# Patient Record
Sex: Female | Born: 1955 | Race: White | Hispanic: No | Marital: Single | Smoking: Current every day smoker
Health system: Southern US, Community
[De-identification: ages and names within clinical notes are randomized; demographics above are authoritative.]

## PROBLEM LIST (undated history)

## (undated) DIAGNOSIS — H269 Unspecified cataract: Secondary | ICD-10-CM

## (undated) DIAGNOSIS — F32A Depression, unspecified: Secondary | ICD-10-CM

## (undated) DIAGNOSIS — R011 Cardiac murmur, unspecified: Secondary | ICD-10-CM

## (undated) DIAGNOSIS — T7840XA Allergy, unspecified, initial encounter: Secondary | ICD-10-CM

## (undated) DIAGNOSIS — M797 Fibromyalgia: Secondary | ICD-10-CM

## (undated) DIAGNOSIS — N6019 Diffuse cystic mastopathy of unspecified breast: Secondary | ICD-10-CM

## (undated) DIAGNOSIS — M1990 Unspecified osteoarthritis, unspecified site: Secondary | ICD-10-CM

## (undated) DIAGNOSIS — K219 Gastro-esophageal reflux disease without esophagitis: Secondary | ICD-10-CM

## (undated) DIAGNOSIS — Z87442 Personal history of urinary calculi: Secondary | ICD-10-CM

## (undated) DIAGNOSIS — I10 Essential (primary) hypertension: Secondary | ICD-10-CM

## (undated) DIAGNOSIS — F329 Major depressive disorder, single episode, unspecified: Secondary | ICD-10-CM

## (undated) DIAGNOSIS — F419 Anxiety disorder, unspecified: Secondary | ICD-10-CM

## (undated) DIAGNOSIS — J45909 Unspecified asthma, uncomplicated: Secondary | ICD-10-CM

## (undated) DIAGNOSIS — R5382 Chronic fatigue, unspecified: Secondary | ICD-10-CM

## (undated) DIAGNOSIS — Z1331 Encounter for screening for depression: Secondary | ICD-10-CM

## (undated) HISTORY — DX: Chronic fatigue, unspecified: R53.82

## (undated) HISTORY — DX: Depression, unspecified: F32.A

## (undated) HISTORY — PX: SPINE SURGERY: SHX786

## (undated) HISTORY — DX: Essential (primary) hypertension: I10

## (undated) HISTORY — DX: Unspecified asthma, uncomplicated: J45.909

## (undated) HISTORY — DX: Allergy, unspecified, initial encounter: T78.40XA

## (undated) HISTORY — DX: Fibromyalgia: M79.7

## (undated) HISTORY — DX: Unspecified osteoarthritis, unspecified site: M19.90

## (undated) HISTORY — DX: Diffuse cystic mastopathy of unspecified breast: N60.19

## (undated) HISTORY — DX: Encounter for screening for depression: Z13.31

## (undated) HISTORY — DX: Gastro-esophageal reflux disease without esophagitis: K21.9

## (undated) HISTORY — PX: TYMPANOSTOMY TUBE PLACEMENT: SHX32

## (undated) HISTORY — DX: Major depressive disorder, single episode, unspecified: F32.9

## (undated) HISTORY — DX: Unspecified cataract: H26.9

## (undated) HISTORY — PX: BACK SURGERY: SHX140

## (undated) HISTORY — PX: CHOLECYSTECTOMY: SHX55

## (undated) HISTORY — PX: BREAST BIOPSY: SHX20

## (undated) HISTORY — PX: NASAL SINUS SURGERY: SHX719

## (undated) HISTORY — PX: OTHER SURGICAL HISTORY: SHX169

## (undated) HISTORY — PX: ABDOMINAL HYSTERECTOMY: SHX81

---

## 2003-10-07 ENCOUNTER — Encounter
Admission: RE | Admit: 2003-10-07 | Discharge: 2004-01-05 | Payer: Self-pay | Admitting: Physical Medicine and Rehabilitation

## 2004-01-29 ENCOUNTER — Encounter
Admission: RE | Admit: 2004-01-29 | Discharge: 2004-04-19 | Payer: Self-pay | Admitting: Physical Medicine and Rehabilitation

## 2004-02-02 ENCOUNTER — Ambulatory Visit: Payer: Self-pay | Admitting: Anesthesiology

## 2004-02-16 ENCOUNTER — Ambulatory Visit: Payer: Self-pay | Admitting: Physical Medicine and Rehabilitation

## 2004-04-10 HISTORY — PX: COLONOSCOPY: SHX174

## 2004-04-19 ENCOUNTER — Encounter
Admission: RE | Admit: 2004-04-19 | Discharge: 2004-07-18 | Payer: Self-pay | Admitting: Physical Medicine and Rehabilitation

## 2004-05-06 ENCOUNTER — Ambulatory Visit: Payer: Self-pay | Admitting: Physical Medicine and Rehabilitation

## 2004-09-13 ENCOUNTER — Ambulatory Visit: Payer: Self-pay | Admitting: General Surgery

## 2005-09-28 ENCOUNTER — Ambulatory Visit: Payer: Self-pay | Admitting: General Surgery

## 2006-10-09 ENCOUNTER — Ambulatory Visit: Payer: Self-pay | Admitting: General Surgery

## 2007-02-15 ENCOUNTER — Emergency Department: Payer: Self-pay | Admitting: Emergency Medicine

## 2007-10-10 ENCOUNTER — Ambulatory Visit: Payer: Self-pay | Admitting: General Surgery

## 2008-10-13 ENCOUNTER — Ambulatory Visit: Payer: Self-pay | Admitting: General Surgery

## 2009-04-10 HISTORY — PX: EYE SURGERY: SHX253

## 2009-10-19 ENCOUNTER — Ambulatory Visit: Payer: Self-pay | Admitting: General Surgery

## 2009-10-25 ENCOUNTER — Ambulatory Visit: Payer: Self-pay | Admitting: Ophthalmology

## 2009-11-29 ENCOUNTER — Ambulatory Visit: Payer: Self-pay | Admitting: Ophthalmology

## 2010-08-05 ENCOUNTER — Ambulatory Visit: Payer: Self-pay | Admitting: Specialist

## 2010-08-15 ENCOUNTER — Other Ambulatory Visit (HOSPITAL_COMMUNITY): Payer: Self-pay

## 2010-08-23 ENCOUNTER — Inpatient Hospital Stay (HOSPITAL_COMMUNITY): Payer: Medicare Other

## 2010-08-23 ENCOUNTER — Inpatient Hospital Stay (HOSPITAL_COMMUNITY)
Admission: RE | Admit: 2010-08-23 | Discharge: 2010-08-25 | DRG: 460 | Disposition: A | Discharge status: Home / Self Care | Payer: Medicare Other | Source: Ambulatory Visit | Attending: Neurosurgery | Admitting: Neurosurgery

## 2010-08-23 DIAGNOSIS — M47817 Spondylosis without myelopathy or radiculopathy, lumbosacral region: Secondary | ICD-10-CM | POA: Diagnosis present

## 2010-08-23 DIAGNOSIS — F329 Major depressive disorder, single episode, unspecified: Secondary | ICD-10-CM | POA: Diagnosis present

## 2010-08-23 DIAGNOSIS — M5126 Other intervertebral disc displacement, lumbar region: Principal | ICD-10-CM | POA: Diagnosis present

## 2010-08-23 DIAGNOSIS — F3289 Other specified depressive episodes: Secondary | ICD-10-CM | POA: Diagnosis present

## 2010-08-23 DIAGNOSIS — F17200 Nicotine dependence, unspecified, uncomplicated: Secondary | ICD-10-CM | POA: Diagnosis present

## 2010-08-23 DIAGNOSIS — Z79899 Other long term (current) drug therapy: Secondary | ICD-10-CM

## 2010-08-23 DIAGNOSIS — IMO0001 Reserved for inherently not codable concepts without codable children: Secondary | ICD-10-CM | POA: Diagnosis present

## 2010-08-23 LAB — CBC
HCT: 53.4 % — ABNORMAL HIGH (ref 36.0–46.0)
Hemoglobin: 19.7 g/dL — ABNORMAL HIGH (ref 12.0–15.0)
MCH: 33.6 pg (ref 26.0–34.0)
MCHC: 36.9 g/dL — ABNORMAL HIGH (ref 30.0–36.0)
MCV: 91.1 fL (ref 78.0–100.0)
Platelets: 233 10*3/uL (ref 150–400)
RBC: 5.86 MIL/uL — ABNORMAL HIGH (ref 3.87–5.11)
RDW: 13 % (ref 11.5–15.5)
WBC: 9.9 10*3/uL (ref 4.0–10.5)

## 2010-08-23 LAB — SURGICAL PCR SCREEN
MRSA, PCR: NEGATIVE
Staphylococcus aureus: NEGATIVE

## 2010-08-23 LAB — TYPE AND SCREEN
ABO/RH(D): A POS
Antibody Screen: NEGATIVE

## 2010-08-23 LAB — ABO/RH
ABO/RH(D): A POS
Weak D: POSITIVE

## 2010-09-08 NOTE — Op Note (Signed)
NAME:  April Price, April Price            ACCOUNT NO.:  [id number]  MEDICAL RECORD NO.:  [id number]           PATIENT TYPE:  I  LOCATION:  3025                         FACILITY:  [facility]  PHYSICIAN:  Reinaldo Meeker, M.D. DATE OF BIRTH:  [birth year]  DATE OF PROCEDURE:  08/23/2010 DATE OF DISCHARGE:                              OPERATIVE REPORT   PREOPERATIVE DIAGNOSIS:  Herniated disk and spondylosis, L5-S1.  POSTOPERATIVE DIAGNOSIS:  Herniated disk and spondylosis, L5-S1.  PROCEDURE: 1. Left L5-S1 microdiskectomy followed by left L5-S1 transverse lumbar     interbody fusion followed by left pedicle screw instrumentation     followed by left L5-S1 posterolateral fusion followed by right L5-     S1 percutaneous pedicle screw fixation. 2. Decompression of L5 and S1 nerve roots more so than needed for     transverse lumbar interbody fusion.  SURGEON:  Reinaldo Meeker, MD  ASSISTANT:  Kathaleen Maser. Pool, MD.  PROCEDURE IN DETAIL:  After placing in the prone position, the patient's back was prepped and draped in the usual sterile fashion.  Localizing fluoroscopy was used prior to incision to identify the appropriate level.  Midline incision was made above the spinous processes of L5 and S1.  Using Bovie cutting current, the incision was carried down the spinous processes.  Subperiosteal dissection then carried out on the left-sided spinous processes and lamina, facet joints, far lateral region to identify the transverse process of L5 and the far lateral aspect of the sacrum.  X-rays showed approach to the appropriate level. A self-retaining retractor was placed for exposure.  On the patient's left side, laminotomy was performed by removing basically the entire inferior L5 lamina on that side which was free floating due to the spondylosis.  Generous medial facetectomy was then performed at the superior facet of S1 and then the superior third of the S1 segment were removed.  Ligamentum flavum  was removed in piecemeal fashion.  L5 and S1 nerve roots were both identified and followed out their foramen.  At this time, microdiskectomy was carried on this side.  A huge disk herniation was noted beneath the nerve root.  This was incised and thoroughly cleaned out with pituitary rongeurs and curettes until the disk space was clean.  Thorough decompression was carried out.  At the same time, good care was taken to avoid injury to the neural elements. This was successfully done.  At this time, open pedicle screw fixation was carried out on the patient's left side at L5-S1.  Drill hole entry points were placed followed by passing of a pedicle awl followed by tapping with the 5.5-mm tap and placing a 6.5- x 40-mm screw with L5 and 6.5- x 30-mm screw at S1.  These were followed in good position under AP and lateral fluoroscopy.  Prior to attaching the rod on this side, we did the interbody fusion.  We prepared the disk with a variety of instruments to decorticate.  Prior to placing the interbody spacer of a 10-mm size, impacted EquivaBone and autologous bone deep within the interspace for interbody fusion.  We then packed the cage without difficulty and fluoroscopy  showed it to be in good position.  We then decorticated the far lateral region on the left at L5-S1, placed a mixture of autologous bone and EquivaBone for posterolateral fusion.  We then attached the rod, secured it to the top of the screws with torque and counter-torque for the final tightening.  Fluoroscopy showed these screws to be in excellent position along with the interbody device.  We then irrigated the wound copiously and placed Gelfoam over the dura but did not close it thoroughly.  We then placed percutaneous pedicle screws on the patient's right side.  We used entry points just lateral to the pedicle at L5 and S1, passed Jamshidi needle through the mid pedicle from a lateral to medial direction.  We then placed a  guidewire to remove the Jamshidi needle.  The guidewires were placed.  We attached the two incisions, carried it down through the fascia.  Starting at S1, we did sequential dilation and then tapped over the guidewire using the 5.5-mm tap.  We then placed a 6.5- x 35-mm screw at the S1 segment on the right.  We then did similar procedure at L5.  At this level, we placed a 6.5- x 40-mm screw with the towers attached.  We then passed a 35-mm rod down through the towers, secured it to the top of the top- loading nuts.  Then, did torque and counter-torque to remove the towers. Final fluoroscopy showed excellent placing of the screws, rods, interbody device on AP and lateral fluoroscopy.  We irrigated the wound once more on the patient's left side and then closed all wounds with interrupted Vicryl on the muscle, fascia, subcutaneous, and subcuticular tissues and placed staples on the skin.  Sterile dressings were then applied.  The patient was extubated, taken to recovery room in stable condition.          ______________________________ Reinaldo Meeker, M.D.     ROK/MEDQ  D:  08/23/2010  T:  08/24/2010  Job:  [id number]  Electronically Signed by Aliene Beams M.D. on 09/08/2010 05:02:02 PM

## 2010-10-03 ENCOUNTER — Ambulatory Visit
Admission: RE | Admit: 2010-10-03 | Discharge: 2010-10-03 | Disposition: A | Discharge status: Home / Self Care | Payer: Medicare Other | Source: Ambulatory Visit | Attending: Neurosurgery | Admitting: Neurosurgery

## 2010-10-03 ENCOUNTER — Other Ambulatory Visit: Payer: Self-pay | Admitting: Neurosurgery

## 2010-10-03 DIAGNOSIS — M47817 Spondylosis without myelopathy or radiculopathy, lumbosacral region: Secondary | ICD-10-CM

## 2010-10-03 DIAGNOSIS — M5126 Other intervertebral disc displacement, lumbar region: Secondary | ICD-10-CM

## 2010-10-24 ENCOUNTER — Ambulatory Visit: Payer: Self-pay | Admitting: General Surgery

## 2010-11-21 ENCOUNTER — Other Ambulatory Visit: Payer: Self-pay | Admitting: Neurosurgery

## 2010-11-21 ENCOUNTER — Ambulatory Visit
Admission: RE | Admit: 2010-11-21 | Discharge: 2010-11-21 | Disposition: A | Discharge status: Home / Self Care | Payer: Medicare Other | Source: Ambulatory Visit | Attending: Neurosurgery | Admitting: Neurosurgery

## 2010-11-21 DIAGNOSIS — M5126 Other intervertebral disc displacement, lumbar region: Secondary | ICD-10-CM

## 2010-11-21 DIAGNOSIS — Q762 Congenital spondylolisthesis: Secondary | ICD-10-CM

## 2010-11-21 DIAGNOSIS — M533 Sacrococcygeal disorders, not elsewhere classified: Secondary | ICD-10-CM

## 2011-01-04 ENCOUNTER — Emergency Department: Payer: Self-pay | Admitting: *Deleted

## 2011-01-12 ENCOUNTER — Ambulatory Visit (INDEPENDENT_AMBULATORY_CARE_PROVIDER_SITE_OTHER): Payer: Medicare Other | Admitting: Family Medicine

## 2011-01-12 ENCOUNTER — Encounter: Payer: Self-pay | Admitting: Family Medicine

## 2011-01-12 VITALS — BP 167/93 | HR 117 | Temp 98.2°F | Ht 62.0 in | Wt 167.0 lb

## 2011-01-12 DIAGNOSIS — Z23 Encounter for immunization: Secondary | ICD-10-CM

## 2011-01-12 DIAGNOSIS — R030 Elevated blood-pressure reading, without diagnosis of hypertension: Secondary | ICD-10-CM

## 2011-01-12 DIAGNOSIS — M797 Fibromyalgia: Secondary | ICD-10-CM | POA: Insufficient documentation

## 2011-01-12 DIAGNOSIS — J309 Allergic rhinitis, unspecified: Secondary | ICD-10-CM

## 2011-01-12 DIAGNOSIS — G8929 Other chronic pain: Secondary | ICD-10-CM

## 2011-01-12 DIAGNOSIS — F329 Major depressive disorder, single episode, unspecified: Secondary | ICD-10-CM

## 2011-01-12 DIAGNOSIS — F4320 Adjustment disorder, unspecified: Secondary | ICD-10-CM

## 2011-01-12 DIAGNOSIS — J302 Other seasonal allergic rhinitis: Secondary | ICD-10-CM

## 2011-01-12 DIAGNOSIS — F17200 Nicotine dependence, unspecified, uncomplicated: Secondary | ICD-10-CM

## 2011-01-12 DIAGNOSIS — F32A Depression, unspecified: Secondary | ICD-10-CM

## 2011-01-12 DIAGNOSIS — IMO0001 Reserved for inherently not codable concepts without codable children: Secondary | ICD-10-CM

## 2011-01-12 DIAGNOSIS — Z9109 Other allergy status, other than to drugs and biological substances: Secondary | ICD-10-CM

## 2011-01-12 DIAGNOSIS — M549 Dorsalgia, unspecified: Secondary | ICD-10-CM

## 2011-01-12 DIAGNOSIS — Z889 Allergy status to unspecified drugs, medicaments and biological substances status: Secondary | ICD-10-CM

## 2011-01-12 DIAGNOSIS — R5382 Chronic fatigue, unspecified: Secondary | ICD-10-CM

## 2011-01-12 MED ORDER — AMBULATORY NON FORMULARY MEDICATION: 1.0000 | Freq: Once | Status: DC

## 2011-01-12 MED ORDER — FENTANYL 25 MCG/HR TD PT72: 1.0000 | MEDICATED_PATCH | TRANSDERMAL | Status: DC

## 2011-01-12 MED ORDER — TRAZODONE HCL 300 MG PO TABS: 300.0000 mg | ORAL_TABLET | Freq: Every day | ORAL | Status: DC

## 2011-01-12 MED ORDER — TRAMADOL HCL 50 MG PO TABS: ORAL_TABLET | ORAL | Status: DC

## 2011-01-12 MED ORDER — BUDESONIDE 32 MCG/ACT NA SUSP: 1.0000 | Freq: Every day | NASAL | Status: DC

## 2011-01-12 MED ORDER — CARISOPRODOL 350 MG PO TABS: 350.0000 mg | ORAL_TABLET | Freq: Four times a day (QID) | ORAL | Status: DC | PRN

## 2011-01-12 MED ORDER — EPINEPHRINE 0.15 MG/0.3ML IJ DEVI: 0.1500 mg | INTRAMUSCULAR | Status: DC | PRN

## 2011-01-12 NOTE — Patient Instructions (Signed)
Patient will be started on trazodone and titrated  Continue w/psychiatric conciling Return in 2-4 for follow up  Return next month for triggerpoint injections Stop smoking  Make sure flu shot is given at usual place and zooster vaccination given Renewal of tramadol ,soma and duragesic

## 2011-01-13 ENCOUNTER — Telehealth: Payer: Self-pay | Admitting: Family Medicine

## 2011-01-13 DIAGNOSIS — Z889 Allergy status to unspecified drugs, medicaments and biological substances status: Secondary | ICD-10-CM | POA: Insufficient documentation

## 2011-01-13 DIAGNOSIS — F329 Major depressive disorder, single episode, unspecified: Secondary | ICD-10-CM | POA: Insufficient documentation

## 2011-01-13 DIAGNOSIS — F419 Anxiety disorder, unspecified: Secondary | ICD-10-CM | POA: Insufficient documentation

## 2011-01-13 DIAGNOSIS — F4320 Adjustment disorder, unspecified: Secondary | ICD-10-CM | POA: Insufficient documentation

## 2011-01-13 DIAGNOSIS — F32A Depression, unspecified: Secondary | ICD-10-CM | POA: Insufficient documentation

## 2011-01-13 NOTE — Telephone Encounter (Signed)
I am not sure what is the concern since she had been on vicoprophen for a number of years but when she had back surgery this spring all antiinflammatories were stopped by her neurosurgeon,. She was on dilaudid I believe filled by me and the neurosurgeon. Check w/MS Marlyne Beards please but I have no problem w/ the duragesic.

## 2011-01-13 NOTE — Telephone Encounter (Signed)
Cordelia Pen called from [employer] and said pt insurance is going to require a prior auth on pt rhinocort Aqua medication.  They need our fax number to send prior auth form. Plan:  Notified Cordelia Pen and fax number given and will fax form so we can work on prior auth for the pt. Jarvis Newcomer, LPN [facility]

## 2011-01-13 NOTE — Telephone Encounter (Signed)
[facility] called stating they have not filled any pain meds for Pt since May and would not fill duragesic patches bc she is no longer opiate dependant. I called and verified w/ Pt and she stated that she had not been on these meds in months bc she had back surgery in June. Please advise.

## 2011-01-13 NOTE — Progress Notes (Signed)
Subjective:    Patient ID: April Price, female    DOB: 05/05/1955, 55 y.o.   MRN: [id number]  HPI #1 Fibromyalgia/and chronic fatigue  Over the years we have followed her for this illness She is on tramadol Soma,and fentanyl patch. Due to her recent back surgery her mobic has been stopped. #2 depression #3 situation disturbances Her life has been under a lot of stress due to her adopted daughter mental illness and recent recurrent hospitalizations. She has stated that she was going to be released as guardian by the court but has not done it yet. She has had symptoms of depression and difficulty sleeping and reports only a 3 hrs-4 hrs sleep time. #4 Recent back surgery #5 Recent severe reaction to penicillin requiring strarting prednisone and zantac. Her epipen was renewed then. #5 Immunization needs. She gets her flu vaccine at her drug store but wants the zooster vaccine as well. Review of Systems  Respiratory: Negative for chest tightness and shortness of breath.   Cardiovascular: Negative for chest pain.  Musculoskeletal: Positive for back pain, joint swelling and gait problem.  Psychiatric/Behavioral: Positive for sleep disturbance, dysphoric mood and decreased concentration. Negative for self-injury. The patient is nervous/anxious.        BP 167/93  Pulse 117  Temp(Src) 98.2 F (36.8 C) (Oral)  Ht 5\' 2"  (1.575 m)  Wt 167 lb (75.751 kg)  BMI 30.54 kg/m2  SpO2 96% Allergies  Allergen Reactions  . Penicillins   . Tetanus Toxoids   . Tylenol (Acetaminophen)    History   Social History  . Marital Status: Single    Spouse Name: N/A    Number of Children: N/A  . Years of Education: N/A   Occupational History  . Not on file.   Social History Main Topics  . Smoking status: Current Everyday Smoker -- 30 years    Types: Cigarettes  . Smokeless tobacco: Not on file  . Alcohol Use: No  . Drug Use: No  . Sexually Active: Not on file   Other Topics Concern  . Not on  file   Social History Narrative  . No narrative on file     Objective:   Physical Exam  Constitutional: She is oriented to person, place, and time. She appears well-developed and well-nourished.  HENT:  Head: Normocephalic.  Neck: Neck supple.  Cardiovascular: Normal rate, regular rhythm and normal heart sounds.   Pulmonary/Chest: Effort normal. No respiratory distress. She has wheezes.  Musculoskeletal: Normal range of motion.       Healing surgical scar presen over lower back  Neurological: She is alert and oriented to person, place, and time. She has normal reflexes.  Skin: Skin is warm and dry. No erythema.  Psychiatric:       Tearful at times          Assessment & Plan:  Patient will be started on trazodone and titrated  Continue w/psychiatric conciling Return in 2-4 for follow up  Return next month for triggerpoint injections Stop smoking  Make sure flu shot is given at usual place and zooster vaccination given Renewal of tramadol ,soma and duragesic patch Will need to recheck BP next visit Scrip for Varicella given  Outpatient Encounter Prescriptions as of 01/12/2011  Medication Sig Dispense Refill  . budesonide (RHINOCORT AQUA) 32 MCG/ACT nasal spray Place 1 spray into the nose daily.  1 Bottle  6  . carisoprodol (SOMA) 350 MG tablet Take 1 tablet (350 mg total) by mouth  4 (four) times daily as needed.  120 tablet  5  . clindamycin (CLEOCIN) 300 MG capsule Take 300 mg by mouth every 8 (eight) hours.        Marland Kitchen EPINEPHrine (EPIPEN JR) 0.15 MG/0.3ML injection Inject 0.3 mLs (0.15 mg total) into the muscle as needed.  1 each  6  . fentaNYL (DURAGESIC - DOSED MCG/HR) 25 MCG/HR Place 1 patch (25 mcg total) onto the skin every 3 (three) days.  10 patch  0  . predniSONE (DELTASONE) 10 MG tablet Take 10 mg by mouth as directed.        . ranitidine (ZANTAC) 150 MG tablet Take 150 mg by mouth 2 (two) times daily.        . traMADol (ULTRAM) 50 MG tablet 1-2 tablets po q 8hrs  prn  120 tablet  6  . DISCONTD: budesonide (RHINOCORT AQUA) 32 MCG/ACT nasal spray Place 1 spray into the nose daily.        Marland Kitchen DISCONTD: carisoprodol (SOMA) 350 MG tablet Take 350 mg by mouth 4 (four) times daily as needed.        Marland Kitchen DISCONTD: EPINEPHrine (EPIPEN JR) 0.15 MG/0.3ML injection Inject 0.15 mg into the muscle as needed.        Marland Kitchen DISCONTD: fentaNYL (DURAGESIC - DOSED MCG/HR) 25 MCG/HR Place 1 patch onto the skin every 3 (three) days.        Marland Kitchen DISCONTD: traMADol (ULTRAM) 50 MG tablet Take 50 mg by mouth every 8 (eight) hours as needed.        . AMBULATORY NON FORMULARY MEDICATION Inject 1 vial as directed once. Medication Name: zooster   1 vial  0  . trazodone (DESYREL) 300 MG tablet Take 1 tablet (300 mg total) by mouth at bedtime. Start off w/ a third of a tablet and increase by 1/3 every 2 weeks as needed  30 tablet  6

## 2011-01-26 NOTE — Telephone Encounter (Signed)
Looks like this message regarding pain med was sent to Payton Spark, CMA. Jarvis Newcomer, LPN [facility]

## 2011-01-31 ENCOUNTER — Encounter: Payer: Self-pay | Admitting: Family Medicine

## 2011-01-31 ENCOUNTER — Ambulatory Visit (INDEPENDENT_AMBULATORY_CARE_PROVIDER_SITE_OTHER): Payer: Medicare Other | Admitting: Family Medicine

## 2011-01-31 VITALS — BP 153/91 | HR 98 | Ht 62.5 in | Wt 169.0 lb

## 2011-01-31 DIAGNOSIS — M549 Dorsalgia, unspecified: Secondary | ICD-10-CM

## 2011-01-31 DIAGNOSIS — M542 Cervicalgia: Secondary | ICD-10-CM

## 2011-01-31 DIAGNOSIS — G8929 Other chronic pain: Secondary | ICD-10-CM

## 2011-01-31 DIAGNOSIS — M797 Fibromyalgia: Secondary | ICD-10-CM

## 2011-01-31 DIAGNOSIS — M62830 Muscle spasm of back: Secondary | ICD-10-CM | POA: Insufficient documentation

## 2011-01-31 DIAGNOSIS — R030 Elevated blood-pressure reading, without diagnosis of hypertension: Secondary | ICD-10-CM

## 2011-01-31 DIAGNOSIS — M5380 Other specified dorsopathies, site unspecified: Secondary | ICD-10-CM

## 2011-01-31 DIAGNOSIS — IMO0001 Reserved for inherently not codable concepts without codable children: Secondary | ICD-10-CM

## 2011-01-31 MED ORDER — FENTANYL 25 MCG/HR TD PT72: 1.0000 | MEDICATED_PATCH | TRANSDERMAL | Status: DC

## 2011-01-31 NOTE — Progress Notes (Signed)
  Subjective:    Patient ID: April Price, female    DOB: December 01, 1955, 55 y.o.   MRN: [id number]  Back Pain This is a chronic problem. The current episode started more than 1 year ago. The problem occurs constantly. The problem has been gradually worsening (despiyte surgery but no trigger point injections) since onset. The pain is present in the lumbar spine and sacro-iliac. The quality of the pain is described as stabbing. The pain does not radiate. The pain is at a severity of 7/10. The pain is moderate. The pain is the same all the time. The symptoms are aggravated by bending, coughing, position, standing, twisting and stress. Stiffness is present all day. Associated symptoms include headaches, pelvic pain, tingling and weakness. Pertinent negatives include no chest pain. Risk factors include sedentary lifestyle, menopause, obesity, history of steroid use and lack of exercise. She has tried chiropractic manipulation, muscle relaxant, ice, heat, analgesics and bed rest (unable t use NSAIDS  1 yea after surgery due to grafts) for the symptoms. The treatment provided moderate relief.  Trazadone 300 not effective w/sleep yet but she is only on 1/2  Patient has had chronic back pain she has used all of her fentynel patches and any extra dilaudid pills the last 2 weeks. She could not get the patch filled because of concern w/opite naivety. She has assured me that she has ben using what pain medication she has at home. But she uses something daily.  Now since she is able to drive post surgery she finds that the dilaudid makes her sleepy while the fentynel does not. Review of Systems  Cardiovascular: Negative for chest pain.  Genitourinary: Positive for pelvic pain.  Musculoskeletal: Positive for back pain and arthralgias.  Neurological: Positive for tingling, weakness and headaches.       Tingling feet and nubness has continued post back surgery  All other systems reviewed and are negative.   BP  153/91 p98 HT 5 2.5 W169   Objective:   Physical Exam  Constitutional: She is oriented to person, place, and time. She appears well-developed and well-nourished.  Cardiovascular: Normal rate and regular rhythm.   Musculoskeletal: She exhibits tenderness (tenderness over C spine and upper back muscles as well as lower back).  Neurological: She is alert and oriented to person, place, and time.  Skin: Skin is warm and dry.          Assessment & Plan:  She feels elevated BP is due to stress Will have her monitor and track her BP Will renew fentynel patch 25 mcg q 3  day

## 2011-01-31 NOTE — Patient Instructions (Signed)
Back Pain, Adult Low back pain is very common. About 1 in 5 people have back pain.The cause of low back pain is rarely dangerous. The pain often gets better over time.About half of people with a sudden onset of back pain feel better in just 2 weeks. About 8 in 10 people feel better by 6 weeks.  CAUSES Some common causes of back pain include:  Strain of the muscles or ligaments supporting the spine.   Wear and tear (degeneration) of the spinal discs.   Arthritis.   Direct injury to the back.  DIAGNOSIS Most of the time, the direct cause of low back pain is not known.However, back pain can be treated effectively even when the exact cause of the pain is unknown.Answering your caregiver's questions about your overall health and symptoms is one of the most accurate ways to make sure the cause of your pain is not dangerous. If your caregiver needs more information, he or she may order lab work or imaging tests (X-rays or MRIs).However, even if imaging tests show changes in your back, this usually does not require surgery. HOME CARE INSTRUCTIONS For many people, back pain returns.Since low back pain is rarely dangerous, it is often a condition that people can learn to manageon their own.   Remain active. It is stressful on the back to sit or stand in one place. Do not sit, drive, or stand in one place for more than 30 minutes at a time. Take short walks on level surfaces as soon as pain allows.Try to increase the length of time you walk each day.   Do not stay in bed.Resting more than 1 or 2 days can delay your recovery.   Do not avoid exercise or work.Your body is made to move.It is not dangerous to be active, even though your back may hurt.Your back will likely heal faster if you return to being active before your pain is gone.   Pay attention to your body when you bend and lift. Many people have less discomfortwhen lifting if they bend their knees, keep the load close to their  bodies,and avoid twisting. Often, the most comfortable positions are those that put less stress on your recovering back.   Find a comfortable position to sleep. Use a firm mattress and lie on your side with your knees slightly bent. If you lie on your back, put a pillow under your knees.   Only take over-the-counter or prescription medicines as directed by your caregiver. Over-the-counter medicines to reduce pain and inflammation are often the most helpful.Your caregiver may prescribe muscle relaxant drugs.These medicines help dull your pain so you can more quickly return to your normal activities and healthy exercise.   Put ice on the injured area.   Put ice in a plastic bag.   Place a towel between your skin and the bag.   Leave the ice on for 15 to 20 minutes, 3 to 4 times a day for the first 2 to 3 days. After that, ice and heat may be alternated to reduce pain and spasms.   Ask your caregiver about trying back exercises and gentle massage. This may be of some benefit.   Avoid feeling anxious or stressed.Stress increases muscle tension and can worsen back pain.It is important to recognize when you are anxious or stressed and learn ways to manage it.Exercise is a great option.  SEEK MEDICAL CARE IF:  You have pain that is not relieved with rest or medicine.   You have   pain that does not improve in 1 week.   You have new symptoms.   You are generally not feeling well.  SEEK IMMEDIATE MEDICAL CARE IF:   You have pain that radiates from your back into your legs.   You develop new bowel or bladder control problems.   You have unusual weakness or numbness in your arms or legs.   You develop nausea or vomiting.   You develop abdominal pain.   You feel faint.  Document Released: 03/27/2005 Document Revised: 12/07/2010 Document Reviewed: 08/15/2010 [facility] Patient Information 2012 [place], [place]. Fibromyalgia Fibromyalgia is a disorder that is often misunderstood. It is  associated with muscular pains and tenderness that comes and goes. It is often associated with fatigue and sleep disturbances. Though it tends to be long-lasting, fibromyalgia is not life-threatening. CAUSES  The exact cause of fibromyalgia is unknown. People with certain gene types are predisposed to developing fibromyalgia and other conditions. Certain factors can play a role as triggers, such as: Spine disorders.  Arthritis.  Severe injury (trauma) and other physical stressors.  Emotional stressors.  SYMPTOMS  The main symptom is pain and stiffness in the muscles and joints, which can vary over time.  Sleep and fatigue problems.  Other related symptoms may include: Bowel and bladder problems.  Headaches.  Visual problems.  Problems with odors and noises.  Depression or mood changes.  Painful periods (dysmenorrhea).  Dryness of the skin or eyes.  DIAGNOSIS  There are no specific tests for diagnosing fibromyalgia. Patients can be diagnosed accurately from the specific symptoms they have. The diagnosis is made by determining that nothing else is causing the problems. TREATMENT  There is no cure. Management includes medicines and an active, healthy lifestyle. The goal is to enhance physical fitness, decrease pain, and improve sleep. HOME CARE INSTRUCTIONS  Only take over-the-counter or prescription medicines as directed by your caregiver. Sleeping pills, tranquilizers, and pain medicines may make your problems worse.  Low-impact aerobic exercise is very important and advised for treatment. At first, it may seem to make pain worse. Gradually increasing your tolerance will overcome this feeling.  Learning relaxation techniques and how to control stress will help you. Biofeedback, visual imagery, hypnosis, muscle relaxation, yoga, and meditation are all options.  Anti-inflammatory medicines and physical therapy may provide short-term help.  Acupuncture or massage treatments may help.  Take  muscle relaxant medicines as suggested by your caregiver.  Avoid stressful situations.  Plan a healthy lifestyle. This includes your diet, sleep, rest, exercise, and friends.  Find and practice a hobby you enjoy.  Join a fibromyalgia support group for interaction, ideas, and sharing advice. This may be helpful.  SEEK MEDICAL CARE IF:  You are not having good results or improvement from your treatment. FOR MORE INFORMATION  National Fibromyalgia Association: www.fmaware.org Arthritis Foundation: www.arthritis.org Document Released: 03/27/2005 Document Revised: 12/07/2010 Document Reviewed: 07/07/2009 [facility] Patient Information 2012 [place], [place].   Smoking Cessation This document explains the best ways for you to quit smoking and new treatments to help. It lists new medicines that can double or triple your chances of quitting and quitting for good. It also considers ways to avoid relapses and concerns you may have about quitting, including weight gain. NICOTINE: A POWERFUL ADDICTION If you have tried to quit smoking, you know how hard it can be. It is hard because nicotine is a very addictive drug. For some people, it can be as addictive as heroin or cocaine. Usually, people make 2 or 3  tries, or more, before finally being able to quit. Each time you try to quit, you can learn about what helps and what hurts. Quitting takes hard work and a lot of effort, but you can quit smoking. QUITTING SMOKING IS ONE OF THE MOST IMPORTANT THINGS YOU WILL EVER DO.  You will live longer, feel better, and live better.   The impact on your body of quitting smoking is felt almost immediately:   Within 20 minutes, blood pressure decreases. Pulse returns to its normal level.   After 8 hours, carbon monoxide levels in the blood return to normal. Oxygen level increases.   After 24 hours, chance of heart attack starts to decrease. Breath, hair, and body stop smelling like smoke.   After 48 hours, damaged  nerve endings begin to recover. Sense of taste and smell improve.   After 72 hours, the body is virtually free of nicotine. Bronchial tubes relax and breathing becomes easier.   After 2 to 12 weeks, lungs can hold more air. Exercise becomes easier and circulation improves.   Quitting will reduce your risk of having a heart attack, stroke, cancer, or lung disease:   After 1 year, the risk of coronary heart disease is cut in half.   After 5 years, the risk of stroke falls to the same as a nonsmoker.   After 10 years, the risk of lung cancer is cut in half and the risk of other cancers decreases significantly.   After 15 years, the risk of coronary heart disease drops, usually to the level of a nonsmoker.   If you are pregnant, quitting smoking will improve your chances of having a healthy baby.   The people you live with, especially your children, will be healthier.   You will have extra money to spend on things other than cigarettes.  FIVE KEYS TO QUITTING Studies have shown that these 5 steps will help you quit smoking and quit for good. You have the best chances of quitting if you use them together: 1. Get ready.  2. Get support and encouragement.  3. Learn new skills and behaviors.  4. Get medicine to reduce your nicotine addiction and use it correctly.  5. Be prepared for relapse or difficult situations. Be determined to continue trying to quit, even if you do not succeed at first.  1. GET READY  Set a quit date.   Change your environment.   Get rid of ALL cigarettes, ashtrays, matches, and lighters in your home, car, and place of work.   Do not let people smoke in your home.   Review your past attempts to quit. Think about what worked and what did not.   Once you quit, do not smoke. NOT EVEN A PUFF!  2. GET SUPPORT AND ENCOURAGEMENT Studies have shown that you have a better chance of being successful if you have help. You can get support in many ways.  Tell your  family, friends, and coworkers that you are going to quit and need their support. Ask them not to smoke around you.   Talk to your caregivers (doctor, dentist, nurse, pharmacist, psychologist, and/or smoking counselor).   Get individual, group, or telephone counseling and support. The more counseling you have, the better your chances are of quitting. Programs are available at [employer] and health centers. Call your local health department for information about programs in your area.   Spiritual beliefs and practices may help some smokers quit.   Quit meters are small computer  programs online or downloadable that keep track of quit statistics, such as amount of "quit-time," cigarettes not smoked, and money saved.   Many smokers find one or more of the many self-help books available useful in helping them quit and stay off tobacco.  3. LEARN NEW SKILLS AND BEHAVIORS  Try to distract yourself from urges to smoke. Talk to someone, go for a walk, or occupy your time with a task.   When you first try to quit, change your routine. Take a different route to work. Drink tea instead of coffee. Eat breakfast in a different place.   Do something to reduce your stress. Take a hot bath, exercise, or read a book.   Plan something enjoyable to do every day. Reward yourself for not smoking.   Explore interactive web-based programs that specialize in helping you quit.  4. GET MEDICINE AND USE IT CORRECTLY Medicines can help you stop smoking and decrease the urge to smoke. Combining medicine with the above behavioral methods and support can quadruple your chances of successfully quitting smoking. The U.[place]. Food and Drug Administration (FDA) has approved 7 medicines to help you quit smoking. These medicines fall into 3 categories.  Nicotine replacement therapy (delivers nicotine to your body without the negative effects and risks of smoking):   Nicotine gum: Available over-the-counter.   Nicotine  lozenges: Available over-the-counter.   Nicotine inhaler: Available by prescription.   Nicotine nasal spray: Available by prescription.   Nicotine skin patches (transdermal): Available by prescription and over-the-counter.   Antidepressant medicine (helps people abstain from smoking, but how this works is unknown):   Bupropion sustained-release (SR) tablets: Available by prescription.   Nicotinic receptor partial agonist (simulates the effect of nicotine in your brain):   Varenicline tartrate tablets: Available by prescription.   Ask your caregiver for advice about which medicines to use and how to use them. Carefully read the information on the package.   Everyone who is trying to quit may benefit from using a medicine. If you are pregnant or trying to become pregnant, nursing an infant, you are under age 34, or you smoke fewer than 10 cigarettes per day, talk to your caregiver before taking any nicotine replacement medicines.   You should stop using a nicotine replacement product and call your caregiver if you experience nausea, dizziness, weakness, vomiting, fast or irregular heartbeat, mouth problems with the lozenge or gum, or redness or swelling of the skin around the patch that does not go away.   Do not use any other product containing nicotine while using a nicotine replacement product.   Talk to your caregiver before using these products if you have diabetes, heart disease, asthma, stomach ulcers, you had a recent heart attack, you have high blood pressure that is not controlled with medicine, a history of irregular heartbeat, or you have been prescribed medicine to help you quit smoking.  5. BE PREPARED FOR RELAPSE OR DIFFICULT SITUATIONS  Most relapses occur within the first 3 months after quitting. Do not be discouraged if you start smoking again. Remember, most people try several times before they finally quit.   You may have symptoms of withdrawal because your body is used  to nicotine. You may crave cigarettes, be irritable, feel very hungry, cough often, get headaches, or have difficulty concentrating.   The withdrawal symptoms are only temporary. They are strongest when you first quit, but they will go away within 10 to 14 days.  Here are some difficult situations to watch  for:  Alcohol. Avoid drinking alcohol. Drinking lowers your chances of successfully quitting.   Caffeine. Try to reduce the amount of caffeine you consume. It also lowers your chances of successfully quitting.   Other smokers. Being around smoking can make you want to smoke. Avoid smokers.   Weight gain. Many smokers will gain weight when they quit, usually less than 10 pounds. Eat a healthy diet and stay active. Do not let weight gain distract you from your main goal, quitting smoking. Some medicines that help you quit smoking may also help delay weight gain. You can always lose the weight gained after you quit.   Bad mood or depression. There are a lot of ways to improve your mood other than smoking.  If you are having problems with any of these situations, talk to your caregiver. SPECIAL SITUATIONS AND CONDITIONS Studies suggest that everyone can quit smoking. Your situation or condition can give you a special reason to quit.  Pregnant women/new mothers: By quitting, you protect your baby's health and your own.   Hospitalized patients: By quitting, you reduce health problems and help healing.   Heart attack patients: By quitting, you reduce your risk of a second heart attack.   Lung, head, and neck cancer patients: By quitting, you reduce your chance of a second cancer.   Parents of children and adolescents: By quitting, you protect your children from illnesses caused by secondhand smoke.  QUESTIONS TO THINK ABOUT Think about the following questions before you try to stop smoking. You may want to talk about your answers with your caregiver.  Why do you want to quit?   If you  tried to quit in the past, what helped and what did not?   What will be the most difficult situations for you after you quit? How will you plan to handle them?   Who can help you through the tough times? Your family? Friends? Caregiver?   What pleasures do you get from smoking? What ways can you still get pleasure if you quit?  Here are some questions to ask your caregiver:  How can you help me to be successful at quitting?   What medicine do you think would be best for me and how should I take it?   What should I do if I need more help?   What is smoking withdrawal like? How can I get information on withdrawal?  Quitting takes hard work and a lot of effort, but you can quit smoking. FOR MORE INFORMATION  Smokefree.gov (http://www.davis-sullivan.com/) provides free, accurate, evidence-based information and professional assistance to help support the immediate and long-term needs of people trying to quit smoking. Document Released: 03/21/2001 Document Revised: 12/07/2010 Document Reviewed: 01/11/2009 [facility] Patient Information 2012 [place], [place].  Chronic Fatigue Syndrome Chronic Fatigue Syndrome is characterized by extreme fatigue that does not improve with rest. The cause of this condition is unknown.  SYMPTOMS  An unexplained dramatic loss of energy.   Muscle or joint soreness.   Severe weakness.   Frequent headaches.   Fever, sore throat, and swollen lymph glands.   Sleep problems.   Inability to concentrate.  Symptoms must usually be present for over 6 months before the diagnosis of chronic fatigue can be made. There is no diagnostic test for this disease. Many other diseases can cause similar symptoms. A complete medical evaluation is needed to be sure you do not have other medical problems causing your symptoms. There is no specific treatment for Chronic Fatigue Syndrome. Cognitive  behavioral therapy (similar to counseling) and/or a simple exercise regimen may be  beneficial. Get plenty of rest and avoid alcohol and other depressant drugs. Call your caregiver for follow up care as recommended. Document Released: 05/04/2004 Document Revised: 12/07/2010 Document Reviewed: 06/26/2008 [facility] Patient Information 2012 [place], [place].

## 2011-01-31 NOTE — Assessment & Plan Note (Signed)
Patient has had chronic back pain. She has been using what few fentyl p

## 2011-02-16 ENCOUNTER — Ambulatory Visit (INDEPENDENT_AMBULATORY_CARE_PROVIDER_SITE_OTHER): Payer: Medicare Other | Admitting: Family Medicine

## 2011-02-16 ENCOUNTER — Encounter: Payer: Self-pay | Admitting: Family Medicine

## 2011-02-16 VITALS — BP 133/85 | HR 92 | Ht 62.5 in | Wt 172.0 lb

## 2011-02-16 DIAGNOSIS — M797 Fibromyalgia: Secondary | ICD-10-CM

## 2011-02-16 DIAGNOSIS — Z23 Encounter for immunization: Secondary | ICD-10-CM

## 2011-02-16 DIAGNOSIS — IMO0001 Reserved for inherently not codable concepts without codable children: Secondary | ICD-10-CM

## 2011-02-16 NOTE — Patient Instructions (Signed)
Patient will return in 2 months for repeat injections

## 2011-02-16 NOTE — Progress Notes (Signed)
  Subjective:    Patient ID: April Price, female    DOB: 06-Nov-1955, 55 y.o.   MRN: [id number]  HPI Patient's here for trigger point injections she does have a history of fibromyalgia. #2 need for flu vaccination  Review of Systems    patient has unusual persistent and recurrent joint pain muscle aches and fatigue from her chronic fatigue. Objective:   Physical Exam  Constitutional: She is oriented to person, place, and time. She appears well-developed and well-nourished.  HENT:  Head: Normocephalic.  Neck:       Multiple triggerpoints on both sides and back and a thoracic and cervical spine.  Musculoskeletal: Normal range of motion.       Multiple trigger points over her back and neck  Neurological: She is alert and oriented to person, place, and time.  Skin: Skin is warm and dry.  Psychiatric: Her mood appears anxious. She exhibits a depressed mood.   BP 133/85  Pulse 92  Ht 5' 2.5" (1.588 m)  Wt 172 lb (78.019 kg)  BMI 30.96 kg/m2  SpO2 96%    Procedure note over several groups of muscles more than 5. Multiple injections were made using 2% Xylocaine with epinephrine and a Marcaine like agent and Toradol. Total of 30 mg of Toradol was added to 15 mL's of the 2% lidocaine and 15 ml of the Marcaine agent. Between 45 ML's and 60 mls were injected into the triggerpoints over her back marked improvement. Area was cleaned with Betadine and injection done under sterile technique patient procedure quite well with good results.    Assessment & Plan:  Return in 2 months for followup and possible repeat injection. #2 flu vaccine was also given today.

## 2011-04-18 DIAGNOSIS — F331 Major depressive disorder, recurrent, moderate: Secondary | ICD-10-CM | POA: Diagnosis not present

## 2011-04-20 ENCOUNTER — Telehealth: Payer: Self-pay | Admitting: Family Medicine

## 2011-04-20 ENCOUNTER — Ambulatory Visit (INDEPENDENT_AMBULATORY_CARE_PROVIDER_SITE_OTHER): Payer: Medicare Other | Admitting: Family Medicine

## 2011-04-20 ENCOUNTER — Encounter: Payer: Self-pay | Admitting: Family Medicine

## 2011-04-20 DIAGNOSIS — F329 Major depressive disorder, single episode, unspecified: Secondary | ICD-10-CM

## 2011-04-20 DIAGNOSIS — R5382 Chronic fatigue, unspecified: Secondary | ICD-10-CM

## 2011-04-20 DIAGNOSIS — F938 Other childhood emotional disorders: Secondary | ICD-10-CM

## 2011-04-20 DIAGNOSIS — F32A Depression, unspecified: Secondary | ICD-10-CM

## 2011-04-20 DIAGNOSIS — M62830 Muscle spasm of back: Secondary | ICD-10-CM

## 2011-04-20 DIAGNOSIS — G8929 Other chronic pain: Secondary | ICD-10-CM

## 2011-04-20 DIAGNOSIS — M5380 Other specified dorsopathies, site unspecified: Secondary | ICD-10-CM

## 2011-04-20 DIAGNOSIS — M797 Fibromyalgia: Secondary | ICD-10-CM

## 2011-04-20 DIAGNOSIS — M549 Dorsalgia, unspecified: Secondary | ICD-10-CM

## 2011-04-20 DIAGNOSIS — F4320 Adjustment disorder, unspecified: Secondary | ICD-10-CM

## 2011-04-20 MED ORDER — TRAZODONE HCL 150 MG PO TABS: 300.0000 mg | ORAL_TABLET | Freq: Every day | ORAL | Status: DC

## 2011-04-20 MED ORDER — FENTANYL 25 MCG/HR TD PT72: 1.0000 | MEDICATED_PATCH | TRANSDERMAL | Status: DC

## 2011-04-20 MED ORDER — BUSPIRONE HCL 15 MG PO TABS: 15.0000 mg | ORAL_TABLET | Freq: Two times a day (BID) | ORAL | Status: DC | PRN

## 2011-04-20 MED ORDER — TIZANIDINE HCL 4 MG PO TABS: 4.0000 mg | ORAL_TABLET | Freq: Three times a day (TID) | ORAL | Status: DC

## 2011-04-20 NOTE — Patient Instructions (Signed)

## 2011-04-23 NOTE — Assessment & Plan Note (Signed)
Patient had her Duragesic patches renewed with a second prescription for March as well.  Because of her insurance company rules and formulary changes she was placed on a new muscle relaxer zenaflex and soma was stopped.

## 2011-04-23 NOTE — Assessment & Plan Note (Signed)
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

## 2011-04-23 NOTE — Progress Notes (Signed)
Subjective:     Patient ID: April Price, female   DOB: 09/11/1955, 56 y.o.   MRN: [id number]  HPI Fibromyalgia follow up L thigh bursitis Chronic back pain requiring trigger point injetions.  situalional disturbeb=nces Review of Systems See above BP 139/75  Pulse 95  Temp(Src) 98.2 F (36.8 C) (Oral)  Ht 5\' 4"  (1.626 m)  Wt 175 lb (79.379 kg)  BMI 30.04 kg/m2  SpO2 94%    Objective:   Physical Exam Tender bursa in upper L thigh Multiple trigger points on both sides of the back and neck

## 2011-04-23 NOTE — Assessment & Plan Note (Addendum)
The depression and insomnia which was being treated w/300 mg of trazadone also had to be changed due to insurance formulary to 150 at night.

## 2011-05-02 DIAGNOSIS — F331 Major depressive disorder, recurrent, moderate: Secondary | ICD-10-CM | POA: Diagnosis not present

## 2011-05-02 NOTE — Telephone Encounter (Signed)
No phone call or message noted.

## 2011-05-23 DIAGNOSIS — F331 Major depressive disorder, recurrent, moderate: Secondary | ICD-10-CM | POA: Diagnosis not present

## 2011-06-13 DIAGNOSIS — F331 Major depressive disorder, recurrent, moderate: Secondary | ICD-10-CM | POA: Diagnosis not present

## 2011-06-20 ENCOUNTER — Ambulatory Visit (INDEPENDENT_AMBULATORY_CARE_PROVIDER_SITE_OTHER): Payer: Medicare Other | Admitting: Family Medicine

## 2011-06-20 ENCOUNTER — Encounter: Payer: Self-pay | Admitting: Family Medicine

## 2011-06-20 VITALS — BP 153/89 | HR 99 | Ht 64.0 in | Wt 172.0 lb

## 2011-06-20 DIAGNOSIS — M62830 Muscle spasm of back: Secondary | ICD-10-CM

## 2011-06-20 DIAGNOSIS — J3089 Other allergic rhinitis: Secondary | ICD-10-CM

## 2011-06-20 DIAGNOSIS — M549 Dorsalgia, unspecified: Secondary | ICD-10-CM

## 2011-06-20 DIAGNOSIS — J302 Other seasonal allergic rhinitis: Secondary | ICD-10-CM

## 2011-06-20 DIAGNOSIS — IMO0001 Reserved for inherently not codable concepts without codable children: Secondary | ICD-10-CM

## 2011-06-20 DIAGNOSIS — M797 Fibromyalgia: Secondary | ICD-10-CM

## 2011-06-20 DIAGNOSIS — F17200 Nicotine dependence, unspecified, uncomplicated: Secondary | ICD-10-CM

## 2011-06-20 DIAGNOSIS — G8929 Other chronic pain: Secondary | ICD-10-CM

## 2011-06-20 DIAGNOSIS — Z720 Tobacco use: Secondary | ICD-10-CM

## 2011-06-20 DIAGNOSIS — J309 Allergic rhinitis, unspecified: Secondary | ICD-10-CM

## 2011-06-20 MED ORDER — FENTANYL 25 MCG/HR TD PT72: 1.0000 | MEDICATED_PATCH | TRANSDERMAL | Status: DC

## 2011-06-20 MED ORDER — NICOTINE 10 MG IN INHA: 1.0000 | RESPIRATORY_TRACT | Status: DC | PRN

## 2011-06-20 MED ORDER — VARENICLINE TARTRATE 0.5 MG PO TABS: 0.5000 mg | ORAL_TABLET | Freq: Two times a day (BID) | ORAL | Status: DC

## 2011-06-20 MED ORDER — MONTELUKAST SODIUM 10 MG PO TABS: 10.0000 mg | ORAL_TABLET | Freq: Every day | ORAL | Status: DC

## 2011-06-20 MED ORDER — VARENICLINE TARTRATE 1 MG PO TABS: 1.0000 mg | ORAL_TABLET | Freq: Two times a day (BID) | ORAL | Status: DC

## 2011-06-20 NOTE — Patient Instructions (Signed)
Smoking Cessation, Tips for Success     YOU CAN QUIT SMOKING   If you are ready to quit smoking, congratulations! You have chosen to help yourself be healthier. Cigarettes bring nicotine, tar, carbon monoxide, and other irritants into your body. Your lungs, heart, and blood vessels will be able to work better without these poisons. There are many different ways to quit smoking. Nicotine gum, nicotine patches, a nicotine inhaler, or nicotine nasal spray can help with physical craving. Hypnosis, support groups, and medicines help break the habit of smoking. Here are some tips to help you quit for good.     . Throw away all cigarettes.   . Clean and remove all ashtrays from your home, work, and car.   . On a card, write down your reasons for quitting. Carry the card with you and read it when you get the urge to smoke.   . Cleanse your body of nicotine. Drink enough water and fluids to keep your urine clear or pale yellow. Do this after quitting to flush the nicotine from your body.   . Learn to predict your moods. Do not let a bad situation be your excuse to have a cigarette. Some situations in your life might tempt you into wanting a cigarette.   . Never have "just one" cigarette. It leads to wanting another and another. Remind yourself of your decision to quit.   . Change habits associated with smoking. If you smoked while driving or when feeling stressed, try other activities to replace smoking. Stand up when drinking your coffee. Brush your teeth after eating. Sit in a different chair when you read the paper. Avoid alcohol while trying to quit, and try to drink fewer caffeinated beverages. Alcohol and caffeine may urge you to smoke.   . Avoid foods and drinks that can trigger a desire to smoke, such as sugary or spicy foods and alcohol.   . Ask people who smoke not to smoke around you.   . Have something planned to do right after eating or having a cup of coffee. Take a walk or exercise to perk you up. This will  help to keep you from overeating.   . Try a relaxation exercise to calm you down and decrease your stress. Remember, you may be tense and nervous for the first 2 weeks after you quit, but this will pass.   . Find new activities to keep your hands busy. Play with a pen, coin, or rubber band. Doodle or draw things on paper.   . Brush your teeth right after eating. This will help cut down on the craving for the taste of tobacco after meals. You can try mouthwash, too.   . Use oral substitutes, such as lemon drops, carrots, a cinnamon stick, or chewing gum, in place of cigarettes. Keep them handy so they are available when you have the urge to smoke.   . When you have the urge to smoke, try deep breathing.   . Designate your home as a nonsmoking area.   . If you are a heavy smoker, ask your caregiver about a prescription for nicotine chewing gum. It can ease your withdrawal from nicotine.   . Reward yourself. Set aside the cigarette money you save and buy yourself something nice.   . Look for support from others. Join a support group or smoking cessation program. Ask someone at home or at work to help you with your plan to quit smoking.   . Always ask   yourself, "Do I need this cigarette or is this just a reflex?" Tell yourself, "Today, I choose not to smoke," or "I do not want to smoke." You are reminding yourself of your decision to quit, even if you do smoke a cigarette.    HOW WILL I FEEL WHEN I QUIT SMOKING?     . The benefits of not smoking start within days of quitting.   . You may have symptoms of withdrawal because your body is used to nicotine (the addictive substance in cigarettes). You may crave cigarettes, be irritable, feel very hungry, cough often, get headaches, or have difficulty concentrating.   . The withdrawal symptoms are only temporary. They are strongest when you first quit but will go away within 10 to 14 days.   . When withdrawal symptoms occur, stay in control. Think about your reasons for  quitting. Remind yourself that these are signs that your body is healing and getting used to being without cigarettes.   . Remember that withdrawal symptoms are easier to treat than the major diseases that smoking can cause.   . Even after the withdrawal is over, expect periodic urges to smoke. However, these cravings are generally short-lived and will go away whether you smoke or not. Do not smoke!   . If you relapse and smoke again, do not lose hope. Most smokers quit 3 times before they are successful.   . If you relapse, do not give up! Plan ahead and think about what you will do the next time you get the urge to smoke.    LIFE AS A NONSMOKER: MAKE IT FOR A MONTH, MAKE IT FOR LIFE     Day 1: Hang this page where you will see it every day.   Day 2: Get rid of all ashtrays, matches, and lighters.   Day 3: Drink water. Breathe deeply between sips.   Day 4: Avoid places with smoke-filled air, such as bars, clubs, or the smoking section of restaurants.   Day 5: Keep track of how much money you save by not smoking.   Day 6: Avoid boredom. Keep a good book with you or go to the movies.   Day 7: Reward yourself! One week without smoking!   Day 8: Make a dental appointment to get your teeth cleaned.   Day 9: Decide how you will turn down a cigarette before it is offered to you.   Day 10: Review your reasons for quitting.   Day 11: Distract yourself. Stay active to keep your mind off smoking and to relieve tension. Take a walk, exercise, read a book, do a crossword puzzle, or try a new hobby.   Day 12: Exercise. Get off the bus before your stop or use stairs instead of escalators.   Day 13: Call on friends for support and encouragement.   Day 14: Reward yourself! Two weeks without smoking!   Day 15: Practice deep breathing exercises.   Day 16: Bet a friend that you can stay a nonsmoker.   Day 17: Ask to sit in nonsmoking sections of restaurants.   Day 18: Hang up "No Smoking" signs.   Day 19: Think of yourself as a  nonsmoker.   Day 20: Each morning, tell yourself you will not smoke.   Day 21: Reward yourself! Three weeks without smoking!   Day 22: Think of smoking in negative ways. Remember how it stains your teeth, gives you bad breath, and leaves you short of breath.   Day   23: Eat a nutritious breakfast.   Day 24:Do not relive your days as a smoker.   Day 25: Hold a pencil in your hand when talking on the telephone.   Day 26: Tell all your friends you do not smoke.   Day 27: Think about how much better food tastes.   Day 28: Remember, one cigarette is one too many.   Day 29: Take up a hobby that will keep your hands busy.   Day 30: Congratulations! One month without smoking! Give yourself a big reward.     Your caregiver can direct you to community resources or hospitals for support, which may include:   . Group support.   . Education.   . Hypnosis.   . Subliminal therapy.      Document Released: 12/24/2003 Document Revised: 03/16/2011 Document Reviewed: 01/11/2009   [employer] Patient Information 2012 [employer].

## 2011-06-20 NOTE — Assessment & Plan Note (Signed)
Trigger Point Injection   Pre-operative diagnosis: fibromyalgia  Post-operative diagnosis: fibromyalgia  After risks and benefits were explained including bleeding, infection, worsening of the pain, damage to the area being injected, weakness, allergic reaction to medications, vascular injection, and nerve damage, signed consent was obtained.  All questions were answered.    The area of the trigger point was identified and the skin prepped three times with betadine and the betadine allowed to dry.  Next, a 25 gauge 1 inch needle was placed in the area of the trigger point.  Once reproduction of the pain was elicited and negative aspiration confirmed, the trigger point was injected and the needle removed.  Over 25 injections were given overer more than 5 muscle groups in the neck ,upper back and L shoulder.  The patient did tolerate the procedure well and there were not complications.    Medication used: total of 25ml of Marcaine,  25 ml of 2% lidocaine w/epinepherine,  and 2 ml of Toradol were mixed and injected. Trigger points injected: greater than 20    Trigger point(s) location(s):  bilateral upper back and neck and R shoulder.

## 2011-06-20 NOTE — Progress Notes (Signed)
  Subjective:    Patient ID: April Price, female    DOB: [birth year], 56 y.o.   MRN: [id number]  Back Pain   Allergies. Patient is taking an over-the-counter allergy medication that is not helping much. She has taken Singulair in the past and now since it is generic she may be able to get coverage for it. #2 fibromyalgia/chronic fatigue. She is here for trigger point injection and she also needs refill of her Duragesic patch. #3 tobacco cessation. She is willing to try Chantix to try to stop smoking. She was backup with Nicotrol inhaler to use as backup. #4 stress disturbance. She has finally been successful in court disassociating from guardianship of her adopted daughter. She's had a great deal of relief by doing that. Review of Systems  Musculoskeletal: Positive for back pain.       She reports the hip that we injected/trigger point has improved great improving her gait and ambulation.  All other systems reviewed and are negative.      BP 153/89  Pulse 99  Ht 5\' 4"  (1.626 m)  Wt 172 lb (78.019 kg)  BMI 29.52 kg/m2  SpO2 94% Objective:   Physical Exam  Constitutional: She is oriented to person, place, and time. She appears well-developed and well-nourished.  HENT:  Head: Normocephalic.  Neurological: She is alert and oriented to person, place, and time.  Skin: Skin is warm and dry.  Psychiatric: She has a normal mood and affect. Her behavior is normal.      Assessment & Plan:  #1 allergies. Singulair 10 mg one by mouth daily. #2 two  prescriptions of Duragesic was written one for April and one from May. Followup in 2 months repeat injections. #3 tobacco counseling. Using Chantix may be a good idea she is ready on an antidepressant Wellbutrin. Discussed side effects and prescriptions were written. As well as a prescription for Nicotrol inhaler. #4 stress disturbance improved.

## 2011-06-27 DIAGNOSIS — F331 Major depressive disorder, recurrent, moderate: Secondary | ICD-10-CM | POA: Diagnosis not present

## 2011-07-11 DIAGNOSIS — F331 Major depressive disorder, recurrent, moderate: Secondary | ICD-10-CM | POA: Diagnosis not present

## 2011-08-09 ENCOUNTER — Other Ambulatory Visit: Payer: Self-pay | Admitting: *Deleted

## 2011-08-09 MED ORDER — TIZANIDINE HCL 4 MG PO TABS: 4.0000 mg | ORAL_TABLET | Freq: Three times a day (TID) | ORAL | Status: DC

## 2011-08-22 DIAGNOSIS — F331 Major depressive disorder, recurrent, moderate: Secondary | ICD-10-CM | POA: Diagnosis not present

## 2011-08-29 ENCOUNTER — Ambulatory Visit (INDEPENDENT_AMBULATORY_CARE_PROVIDER_SITE_OTHER): Payer: Medicare Other | Admitting: Family Medicine

## 2011-08-29 ENCOUNTER — Encounter: Payer: Self-pay | Admitting: Family Medicine

## 2011-08-29 VITALS — BP 156/93 | HR 104 | Ht 64.0 in | Wt 179.0 lb

## 2011-08-29 DIAGNOSIS — Z7189 Other specified counseling: Secondary | ICD-10-CM

## 2011-08-29 DIAGNOSIS — G8929 Other chronic pain: Secondary | ICD-10-CM

## 2011-08-29 DIAGNOSIS — M62830 Muscle spasm of back: Secondary | ICD-10-CM

## 2011-08-29 DIAGNOSIS — R5382 Chronic fatigue, unspecified: Secondary | ICD-10-CM

## 2011-08-29 DIAGNOSIS — M797 Fibromyalgia: Secondary | ICD-10-CM

## 2011-08-29 DIAGNOSIS — F4320 Adjustment disorder, unspecified: Secondary | ICD-10-CM

## 2011-08-29 DIAGNOSIS — M549 Dorsalgia, unspecified: Secondary | ICD-10-CM

## 2011-08-29 DIAGNOSIS — I10 Essential (primary) hypertension: Secondary | ICD-10-CM

## 2011-08-29 DIAGNOSIS — Z716 Tobacco abuse counseling: Secondary | ICD-10-CM

## 2011-08-29 DIAGNOSIS — M539 Dorsopathy, unspecified: Secondary | ICD-10-CM

## 2011-08-29 MED ORDER — FENTANYL 25 MCG/HR TD PT72: 1.0000 | MEDICATED_PATCH | TRANSDERMAL | Status: DC

## 2011-08-29 MED ORDER — TIZANIDINE HCL 4 MG PO TABS: 4.0000 mg | ORAL_TABLET | Freq: Three times a day (TID) | ORAL | Status: DC

## 2011-08-29 MED ORDER — TRAMADOL HCL 50 MG PO TABS: ORAL_TABLET | ORAL | Status: DC

## 2011-08-29 MED ORDER — HYDROCHLOROTHIAZIDE 25 MG PO TABS: 25.0000 mg | ORAL_TABLET | Freq: Every day | ORAL | Status: DC

## 2011-08-29 NOTE — Progress Notes (Signed)
Subjective:    Patient ID: April Price, female    DOB: 1956/01/14, 56 y.o.   MRN: [id number]  HPI  #1Depresion / situation disturbance. the patient is coping separation of her and her former adoptive daughter. She is having less contact with her and she is being shuffled between group home and central psychiatric [facility]. She is still seeing her psychologist. #2 fibromyalgia.joint pain has been a challenge. She has also having some pain in her left thigh as well as the pain in the neck and shoulder with C-spine and lumbar spine.  #3 chronic fatigue no new changes.  #4  sleep disturbance. She reports the tramadol did not seem to help much for the sleep disturbance such as a longer taking the Desyrel at this time. She reports unsuccessful improvement with Remeron Lunesta and Ambien in the past.   #5 tobacco abuse. She is using the inhaler to keep from smoking but the Chantix had too much side effects for her to use. #6 elevated blood pressure has remained elevated. We need to consider diagnosis of hypertension now.  #7 health maintenance. Reviewed immunization status and health maintenance issues with patient.   Review of Systems  Constitutional: Positive for activity change, appetite change and fatigue.  HENT: Positive for neck pain and neck stiffness.   Cardiovascular: Negative for chest pain.  Musculoskeletal: Positive for myalgias, back pain, joint swelling, arthralgias and gait problem.  Neurological: Positive for weakness.  Psychiatric/Behavioral: Positive for sleep disturbance.      BP 156/93  Pulse 104  Ht 5\' 4"  (1.626 m)  Wt 179 lb (81.194 kg)  BMI 30.73 kg/m2  SpO2 95% Objective:   Physical Exam  Constitutional: She is oriented to person, place, and time. She appears well-developed and well-nourished.  HENT:  Head: Normocephalic.  Neck: Normal range of motion.  Cardiovascular: Normal rate and regular rhythm.  Exam reveals gallop. Exam reveals no friction  rub.   No murmur heard. Pulmonary/Chest: Effort normal and breath sounds normal.  Musculoskeletal: She exhibits tenderness.       Multiple tenderness over the neck and shoulder and both the lumbar and cervical spine.  Neurological: She is alert and oriented to person, place, and time.  Skin: Skin is warm and dry.  Psychiatric: She has a normal mood and affect. Her behavior is normal.      Assessment & Plan:  #1smoking cessation.continue with the Nicotrol inhaler at this time.  #2 fibromyalgia.injections were performed over the neck shoulder lumbar spine both trapezius muscles and one site on the left side patient tolerated procedure well. Renewal of her fentanyl patch for May and June were given as well  #3 chronic fatigue no new changes  #4 depression/situation disturbance we'll continue to follow and try to support  #5 elevated blood pressure /hypertension. Change diagnosis to hypertension and per discussion with patient will try mono therapy. She's going to try to reduce about salt in diet and will try her on HCTZ 25 mg one tablet a day.  #6 sleep disturbance. At this point time no new agents or medications to try. #7 health maintenance  Her last colonoscopy was done the fall of 2006 or 2007. 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  As far as tetanus she is allergic to tetanus so we postponed that indefinitely Pap smear patient has had a hysterectomy no further Pap smears needed Mammogram as been done yearly ordered by her general surgeon she should have another  one sometime this summer we'll bring her back in the fall for a general exam at that time.

## 2011-08-29 NOTE — Assessment & Plan Note (Signed)
Trigger Point Injection   Pre-operative diagnosis: fibromyalgia  Post-operative diagnosis: myofascial pain and fibromyalgia  After risks and benefits were explained including bleeding, infection, worsening of the pain, damage to the area being injected, weakness, allergic reaction to medications, vascular injection, and nerve damage, signed consent was obtained.  All questions were answered.    The area of the trigger point was identified and the skin prepped three times with betadine and the betadine allowed to dry.  Next, a 25 gauge 0.5 inch needle was placed in the area of the trigger point.  Once reproduction of the pain was elicited and negative aspiration confirmed, the trigger point was injected and the needle removed.    The patient did tolerate the procedure well and there were not complications.    Medication used: 60 mg Toradol, 30 mL 1% Mepivicaine and 30 ml of 2% xylocaine w/epinephrine was usedin various combinations  Trigger points injected: 0ver 25    Trigger point(s) location(s):  cervical and lumbar spine, both trapezius muscle, both shoulders and L thigh

## 2011-08-29 NOTE — Patient Instructions (Signed)

## 2011-10-03 DIAGNOSIS — F331 Major depressive disorder, recurrent, moderate: Secondary | ICD-10-CM | POA: Diagnosis not present

## 2011-10-24 DIAGNOSIS — F331 Major depressive disorder, recurrent, moderate: Secondary | ICD-10-CM | POA: Diagnosis not present

## 2011-10-25 ENCOUNTER — Ambulatory Visit: Payer: Self-pay | Admitting: General Surgery

## 2011-10-25 DIAGNOSIS — Z1231 Encounter for screening mammogram for malignant neoplasm of breast: Secondary | ICD-10-CM | POA: Diagnosis not present

## 2011-10-31 ENCOUNTER — Encounter: Payer: Self-pay | Admitting: Family Medicine

## 2011-10-31 ENCOUNTER — Ambulatory Visit (INDEPENDENT_AMBULATORY_CARE_PROVIDER_SITE_OTHER): Payer: Medicare Other | Admitting: Family Medicine

## 2011-10-31 VITALS — BP 161/96 | HR 90 | Ht 64.0 in | Wt 180.0 lb

## 2011-10-31 DIAGNOSIS — R030 Elevated blood-pressure reading, without diagnosis of hypertension: Secondary | ICD-10-CM

## 2011-10-31 DIAGNOSIS — IMO0001 Reserved for inherently not codable concepts without codable children: Secondary | ICD-10-CM

## 2011-10-31 DIAGNOSIS — M25569 Pain in unspecified knee: Secondary | ICD-10-CM

## 2011-10-31 DIAGNOSIS — M797 Fibromyalgia: Secondary | ICD-10-CM

## 2011-10-31 DIAGNOSIS — M25562 Pain in left knee: Secondary | ICD-10-CM

## 2011-10-31 DIAGNOSIS — G8929 Other chronic pain: Secondary | ICD-10-CM

## 2011-10-31 MED ORDER — FENTANYL 25 MCG/HR TD PT72: 1.0000 | MEDICATED_PATCH | TRANSDERMAL | Status: DC

## 2011-10-31 NOTE — Patient Instructions (Signed)

## 2011-11-01 NOTE — Progress Notes (Addendum)
  Subjective:    Patient ID: April Price, female    DOB: April 22, 1955, 56 y.o.   MRN: [id number]  HPI #1Patient is here for follow up of back pain. She is here for back trigger point injections and #2 renewal of her pain medication.  #3 she also reports having L hip pain. She has found that when her L knee hurts her hip and then her back starts hurting. If she pops the knee and if feels like it goes back in she no longer has the hip/thigh pain.  #4 elevated BP Review of Systems  Psychiatric/Behavioral: Negative for behavioral problems and dysphoric mood.  All other systems reviewed and are negative.      BP 161/96  Pulse 90  Ht 5\' 4"  (1.626 m)  Wt 180 lb (81.647 kg)  BMI 30.90 kg/m2  SpO2 94% Objective:   Physical Exam  Constitutional: She is oriented to person, place, and time. She appears well-nourished. No distress.  HENT:  Head: Normocephalic.  Musculoskeletal: She exhibits no edema and no tenderness.       No abnormality of the L knee noted othe r than some mild arthritis  Neurological: She is alert and oriented to person, place, and time. No cranial nerve deficit.  Skin: Skin is warm.      Assessment & Plan:  #1Back was injected see progress/procedure note. #2 Renewal of pain medications done. Will continue on the fentanyl patch and 2 scrips given. #3 recommend a cloth tight knee sleeve for support #4 health maintenance has gotten her mammogram at [facility] and will be seen in October for wellness visit. #5 BP elevated will monitoir  Trigger Point Injection   Pre-operative diagnosis: fibromyalgia  Post-operative diagnosis: fibromyalgia  After risks and benefits were explained including bleeding, infection, worsening of the pain, damage to the area being injected, weakness, allergic reaction to medications, vascular injection, and nerve damage, signed consent was obtained.  All questions were answered.    The area of the trigger point was identified and the skin  prepped three times with alcohol and the alcohol allowed to dry.  Next, a 25 gauge 1 inch needle was placed in the area of the trigger point.  Once reproduction of the pain was elicited and negative aspiration confirmed, the trigger point was injected and the needle removed.    The patient did tolerate the procedure well and there were not complications.    Medication used: 60 mg Toradol, 15 mL 2% lidocaine w/epinephrine, and 15 ml of marcaine was mixed and injected in over 20 sites. A second 30 ml w/o Toradol was redrawn and injected in over another 20 sites  Trigger points injected: 2 different solutions in at least 40 sites  Trigger point(s) location(s):  bilateral along the neck lumbar spine , left scapula and both shoulders Patient had marked improvement

## 2011-11-06 DIAGNOSIS — N6019 Diffuse cystic mastopathy of unspecified breast: Secondary | ICD-10-CM | POA: Diagnosis not present

## 2011-11-14 DIAGNOSIS — F331 Major depressive disorder, recurrent, moderate: Secondary | ICD-10-CM | POA: Diagnosis not present

## 2011-12-05 DIAGNOSIS — F331 Major depressive disorder, recurrent, moderate: Secondary | ICD-10-CM | POA: Diagnosis not present

## 2011-12-26 DIAGNOSIS — F331 Major depressive disorder, recurrent, moderate: Secondary | ICD-10-CM | POA: Diagnosis not present

## 2012-01-02 ENCOUNTER — Ambulatory Visit (INDEPENDENT_AMBULATORY_CARE_PROVIDER_SITE_OTHER): Payer: Medicare Other | Admitting: Family Medicine

## 2012-01-02 ENCOUNTER — Encounter: Payer: Self-pay | Admitting: Family Medicine

## 2012-01-02 VITALS — BP 137/84 | HR 105 | Ht 64.0 in | Wt 179.0 lb

## 2012-01-02 DIAGNOSIS — M549 Dorsalgia, unspecified: Secondary | ICD-10-CM

## 2012-01-02 DIAGNOSIS — M542 Cervicalgia: Secondary | ICD-10-CM

## 2012-01-02 DIAGNOSIS — R5382 Chronic fatigue, unspecified: Secondary | ICD-10-CM

## 2012-01-02 DIAGNOSIS — M62830 Muscle spasm of back: Secondary | ICD-10-CM

## 2012-01-02 DIAGNOSIS — G8929 Other chronic pain: Secondary | ICD-10-CM

## 2012-01-02 DIAGNOSIS — M797 Fibromyalgia: Secondary | ICD-10-CM

## 2012-01-02 MED ORDER — TRAMADOL HCL 50 MG PO TABS: ORAL_TABLET | ORAL | Status: DC

## 2012-01-02 MED ORDER — FENTANYL 25 MCG/HR TD PT72: 1.0000 | MEDICATED_PATCH | TRANSDERMAL | Status: DC

## 2012-01-02 NOTE — Assessment & Plan Note (Signed)
Trigger Point Injection   Pre-operative diagnosis: fibromyalgia  Post-operative diagnosis: fibromyalgia  After risks and benefits were explained including bleeding, infection, worsening of the pain, damage to the area being injected, weakness, allergic reaction to medications, vascular injection, and nerve damage, signed consent was obtained.  All questions were answered.    The area of the trigger point was identified and the skin prepped three times with alcohol and the alcohol allowed to dry.  Next, a 22 gauge 1 inch needle was placed in the area of the trigger point.  Once reproduction of the pain was elicited and negative aspiration confirmed, the trigger point was injected and the needle removed.    The patient did tolerate the procedure well and there were not complications.    Medication used: 60 mg Toradol; 17 mL 1% Mepivicaine, 17  Ml of % lidocaine with epinephrine.  Trigger points injected: Greater than 20    Trigger point(s) location(s):  bilateral

## 2012-01-02 NOTE — Progress Notes (Signed)
  Subjective:    Patient ID: April Price, female    DOB: [birth year], 56 y.o.   MRN: [id number]  HPI #1 fibromyalgia/multiple triggerpoints/muscle spasm of the neck and shoulder. #2 immunization deficiency #3 chronic pain hip back/fibromyalgia   Review of Systems  Psychiatric/Behavioral: Positive for disturbed wake/sleep cycle and dysphoric mood. The patient is nervous/anxious.        Her dog over 10 years recently died.   All other systems reviewed and are negative.     BP 137/84  Pulse 105  Ht 5\' 4"  (1.626 m)  Wt 179 lb (81.194 kg)  BMI 30.73 kg/m2  SpO2 95% Objective:   Physical Exam  Constitutional: She is oriented to person, place, and time. She appears well-developed and well-nourished.  Musculoskeletal: Normal range of motion.  Neurological: She is alert and oriented to person, place, and time.  Skin: Skin is warm and dry.  Psychiatric: Her mood appears anxious. She exhibits a depressed mood.      Assessment & Plan:  #1 multiple trigger points were injected please see procedure note. Greater than 5 muscles were injected with greater than 15 sites injected #2 off for flu vaccination, and recommend returns for her yearly examination. Was have her flu shot during a yearly examination. #3 history of back pain hip pain and joint pain. She wants renewal of her Duragesic patch which I gave her September and October She reports that her Zanaflex patches do not work as well as a soma. Renewed her Ultram as well.

## 2012-01-02 NOTE — Patient Instructions (Signed)

## 2012-01-16 DIAGNOSIS — F331 Major depressive disorder, recurrent, moderate: Secondary | ICD-10-CM | POA: Diagnosis not present

## 2012-02-01 ENCOUNTER — Encounter: Payer: Medicare Other | Admitting: Family Medicine

## 2012-02-06 ENCOUNTER — Ambulatory Visit (INDEPENDENT_AMBULATORY_CARE_PROVIDER_SITE_OTHER): Payer: Medicare Other | Admitting: Family Medicine

## 2012-02-06 ENCOUNTER — Encounter: Payer: Self-pay | Admitting: Family Medicine

## 2012-02-06 VITALS — BP 131/78 | HR 80 | Ht 64.0 in | Wt 183.0 lb

## 2012-02-06 DIAGNOSIS — E162 Hypoglycemia, unspecified: Secondary | ICD-10-CM | POA: Insufficient documentation

## 2012-02-06 DIAGNOSIS — R5382 Chronic fatigue, unspecified: Secondary | ICD-10-CM | POA: Insufficient documentation

## 2012-02-06 DIAGNOSIS — I10 Essential (primary) hypertension: Secondary | ICD-10-CM | POA: Diagnosis not present

## 2012-02-06 DIAGNOSIS — M810 Age-related osteoporosis without current pathological fracture: Secondary | ICD-10-CM | POA: Insufficient documentation

## 2012-02-06 DIAGNOSIS — M797 Fibromyalgia: Secondary | ICD-10-CM

## 2012-02-06 DIAGNOSIS — H6592 Unspecified nonsuppurative otitis media, left ear: Secondary | ICD-10-CM

## 2012-02-06 DIAGNOSIS — M8580 Other specified disorders of bone density and structure, unspecified site: Secondary | ICD-10-CM

## 2012-02-06 DIAGNOSIS — Z23 Encounter for immunization: Secondary | ICD-10-CM

## 2012-02-06 DIAGNOSIS — H04123 Dry eye syndrome of bilateral lacrimal glands: Secondary | ICD-10-CM | POA: Insufficient documentation

## 2012-02-06 DIAGNOSIS — Z0000 Encounter for general adult medical examination without abnormal findings: Secondary | ICD-10-CM | POA: Diagnosis not present

## 2012-02-06 DIAGNOSIS — N952 Postmenopausal atrophic vaginitis: Secondary | ICD-10-CM | POA: Insufficient documentation

## 2012-02-06 DIAGNOSIS — R319 Hematuria, unspecified: Secondary | ICD-10-CM

## 2012-02-06 DIAGNOSIS — Z716 Tobacco abuse counseling: Secondary | ICD-10-CM

## 2012-02-06 LAB — POCT URINALYSIS DIPSTICK
Bilirubin, UA: NEGATIVE
Glucose, UA: NEGATIVE
Ketones, UA: NEGATIVE
Nitrite, UA: NEGATIVE
Protein, UA: NEGATIVE
Spec Grav, UA: <=1.005
Urobilinogen, UA: 0.2
pH, UA: 6

## 2012-02-06 MED ORDER — CARBOXYMETHYLCELLUL-GLYCERIN 0.5-0.9 % OP SOLN: 1.0000 [drp] | Freq: Two times a day (BID) | OPHTHALMIC | Status: DC | PRN

## 2012-02-06 MED ORDER — ESTROGENS, CONJUGATED 0.625 MG/GM VA CREA: TOPICAL_CREAM | Freq: Every day | VAGINAL | Status: DC

## 2012-02-06 MED ORDER — CEFUROXIME AXETIL 500 MG PO TABS: 500.0000 mg | ORAL_TABLET | Freq: Two times a day (BID) | ORAL | Status: DC

## 2012-02-06 NOTE — Addendum Note (Signed)
Addended by: Hassan Rowan on: 02/06/2012 03:09 PM   Modules accepted: Kipp Brood

## 2012-02-06 NOTE — Progress Notes (Addendum)
Subjective:    Patient ID: April Price, female    DOB: [birth year], 56 y.o.   MRN: [id number]  HPI  Patient's here for yearly examination.  Review of Systems  Constitutional: Positive for activity change and fatigue. Negative for fever.  HENT:       She reports having left ear and  Eyes: Positive for redness.       She reports dryness and has used either case before in the past which is helped.  Genitourinary: Positive for flank pain and vaginal pain.       She think she is passing a kidney stone. She feels that it is in her bladder she had a number of these and declined at this time a CT scan.  She also reports having vaginal dryness and has had vaginal cream used before that made a world of difference.  Musculoskeletal: Positive for myalgias and joint swelling.       Reports using the left knee brace helps with her back and pain in the knee.  All other systems reviewed and are negative.      BP 131/78  Pulse 80  Ht 5\' 4"  (1.626 m)  Wt 183 lb (83.008 kg)  BMI 31.41 kg/m2  SpO2 93% Objective:   Physical Exam  Vitals reviewed. Constitutional: She is oriented to person, place, and time. She appears well-developed and well-nourished. No distress.  HENT:  Head: Normocephalic and atraumatic.  Right Ear: External ear normal.  Left Ear: Tympanic membrane is erythematous. A middle ear effusion is present.  Eyes: Pupils are equal, round, and reactive to light. Right eye exhibits no exudate. No foreign body present in the right eye. Left eye exhibits no hordeolum.    Neck: Normal range of motion. Neck supple. No JVD present. No tracheal deviation present. No thyromegaly present.  Cardiovascular: Normal rate, regular rhythm and normal heart sounds.   Pulmonary/Chest: Effort normal and breath sounds normal. No respiratory distress. She has no wheezes.  Abdominal: Soft. Bowel sounds are normal. She exhibits no distension. There is no tenderness. There is no rebound.    Genitourinary:       Patient declines vaginal or rectal exam at this time  Musculoskeletal: Normal range of motion.  Lymphadenopathy:    She has no cervical adenopathy.  Neurological: She is alert and oriented to person, place, and time. No cranial nerve deficit.  Skin: Skin is warm and dry. No rash noted. No erythema.  Psychiatric: She has a normal mood and affect. Her behavior is normal.    Results for orders placed in visit on 02/06/12  POCT URINALYSIS DIPSTICK      Component Value Range   Color, UA yellow     Clarity, UA clear     Glucose, UA neg     Bilirubin, UA neg     Ketones, UA neg     Spec Grav, UA <=1.005     Blood, UA mod     pH, UA 6.0     Protein, UA neg     Urobilinogen, UA 0.2     Nitrite, UA neg     Leukocytes, UA Trace         EKG shows sinus rhythm Assessment & Plan:  #1 health maintenance. Routine lab work.   #2 immunization status. She needs a flu shot . #3 vaginal dryness. Question vaginal cream ordered  #4 otitis media as and Ceftin 500 mg one tablet twice a day  #5 immunization update flu shot  given  #6 history hypoglycemia A1c will be ordered  #7 fibromyalgia/chronic fatigue recheck EBV  #8 hypertension. Check lipid and CMP  #9 tobacco abuse//counseling. Stressed importance of not smoking  #10 osteopenia. Schedule for bone density.  Return in about a month for trigger point injection. Patient informed that because there is blood in her urine repeat urine analysis needs to be done when she returns in a month.     Subjective:     April Price is a 56 y.o. female and is here for a comprehensive physical exam. The patient reports problems - Ear pain, and bladder and pelvic pain from presumed kidney stone.  History   Social History  . Marital Status: Single    Spouse Name: N/A    Number of Children: N/A  . Years of Education: N/A   Occupational History  . Not on file.   Social History Main Topics  . Smoking status:  Current Some Day Smoker -- 30 years    Types: Cigarettes  . Smokeless tobacco: Never Used  . Alcohol Use: No  . Drug Use: No  . Sexually Active: Not Currently   Other Topics Concern  . Not on file   Social History Narrative  . No narrative on file   Health Maintenance  Topic Date Due  . Influenza Vaccine  12/10/2011  . Mammogram  09/08/2013  . Colonoscopy  11/29/2015  . Tetanus/tdap  01/22/1975  . Pap Smear  09/09/2007    The following portions of the patient's history were reviewed and updated as appropriate:  She  has a past medical history of Chronic fatigue; Depression; GERD (gastroesophageal reflux disease); Asthma; Allergy; Neuromuscular disorder; and Fibromyalgia muscle pain. She has Fibromyalgia; Chronic back pain; and Spasm of back muscles on her pertinent problem list. She  has past surgical history that includes Back surgery; Breast surgery; Abdominal hysterectomy; Cholecystectomy; Nasal sinus surgery; and cataract. Her family history includes Heart disease in her brother, father, and mother and Vascular Disease in her sister. She  reports that she has been smoking Cigarettes.  She has smoked for the past 30 years. She has never used smokeless tobacco. She reports that she does not drink alcohol or use illicit drugs. She has a current medication list which includes the following prescription(s): buspirone, epinephrine, fentanyl, hydrochlorothiazide, montelukast, prednisolone acetate, ranitidine, tizanidine, tramadol, carboxymethylcellul-glycerin, cefuroxime, conjugated estrogens, nicotine, and trazodone. Current Outpatient Prescriptions on File Prior to Visit  Medication Sig Dispense Refill  . busPIRone (BUSPAR) 15 MG tablet Take 1 tablet (15 mg total) by mouth 2 (two) times daily as needed.  60 tablet  4  . EPINEPHrine (EPIPEN JR) 0.15 MG/0.3ML injection Inject 0.3 mLs (0.15 mg total) into the muscle as needed.  1 each  6  . fentaNYL (DURAGESIC) 25 MCG/HR Place 1 patch (25 mcg  total) onto the skin every 3 (three) days.  10 patch  0  . hydrochlorothiazide (HYDRODIURIL) 25 MG tablet Take 1 tablet (25 mg total) by mouth daily.  30 tablet  11  . montelukast (SINGULAIR) 10 MG tablet Take 1 tablet (10 mg total) by mouth at bedtime.  30 tablet  11  . prednisoLONE acetate (PRED FORTE) 1 % ophthalmic suspension 1 drop 4 (four) times daily.        . ranitidine (ZANTAC) 150 MG tablet Take 150 mg by mouth 2 (two) times daily.        Marland Kitchen tiZANidine (ZANAFLEX) 4 MG tablet Take 1 tablet (4 mg total) by mouth  3 (three) times daily.  90 tablet  3  . traMADol (ULTRAM) 50 MG tablet 1-2 tablets po q 8hrs prn  120 tablet  3  . Carboxymethylcellul-Glycerin (EQ LUBRICATING EYE DROPS) 0.5-0.9 % SOLN Apply 1 drop to eye 2 (two) times daily as needed.  15 mL  11  . nicotine (NICOTROL) 10 MG inhaler Inhale 1 puff into the lungs as needed for smoking cessation.  42 each  11  . trazodone (DESYREL) 150 MG tablet Take 2 tablets (300 mg total) by mouth at bedtime. Start off w/ a third of a tablet and increase by 1/3 every 2 weeks as needed  60 tablet  6   She is allergic to penicillins; tetanus toxoids; and tylenol..  Review of Systems Pertinent items are noted in HPI.   Objective:    General appearance: alert, cooperative and appears older than stated age    Assessment:    Healthy female declined by patient as far as rectal and vaginal examination.      Plan:     See After Visit Summary for Counseling Recommendations  Depression screen positive for feeling down in the last 2 weeks losing interest in daily life, feeling hopeless times, and crying over several problems  Activities daily living the problem and going from bed to chair climbing a flight of stairs milligrams will place a place in the past year she has even fallen or had a near fall.  She does feel safe at home walking his current exercise and dietary concerns.,

## 2012-02-06 NOTE — Patient Instructions (Signed)
Atrophic Vaginitis Atrophic vaginitis is a problem of low levels of estrogen in women. This problem can happen at any age. It is most common in women who have gone through menopause ("the change").  HOW WILL I KNOW IF I HAVE THIS PROBLEM? You may have:  Trouble with peeing (urinating), such as:  Going to the bathroom often.  A hard time holding your pee until you reach a bathroom.  Leaking pee.  Having pain when you pee.  Itching or a burning feeling.  Vaginal bleeding and spotting.  Pain during sex.  Dryness of the vagina.  A yellow, bad-smelling fluid (discharge) coming from the vagina. HOW WILL MY DOCTOR CHECK FOR THIS PROBLEM?  During your exam, your doctor will likely find the problem.  If there is a vaginal fluid, it may be checked for infection. HOW WILL THIS PROBLEM BE TREATED? Keep the vulvar skin as clean as possible. Moisturizers and lubricants can help with some of the symptoms. Estrogen replacement can help. There are 2 ways to take estrogen:  Systemic estrogen gets estrogen to your whole body. It takes many weeks or months before the symptoms get better.  You take an estrogen pill.  You use a skin patch. This is a patch that you put on your skin.  If you still have your uterus, your doctor may ask you to take a hormone. Talk to your doctor about the right medicine for you.  Estrogen cream.  This puts estrogen only at the part of your body where you apply it. The cream is put into the vagina or put on the vulvar skin. For some women, estrogen cream works faster than pills or the patch. CAN ALL WOMEN WITH THIS PROBLEM USE ESTROGEN? No. Women with certain types of cancer, liver problems, or problems with blood clots should not take estrogen. Your doctor can help you decide the best treatment for your symptoms. Document Released: 09/13/2007 Document Revised: 06/19/2011 Document Reviewed: 09/13/2007 [employer] Patient Information 2013 [employer].  

## 2012-02-07 ENCOUNTER — Encounter: Payer: Self-pay | Admitting: *Deleted

## 2012-02-20 DIAGNOSIS — F331 Major depressive disorder, recurrent, moderate: Secondary | ICD-10-CM | POA: Diagnosis not present

## 2012-03-05 ENCOUNTER — Other Ambulatory Visit (INDEPENDENT_AMBULATORY_CARE_PROVIDER_SITE_OTHER): Payer: Medicare Other

## 2012-03-05 ENCOUNTER — Ambulatory Visit: Payer: Medicare Other | Admitting: Family Medicine

## 2012-03-05 DIAGNOSIS — M899 Disorder of bone, unspecified: Secondary | ICD-10-CM

## 2012-03-05 DIAGNOSIS — M949 Disorder of cartilage, unspecified: Secondary | ICD-10-CM

## 2012-03-12 ENCOUNTER — Encounter: Payer: Self-pay | Admitting: Family Medicine

## 2012-03-12 ENCOUNTER — Ambulatory Visit (INDEPENDENT_AMBULATORY_CARE_PROVIDER_SITE_OTHER): Payer: Medicare Other

## 2012-03-12 ENCOUNTER — Ambulatory Visit (INDEPENDENT_AMBULATORY_CARE_PROVIDER_SITE_OTHER): Payer: Medicare Other | Admitting: Family Medicine

## 2012-03-12 VITALS — BP 130/70 | HR 90 | Ht 64.0 in | Wt 180.0 lb

## 2012-03-12 DIAGNOSIS — G4700 Insomnia, unspecified: Secondary | ICD-10-CM | POA: Diagnosis not present

## 2012-03-12 DIAGNOSIS — G8929 Other chronic pain: Secondary | ICD-10-CM

## 2012-03-12 DIAGNOSIS — M5380 Other specified dorsopathies, site unspecified: Secondary | ICD-10-CM

## 2012-03-12 DIAGNOSIS — M62830 Muscle spasm of back: Secondary | ICD-10-CM

## 2012-03-12 DIAGNOSIS — M549 Dorsalgia, unspecified: Secondary | ICD-10-CM | POA: Diagnosis not present

## 2012-03-12 DIAGNOSIS — F17200 Nicotine dependence, unspecified, uncomplicated: Secondary | ICD-10-CM

## 2012-03-12 DIAGNOSIS — IMO0001 Reserved for inherently not codable concepts without codable children: Secondary | ICD-10-CM | POA: Diagnosis not present

## 2012-03-12 DIAGNOSIS — M899 Disorder of bone, unspecified: Secondary | ICD-10-CM

## 2012-03-12 DIAGNOSIS — Z720 Tobacco use: Secondary | ICD-10-CM

## 2012-03-12 DIAGNOSIS — Z780 Asymptomatic menopausal state: Secondary | ICD-10-CM | POA: Diagnosis not present

## 2012-03-12 DIAGNOSIS — F331 Major depressive disorder, recurrent, moderate: Secondary | ICD-10-CM | POA: Diagnosis not present

## 2012-03-12 DIAGNOSIS — M949 Disorder of cartilage, unspecified: Secondary | ICD-10-CM | POA: Diagnosis not present

## 2012-03-12 DIAGNOSIS — K219 Gastro-esophageal reflux disease without esophagitis: Secondary | ICD-10-CM

## 2012-03-12 DIAGNOSIS — M797 Fibromyalgia: Secondary | ICD-10-CM

## 2012-03-12 MED ORDER — ESOMEPRAZOLE MAGNESIUM 40 MG PO CPDR: 40.0000 mg | DELAYED_RELEASE_CAPSULE | Freq: Every day | ORAL | Status: DC

## 2012-03-12 MED ORDER — RALOXIFENE HCL 60 MG PO TABS: 60.0000 mg | ORAL_TABLET | Freq: Every day | ORAL | Status: AC

## 2012-03-12 MED ORDER — FENTANYL 25 MCG/HR TD PT72: 1.0000 | MEDICATED_PATCH | TRANSDERMAL | Status: DC

## 2012-03-12 MED ORDER — TRAMADOL HCL 50 MG PO TABS: 50.0000 mg | ORAL_TABLET | Freq: Four times a day (QID) | ORAL | Status: DC | PRN

## 2012-03-12 MED ORDER — TIZANIDINE HCL 4 MG PO TABS: 4.0000 mg | ORAL_TABLET | Freq: Four times a day (QID) | ORAL | Status: DC | PRN

## 2012-03-12 MED ORDER — IBUPROFEN 800 MG PO TABS: 800.0000 mg | ORAL_TABLET | Freq: Three times a day (TID) | ORAL | Status: DC | PRN

## 2012-03-12 MED ORDER — TRAZODONE HCL 150 MG PO TABS: 300.0000 mg | ORAL_TABLET | Freq: Every day | ORAL | Status: DC

## 2012-03-12 NOTE — Patient Instructions (Signed)
Smoking Cessation Quitting smoking is important to your health and has many advantages. However, it is not always easy to quit since nicotine is a very addictive drug. Often times, people try 3 times or more before being able to quit. This document explains the best ways for you to prepare to quit smoking. Quitting takes hard work and a lot of effort, but you can do it. ADVANTAGES OF QUITTING SMOKING  You will live longer, feel better, and live better.  Your body will feel the impact of quitting smoking almost immediately.  Within 20 minutes, blood pressure decreases. Your pulse returns to its normal level.  After 8 hours, carbon monoxide levels in the blood return to normal. Your oxygen level increases.  After 24 hours, the chance of having a heart attack starts to decrease. Your breath, hair, and body stop smelling like smoke.  After 48 hours, damaged nerve endings begin to recover. Your sense of taste and smell improve.  After 72 hours, the body is virtually free of nicotine. Your bronchial tubes relax and breathing becomes easier.  After 2 to 12 weeks, lungs can hold more air. Exercise becomes easier and circulation improves.  The risk of having a heart attack, stroke, cancer, or lung disease is greatly reduced.  After 1 year, the risk of coronary heart disease is cut in half.  After 5 years, the risk of stroke falls to the same as a nonsmoker.  After 10 years, the risk of lung cancer is cut in half and the risk of other cancers decreases significantly.  After 15 years, the risk of coronary heart disease drops, usually to the level of a nonsmoker.  If you are pregnant, quitting smoking will improve your chances of having a healthy baby.  The people you live with, especially any children, will be healthier.  You will have extra money to spend on things other than cigarettes. QUESTIONS TO THINK ABOUT BEFORE ATTEMPTING TO QUIT You may want to talk about your answers with your  caregiver.  Why do you want to quit?  If you tried to quit in the past, what helped and what did not?  What will be the most difficult situations for you after you quit? How will you plan to handle them?  Who can help you through the tough times? Your family? Friends? A caregiver?  What pleasures do you get from smoking? What ways can you still get pleasure if you quit? Here are some questions to ask your caregiver:  How can you help me to be successful at quitting?  What medicine do you think would be best for me and how should I take it?  What should I do if I need more help?  What is smoking withdrawal like? How can I get information on withdrawal? GET READY  Set a quit date.  Change your environment by getting rid of all cigarettes, ashtrays, matches, and lighters in your home, car, or work. Do not let people smoke in your home.  Review your past attempts to quit. Think about what worked and what did not. GET SUPPORT AND ENCOURAGEMENT You have a better chance of being successful if you have help. You can get support in many ways.  Tell your family, friends, and co-workers that you are going to quit and need their support. Ask them not to smoke around you.  Get individual, group, or telephone counseling and support. Programs are available at local hospitals and health centers. Call your local health department for   information about programs in your area.  Spiritual beliefs and practices may help some smokers quit.  Download a "quit meter" on your computer to keep track of quit statistics, such as how long you have gone without smoking, cigarettes not smoked, and money saved.  Get a self-help book about quitting smoking and staying off of tobacco. LEARN NEW SKILLS AND BEHAVIORS  Distract yourself from urges to smoke. Talk to someone, go for a walk, or occupy your time with a task.  Change your normal routine. Take a different route to work. Drink tea instead of coffee.  Eat breakfast in a different place.  Reduce your stress. Take a hot bath, exercise, or read a book.  Plan something enjoyable to do every day. Reward yourself for not smoking.  Explore interactive web-based programs that specialize in helping you quit. GET MEDICINE AND USE IT CORRECTLY Medicines can help you stop smoking and decrease the urge to smoke. Combining medicine with the above behavioral methods and support can greatly increase your chances of successfully quitting smoking.  Nicotine replacement therapy helps deliver nicotine to your body without the negative effects and risks of smoking. Nicotine replacement therapy includes nicotine gum, lozenges, inhalers, nasal sprays, and skin patches. Some may be available over-the-counter and others require a prescription.  Antidepressant medicine helps people abstain from smoking, but how this works is unknown. This medicine is available by prescription.  Nicotinic receptor partial agonist medicine simulates the effect of nicotine in your brain. This medicine is available by prescription. Ask your caregiver for advice about which medicines to use and how to use them based on your health history. Your caregiver will tell you what side effects to look out for if you choose to be on a medicine or therapy. Carefully read the information on the package. Do not use any other product containing nicotine while using a nicotine replacement product.  RELAPSE OR DIFFICULT SITUATIONS Most relapses occur within the first 3 months after quitting. Do not be discouraged if you start smoking again. Remember, most people try several times before finally quitting. You may have symptoms of withdrawal because your body is used to nicotine. You may crave cigarettes, be irritable, feel very hungry, cough often, get headaches, or have difficulty concentrating. The withdrawal symptoms are only temporary. They are strongest when you first quit, but they will go away within  10 14 days. To reduce the chances of relapse, try to:  Avoid drinking alcohol. Drinking lowers your chances of successfully quitting.  Reduce the amount of caffeine you consume. Once you quit smoking, the amount of caffeine in your body increases and can give you symptoms, such as a rapid heartbeat, sweating, and anxiety.  Avoid smokers because they can make you want to smoke.  Do not let weight gain distract you. Many smokers will gain weight when they quit, usually less than 10 pounds. Eat a healthy diet and stay active. You can always lose the weight gained after you quit.  Find ways to improve your mood other than smoking. FOR MORE INFORMATION  www.smokefree.gov  Document Released: 03/21/2001 Document Revised: 09/26/2011 Document Reviewed: 07/06/2011 [facility] Patient Information 2013 [place], [place]. Smoking and Your Digestive System Cigarette smoking causes many life-threatening diseases. These include lung cancer, other cancers, emphysema, and heart disease. About 430,000 deaths each year are directly caused by cigarette smoking. Smoking results in disease-causing changes in all parts of the body. This includes the digestive system. This can cause serious effects, since the digestive system converts  foods into nutrients the body needs to live. Smoking has been shown to have harmful effects on all parts of the digestive system. It adds to common disorders, such as heartburn and peptic ulcers. It also increases the risk of Crohn's disease, and possibly gallstones. Smoking seems to affect the liver by changing the way it handles drugs and alcohol and removes them. In fact, there seems to be enough evidence to stop smoking based solely on digestive distress. Some of the harmful effects of smoking are:  Heartburn (acid reflux).  Heartburn happens when acidic juices from the stomach splash into the esophagus, which has a more sensitive and less acid-resistant lining than the stomach. Normally, a  muscular valve at the lower end of the esophagus keeps out the acid solution in the stomach. Smoking decreases the strength of the esophageal valve and its ability to keep out acidic stomach contents. This allows stomach acid reflux, or flow backward into the esophagus.  Smoking also seems to promote the movement of bile salts from the intestine to the stomach. This makes stomach acids more harmful.  A peptic ulcer is an open sore in the lining of the stomach or duodenum (first part of the small intestine). The exact cause of ulcers is not known. A link between smoking cigarettes and ulcers, especially duodenal ulcers, does exist. Ulcers are more likely to occur, less likely to heal, and more likely to cause death in smokers than in nonsmokers.  Some research suggests that smoking might increase a person's risk of infection with the bacterium Helicobacter pylori (H. pylori). Most peptic ulcers are caused by this bacterium.  Stomach acid is also important in causing ulcers. Normally, most of this acid is buffered (neutralized) by the food we eat. Most of the unbuffered acid that enters the duodenum is quickly neutralized by sodium bicarbonate. This is a naturally occurring alkali, produced by the pancreas. Some studies show that smoking reduces the bicarbonate produced by the pancreas. This interferes with the neutralization of acid in the duodenum. Other studies suggest that chronic cigarette smoking may increase the amount of acid produced by the stomach.  Whatever causes the link between smoking and ulcers, two points have been repeatedly shown. People who smoke are more likely to develop an ulcer, especially a duodenal ulcer. Ulcers in smokers are less likely to heal quickly in response to otherwise effective treatment. This research strongly suggests that a person with an ulcer should stop smoking.  The liver is an important organ with many tasks. One task of the liver is to prepare drugs, alcohol,  and other toxins for elimination (removal) from the body. There is evidence that smoking alters the ability of the liver to effectively handle such substances. In some cases, this may influence the dose of medicine needed to treat an illness. Some research suggests that smoking can aggravate and speed up the course of liver disease caused by excessive alcohol intake.  Studies have shown that smokers have weaker or less frequent stomach contractions while smoking, which can cause less efficient digestion.  Crohn's disease causes inflammation deep in the lining of the intestine. The disease causes pain and diarrhea. It usually affects the small intestine, but it can occur anywhere in the digestive tract. Research shows that current and former smokers have a higher risk of developing Crohn's disease than nonsmokers. Among people with the disease, smoking is linked with a higher rate of relapse, repeat surgery, and immunosuppressive treatment. In all areas, the risk for women who  are current or former smokers is slightly higher than for men. Why smoking increases the risk of Crohn's disease is unknown.  Several studies suggest that smoking may increase the risk of developing gallstones. The risk may be higher for women. Research results on this topic are not consistent. More studies are needed.  Oral (lip and mouth) cancer and cancer of the pharynx (throat) and the esophagus are caused by smoking. Smoking may be associated with pancreatic cancer.  Some of the effects of smoking on the digestive system seem to be short-lived. For example, the effect of smoking on bicarbonate production by the pancreas does not appear to last. Half an hour after smoking, the production of bicarbonate returns to normal. The effects of smoking on how the liver handles drugs also disappear when a person stops smoking. However, people who no longer smoke still remain at risk for Crohn's disease. Document Released: 03/09/2004  Document Revised: 06/19/2011 Document Reviewed: 01/11/2009 [facility] Patient Information 2013 [place], [place].

## 2012-03-12 NOTE — Patient Instructions (Signed)
Patient will need to take Evista for osteopenia

## 2012-03-12 NOTE — Assessment & Plan Note (Signed)
Trigger Point Injection   Pre-operative diagnosis: fibromyalgia  Post-operative diagnosis: fibromyalgia  After risks and benefits were explained including bleeding, infection, worsening of the pain, damage to the area being injected, weakness, allergic reaction to medications, vascular injection, and nerve damage, signed consent was obtained.  All questions were answered.    The area of the trigger point was identified and the skin prepped three times with alcohol and the alcohol allowed to dry.  Next, a 25 gauge 0.5 inch needle was placed in the area of the trigger point.  Once reproduction of the pain was elicited and negative aspiration confirmed, the trigger point was injected and the needle removed.    The patient did tolerate the procedure well and there were not complications.    Medication used:  60 mg Toradol; 40 ml 1% Mepivicaine, 45 ML's of 1% lidocaine with epinephrine  Trigger points injected: Greater than 20 over more than 5 muscle groups.    Trigger point(s) location(s):  bilateral

## 2012-03-12 NOTE — Progress Notes (Signed)
  Subjective:    Patient ID: April Price, female    DOB: 27-Feb-1956, 56 y.o.   MRN: [id number]  HPI #1 patient's here for trigger point injections. See note. #2 fibromyalgia. Patient insurance limits her options for muscle relaxers. She states that the Zanaflex 3 times a day is not controlling her muscle spasms. She also requests refills on her Duragesic  patches. She states that she has had to stretch the patches before and wondered if she can have a few extra. She needs refill of her tramadol and wants prescription for Motrin as well. She also requested Motrin 4 times a day 800 mg. #3reflux. The Zantac alone is not helping enough with her indigestion and she wants a new  prescription for PPI that her insurance will pay for.  #4 refill trazodone is requested.  #5 patient continues to smoke.  Review of Systems    BP 130/70  Pulse 90  Ht 5\' 4"  (1.626 m)  Wt 180 lb (81.647 kg)  BMI 30.90 kg/m2 Objective:   Physical Exam  Vitals reviewed. Constitutional: She is oriented to person, place, and time. She appears well-developed and well-nourished.  Neurological: She is alert and oriented to person, place, and time.  Skin: Skin is warm.  Psychiatric: She has a normal mood and affect. Her behavior is normal.      Assessment & Plan:  #1 trigger point injections done. Followup here in 3 months for repeat injection.  #2 fibromyalgia. Increase Zanaflex to 4 times a day. Of the 3 scripts for Duragesic 1 will be for 15 patches and the other 2 for 10. Motrin will be kept at 3 times a day #4. Will refill her tramadol.  #3 place her on Nexium 40 mg by mouth for indigestion.  #4 requests refill for his sleep medicine trazodone. She continues to take 2 tablets at night.  #5 information given about tobacco cessation again.   Patient to return in 3 months for reinjection of trigger points in to establish herself or with the other providers here. Patient is indicated she wants to continue  seeing me since I would not be associated with this practice after December.

## 2012-03-14 ENCOUNTER — Telehealth: Payer: Self-pay

## 2012-03-14 NOTE — Telephone Encounter (Signed)
April Price is returning a call from yesterday. She does not know who may have called her and I cannot find anything in her chart. I am routing message to Marcelino Duster just in case she knows.

## 2012-03-14 NOTE — Telephone Encounter (Signed)
Informed Pt again of bone density results.

## 2012-05-14 DIAGNOSIS — F331 Major depressive disorder, recurrent, moderate: Secondary | ICD-10-CM | POA: Diagnosis not present

## 2012-06-20 ENCOUNTER — Ambulatory Visit: Payer: Self-pay

## 2012-06-20 DIAGNOSIS — Z79899 Other long term (current) drug therapy: Secondary | ICD-10-CM | POA: Diagnosis not present

## 2012-06-20 DIAGNOSIS — IMO0001 Reserved for inherently not codable concepts without codable children: Secondary | ICD-10-CM | POA: Diagnosis not present

## 2012-07-17 DIAGNOSIS — F331 Major depressive disorder, recurrent, moderate: Secondary | ICD-10-CM | POA: Diagnosis not present

## 2012-08-13 DIAGNOSIS — F331 Major depressive disorder, recurrent, moderate: Secondary | ICD-10-CM | POA: Diagnosis not present

## 2012-10-15 ENCOUNTER — Ambulatory Visit: Payer: Self-pay

## 2012-10-15 DIAGNOSIS — M545 Low back pain, unspecified: Secondary | ICD-10-CM | POA: Diagnosis not present

## 2012-10-15 DIAGNOSIS — F329 Major depressive disorder, single episode, unspecified: Secondary | ICD-10-CM | POA: Diagnosis not present

## 2012-10-15 DIAGNOSIS — Z90710 Acquired absence of both cervix and uterus: Secondary | ICD-10-CM | POA: Diagnosis not present

## 2012-10-15 DIAGNOSIS — I10 Essential (primary) hypertension: Secondary | ICD-10-CM | POA: Diagnosis not present

## 2012-10-15 DIAGNOSIS — Z79899 Other long term (current) drug therapy: Secondary | ICD-10-CM | POA: Diagnosis not present

## 2012-10-15 DIAGNOSIS — IMO0001 Reserved for inherently not codable concepts without codable children: Secondary | ICD-10-CM | POA: Diagnosis not present

## 2012-10-15 DIAGNOSIS — R5382 Chronic fatigue, unspecified: Secondary | ICD-10-CM | POA: Diagnosis not present

## 2012-10-22 DIAGNOSIS — F331 Major depressive disorder, recurrent, moderate: Secondary | ICD-10-CM | POA: Diagnosis not present

## 2012-11-06 DIAGNOSIS — M549 Dorsalgia, unspecified: Secondary | ICD-10-CM | POA: Diagnosis not present

## 2012-11-07 ENCOUNTER — Ambulatory Visit: Payer: Self-pay | Admitting: General Surgery

## 2012-11-13 DIAGNOSIS — M549 Dorsalgia, unspecified: Secondary | ICD-10-CM | POA: Diagnosis not present

## 2012-11-13 DIAGNOSIS — Z006 Encounter for examination for normal comparison and control in clinical research program: Secondary | ICD-10-CM | POA: Diagnosis not present

## 2012-11-14 DIAGNOSIS — F331 Major depressive disorder, recurrent, moderate: Secondary | ICD-10-CM | POA: Diagnosis not present

## 2012-11-20 DIAGNOSIS — M549 Dorsalgia, unspecified: Secondary | ICD-10-CM | POA: Diagnosis not present

## 2012-12-18 ENCOUNTER — Other Ambulatory Visit: Payer: Self-pay | Admitting: Neurosurgery

## 2012-12-18 DIAGNOSIS — M549 Dorsalgia, unspecified: Secondary | ICD-10-CM | POA: Diagnosis not present

## 2012-12-31 ENCOUNTER — Ambulatory Visit: Payer: Self-pay | Admitting: General Surgery

## 2012-12-31 DIAGNOSIS — Z1231 Encounter for screening mammogram for malignant neoplasm of breast: Secondary | ICD-10-CM | POA: Diagnosis not present

## 2013-01-01 ENCOUNTER — Encounter: Payer: Self-pay | Admitting: General Surgery

## 2013-01-07 ENCOUNTER — Ambulatory Visit: Payer: Self-pay | Admitting: Neurosurgery

## 2013-01-07 DIAGNOSIS — Z981 Arthrodesis status: Secondary | ICD-10-CM | POA: Diagnosis not present

## 2013-01-07 DIAGNOSIS — M47817 Spondylosis without myelopathy or radiculopathy, lumbosacral region: Secondary | ICD-10-CM | POA: Diagnosis not present

## 2013-01-08 ENCOUNTER — Ambulatory Visit (INDEPENDENT_AMBULATORY_CARE_PROVIDER_SITE_OTHER): Payer: Medicare Other | Admitting: General Surgery

## 2013-01-08 ENCOUNTER — Encounter: Payer: Self-pay | Admitting: General Surgery

## 2013-01-08 VITALS — BP 142/82 | HR 86 | Resp 16 | Ht 64.0 in | Wt 180.0 lb

## 2013-01-08 DIAGNOSIS — M549 Dorsalgia, unspecified: Secondary | ICD-10-CM | POA: Diagnosis not present

## 2013-01-08 DIAGNOSIS — Z1239 Encounter for other screening for malignant neoplasm of breast: Secondary | ICD-10-CM

## 2013-01-08 DIAGNOSIS — Z87898 Personal history of other specified conditions: Secondary | ICD-10-CM | POA: Insufficient documentation

## 2013-01-08 NOTE — Patient Instructions (Addendum)
Patient to return in one year screening mammogram. Patient to continued to do monthly breast checks.

## 2013-01-08 NOTE — Progress Notes (Signed)
Patient ID: April Price, female   DOB: 01-03-1956, 57 y.o.   MRN: [id number]  Chief Complaint  Patient presents with  . Follow-up    mammogram    HPI April Price is a 57 y.o. female who presents for a breast evaluation. The most recent mammogram was done on  01/01/13. Patient does perform regular self breast checks and gets regular mammograms done.    HPI  Past Medical History  Diagnosis Date  . Chronic fatigue   . Depression   . GERD (gastroesophageal reflux disease)   . Asthma   . Allergy   . Neuromuscular disorder   . Fibromyalgia muscle pain   . Depression screen   . Fibromyalgia   . Hypertension   . Diffuse cystic mastopathy   . Arthritis     Past Surgical History  Procedure Laterality Date  . Back surgery    . Abdominal hysterectomy    . Cholecystectomy    . Nasal sinus surgery    . Cataract    . Breast biopsy Right   . Spine surgery  2003, 2012  . Tympanostomy tube placement    . Nasal sinus surgery    . Eye surgery Bilateral 2011    cataract  . Colonoscopy  2006    Family History  Problem Relation Age of Onset  . Heart disease Mother   . Heart disease Father   . Vascular Disease Sister   . Heart disease Brother     Social History History  Substance Use Topics  . Smoking status: Former Smoker -- 0.25 packs/day for 30 years    Types: Cigarettes  . Smokeless tobacco: Never Used  . Alcohol Use: No    Allergies  Allergen Reactions  . Morphine And Related   . Penicillins   . Shrimp [Shellfish Allergy] Hives  . Tape     irritation  . Tetanus Toxoids   . Tylenol [Acetaminophen]     Current Outpatient Prescriptions  Medication Sig Dispense Refill  . Carboxymethylcellul-Glycerin (EQ LUBRICATING EYE DROPS) 0.5-0.9 % SOLN Apply 1 drop to eye 2 (two) times daily as needed.  15 mL  11  . cefUROXime (CEFTIN) 500 MG tablet Take 1 tablet (500 mg total) by mouth 2 (two) times daily.  20 tablet  0  . conjugated estrogens (PREMARIN)  vaginal cream Place vaginally daily.  42.5 g  12  . EPINEPHrine (EPIPEN JR) 0.15 MG/0.3ML injection Inject 0.3 mLs (0.15 mg total) into the muscle as needed.  1 each  6  . esomeprazole (NEXIUM) 40 MG capsule Take 1 capsule (40 mg total) by mouth daily.  30 capsule  6  . fentaNYL (DURAGESIC) 25 MCG/HR Place 1 patch (25 mcg total) onto the skin every 3 (three) days. Patient given extra 5 per 3 months for events  10 patch  0  . ibuprofen (ADVIL,MOTRIN) 800 MG tablet Take 1 tablet (800 mg total) by mouth every 8 (eight) hours as needed for pain.  90 tablet  6  . prednisoLONE acetate (PRED FORTE) 1 % ophthalmic suspension 1 drop 4 (four) times daily.        . raloxifene (EVISTA) 60 MG tablet Take 1 tablet (60 mg total) by mouth daily.  30 tablet  11  . ranitidine (ZANTAC) 150 MG tablet Take 150 mg by mouth 2 (two) times daily.        Marland Kitchen tiZANidine (ZANAFLEX) 4 MG tablet Take 1 tablet (4 mg total) by mouth 4 (four) times daily  as needed.  120 tablet  3  . traMADol (ULTRAM) 50 MG tablet 1-2 tablets po q 8hrs prn  120 tablet  3  . traMADol (ULTRAM) 50 MG tablet Take 1 tablet (50 mg total) by mouth every 6 (six) hours as needed for pain.  120 tablet  3  . busPIRone (BUSPAR) 15 MG tablet Take 1 tablet (15 mg total) by mouth 2 (two) times daily as needed.  60 tablet  4  . hydrochlorothiazide (HYDRODIURIL) 25 MG tablet Take 1 tablet (25 mg total) by mouth daily.  30 tablet  11  . nicotine (NICOTROL) 10 MG inhaler Inhale 1 puff into the lungs as needed for smoking cessation.  42 each  11  . traZODone (DESYREL) 150 MG tablet Take 2 tablets (300 mg total) by mouth at bedtime. Start off w/ a third of a tablet and increase by 1/3 every 2 weeks as needed  60 tablet  6   No current facility-administered medications for this visit.    Review of Systems Review of Systems  Constitutional: Negative.   Respiratory: Negative.   Cardiovascular: Negative.     Blood pressure 142/82, pulse 86, resp. rate 16, height 5'  4" (1.626 m), weight 180 lb (81.647 kg).  Physical Exam Physical Exam  Constitutional: She is oriented to person, place, and time. She appears well-developed and well-nourished.  Eyes: Conjunctivae are normal. No scleral icterus.  Neck: Neck supple. No mass and no thyromegaly present.  Cardiovascular: Normal rate, regular rhythm and normal heart sounds.   Pulmonary/Chest: Breath sounds normal. Right breast exhibits no inverted nipple, no mass, no nipple discharge, no skin change and no tenderness. Left breast exhibits no inverted nipple, no mass, no nipple discharge, no skin change and no tenderness.  Abdominal: Bowel sounds are normal.  Lymphadenopathy:    She has no cervical adenopathy.    She has no axillary adenopathy.  Neurological: She is alert and oriented to person, place, and time.  Skin: Skin is warm and dry.    Data Reviewed Mammogram reviewd  Assessment    Stable exam     Plan    Patient to return in one year screening mammogram.        SANKAR,SEEPLAPUTHUR G 01/10/2013, 8:53 PM

## 2013-01-09 ENCOUNTER — Ambulatory Visit: Payer: Self-pay

## 2013-01-09 DIAGNOSIS — G8929 Other chronic pain: Secondary | ICD-10-CM | POA: Diagnosis not present

## 2013-01-09 DIAGNOSIS — R5382 Chronic fatigue, unspecified: Secondary | ICD-10-CM | POA: Diagnosis not present

## 2013-01-09 DIAGNOSIS — Z90710 Acquired absence of both cervix and uterus: Secondary | ICD-10-CM | POA: Diagnosis not present

## 2013-01-09 DIAGNOSIS — IMO0001 Reserved for inherently not codable concepts without codable children: Secondary | ICD-10-CM | POA: Diagnosis not present

## 2013-01-09 DIAGNOSIS — F329 Major depressive disorder, single episode, unspecified: Secondary | ICD-10-CM | POA: Diagnosis not present

## 2013-01-09 DIAGNOSIS — K219 Gastro-esophageal reflux disease without esophagitis: Secondary | ICD-10-CM | POA: Diagnosis not present

## 2013-01-09 DIAGNOSIS — M549 Dorsalgia, unspecified: Secondary | ICD-10-CM | POA: Diagnosis not present

## 2013-01-09 DIAGNOSIS — Z79899 Other long term (current) drug therapy: Secondary | ICD-10-CM | POA: Diagnosis not present

## 2013-01-10 ENCOUNTER — Encounter: Payer: Self-pay | Admitting: General Surgery

## 2013-01-21 DIAGNOSIS — F331 Major depressive disorder, recurrent, moderate: Secondary | ICD-10-CM | POA: Diagnosis not present

## 2013-01-21 DIAGNOSIS — M5126 Other intervertebral disc displacement, lumbar region: Secondary | ICD-10-CM | POA: Diagnosis not present

## 2013-01-21 DIAGNOSIS — IMO0002 Reserved for concepts with insufficient information to code with codable children: Secondary | ICD-10-CM | POA: Diagnosis not present

## 2013-01-21 DIAGNOSIS — M5137 Other intervertebral disc degeneration, lumbosacral region: Secondary | ICD-10-CM | POA: Diagnosis not present

## 2013-02-11 DIAGNOSIS — F331 Major depressive disorder, recurrent, moderate: Secondary | ICD-10-CM | POA: Diagnosis not present

## 2013-02-13 ENCOUNTER — Ambulatory Visit: Payer: Self-pay

## 2013-02-13 DIAGNOSIS — Z9089 Acquired absence of other organs: Secondary | ICD-10-CM | POA: Diagnosis not present

## 2013-02-13 DIAGNOSIS — R5382 Chronic fatigue, unspecified: Secondary | ICD-10-CM | POA: Diagnosis not present

## 2013-02-13 DIAGNOSIS — F17200 Nicotine dependence, unspecified, uncomplicated: Secondary | ICD-10-CM | POA: Diagnosis not present

## 2013-02-13 DIAGNOSIS — IMO0001 Reserved for inherently not codable concepts without codable children: Secondary | ICD-10-CM | POA: Diagnosis not present

## 2013-02-13 DIAGNOSIS — G8929 Other chronic pain: Secondary | ICD-10-CM | POA: Diagnosis not present

## 2013-02-13 DIAGNOSIS — Z79899 Other long term (current) drug therapy: Secondary | ICD-10-CM | POA: Diagnosis not present

## 2013-02-13 DIAGNOSIS — F329 Major depressive disorder, single episode, unspecified: Secondary | ICD-10-CM | POA: Diagnosis not present

## 2013-02-13 DIAGNOSIS — Z90710 Acquired absence of both cervix and uterus: Secondary | ICD-10-CM | POA: Diagnosis not present

## 2013-02-14 DIAGNOSIS — Z23 Encounter for immunization: Secondary | ICD-10-CM | POA: Diagnosis not present

## 2013-02-18 DIAGNOSIS — M5137 Other intervertebral disc degeneration, lumbosacral region: Secondary | ICD-10-CM | POA: Diagnosis not present

## 2013-02-18 DIAGNOSIS — IMO0002 Reserved for concepts with insufficient information to code with codable children: Secondary | ICD-10-CM | POA: Diagnosis not present

## 2013-03-03 DIAGNOSIS — F331 Major depressive disorder, recurrent, moderate: Secondary | ICD-10-CM | POA: Diagnosis not present

## 2013-03-24 DIAGNOSIS — M549 Dorsalgia, unspecified: Secondary | ICD-10-CM | POA: Diagnosis not present

## 2013-03-25 ENCOUNTER — Ambulatory Visit: Payer: Self-pay

## 2013-03-25 DIAGNOSIS — G8929 Other chronic pain: Secondary | ICD-10-CM | POA: Diagnosis not present

## 2013-03-25 DIAGNOSIS — R5382 Chronic fatigue, unspecified: Secondary | ICD-10-CM | POA: Diagnosis not present

## 2013-03-25 DIAGNOSIS — F329 Major depressive disorder, single episode, unspecified: Secondary | ICD-10-CM | POA: Diagnosis not present

## 2013-03-25 DIAGNOSIS — Z79899 Other long term (current) drug therapy: Secondary | ICD-10-CM | POA: Diagnosis not present

## 2013-03-25 DIAGNOSIS — F17200 Nicotine dependence, unspecified, uncomplicated: Secondary | ICD-10-CM | POA: Diagnosis not present

## 2013-03-25 DIAGNOSIS — F331 Major depressive disorder, recurrent, moderate: Secondary | ICD-10-CM | POA: Diagnosis not present

## 2013-03-25 DIAGNOSIS — M545 Low back pain, unspecified: Secondary | ICD-10-CM | POA: Diagnosis not present

## 2013-03-25 DIAGNOSIS — IMO0001 Reserved for inherently not codable concepts without codable children: Secondary | ICD-10-CM | POA: Diagnosis not present

## 2013-03-25 DIAGNOSIS — M5137 Other intervertebral disc degeneration, lumbosacral region: Secondary | ICD-10-CM | POA: Diagnosis not present

## 2013-04-15 DIAGNOSIS — F331 Major depressive disorder, recurrent, moderate: Secondary | ICD-10-CM | POA: Diagnosis not present

## 2013-05-19 DIAGNOSIS — F331 Major depressive disorder, recurrent, moderate: Secondary | ICD-10-CM | POA: Diagnosis not present

## 2013-06-26 ENCOUNTER — Telehealth: Payer: Self-pay | Admitting: Family Medicine

## 2013-06-26 ENCOUNTER — Ambulatory Visit: Payer: Self-pay

## 2013-06-26 DIAGNOSIS — R5382 Chronic fatigue, unspecified: Secondary | ICD-10-CM | POA: Diagnosis not present

## 2013-06-26 DIAGNOSIS — F3289 Other specified depressive episodes: Secondary | ICD-10-CM | POA: Diagnosis not present

## 2013-06-26 DIAGNOSIS — F17200 Nicotine dependence, unspecified, uncomplicated: Secondary | ICD-10-CM | POA: Diagnosis not present

## 2013-06-26 DIAGNOSIS — IMO0001 Reserved for inherently not codable concepts without codable children: Secondary | ICD-10-CM | POA: Diagnosis not present

## 2013-06-26 DIAGNOSIS — N952 Postmenopausal atrophic vaginitis: Secondary | ICD-10-CM

## 2013-06-26 DIAGNOSIS — Z79899 Other long term (current) drug therapy: Secondary | ICD-10-CM | POA: Diagnosis not present

## 2013-06-26 DIAGNOSIS — M545 Low back pain, unspecified: Secondary | ICD-10-CM | POA: Diagnosis not present

## 2013-06-26 DIAGNOSIS — M5137 Other intervertebral disc degeneration, lumbosacral region: Secondary | ICD-10-CM | POA: Diagnosis not present

## 2013-06-26 DIAGNOSIS — G9332 Myalgic encephalomyelitis/chronic fatigue syndrome: Secondary | ICD-10-CM | POA: Diagnosis not present

## 2013-06-26 DIAGNOSIS — Z90710 Acquired absence of both cervix and uterus: Secondary | ICD-10-CM | POA: Diagnosis not present

## 2013-06-26 DIAGNOSIS — F329 Major depressive disorder, single episode, unspecified: Secondary | ICD-10-CM | POA: Diagnosis not present

## 2013-07-01 DIAGNOSIS — F331 Major depressive disorder, recurrent, moderate: Secondary | ICD-10-CM | POA: Diagnosis not present

## 2013-07-22 DIAGNOSIS — F331 Major depressive disorder, recurrent, moderate: Secondary | ICD-10-CM | POA: Diagnosis not present

## 2013-08-13 DIAGNOSIS — F331 Major depressive disorder, recurrent, moderate: Secondary | ICD-10-CM | POA: Diagnosis not present

## 2013-09-02 DIAGNOSIS — F331 Major depressive disorder, recurrent, moderate: Secondary | ICD-10-CM | POA: Diagnosis not present

## 2013-10-07 DIAGNOSIS — F331 Major depressive disorder, recurrent, moderate: Secondary | ICD-10-CM | POA: Diagnosis not present

## 2013-10-23 ENCOUNTER — Ambulatory Visit: Payer: Self-pay

## 2013-10-23 DIAGNOSIS — G8929 Other chronic pain: Secondary | ICD-10-CM | POA: Diagnosis not present

## 2013-10-23 DIAGNOSIS — Z79899 Other long term (current) drug therapy: Secondary | ICD-10-CM | POA: Diagnosis not present

## 2013-10-23 DIAGNOSIS — M549 Dorsalgia, unspecified: Secondary | ICD-10-CM | POA: Diagnosis not present

## 2013-10-23 DIAGNOSIS — R5383 Other fatigue: Secondary | ICD-10-CM | POA: Diagnosis not present

## 2013-10-23 DIAGNOSIS — F329 Major depressive disorder, single episode, unspecified: Secondary | ICD-10-CM | POA: Diagnosis not present

## 2013-10-23 DIAGNOSIS — Z90710 Acquired absence of both cervix and uterus: Secondary | ICD-10-CM | POA: Diagnosis not present

## 2013-10-23 DIAGNOSIS — Z9089 Acquired absence of other organs: Secondary | ICD-10-CM | POA: Diagnosis not present

## 2013-10-23 DIAGNOSIS — F17200 Nicotine dependence, unspecified, uncomplicated: Secondary | ICD-10-CM | POA: Diagnosis not present

## 2013-10-23 DIAGNOSIS — R5381 Other malaise: Secondary | ICD-10-CM | POA: Diagnosis not present

## 2013-10-23 DIAGNOSIS — F3289 Other specified depressive episodes: Secondary | ICD-10-CM | POA: Diagnosis not present

## 2013-11-04 DIAGNOSIS — F331 Major depressive disorder, recurrent, moderate: Secondary | ICD-10-CM | POA: Diagnosis not present

## 2013-11-25 DIAGNOSIS — F331 Major depressive disorder, recurrent, moderate: Secondary | ICD-10-CM | POA: Diagnosis not present

## 2014-01-08 ENCOUNTER — Ambulatory Visit: Payer: Self-pay | Admitting: General Surgery

## 2014-01-08 ENCOUNTER — Encounter: Payer: Self-pay | Admitting: General Surgery

## 2014-01-08 DIAGNOSIS — Z1231 Encounter for screening mammogram for malignant neoplasm of breast: Secondary | ICD-10-CM | POA: Diagnosis not present

## 2014-01-19 ENCOUNTER — Ambulatory Visit: Payer: Medicare Other | Admitting: General Surgery

## 2014-01-27 DIAGNOSIS — F331 Major depressive disorder, recurrent, moderate: Secondary | ICD-10-CM | POA: Diagnosis not present

## 2014-02-05 ENCOUNTER — Ambulatory Visit: Payer: Self-pay

## 2014-02-05 DIAGNOSIS — M797 Fibromyalgia: Secondary | ICD-10-CM | POA: Diagnosis not present

## 2014-02-05 DIAGNOSIS — M545 Low back pain: Secondary | ICD-10-CM | POA: Diagnosis not present

## 2014-02-05 DIAGNOSIS — R5382 Chronic fatigue, unspecified: Secondary | ICD-10-CM | POA: Diagnosis not present

## 2014-02-06 DIAGNOSIS — Z23 Encounter for immunization: Secondary | ICD-10-CM | POA: Diagnosis not present

## 2014-02-09 ENCOUNTER — Encounter: Payer: Self-pay | Admitting: General Surgery

## 2014-02-24 DIAGNOSIS — F331 Major depressive disorder, recurrent, moderate: Secondary | ICD-10-CM | POA: Diagnosis not present

## 2014-02-25 ENCOUNTER — Encounter: Payer: Self-pay | Admitting: *Deleted

## 2014-03-31 DIAGNOSIS — F331 Major depressive disorder, recurrent, moderate: Secondary | ICD-10-CM | POA: Diagnosis not present

## 2014-05-19 DIAGNOSIS — F331 Major depressive disorder, recurrent, moderate: Secondary | ICD-10-CM | POA: Diagnosis not present

## 2014-06-23 DIAGNOSIS — F331 Major depressive disorder, recurrent, moderate: Secondary | ICD-10-CM | POA: Diagnosis not present

## 2014-07-21 DIAGNOSIS — F331 Major depressive disorder, recurrent, moderate: Secondary | ICD-10-CM | POA: Diagnosis not present

## 2014-08-04 ENCOUNTER — Ambulatory Visit
Admit: 2014-08-04 | Disposition: A | Discharge status: Home / Self Care | Payer: Self-pay | Attending: Family Medicine | Admitting: Family Medicine

## 2014-08-04 DIAGNOSIS — F331 Major depressive disorder, recurrent, moderate: Secondary | ICD-10-CM | POA: Diagnosis not present

## 2014-08-04 DIAGNOSIS — M545 Low back pain: Secondary | ICD-10-CM | POA: Diagnosis not present

## 2014-08-04 DIAGNOSIS — Z79899 Other long term (current) drug therapy: Secondary | ICD-10-CM | POA: Diagnosis not present

## 2014-08-04 DIAGNOSIS — G8929 Other chronic pain: Secondary | ICD-10-CM | POA: Diagnosis not present

## 2014-08-04 DIAGNOSIS — M797 Fibromyalgia: Secondary | ICD-10-CM | POA: Diagnosis not present

## 2014-09-01 DIAGNOSIS — F331 Major depressive disorder, recurrent, moderate: Secondary | ICD-10-CM | POA: Diagnosis not present

## 2014-09-15 DIAGNOSIS — F331 Major depressive disorder, recurrent, moderate: Secondary | ICD-10-CM | POA: Diagnosis not present

## 2014-11-27 NOTE — ED Notes (Signed)
No new note  To provide open for years. Action to close note.   Frederich Cha, MD 11/27/14 [phone]

## 2014-12-02 DIAGNOSIS — F331 Major depressive disorder, recurrent, moderate: Secondary | ICD-10-CM | POA: Diagnosis not present

## 2014-12-10 ENCOUNTER — Ambulatory Visit
Admission: EM | Admit: 2014-12-10 | Discharge: 2014-12-10 | Disposition: A | Discharge status: Home / Self Care | Payer: Medicare Other | Attending: Family Medicine | Admitting: Family Medicine

## 2014-12-10 DIAGNOSIS — F329 Major depressive disorder, single episode, unspecified: Secondary | ICD-10-CM

## 2014-12-10 DIAGNOSIS — F32A Depression, unspecified: Secondary | ICD-10-CM

## 2014-12-10 DIAGNOSIS — M549 Dorsalgia, unspecified: Secondary | ICD-10-CM | POA: Diagnosis not present

## 2014-12-10 DIAGNOSIS — G8929 Other chronic pain: Secondary | ICD-10-CM

## 2014-12-10 DIAGNOSIS — M797 Fibromyalgia: Secondary | ICD-10-CM | POA: Diagnosis not present

## 2014-12-10 MED ORDER — TRAMADOL HCL 50 MG PO TABS: 50.0000 mg | ORAL_TABLET | Freq: Four times a day (QID) | ORAL | Status: DC | PRN

## 2014-12-10 MED ORDER — FENTANYL 25 MCG/HR TD PT72: 25.0000 ug | MEDICATED_PATCH | TRANSDERMAL | Status: DC

## 2014-12-10 MED ORDER — IBUPROFEN 800 MG PO TABS: 800.0000 mg | ORAL_TABLET | Freq: Three times a day (TID) | ORAL | Status: DC

## 2014-12-10 MED ORDER — TIZANIDINE HCL 4 MG PO TABS: 8.0000 mg | ORAL_TABLET | Freq: Three times a day (TID) | ORAL | Status: DC | PRN

## 2014-12-10 MED ORDER — ESOMEPRAZOLE MAGNESIUM 40 MG PO CPDR: 40.0000 mg | DELAYED_RELEASE_CAPSULE | Freq: Every day | ORAL | Status: DC

## 2014-12-10 MED ORDER — RANITIDINE HCL 150 MG PO TABS: 150.0000 mg | ORAL_TABLET | Freq: Two times a day (BID) | ORAL | Status: DC

## 2014-12-10 NOTE — ED Notes (Signed)
Pt states "I am here to see Dr. Alveta Heimlich for fibromyalgia medication refills."

## 2014-12-10 NOTE — Discharge Instructions (Signed)
Back Pain, Adult Back pain is very common. The pain often gets better over time. The cause of back pain is usually not dangerous. Most people can learn to manage their back pain on their own.  HOME CARE   Stay active. Start with short walks on flat ground if you can. Try to walk farther each day.  Do not sit, drive, or stand in one place for more than 30 minutes. Do not stay in bed.  Do not avoid exercise or work. Activity can help your back heal faster.  Be careful when you bend or lift an object. Bend at your knees, keep the object close to you, and do not twist.  Sleep on a firm mattress. Lie on your side, and bend your knees. If you lie on your back, put a pillow under your knees.  Only take medicines as told by your doctor.  Put ice on the injured area.  Put ice in a plastic bag.  Place a towel between your skin and the bag.  Leave the ice on for 15-20 minutes, 03-04 times a day for the first 2 to 3 days. After that, you can switch between ice and heat packs.  Ask your doctor about back exercises or massage.  Avoid feeling anxious or stressed. Find good ways to deal with stress, such as exercise. GET HELP RIGHT AWAY IF:   Your pain does not go away with rest or medicine.  Your pain does not go away in 1 week.  You have new problems.  You do not feel well.  The pain spreads into your legs.  You cannot control when you poop (bowel movement) or pee (urinate).  Your arms or legs feel weak or lose feeling (numbness).  You feel sick to your stomach (nauseous) or throw up (vomit).  You have belly (abdominal) pain.  You feel like you may pass out (faint). MAKE SURE YOU:   Understand these instructions.  Will watch your condition.  Will get help right away if you are not doing well or get worse. Document Released: 09/13/2007 Document Revised: 06/19/2011 Document Reviewed: 07/29/2013 [facility] Patient Information 2015 [place], [place]. This information is not intended  to replace advice given to you by your health care provider. Make sure you discuss any questions you have with your health care provider.  Chronic Back Pain  When back pain lasts longer than 3 months, it is called chronic back pain.People with chronic back pain often go through certain periods that are more intense (flare-ups).  CAUSES Chronic back pain can be caused by wear and tear (degeneration) on different structures in your back. These structures include:  The bones of your spine (vertebrae) and the joints surrounding your spinal cord and nerve roots (facets).  The strong, fibrous tissues that connect your vertebrae (ligaments). Degeneration of these structures may result in pressure on your nerves. This can lead to constant pain. HOME CARE INSTRUCTIONS  Avoid bending, heavy lifting, prolonged sitting, and activities which make the problem worse.  Take brief periods of rest throughout the day to reduce your pain. Lying down or standing usually is better than sitting while you are resting.  Take over-the-counter or prescription medicines only as directed by your caregiver. SEEK IMMEDIATE MEDICAL CARE IF:   You have weakness or numbness in one of your legs or feet.  You have trouble controlling your bladder or bowels.  You have nausea, vomiting, abdominal pain, shortness of breath, or fainting. Document Released: 05/04/2004 Document Revised: 06/19/2011 Document  Reviewed: 03/11/2011 [employer] Patient Information 2015 [place], [place]. This information is not intended to replace advice given to you by your health care provider. Make sure you discuss any questions you have with your health care provider.  Fibromyalgia Fibromyalgia is a disorder that is often misunderstood. It is associated with muscular pains and tenderness that comes and goes. It is often associated with fatigue and sleep disturbances. Though it tends to be long-lasting, fibromyalgia is not life-threatening. CAUSES    The exact cause of fibromyalgia is unknown. People with certain gene types are predisposed to developing fibromyalgia and other conditions. Certain factors can play a role as triggers, such as:  Spine disorders.  Arthritis.  Severe injury (trauma) and other physical stressors.  Emotional stressors. SYMPTOMS   The main symptom is pain and stiffness in the muscles and joints, which can vary over time.  Sleep and fatigue problems. Other related symptoms may include:  Bowel and bladder problems.  Headaches.  Visual problems.  Problems with odors and noises.  Depression or mood changes.  Painful periods (dysmenorrhea).  Dryness of the skin or eyes. DIAGNOSIS  There are no specific tests for diagnosing fibromyalgia. Patients can be diagnosed accurately from the specific symptoms they have. The diagnosis is made by determining that nothing else is causing the problems. TREATMENT  There is no cure. Management includes medicines and an active, healthy lifestyle. The goal is to enhance physical fitness, decrease pain, and improve sleep. HOME CARE INSTRUCTIONS   Only take over-the-counter or prescription medicines as directed by your caregiver. Sleeping pills, tranquilizers, and pain medicines may make your problems worse.  Low-impact aerobic exercise is very important and advised for treatment. At first, it may seem to make pain worse. Gradually increasing your tolerance will overcome this feeling.  Learning relaxation techniques and how to control stress will help you. Biofeedback, visual imagery, hypnosis, muscle relaxation, yoga, and meditation are all options.  Anti-inflammatory medicines and physical therapy may provide short-term help.  Acupuncture or massage treatments may help.  Take muscle relaxant medicines as suggested by your caregiver.  Avoid stressful situations.  Plan a healthy lifestyle. This includes your diet, sleep, rest, exercise, and friends.  Find and  practice a hobby you enjoy.  Join a fibromyalgia support group for interaction, ideas, and sharing advice. This may be helpful. SEEK MEDICAL CARE IF:  You are not having good results or improvement from your treatment. FOR MORE INFORMATION  National Fibromyalgia Association: www.fmaware.[place]: www.arthritis.org Document Released: 03/27/2005 Document Revised: 06/19/2011 Document Reviewed: 07/07/2009 [facility] Patient Information 2015 [employer], [place]. This information is not intended to replace advice given to you by your health care provider. Make sure you discuss any questions you have with your health care provider.

## 2014-12-10 NOTE — ED Provider Notes (Addendum)
CSN: [id number]     Arrival date & time 12/10/14  0820 History   First MD Initiated Contact with Patient 12/10/14 [phone]     Chief Complaint  Patient presents with  . Fibromyalgia   patient's here for follow-up of fibromyalgia. She reports with the change in weather that eminent she's having some increased pain and discomfort. She is on her routine medications for pain management tramadol ibuprofen and fentanyl patch. She is also on muscle relaxer tizanidine as well Those seem to be keeping her pain under some control. Unfortunately she's not exercising. Second problem depression she still sees Dr. Norwood Levo.  She states that this under control she declines medication for that problem at this time.   (Consider location/radiation/quality/duration/timing/severity/associated sxs/prior Treatment) Patient is a 59 y.o. female presenting with musculoskeletal pain. The history is provided by the patient. No language interpreter was used.  Muscle Pain This is a chronic problem. The current episode started more than 1 week ago. The problem occurs constantly. Pertinent negatives include no chest pain, no abdominal pain and no shortness of breath. The symptoms are aggravated by exertion. The symptoms are relieved by NSAIDs and narcotics. She has tried acetaminophen for the symptoms. The treatment provided mild relief.   Should be noted the patient does still smoke and I did talk to her about smoking and the need to stop.  Also talked to patient about obtaining primary care services outside of the urgent care but she still insists this was come here. Discussed her that she should have a colonoscopy. She sees Dr. Jamal Collin  who arranges for her yearly mammograms and I suggested to her that she talks to him about him doing her colonoscopy since last one was done by me and that was over 10 years ago. Consider obtaining lab work CBC CMP and A1c but because of her Medicare status she  has regular Medicare. I cannot guarantee  that Medicare would pay for health status lab when this is not true health maintenance exam. Explained to her that there are companies that have refine the very sparse Medicare and his payment and can provide payment for routine physicals and wellness exams. Suggest that she contact one companies to talk to them but she doesn't seem to be interested in that at this time.   Past Medical History  Diagnosis Date  . Chronic fatigue   . Depression   . GERD (gastroesophageal reflux disease)   . Asthma   . Allergy   . Neuromuscular disorder   . Fibromyalgia muscle pain   . Depression screen   . Fibromyalgia   . Hypertension   . Diffuse cystic mastopathy   . Arthritis    Past Surgical History  Procedure Laterality Date  . Back surgery    . Abdominal hysterectomy    . Cholecystectomy    . Nasal sinus surgery    . Cataract    . Breast biopsy Right   . Spine surgery  2003, 2012  . Tympanostomy tube placement    . Nasal sinus surgery    . Eye surgery Bilateral 2011    cataract  . Colonoscopy  2006   Family History  Problem Relation Age of Onset  . Heart disease Mother   . Heart disease Father   . Vascular Disease Sister   . Heart disease Brother    Social History  Substance Use Topics  . Smoking status: Former Smoker -- 0.25 packs/day for 30 years    Types: Cigarettes  .  Smokeless tobacco: Never Used  . Alcohol Use: No   OB History    Gravida Para Term Preterm AB TAB SAB Ectopic Multiple Living   0 0 0 0 0 0 0 0 0 0       Obstetric Comments   Menstrual age: 75  Age 1st Pregnancy:     Review of Systems  Constitutional: Positive for fatigue.  Respiratory: Negative for shortness of breath.   Cardiovascular: Negative for chest pain.  Gastrointestinal: Negative for abdominal pain.  Musculoskeletal: Positive for myalgias and back pain. Negative for gait problem.  All other systems reviewed and are negative.   Allergies  Morphine and related; Penicillins; Shrimp; Tape;  Tetanus toxoids; and Tylenol  Home Medications   Prior to Admission medications   Medication Sig Start Date End Date Taking? Authorizing Provider  esomeprazole (NEXIUM) 40 MG capsule Take 1 capsule (40 mg total) by mouth daily. 03/12/12 12/10/14 Yes Frederich Cha, MD  ibuprofen (ADVIL,MOTRIN) 800 MG tablet Take 1 tablet (800 mg total) by mouth every 8 (eight) hours as needed for pain. 03/12/12  Yes Frederich Cha, MD  ranitidine (ZANTAC) 150 MG tablet Take 150 mg by mouth 2 (two) times daily.     Yes Historical Provider, MD  traMADol (ULTRAM) 50 MG tablet 1-2 tablets po q 8hrs prn 01/02/12  Yes Frederich Cha, MD  busPIRone (BUSPAR) 15 MG tablet Take 1 tablet (15 mg total) by mouth 2 (two) times daily as needed. 04/20/11 04/19/12  Frederich Cha, MD  Carboxymethylcellul-Glycerin (EQ LUBRICATING EYE DROPS) 0.5-0.9 % SOLN Apply 1 drop to eye 2 (two) times daily as needed. 02/06/12 02/05/14  Frederich Cha, MD  cefUROXime (CEFTIN) 500 MG tablet Take 1 tablet (500 mg total) by mouth 2 (two) times daily. 02/06/12   Frederich Cha, MD  conjugated estrogens (PREMARIN) vaginal cream Place vaginally daily. 02/06/12   Frederich Cha, MD  EPINEPHrine (EPIPEN JR) 0.15 MG/0.3ML injection Inject 0.3 mLs (0.15 mg total) into the muscle as needed. 01/12/11   Frederich Cha, MD  esomeprazole (NEXIUM) 40 MG capsule Take 1 capsule (40 mg total) by mouth daily. 12/10/14   Frederich Cha, MD  fentaNYL (DURAGESIC) 25 MCG/HR patch Place 1 patch (25 mcg total) onto the skin every 3 (three) days. 12/10/14   Frederich Cha, MD  fentaNYL (DURAGESIC) 25 MCG/HR patch Place 1 patch (25 mcg total) onto the skin every 3 (three) days. 01/09/15   Frederich Cha, MD  fentaNYL (DURAGESIC) 25 MCG/HR patch Place 1 patch (25 mcg total) onto the skin every 3 (three) days. 02/09/15   Frederich Cha, MD  fentaNYL (DURAGESIC) 25 MCG/HR patch Place 1 patch (25 mcg total) onto the skin every 3 (three) days. 03/10/16   Frederich Cha, MD  fentaNYL (DURAGESIC) 25 MCG/HR patch Place 1 patch (25  mcg total) onto the skin every 3 (three) days. 04/11/15   Frederich Cha, MD  fentaNYL (DURAGESIC) 25 MCG/HR patch Place 1 patch (25 mcg total) onto the skin every 3 (three) days. 05/12/15   Frederich Cha, MD  fentaNYL (DURAGESIC) 25 MCG/HR Place 1 patch (25 mcg total) onto the skin every 3 (three) days. Patient given extra 5 per 3 months for events 05/13/12 12/10/14  Frederich Cha, MD  hydrochlorothiazide (HYDRODIURIL) 25 MG tablet Take 1 tablet (25 mg total) by mouth daily. 08/29/11 08/28/12  Frederich Cha, MD  ibuprofen (ADVIL,MOTRIN) 800 MG tablet Take 1 tablet (800 mg total) by mouth 3 (three) times daily. 12/10/14   Frederich Cha, MD  nicotine (NICOTROL) 10  MG inhaler Inhale 1 puff into the lungs as needed for smoking cessation. 06/20/11 07/20/11  Frederich Cha, MD  prednisoLONE acetate (PRED FORTE) 1 % ophthalmic suspension 1 drop 4 (four) times daily.      Historical Provider, MD  ranitidine (ZANTAC) 150 MG tablet Take 1 tablet (150 mg total) by mouth 2 (two) times daily. 12/10/14   Frederich Cha, MD  tiZANidine (ZANAFLEX) 4 MG tablet Take 1 tablet (4 mg total) by mouth 4 (four) times daily as needed. 03/12/12 03/12/13  Frederich Cha, MD  tiZANidine (ZANAFLEX) 4 MG tablet Take 2 tablets (8 mg total) by mouth every 8 (eight) hours as needed for muscle spasms. 12/10/14   Frederich Cha, MD  traMADol (ULTRAM) 50 MG tablet Take 1 tablet (50 mg total) by mouth every 6 (six) hours as needed for pain. 03/12/12 03/12/13  Frederich Cha, MD  traMADol (ULTRAM) 50 MG tablet Take 1 tablet (50 mg total) by mouth every 6 (six) hours as needed for moderate pain. 12/10/14   Frederich Cha, MD  traMADol (ULTRAM) 50 MG tablet Take 1 tablet (50 mg total) by mouth every 6 (six) hours as needed. 01/09/15   Frederich Cha, MD  traMADol (ULTRAM) 50 MG tablet Take 1 tablet (50 mg total) by mouth every 6 (six) hours as needed for moderate pain. 02/09/15   Frederich Cha, MD  traMADol (ULTRAM) 50 MG tablet Take 1 tablet (50 mg total) by mouth every 6 (six) hours as needed.  03/11/15   Frederich Cha, MD  traMADol (ULTRAM) 50 MG tablet Take 1 tablet (50 mg total) by mouth every 6 (six) hours as needed. 04/11/15   Frederich Cha, MD  traMADol (ULTRAM) 50 MG tablet Take 1 tablet (50 mg total) by mouth every 6 (six) hours as needed. 05/12/15   Frederich Cha, MD  traZODone (DESYREL) 150 MG tablet Take 2 tablets (300 mg total) by mouth at bedtime. Start off w/ a third of a tablet and increase by 1/3 every 2 weeks as needed 03/12/12 05/11/12  Frederich Cha, MD   Meds Ordered and Administered this Visit  Medications - No data to display  Pulse 74  Temp(Src) 98.6 F (37 C) (Tympanic)  Resp 16  Ht 5\' 4"  (1.626 m)  Wt 165 lb (74.844 kg)  BMI 28.31 kg/m2  SpO2 98% No data found.   Physical Exam  Constitutional: She is oriented to person, place, and time. She appears well-developed and well-nourished.  HENT:  Head: Normocephalic and atraumatic.  Neck: Neck supple.  Cardiovascular: Normal rate and regular rhythm.  Exam reveals no friction rub.   No murmur heard. Pulmonary/Chest: Effort normal and breath sounds normal. No respiratory distress. She has no wheezes.  Abdominal: Soft.  Musculoskeletal: Normal range of motion. She exhibits no edema.  Neurological: She is alert and oriented to person, place, and time. She has normal reflexes.  Skin: Skin is dry.  Psychiatric: She has a normal mood and affect.  Vitals reviewed.   ED Course  Procedures (including critical care time)  Labs Review Labs Reviewed - No data to display  Imaging Review No results found.   Visual Acuity Review  Right Eye Distance:   Left Eye Distance:   Bilateral Distance:    Right Eye Near:   Left Eye Near:    Bilateral Near:         MDM   1. Fibromyalgia   2. Chronic back pain   3. Depression      We will renew  her medications which will include her foot no patch 25 g for 6 months tramadol 6 months 50 mg every 6 hours. Those prescriptions will have to be pronounced separately and  then we will renew her Motrin Nexium Zantac and Zanaflex for a year.  Once again suggest she consider [place] Medicare plans for more conclusive and will pay for health maintenance , yearly physicals and establishing with a regular PCP.  Discussed with Dr. Jamal Collin  Visit about doing a colonoscopy on her  Follow-up in 6 months.                 Frederich Cha, MD 12/10/14 1021  Frederich Cha, MD 12/10/14 1025

## 2014-12-29 DIAGNOSIS — F331 Major depressive disorder, recurrent, moderate: Secondary | ICD-10-CM | POA: Diagnosis not present

## 2015-01-14 DIAGNOSIS — F331 Major depressive disorder, recurrent, moderate: Secondary | ICD-10-CM | POA: Diagnosis not present

## 2015-01-26 DIAGNOSIS — F331 Major depressive disorder, recurrent, moderate: Secondary | ICD-10-CM | POA: Diagnosis not present

## 2015-03-08 DIAGNOSIS — Z23 Encounter for immunization: Secondary | ICD-10-CM | POA: Diagnosis not present

## 2015-03-09 DIAGNOSIS — F331 Major depressive disorder, recurrent, moderate: Secondary | ICD-10-CM | POA: Diagnosis not present

## 2015-03-23 DIAGNOSIS — F331 Major depressive disorder, recurrent, moderate: Secondary | ICD-10-CM | POA: Diagnosis not present

## 2015-04-13 DIAGNOSIS — F331 Major depressive disorder, recurrent, moderate: Secondary | ICD-10-CM | POA: Diagnosis not present

## 2015-05-11 DIAGNOSIS — F331 Major depressive disorder, recurrent, moderate: Secondary | ICD-10-CM | POA: Diagnosis not present

## 2015-06-01 DIAGNOSIS — F331 Major depressive disorder, recurrent, moderate: Secondary | ICD-10-CM | POA: Diagnosis not present

## 2015-06-10 ENCOUNTER — Ambulatory Visit
Admission: EM | Admit: 2015-06-10 | Discharge: 2015-06-10 | Disposition: A | Discharge status: Home / Self Care | Payer: Medicare Other | Attending: Family Medicine | Admitting: Family Medicine

## 2015-06-10 ENCOUNTER — Encounter: Payer: Self-pay | Admitting: Emergency Medicine

## 2015-06-10 DIAGNOSIS — M797 Fibromyalgia: Secondary | ICD-10-CM | POA: Diagnosis not present

## 2015-06-10 DIAGNOSIS — K0510 Chronic gingivitis, plaque induced: Secondary | ICD-10-CM | POA: Diagnosis not present

## 2015-06-10 DIAGNOSIS — R5382 Chronic fatigue, unspecified: Secondary | ICD-10-CM

## 2015-06-10 DIAGNOSIS — M62830 Muscle spasm of back: Secondary | ICD-10-CM | POA: Diagnosis not present

## 2015-06-10 DIAGNOSIS — M549 Dorsalgia, unspecified: Secondary | ICD-10-CM

## 2015-06-10 DIAGNOSIS — K029 Dental caries, unspecified: Secondary | ICD-10-CM

## 2015-06-10 DIAGNOSIS — G8929 Other chronic pain: Secondary | ICD-10-CM

## 2015-06-10 MED ORDER — TRAMADOL HCL 50 MG PO TABS: 50.0000 mg | ORAL_TABLET | Freq: Four times a day (QID) | ORAL | Status: DC | PRN

## 2015-06-10 MED ORDER — EPINEPHRINE 0.3 MG/0.3ML IJ SOAJ: 0.3000 mg | Freq: Once | INTRAMUSCULAR | Status: DC

## 2015-06-10 MED ORDER — FENTANYL 25 MCG/HR TD PT72: 25.0000 ug | MEDICATED_PATCH | TRANSDERMAL | Status: DC

## 2015-06-10 NOTE — ED Notes (Signed)
Pt reports lower back pain and left sciatic nerve pain described as chronic reports needs refill on pain medications including Tramadol and Fentanyl patches.

## 2015-06-10 NOTE — ED Provider Notes (Addendum)
CSN: [id number]     Arrival date & time 06/10/15  0907 History   First MD Initiated Contact with Patient 06/10/15 [phone]    Nurses notes were reviewed. Chief Complaint  Patient presents with  . Medication Refill   Ms. Lingen is here for follow-up. She has chronic back pain fibromyalgia hypertension and depression. She's been getting taking fentanyl and tramadol for pain. She's out this is a 6 months follow-up and evaluation for pain management. She states she still having some mild sciatic pain and stiffness and need refills on her pain medication. She does not see the neurosurgeon anytime soon but she has noticed some increased weakness of the left leg and aggravation of her sciatic nerve on the left side as well.  Patient also has some other issues and concerns She still seeing Dr. 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 She has horrible gingivitis horrible dental caries he still carries gotten worse over the years and because of her persistent desire to smoke does not help anything. She's had trouble affording dental care will see if I can get one of the indigent dental care list for her but also suggested that she pursue name down on the [facility] dental program she is informing the takes urine have to be seen there for extractions was explained to her this don't problem going on for 3 years but this time she could've been into the system and having some work done if she per name down. Last but not least of her problems health maintenance Dr. Stephens Shire car normally does her breast exam and schedule for mammogram but she states that his office has not called ask her to please call his office see whether they're going to schedule for mammogram not the next visit may need to do a breast exam on her. And then scheduled for  mammogram. She has had a hysterectomy    (Consider location/radiation/quality/duration/timing/severity/associated sxs/prior Treatment) Patient is a 60 y.o. female presenting with back pain. The history is provided by the patient. No language interpreter was used.  Back Pain Location:  Lumbar spine Quality:  Aching and stabbing Radiates to:  L posterior upper leg Pain severity:  Moderate Pain is:  Same all the time Onset quality:  Unable to specify Timing:  Constant Progression:  Waxing and waning Chronicity:  Chronic Relieved by:  NSAIDs and narcotics Worsened by:  Movement Ineffective treatments:  Muscle relaxants Risk factors: lack of exercise     Past Medical History  Diagnosis Date  . Chronic fatigue   . Depression   . GERD (gastroesophageal reflux disease)   . Asthma   . Allergy   . Neuromuscular disorder ([place])   . Fibromyalgia muscle pain   . Depression screen   . Fibromyalgia   . Hypertension   . Diffuse cystic mastopathy   . Arthritis    Past Surgical History  Procedure Laterality Date  . Back surgery    . Abdominal hysterectomy    . Cholecystectomy    . Nasal sinus surgery    . Cataract    . Breast biopsy Right   . Spine surgery  2003, 2012  . Tympanostomy tube placement    . Nasal sinus surgery    . Eye surgery Bilateral 2011    cataract  .  Colonoscopy  2006   Family History  Problem Relation Age of Onset  . Heart disease Mother   . Heart disease Father   . Vascular Disease Sister   . Heart disease Brother    Social History  Substance Use Topics  . Smoking status: Current Every Day Smoker -- 0.50 packs/day for 30 years    Types: Cigarettes  . Smokeless tobacco: Never Used  . Alcohol Use: No   OB History    Gravida Para Term Preterm AB TAB SAB Ectopic Multiple Living   0 0 0 0 0 0 0 0 0 0       Obstetric Comments   Menstrual age: 64  Age 1st Pregnancy:     Review of Systems  Musculoskeletal: Positive for myalgias and back pain.   All other systems reviewed and are negative.   Allergies  Morphine and related; Penicillins; Shrimp; Tape; Tetanus toxoids; and Tylenol  Home Medications   Prior to Admission medications   Medication Sig Start Date End Date Taking? Authorizing Provider  busPIRone (BUSPAR) 15 MG tablet Take 1 tablet (15 mg total) by mouth 2 (two) times daily as needed. 04/20/11 04/19/12  Frederich Cha, MD  Carboxymethylcellul-Glycerin (EQ LUBRICATING EYE DROPS) 0.5-0.9 % SOLN Apply 1 drop to eye 2 (two) times daily as needed. 02/06/12 02/05/14  Frederich Cha, MD  cefUROXime (CEFTIN) 500 MG tablet Take 1 tablet (500 mg total) by mouth 2 (two) times daily. 02/06/12   Frederich Cha, MD  conjugated estrogens (PREMARIN) vaginal cream Place vaginally daily. 02/06/12   Frederich Cha, MD  EPINEPHrine (EPIPEN 2-PAK) 0.3 mg/0.3 mL IJ SOAJ injection Inject 0.3 mLs (0.3 mg total) into the muscle once. 06/10/15   Frederich Cha, MD  EPINEPHrine (EPIPEN JR) 0.15 MG/0.3ML injection Inject 0.3 mLs (0.15 mg total) into the muscle as needed. 01/12/11   Frederich Cha, MD  esomeprazole (NEXIUM) 40 MG capsule Take 1 capsule (40 mg total) by mouth daily. 03/12/12 12/10/14  Frederich Cha, MD  esomeprazole (NEXIUM) 40 MG capsule Take 1 capsule (40 mg total) by mouth daily. 12/10/14   Frederich Cha, MD  fentaNYL (DURAGESIC) 25 MCG/HR patch Place 1 patch (25 mcg total) onto the skin every 3 (three) days. Prescription should be filled between March 2 and 07/11/2015 06/10/15 07/11/15  Frederich Cha, MD  fentaNYL (DURAGESIC) 25 MCG/HR patch Place 1 patch (25 mcg total) onto the skin every 3 (three) days. To be filled between April 2 and 08/10/2015 07/11/15 08/10/15  Frederich Cha, MD  fentaNYL (DURAGESIC) 25 MCG/HR patch Place 1 patch (25 mcg total) onto the skin every 3 (three) days. To be filled between May 2 and 09/10/2015 08/10/15 09/10/15  Frederich Cha, MD  fentaNYL (DURAGESIC) 25 MCG/HR patch Place 1 patch (25 mcg total) onto the skin every 3 (three) days. To be filled between   09/10/2015 and 10/10/2015 09/10/15 10/10/15  Frederich Cha, MD  fentaNYL (DURAGESIC) 25 MCG/HR patch Place 1 patch (25 mcg total) onto the skin every 3 (three) days. To be filled between July 2 and 11/10/2015 10/10/15 11/10/15  Frederich Cha, MD  fentaNYL (DURAGESIC) 25 MCG/HR patch Place 1 patch (25 mcg total) onto the skin every 3 (three) days. This should be filled between 11/10/2015 and 12/11/2015 11/10/15 12/11/15  Frederich Cha, MD  hydrochlorothiazide (HYDRODIURIL) 25 MG tablet Take 1 tablet (25 mg total) by mouth daily. 08/29/11 08/28/12  Frederich Cha, MD  ibuprofen (ADVIL,MOTRIN) 800 MG tablet Take 1 tablet (800 mg total) by mouth every 8 (eight) hours  as needed for pain. 03/12/12   Frederich Cha, MD  ibuprofen (ADVIL,MOTRIN) 800 MG tablet Take 1 tablet (800 mg total) by mouth 3 (three) times daily. 12/10/14   Frederich Cha, MD  nicotine (NICOTROL) 10 MG inhaler Inhale 1 puff into the lungs as needed for smoking cessation. 06/20/11 07/20/11  Frederich Cha, MD  prednisoLONE acetate (PRED FORTE) 1 % ophthalmic suspension 1 drop 4 (four) times daily.      Historical Provider, MD  ranitidine (ZANTAC) 150 MG tablet Take 150 mg by mouth 2 (two) times daily.      Historical Provider, MD  ranitidine (ZANTAC) 150 MG tablet Take 1 tablet (150 mg total) by mouth 2 (two) times daily. 12/10/14   Frederich Cha, MD  tiZANidine (ZANAFLEX) 4 MG tablet Take 1 tablet (4 mg total) by mouth 4 (four) times daily as needed. 03/12/12 03/12/13  Frederich Cha, MD  tiZANidine (ZANAFLEX) 4 MG tablet Take 2 tablets (8 mg total) by mouth every 8 (eight) hours as needed for muscle spasms. 12/10/14   Frederich Cha, MD  traMADol Veatrice Bourbon) 50 MG tablet Take 1 tablet (50 mg total) by mouth every 6 (six) hours as needed (This prescription to be filled between March 2 and 07/11/2015). 06/10/15 07/11/15  Frederich Cha, MD  traMADol (ULTRAM) 50 MG tablet Take 1 tablet (50 mg total) by mouth every 6 (six) hours as needed (This prescription be filled between April 2 and May 2).  07/11/15 08/10/15  Frederich Cha, MD  traMADol Veatrice Bourbon) 50 MG tablet Take 1 tablet (50 mg total) by mouth every 6 (six) hours as needed for moderate pain or severe pain (This prescription should be filled between May 2 and June 2). 08/10/15 09/10/15  Frederich Cha, MD  traMADol (ULTRAM) 50 MG tablet Take 1 tablet (50 mg total) by mouth every 6 (six) hours as needed for moderate pain or severe pain (Bill this prescription should be filled between June 2 and July 2). 09/10/15 10/10/15  Frederich Cha, MD  traMADol Veatrice Bourbon) 50 MG tablet Take 1 tablet (50 mg total) by mouth every 6 (six) hours as needed for moderate pain or severe pain (This prescription should be filled between July 2 and August 2). 10/10/15 11/10/15  Frederich Cha, MD  traMADol (ULTRAM) 50 MG tablet Take 1 tablet (50 mg total) by mouth every 6 (six) hours as needed for moderate pain or severe pain (This prescription should be filled between August 2 and September 2). 06/10/15   Frederich Cha, MD  traZODone (DESYREL) 150 MG tablet Take 2 tablets (300 mg total) by mouth at bedtime. Start off w/ a third of a tablet and increase by 1/3 every 2 weeks as needed 03/12/12 05/11/12  Frederich Cha, MD   Meds Ordered and Administered this Visit  Medications - No data to display  BP 144/83 mmHg  Pulse 82  Temp(Src) 97 F (36.1 C) (Tympanic)  Resp 18  SpO2 98% No data found.   Physical Exam  Constitutional: She is oriented to person, place, and time. She appears well-developed and well-nourished.  HENT:  Head: Normocephalic and atraumatic.  Mouth/Throat: Oral lesions present. Abnormal dentition. Dental caries present.  Eyes: Conjunctivae are normal. Pupils are equal, round, and reactive to light.  Neck: Normal range of motion. Neck supple.  Cardiovascular: Normal rate.   Pulmonary/Chest: Effort normal and breath sounds normal.  Musculoskeletal: Normal range of motion.  Neurological: She is alert and oriented to person, place, and time.  Skin: Skin is warm and dry.  No  abrasion and no rash noted.  Psychiatric: She exhibits a depressed mood.    ED Course  Procedures (including critical care time)  Labs Review Labs Reviewed - No data to display  Imaging Review No results found.   Visual Acuity Review  Right Eye Distance:   Left Eye Distance:   Bilateral Distance:    Right Eye Near:   Left Eye Near:    Bilateral Near:         MDM   1. Fibromyalgia   2. Spasm of back muscles   3. Chronic back pain   4. Gingivitis, chronic   5. Dental caries   6. Chronic fatigue fibromyalgia syndrome   7. Chronic back pain greater than 3 months duration    The patient return in 6 months for follow-up as mentioned above she may need have breast exam if Car does not wear her mammogram is enormous doing for the last few years. Chronic pain refill of the Duragesic in the fat no patches were given and tramadol. Strongly encourage her to go to [place] [facility] for dental work will give instructions or information as far as of low cost dental plans here or providers to do care for low cost.       Frederich Cha, MD 06/10/15 1215  Frederich Cha, MD 06/10/15 1258

## 2015-06-10 NOTE — Discharge Instructions (Signed)
Back Pain, Adult Back pain is very common. The pain often gets better over time. The cause of back pain is usually not dangerous. Most people can learn to manage their back pain on their own.  HOME CARE  Watch your back pain for any changes. The following actions may help to lessen any pain you are feeling:  Stay active. Start with short walks on flat ground if you can. Try to walk farther each day.  Exercise regularly as told by your doctor. Exercise helps your back heal faster. It also helps avoid future injury by keeping your muscles strong and flexible.  Do not sit, drive, or stand in one place for more than 30 minutes.  Do not stay in bed. Resting more than 1-2 days can slow down your recovery.  Be careful when you bend or lift an object. Use good form when lifting:  Bend at your knees.  Keep the object close to your body.  Do not twist.  Sleep on a firm mattress. Lie on your side, and bend your knees. If you lie on your back, put a pillow under your knees.  Take medicines only as told by your doctor.  Put ice on the injured area.  Put ice in a plastic bag.  Place a towel between your skin and the bag.  Leave the ice on for 20 minutes, 2-3 times a day for the first 2-3 days. After that, you can switch between ice and heat packs.  Avoid feeling anxious or stressed. Find good ways to deal with stress, such as exercise.  Maintain a healthy weight. Extra weight puts stress on your back. GET HELP IF:   You have pain that does not go away with rest or medicine.  You have worsening pain that goes down into your legs or buttocks.  You have pain that does not get better in one week.  You have pain at night.  You lose weight.  You have a fever or chills. GET HELP RIGHT AWAY IF:   You cannot control when you poop (bowel movement) or pee (urinate).  Your arms or legs feel weak.  Your arms or legs lose feeling (numbness).  You feel sick to your stomach (nauseous) or  throw up (vomit).  You have belly (abdominal) pain.  You feel like you may pass out (faint).   This information is not intended to replace advice given to you by your health care provider. Make sure you discuss any questions you have with your health care provider.   Document Released: 09/13/2007 Document Revised: 04/17/2014 Document Reviewed: 07/29/2013 Elsevier Interactive Patient Education 2016 [place] [facility]  [place] Department of Health and [place] [email or web address] [phone])  [facility] [phone])  [place] [phone])  [place] [phone])  [facility] [phone]) This clinic caters to the indigent population and is on a lottery system. Location: [employer] [facility], [place], [place], [place] Clinic Hours: Wednesdays from 6pm - 9pm, patients seen by a lottery system. For dates, call or go to [email or web address] Services: Cleanings, fillings and simple extractions. Payment Options: DENTAL WORK IS FREE OF CHARGE. Bring proof of income or support. Best way to get seen: Arrive at 5:15 pm - this is a lottery, NOT first come/first serve, so arriving earlier will not increase your chances of being seen.  [facility] [phone] Select option 1 for emergencies   Location: [facility], [place], [address], [place] Clinic Hours: No walk-ins accepted - call the day before to schedule an appointment. Check in times are 9:30 am and 1:30 pm. Services: Simple extractions, temporary fillings, pulpectomy/pulp debridement, uncomplicated abscess drainage. Payment Options: PAYMENT IS DUE AT THE TIME OF SERVICE.  Fee is usually $100-200, additional  surgical procedures (e.g. abscess drainage) may be extra. Cash, checks, Visa/MasterCard accepted.  Can file Medicaid if patient is covered for dental - patient should call case worker to check. No discount for [facility] patients. Best way to get seen: MUST call the day before and get onto the schedule. Can usually be seen the next 1-2 days. No walk-ins accepted.     [place] [phone]   Location: [place], [place] Clinic Hours: M, W, Th, F 8am or 1:30pm, Tues 9a or 1:30 - first come/first served. Services: Simple extractions, temporary fillings, uncomplicated abscess drainage.  You do not need to be an [facility] resident. Payment Options: PAYMENT IS DUE AT THE TIME OF SERVICE. Dental insurance, otherwise sliding scale - bring proof of income or support. Depending on income and treatment needed, cost is usually $50-200. Best way to get seen: Arrive early as it is first come/first served.     [facility] [phone]   Location: [place] [facility] Hours: Mon-Thu 8a-5p Services: Most basic dental services including extractions and fillings. Payment Options: PAYMENT IS DUE AT THE TIME OF SERVICE. Sliding scale, up to 50% off - bring proof if income or support. Medicaid with dental option accepted. Best way to get seen: Call to schedule an appointment, can usually be seen within 2 weeks OR they will try to see walk-ins - show up at [place] or 2p (you may have to wait).     [facility] RESIDENTS ONLY   Location: [facility] [place] [address] [place], [place] [address] Clinic Hours: By appointment only. Monday - Thursday 8am-5pm, Friday 8am-12pm Services: Cleanings, fillings, extractions. Payment Options: PAYMENT IS DUE AT THE TIME OF SERVICE. Cash, Visa or MasterCard. Sliding scale - $30 minimum per  service. Best way to get seen: Come in to office, complete packet and make an appointment - need proof of income or support monies for each household member and proof of [facility] residence. Usually takes about a month to get in.     [facility] [phone]   Location: [address], [place] [facility] Hours: Walk-in Urgent Care Dental Services are offered Monday-Friday mornings only. The numbers of emergencies accepted daily is limited to the number of providers available. Maximum 15 - Mondays, Wednesdays & Thursdays Maximum 10 - Tuesdays & Fridays Services: You do not need to be a [facility] resident to be seen for a dental emergency. Emergencies are defined as pain, swelling, abnormal bleeding, or dental trauma. Walkins will receive x-rays if needed. NOTE: Dental cleaning is not an emergency. Payment Options: PAYMENT IS DUE AT THE TIME OF SERVICE. Minimum co-pay is $40.00 for uninsured patients. Minimum co-pay is $3.00 for Medicaid with dental coverage. Dental Insurance is accepted and must be presented at time of visit. Medicare does not cover dental. Forms of payment: Cash, credit card, checks. Best way to get seen: If not previously registered with the clinic, walk-in dental registration  begins at 7:15 am and is on a first come/first serve basis. If previously registered with the clinic, call to make an appointment.     [facility] [place] ONLY   Location: [address] [place], [place] Clinic Hours: Mon-Thu 10a-2p Services: Extractions only! Payment Options: FREE (donations accepted) - bring proof of income or support Best way to get seen: Call and schedule an appointment OR come at 8am on the 1st Monday of every month (except for holidays) when it is first come/first served.     [facility] [phone]   Location: [place], [place] Clinic Hours: Friday  mornings Services, Payment Options, Best way to get seen: Call for info

## 2015-07-13 DIAGNOSIS — F331 Major depressive disorder, recurrent, moderate: Secondary | ICD-10-CM | POA: Diagnosis not present

## 2015-08-03 DIAGNOSIS — F331 Major depressive disorder, recurrent, moderate: Secondary | ICD-10-CM | POA: Diagnosis not present

## 2015-08-24 DIAGNOSIS — F331 Major depressive disorder, recurrent, moderate: Secondary | ICD-10-CM | POA: Diagnosis not present

## 2015-09-28 DIAGNOSIS — F331 Major depressive disorder, recurrent, moderate: Secondary | ICD-10-CM | POA: Diagnosis not present

## 2015-10-26 DIAGNOSIS — F331 Major depressive disorder, recurrent, moderate: Secondary | ICD-10-CM | POA: Diagnosis not present

## 2015-11-16 DIAGNOSIS — F331 Major depressive disorder, recurrent, moderate: Secondary | ICD-10-CM | POA: Diagnosis not present

## 2015-11-25 ENCOUNTER — Ambulatory Visit
Admission: EM | Admit: 2015-11-25 | Discharge: 2015-11-25 | Disposition: A | Discharge status: Home / Self Care | Payer: Medicare Other | Attending: Family Medicine | Admitting: Family Medicine

## 2015-11-25 DIAGNOSIS — M797 Fibromyalgia: Secondary | ICD-10-CM

## 2015-11-25 DIAGNOSIS — Z760 Encounter for issue of repeat prescription: Secondary | ICD-10-CM

## 2015-11-25 DIAGNOSIS — Z79899 Other long term (current) drug therapy: Secondary | ICD-10-CM | POA: Diagnosis not present

## 2015-11-25 DIAGNOSIS — G8929 Other chronic pain: Secondary | ICD-10-CM

## 2015-11-25 DIAGNOSIS — F329 Major depressive disorder, single episode, unspecified: Secondary | ICD-10-CM

## 2015-11-25 DIAGNOSIS — F32A Depression, unspecified: Secondary | ICD-10-CM

## 2015-11-25 MED ORDER — TRAMADOL HCL 50 MG PO TABS: ORAL_TABLET | ORAL | 0 refills | Status: DC

## 2015-11-25 MED ORDER — RANITIDINE HCL 150 MG PO TABS: 150.0000 mg | ORAL_TABLET | Freq: Two times a day (BID) | ORAL | 12 refills | Status: DC

## 2015-11-25 MED ORDER — FENTANYL 25 MCG/HR TD PT72: MEDICATED_PATCH | TRANSDERMAL | 0 refills | Status: DC

## 2015-11-25 MED ORDER — ESOMEPRAZOLE MAGNESIUM 40 MG PO CPDR: 40.0000 mg | DELAYED_RELEASE_CAPSULE | Freq: Every day | ORAL | 12 refills | Status: DC

## 2015-11-25 MED ORDER — THEOPHYLLINE ER 300 MG PO CP24: 300.0000 mg | ORAL_CAPSULE | Freq: Every day | ORAL | 12 refills | Status: DC

## 2015-11-25 MED ORDER — IBUPROFEN 800 MG PO TABS: 800.0000 mg | ORAL_TABLET | Freq: Three times a day (TID) | ORAL | 12 refills | Status: DC

## 2015-11-25 MED ORDER — TIZANIDINE HCL 4 MG PO TABS: 8.0000 mg | ORAL_TABLET | Freq: Three times a day (TID) | ORAL | 12 refills | Status: DC | PRN

## 2015-11-25 MED ORDER — ALBUTEROL SULFATE HFA 108 (90 BASE) MCG/ACT IN AERS: 2.0000 | INHALATION_SPRAY | RESPIRATORY_TRACT | 12 refills | Status: DC | PRN

## 2015-11-25 NOTE — ED Triage Notes (Signed)
Patient states that she is here for her chronic fatigue and fibromyalgia. Patient states that she needs refills of her medication.

## 2015-11-25 NOTE — ED Provider Notes (Signed)
[facility]    CSN: [id number] Arrival date & time: 11/25/15  0904  First Provider Contact:  None       History   Chief Complaint Chief Complaint  Patient presents with  . Medication Refill    HPI April Price is a 60 y.o. female.   Patient is here for refill of her medication. She is on chronic pain medication that she takes a regular basis to treat chronic fatigue fibromyalgia muscle pain fibromyalgia and also for her history of chronic back pain. She's not seen the neurosurgeon in a while but he is very warn her that she has another dysphagia may have to fix if he gets worse COME back to see him. She's currently on Zanaflex 4 mg 2 tablets every 8 hours ranitidine 150 mg 1 tablet twice a day tramadol 50 mg every 6 hours ibuprofen 803 times a day for no apparent 25 g every 72 hours and Nexium 40 mg. She's found these medications for a long time and appears be a under control.   She's had history of depression and she reports some stress as one of her main caregivers her psychologist Dr. Eugenia Mcalpine plan to retire next June. She still seeing Dr. Jamal Collin  for her mammograms for breast exams. I did explain to her that with the new loss coming up in January limiting urgent care physicians on the prescriptions we need to really start thinking about pain clinic and she is going to talk to Dr. Norwood Levo about suggestions.   She still smokes and she did have an episode of bronchitis with exacerbation due to ingestion of some water while she was driving. She states that she didn't take any antibiotics finally the bronchitis did clear but she wants to have a prescription of Hubbard Robinson or Theo-24 in case she has another exacerbation.   The history is provided by the patient. No language interpreter was used.  Medication Refill  Reason for request:  Medications ran out Medications taken before: yes - see home medications   Patient has complete original prescription information:  yes     Past Medical History:  Diagnosis Date  . Allergy   . Arthritis   . Asthma   . Chronic fatigue   . Depression   . Depression screen   . Diffuse cystic mastopathy   . Fibromyalgia   . Fibromyalgia muscle pain   . GERD (gastroesophageal reflux disease)   . Hypertension   . Neuromuscular disorder [facility])     Patient Active Problem List   Diagnosis Date Noted  . Spasm of back muscles 01/31/2011    Priority: High  . Fibromyalgia 01/12/2011    Priority: High  . Chronic back pain 01/12/2011    Priority: High  . History of fibrocystic disease of breast 01/08/2013  . Dry eyes, bilateral 02/06/2012  . Hypoglycemia 02/06/2012  . Hypertension 02/06/2012  . Chronic fatigue syndrome 02/06/2012  . Osteopenia 02/06/2012  . Vaginal atrophy 02/06/2012  . Fibromyalgia syndrome 01/31/2011  . Chronic neck pain 01/31/2011  . Depression 01/13/2011  . History of seasonal allergies 01/13/2011  . Situational disturbance 01/13/2011  . Elevated blood pressure 01/13/2011    Past Surgical History:  Procedure Laterality Date  . ABDOMINAL HYSTERECTOMY    . BACK SURGERY    . BREAST BIOPSY Right   . cataract    . CHOLECYSTECTOMY    . COLONOSCOPY  2006  . EYE SURGERY Bilateral 2011   cataract  . NASAL SINUS SURGERY    .  NASAL SINUS SURGERY    . [place]  2003, 2012  . TYMPANOSTOMY TUBE PLACEMENT      OB History    Gravida Para Term Preterm AB Living   0 0 0 0 0 0   SAB TAB Ectopic Multiple Live Births   0 0 0 0        Obstetric Comments   Menstrual age: 89  Age 1st Pregnancy:       Home Medications    Prior to Admission medications   Medication Sig Start Date End Date Taking? Authorizing Provider  busPIRone (BUSPAR) 15 MG tablet Take 1 tablet (15 mg total) by mouth 2 (two) times daily as needed. 04/20/11 11/25/15 Yes Frederich Cha, MD  Carboxymethylcellul-Glycerin (EQ LUBRICATING EYE DROPS) 0.5-0.9 % SOLN Apply 1 drop to eye 2 (two) times daily as needed. 02/06/12  11/25/15 Yes Frederich Cha, MD  EPINEPHrine (EPIPEN 2-PAK) 0.3 mg/0.3 mL IJ SOAJ injection Inject 0.3 mLs (0.3 mg total) into the muscle once. 06/10/15  Yes Frederich Cha, MD  fentaNYL (DURAGESIC) 25 MCG/HR patch Place 1 patch (25 mcg total) onto the skin every 3 (three) days. This should be filled between 11/10/2015 and 12/11/2015 11/10/15 12/11/15 Yes Frederich Cha, MD  ibuprofen (ADVIL,MOTRIN) 800 MG tablet Take 1 tablet (800 mg total) by mouth every 8 (eight) hours as needed for pain. 03/12/12  Yes Frederich Cha, MD  ranitidine (ZANTAC) 150 MG tablet Take 150 mg by mouth 2 (two) times daily.     Yes Historical Provider, MD  traMADol (ULTRAM) 50 MG tablet Take 1 tablet (50 mg total) by mouth every 6 (six) hours as needed for moderate pain or severe pain (This prescription should be filled between August 2 and September 2). 06/10/15  Yes Frederich Cha, MD  albuterol (PROVENTIL HFA;VENTOLIN HFA) 108 (90 Base) MCG/ACT inhaler Inhale 2 puffs into the lungs every 4 (four) hours as needed for wheezing or shortness of breath. 11/25/15   Frederich Cha, MD  cefUROXime (CEFTIN) 500 MG tablet Take 1 tablet (500 mg total) by mouth 2 (two) times daily. 02/06/12   Frederich Cha, MD  conjugated estrogens (PREMARIN) vaginal cream Place vaginally daily. 02/06/12   Frederich Cha, MD  EPINEPHrine (EPIPEN JR) 0.15 MG/0.3ML injection Inject 0.3 mLs (0.15 mg total) into the muscle as needed. 01/12/11   Frederich Cha, MD  esomeprazole (NEXIUM) 40 MG capsule Take 1 capsule (40 mg total) by mouth daily. 03/12/12 12/10/14  Frederich Cha, MD  esomeprazole (NEXIUM) 40 MG capsule Take 1 capsule (40 mg total) by mouth daily. 11/25/15   Frederich Cha, MD  fentaNYL ([place]) 25 MCG/HR patch To be filled between 09/02 and 01/10/2016 11/25/15   Frederich Cha, MD  fentaNYL ([place]) 25 MCG/HR patch To be filled between 01/10/2016 and 02/10/2016 11/25/15   Frederich Cha, MD  fentaNYL ([place]) 25 MCG/HR patch To be filled between 02/10/2016 and 03/11/2016 11/25/15   Frederich Cha, MD  fentaNYL ([place]) 25 MCG/HR patch To be filled between 03/11/2016 and 04/11/2016 11/25/15   Frederich Cha, MD  fentaNYL ([place]) 25 MCG/HR patch Prescription should be filled between to be filled between 04/11/2016 and 05/12/2016 11/25/15   Frederich Cha, MD  fentaNYL ([place]) 25 MCG/HR patch To be filled between 05/12/2006 seen in 06/09/2016 11/25/15   Frederich Cha, MD  hydrochlorothiazide (HYDRODIURIL) 25 MG tablet Take 1 tablet (25 mg total) by mouth daily. 08/29/11 08/28/12  Frederich Cha, MD  ibuprofen (ADVIL,MOTRIN) 800 MG tablet Take 1 tablet (800 mg total) by mouth  3 (three) times daily. 11/25/15   Frederich Cha, MD  nicotine (NICOTROL) 10 MG inhaler Inhale 1 puff into the lungs as needed for smoking cessation. 06/20/11 07/20/11  Frederich Cha, MD  prednisoLONE acetate (PRED FORTE) 1 % ophthalmic suspension 1 drop 4 (four) times daily.      Historical Provider, MD  ranitidine (ZANTAC) 150 MG tablet Take 1 tablet (150 mg total) by mouth 2 (two) times daily. 11/25/15   Frederich Cha, MD  theophylline (THEO-24) 300 MG 24 hr capsule Take 1 capsule (300 mg total) by mouth daily. 11/25/15   Frederich Cha, MD  tiZANidine (ZANAFLEX) 4 MG tablet Take 1 tablet (4 mg total) by mouth 4 (four) times daily as needed. 03/12/12 03/12/13  Frederich Cha, MD  tiZANidine (ZANAFLEX) 4 MG tablet Take 2 tablets (8 mg total) by mouth every 8 (eight) hours as needed for muscle spasms. 11/25/15   Frederich Cha, MD  traMADol Veatrice [place]) 50 MG tablet To be filled between 12/11/2015 and 01/10/2016 11/25/15   Frederich Cha, MD  traMADol Veatrice [place]) 50 MG tablet This medication may be be filled between 01/10/2016 and 02/10/2016 11/25/15   Frederich Cha, MD  traMADol Veatrice [place]) 50 MG tablet To be filled between 02/10/2016 and 03/11/2016 11/25/15   Frederich Cha, MD  traMADol Veatrice [place]) 50 MG tablet To be filled between 03/11/2016 and 04/11/2016 11/25/15   Frederich Cha, MD  traMADol Veatrice [place]) 50 MG tablet To be filled between 04/11/2016 and 05/12/2016 11/25/15    Frederich Cha, MD  traMADol (ULTRAM) 50 MG tablet Tablet by mouth 4 times a day when necessary orally. To be filled between 05/12/2016 and 06/09/2016 11/25/15   Frederich Cha, MD  traZODone (DESYREL) 150 MG tablet Take 2 tablets (300 mg total) by mouth at bedtime. Start off w/ a third of a tablet and increase by 1/3 every 2 weeks as needed 03/12/12 05/11/12  Frederich Cha, MD    Family History Family History  Problem Relation Age of Onset  . Heart disease Mother   . Heart disease Father   . Vascular Disease Sister   . Heart disease Brother     Social History Social History  Substance Use Topics  . Smoking status: Current Every Day Smoker    Packs/day: 0.50    Years: 30.00    Types: Cigarettes  . Smokeless tobacco: Never Used  . Alcohol use No     Allergies   Morphine and related; Penicillins; Shrimp [shellfish allergy]; Tape; Tetanus toxoids; and Tylenol [acetaminophen]   Review of Systems Review of Systems  Respiratory: Positive for cough, chest tightness, shortness of breath and wheezing.   Musculoskeletal: Positive for arthralgias and myalgias.  All other systems reviewed and are negative.    Physical Exam Triage Vital Signs ED Triage Vitals  Enc Vitals Group     BP 11/25/15 0918 (!) 147/80     Pulse Rate 11/25/15 0918 75     Resp 11/25/15 0918 16     Temp 11/25/15 0918 97.4 F (36.3 C)     Temp Source 11/25/15 0918 Tympanic     SpO2 11/25/15 0918 97 %     Weight 11/25/15 0917 170 lb (77.1 kg)     Height 11/25/15 0917 5\' 4"  (1.626 m)     Head Circumference --      Peak Flow --      Pain Score 11/25/15 0923 4     Pain Loc --      Pain Edu? --  Excl. in GC? --    No data found.   Updated Vital Signs BP (!) 147/80 (BP Location: Left Arm)   Pulse 75   Temp 97.4 F (36.3 C) (Tympanic)   Resp 16   Ht 5\' 4"  (1.626 m)   Wt 170 lb (77.1 kg)   SpO2 97%   BMI 29.18 kg/m   Visual Acuity Right Eye Distance:   Left Eye Distance:   Bilateral Distance:     Right Eye Near:   Left Eye Near:    Bilateral Near:     Physical Exam  Constitutional: She is oriented to person, place, and time. She appears well-developed and well-nourished.  HENT:  Head: Normocephalic and atraumatic.  Eyes: Pupils are equal, round, and reactive to light.  Neck: Normal range of motion.  Pulmonary/Chest: Effort normal and breath sounds normal. No respiratory distress.  Musculoskeletal: Normal range of motion.  Neurological: She is alert and oriented to person, place, and time.  Skin: Skin is warm.  Psychiatric: She has a normal mood and affect.     UC Treatments / Results  Labs (all labs ordered are listed, but only abnormal results are displayed) Labs Reviewed - No data to display  EKG  EKG Interpretation None       Radiology No results found.  Procedures Procedures (including critical care time)  Medications Ordered in UC Medications - No data to display   Initial Impression / Assessment and Plan / UC Course  I have reviewed the triage vital signs and the nursing notes.  Pertinent labs & imaging results that were available during my care of the patient were reviewed by me and considered in my medical decision making (see chart for details).  Clinical Course    We'll give her new prescriptions for her 6 medications and refills or monthly prescriptions for the next 6 prescription. Follow-up in 6 months with needed and to notify me if she is having trouble finding a pain clinic doctor. Final Clinical Impressions(s) / UC Diagnoses   Final diagnoses:  Medication refill  Depression  Chronic pain  Medication management  Fibromyalgia   I beat all and theophylline were 2 new prescriptions but renewed her Zanaflex 2 tablets 3 times a day, tramadol 1 tablet 4 times a day, fentanyl  25 g patch apply every 3 days renew ibuprofen 800 mg 3 times a day and Zantac 150 twice a day and Nexium 40 mg 1 capsule daily.    New Prescriptions Discharge  Medication List as of 11/25/2015 10:45 AM    START taking these medications   Details  albuterol (PROVENTIL HFA;VENTOLIN HFA) 108 (90 Base) MCG/ACT inhaler Inhale 2 puffs into the lungs every 4 (four) hours as needed for wheezing or shortness of breath., Starting Thu 11/25/2015, Print    theophylline (THEO-24) 300 MG 24 hr capsule Take 1 capsule (300 mg total) by mouth daily., Starting Thu 11/25/2015, Print         Frederich Cha, MD 11/25/15 [phone]

## 2015-12-07 DIAGNOSIS — F331 Major depressive disorder, recurrent, moderate: Secondary | ICD-10-CM | POA: Diagnosis not present

## 2015-12-28 DIAGNOSIS — F331 Major depressive disorder, recurrent, moderate: Secondary | ICD-10-CM | POA: Diagnosis not present

## 2016-02-10 ENCOUNTER — Encounter: Payer: Self-pay | Admitting: Pain Medicine

## 2016-02-10 ENCOUNTER — Ambulatory Visit: Payer: Medicare Other | Attending: Pain Medicine | Admitting: Pain Medicine

## 2016-02-10 VITALS — BP 167/75 | HR 80 | Temp 98.2°F | Resp 18 | Ht 64.0 in | Wt 170.0 lb

## 2016-02-10 DIAGNOSIS — M542 Cervicalgia: Secondary | ICD-10-CM | POA: Diagnosis not present

## 2016-02-10 DIAGNOSIS — M797 Fibromyalgia: Secondary | ICD-10-CM | POA: Diagnosis not present

## 2016-02-10 DIAGNOSIS — M25561 Pain in right knee: Secondary | ICD-10-CM

## 2016-02-10 DIAGNOSIS — M961 Postlaminectomy syndrome, not elsewhere classified: Secondary | ICD-10-CM

## 2016-02-10 DIAGNOSIS — M545 Low back pain, unspecified: Secondary | ICD-10-CM | POA: Insufficient documentation

## 2016-02-10 DIAGNOSIS — M79605 Pain in left leg: Secondary | ICD-10-CM | POA: Diagnosis not present

## 2016-02-10 DIAGNOSIS — M25559 Pain in unspecified hip: Secondary | ICD-10-CM | POA: Insufficient documentation

## 2016-02-10 DIAGNOSIS — F1190 Opioid use, unspecified, uncomplicated: Secondary | ICD-10-CM | POA: Insufficient documentation

## 2016-02-10 DIAGNOSIS — Z981 Arthrodesis status: Secondary | ICD-10-CM | POA: Diagnosis not present

## 2016-02-10 DIAGNOSIS — G8929 Other chronic pain: Secondary | ICD-10-CM | POA: Diagnosis not present

## 2016-02-10 DIAGNOSIS — M792 Neuralgia and neuritis, unspecified: Secondary | ICD-10-CM | POA: Diagnosis not present

## 2016-02-10 DIAGNOSIS — M791 Myalgia: Secondary | ICD-10-CM | POA: Insufficient documentation

## 2016-02-10 DIAGNOSIS — M7918 Myalgia, other site: Secondary | ICD-10-CM

## 2016-02-10 DIAGNOSIS — M25529 Pain in unspecified elbow: Secondary | ICD-10-CM | POA: Diagnosis not present

## 2016-02-10 DIAGNOSIS — R5382 Chronic fatigue, unspecified: Secondary | ICD-10-CM | POA: Diagnosis not present

## 2016-02-10 DIAGNOSIS — Z79891 Long term (current) use of opiate analgesic: Secondary | ICD-10-CM | POA: Diagnosis not present

## 2016-02-10 DIAGNOSIS — M79604 Pain in right leg: Secondary | ICD-10-CM | POA: Diagnosis not present

## 2016-02-10 DIAGNOSIS — M25512 Pain in left shoulder: Secondary | ICD-10-CM | POA: Insufficient documentation

## 2016-02-10 DIAGNOSIS — M533 Sacrococcygeal disorders, not elsewhere classified: Secondary | ICD-10-CM | POA: Insufficient documentation

## 2016-02-10 DIAGNOSIS — G894 Chronic pain syndrome: Secondary | ICD-10-CM | POA: Diagnosis not present

## 2016-02-10 DIAGNOSIS — M79643 Pain in unspecified hand: Secondary | ICD-10-CM

## 2016-02-10 DIAGNOSIS — M25549 Pain in joints of unspecified hand: Secondary | ICD-10-CM | POA: Diagnosis not present

## 2016-02-10 DIAGNOSIS — M25562 Pain in left knee: Secondary | ICD-10-CM

## 2016-02-10 DIAGNOSIS — G9332 Myalgic encephalomyelitis/chronic fatigue syndrome: Secondary | ICD-10-CM

## 2016-02-10 DIAGNOSIS — M25511 Pain in right shoulder: Secondary | ICD-10-CM | POA: Insufficient documentation

## 2016-02-10 NOTE — Progress Notes (Signed)
Patient's Name: April Price  MRN: [id number]  Referring Provider: Frederich Cha, MD  DOB: [birth year]  PCP: Jefferey Pica, MD  DOS: 02/10/2016  Note by: Kathlen Brunswick. Dossie Arbour, MD  Service setting: Ambulatory outpatient  Specialty: Interventional Pain Management  Location: [facility] (AMB) Pain Management Facility    Patient type: New Patient   Primary Reason(s) for Visit: Initial Patient Evaluation CC: Pain ("all over due to fibromyalgia") and Back Pain (lower)  HPI  April Price is a 60 y.o. year old, female patient, who comes today for an initial evaluation. She has Fibromyalgia; Depression; History of seasonal allergies; Situational disturbance; Elevated blood pressure; Spasm of back muscles; Chronic neck pain; Dry eyes, bilateral; Hypoglycemia; Hypertension; Chronic fatigue syndrome; Osteopenia; Vaginal atrophy; History of fibrocystic disease of breast; Chronic pain syndrome; Long term current use of opiate analgesic; Long term prescription opiate use; Opiate use (140 MME/Day); Chronic shoulder pain (Location of Primary Source of Pain) (Bilateral) (L>R); Chronic hip pain (Location of Secondary source of pain) (Bilateral) (L>R); Chronic sacroiliac joint pain (Location of Tertiary source of pain) (Bilateral) (L>R); Chronic low back pain (Bilateral) (L>R); Failed back surgical syndrome; History of lumbar fusion; Chronic lower extremity pain (Bilateral) (L>R); Chronic knee pain (Bilateral) (L>R); Chronic elbow pain (Bilateral) (L>R); Chronic hand pain (Bilateral) (L>R); Musculoskeletal pain; and Neurogenic pain on her problem list.. Her primarily concern today is the Pain ("all over due to fibromyalgia") and Back Pain (lower)  Pain Assessment: Self-Reported Pain Score: 3 /10             Reported level is compatible with observation.       Pain Type: Chronic pain Pain Location:  ("all over") Pain Descriptors / Indicators: Aching, Dull Pain Frequency: Constant  Onset and Duration: Gradual and Date  of onset: More than 13 years ago Cause of pain: Fibromyalgia and a ruptured disc Severity: No change since onset, NAS-11 at its worse: 8/10, NAS-11 at its best: 2/10, NAS-11 now: 3/10 and NAS-11 on the average: 4/10 Timing: Afternoon, Night, After activity or exercise and After a period of immobility Aggravating Factors: Bending, Climbing, Kneeling, Lifiting, Prolonged sitting, Prolonged standing, Squatting, Stooping  and Walking Alleviating Factors: Stretching, Hot packs, Lying down, Medications, Resting, Sitting, Standing, TENS, Relaxation therapy, Warm showers or baths and Walking Associated Problems: Night-time cramps, Depression, Fatigue, Inability to concentrate, Numbness, Spasms, Temperature changes, Tingling, Pain that wakes patient up and Pain that does not allow patient to sleep Quality of Pain: Annoying, Exhausting, Tender, Tingling, Tiring and Toothache-like Previous Examinations or Tests: MRI scan, Nerve block, Neurosurgical evaluation, Orthoperdic evaluation and Psychiatric evaluation Previous Treatments: Biofeedback, Epidural steroid injections, Facet blocks, Narcotic medications, Physical Therapy, Pool exercises, Radiofrequency, Relaxation therapy, Stretching exercises, TENS and Trigger point injections  The patient comes into the clinics today for the first time for a chronic pain management evaluation. The patient indicates the primary pain to be that of the shoulders with the left side being worst on the right. She denies any surgeries or nerve blocks in the area of the shoulders. The second worst pain is out of the hips with the left being worst on the right. Again she denies any surgeries or nerve blocks in the hip area. The next area of pain is that of the sacroiliac joint with the left side being worst on the right. She indicates having had bilateral sacroiliac joint surgeries approximately 15-20 years ago to remove some localized lipomas, bilaterally. She denies any nerve blocks in  the area of the sacroiliac  joint. In addition, the patient indicates that her fourth worst area pain is that of the lower back with the left being worst on the right. She indicates having had a prior back surgery done by Dr. Hal Neer approximately 3-4 years ago. Prior to the surgery her primary pain was in the lower back. She apparently had a fusion in the pain completely went away until approximately 1-1/2 years ago when he returned. At the time she was also having some left lower extremity pain which also didn't go away and did not come back until 1.5-2 years ago. The patient's next day her pain is that of the lower extremity which is also bilateral with the left side being worst on the right.  Today I took the time to provide the patient with information regarding my pain practice. The patient was informed that my practice is divided into two sections: an interventional pain management section, as well as a completely separate and distinct medication management section. The interventional portion of my practice takes place on Tuesdays and Thursdays, while the medication management is conducted on Mondays and Wednesdays. Because of the amount of documentation required on both them, they are kept separated. This means that there is the possibility that the patient may be scheduled for a procedure on Tuesday, while also having a medication management appointment on Wednesday. I have also informed the patient that because of current staffing and facility limitations, I no longer take patients for medication management only. To illustrate the reasons for this, I gave the patient the example of a surgeon and how inappropriate it would be to refer a patient to his/her practice so that they write for the post-procedure antibiotics on a surgery done by someone else.   The patient was informed that joining my practice means that they are open to any and all interventional therapies. I clarified for the patient that this  does not mean that they will be forced to have any procedures done. What it means is that patients looking for a practitioner to simply write for their pain medications and not take advantage of other interventional techniques will be better served by a different practitioner, other than myself. I made it clear that I prefer to spend my time providing those services that I specialize in.  The patient was also made aware of my Comprehensive Pain Management Safety Guidelines where by joining my practice, they limit all of their nerve blocks and joint injections to those done by our practice, for as long as we are retained to manage their care.   Historic Controlled Substance Pharmacotherapy Review  PMP and historical list of controlled substances: Tramadol 50 mg by mouth 4 times a day + Duragesic patch 25 g per hour every 72 hours Highest analgesic regimen found: Hydromorphone 2 mg, 2 tablets every 6 hours Most recent analgesic: Tramadol 50 mg by mouth 4 times a day + Duragesic patch 25 g per hour every 72 hours Highest recorded MME/day: 86.67 mg/day MME/day: 80 mg/day Medications: The patient did not bring the medication(s) to the appointment, as requested in our "New Patient Package" Pharmacodynamics: Desired effects: Analgesia: The patient reports >50% benefit. Reported improvement in function: The patient reports medication allows her to accomplish basic ADLs. Clinically meaningful improvement in function (CMIF): Sustained CMIF goals met Perceived effectiveness: Described as relatively effective, allowing for increase in activities of daily living (ADL) Undesirable effects: Side-effects or Adverse reactions: None reported Historical Monitoring: The patient  reports that she does not use  drugs.. No results found for: MDMA, COCAINSCRNUR, PCPSCRNUR, THCU, ETH Historical Background Evaluation: [place] PDMP: Six (6) year initial data search conducted.             [place] Department of public safety,  offender search: Editor, commissioning Information) Non-contributory Risk Assessment Profile: Aberrant behavior: None observed or detected today Risk factors for fatal opioid overdose: None identified today Fatal overdose hazard ratio (HR): Calculation deferred Non-fatal overdose hazard ratio (HR): Calculation deferred Risk of opioid abuse or dependence: 0.7-3.0% with doses ? 36 MME/day and 6.1-26% with doses ? 120 MME/day. Substance use disorder (SUD) risk level: Pending results of Medical Psychology Evaluation for SUD Opioid risk tool (ORT) (Total Score): 1  ORT Scoring interpretation table:  Score <3 = Low Risk for SUD  Score between 4-7 = Moderate Risk for SUD  Score >8 = High Risk for Opioid Abuse   PHQ-2 Depression Scale:  Total score: 0  PHQ-2 Scoring interpretation table: (Score and probability of major depressive disorder)  Score 0 = No depression  Score 1 = 15.4% Probability  Score 2 = 21.1% Probability  Score 3 = 38.4% Probability  Score 4 = 45.5% Probability  Score 5 = 56.4% Probability  Score 6 = 78.6% Probability   PHQ-9 Depression Scale:  Total score: 0  PHQ-9 Scoring interpretation table:  Score 0-4 = No depression  Score 5-9 = Mild depression  Score 10-14 = Moderate depression  Score 15-19 = Moderately severe depression  Score 20-27 = Severe depression (2.4 times higher risk of SUD and 2.89 times higher risk of overuse)   Pharmacologic Plan: Pending ordered tests and/or consults  Meds  The patient has a current medication list which includes the following prescription(s): albuterol, epinephrine, esomeprazole, fentanyl, fentanyl, fentanyl, fentanyl, fentanyl, fentanyl, ibuprofen, ranitidine, theophylline, tizanidine, tramadol, tramadol, tramadol, tramadol, tramadol, tramadol, tramadol, and trazodone.  Current Outpatient Prescriptions on File Prior to Visit  Medication Sig  . albuterol (PROVENTIL HFA;VENTOLIN HFA) 108 (90 Base) MCG/ACT inhaler Inhale 2 puffs into the lungs  every 4 (four) hours as needed for wheezing or shortness of breath.  . EPINEPHrine (EPIPEN 2-PAK) 0.3 mg/0.3 mL IJ SOAJ injection Inject 0.3 mLs (0.3 mg total) into the muscle once.  Marland Kitchen esomeprazole (NEXIUM) 40 MG capsule Take 1 capsule (40 mg total) by mouth daily.  . fentaNYL (DURAGESIC) 25 MCG/HR patch To be filled between 09/02 and 01/10/2016  . fentaNYL (DURAGESIC) 25 MCG/HR patch To be filled between 01/10/2016 and 02/10/2016  . fentaNYL (DURAGESIC) 25 MCG/HR patch To be filled between 02/10/2016 and 03/11/2016  . fentaNYL (DURAGESIC) 25 MCG/HR patch To be filled between 03/11/2016 and 04/11/2016  . fentaNYL (DURAGESIC) 25 MCG/HR patch Prescription should be filled between to be filled between 04/11/2016 and 05/12/2016  . fentaNYL (DURAGESIC) 25 MCG/HR patch To be filled between 05/12/2006 seen in 06/09/2016  . ibuprofen (ADVIL,MOTRIN) 800 MG tablet Take 1 tablet (800 mg total) by mouth every 8 (eight) hours as needed for pain.  . ranitidine (ZANTAC) 150 MG tablet Take 1 tablet (150 mg total) by mouth 2 (two) times daily.  . theophylline (THEO-24) 300 MG 24 hr capsule Take 1 capsule (300 mg total) by mouth daily.  Marland Kitchen tiZANidine (ZANAFLEX) 4 MG tablet Take 2 tablets (8 mg total) by mouth every 8 (eight) hours as needed for muscle spasms.  . traMADol (ULTRAM) 50 MG tablet Take 1 tablet (50 mg total) by mouth every 6 (six) hours as needed for moderate pain or severe pain (This prescription should be  filled between August 2 and September 2).  . traMADol (ULTRAM) 50 MG tablet To be filled between 12/11/2015 and 01/10/2016  . traMADol (ULTRAM) 50 MG tablet This medication may be be filled between 01/10/2016 and 02/10/2016  . traMADol (ULTRAM) 50 MG tablet To be filled between 02/10/2016 and 03/11/2016  . traMADol (ULTRAM) 50 MG tablet To be filled between 03/11/2016 and 04/11/2016  . traMADol (ULTRAM) 50 MG tablet To be filled between 04/11/2016 and 05/12/2016  . traMADol (ULTRAM) 50 MG tablet  Tablet by mouth 4 times a day when necessary orally. To be filled between 05/12/2016 and 06/09/2016  . traZODone (DESYREL) 150 MG tablet Take 2 tablets (300 mg total) by mouth at bedtime. Start off w/ a third of a tablet and increase by 1/3 every 2 weeks as needed   No current facility-administered medications on file prior to visit.    Imaging Review  Lumbosacral Imaging: Lumbar MR wo contrast:  Results for orders placed in visit on 08/05/10  MR L Spine Ltd W/O Cm   Narrative * PRIOR REPORT IMPORTED FROM AN EXTERNAL SYSTEM *   PRIOR REPORT IMPORTED FROM THE [facility] WORKFLOW SYSTEM   REASON FOR EXAM:    pain lt side  COMMENTS:   PROCEDURE:     MR  - MR LUMBAR SPINE WO CONTRAST  - Aug 05 2010  9:46AM   RESULT:   HISTORY:   Pain.   FINDINGS:  Multiplanar, multisequence imaging of the lumbar spine is  obtained. Lumbar vertebrae are numbered with the lowest segmental full  size  lumbar vertebrae as L5. There is a large left paracentral L5-S1 disc  protrusion. There may be a large sequestered fragment. There is flattening  of the thecal sac and displacement of the thecal sac to the right. Thecal  sac measures 5 mm in transverse diameter x 9 mm in AP diameter. Left  neural  foramen at this level is narrowed. No other disc protrusions are noted.  Multilevel disc degeneration and annular bulge is present. Lumbar cord is  normal. No paraspinal abnormality is identified. No acute bony abnormality  is identified.   IMPRESSION:   L5-S1 large left paracentral disc protrusion with probable sequestered  fragment. There is narrowing of the left neural foramen and prominent  spinal  stenosis at this level. Left lateral recess is severely compressed at this  level.   Thank you for the opportunity to contribute to the care of your patient.       Lumbar MR w/wo contrast:  Results for orders placed in visit on 01/07/13  MR Lumbar Spine W Wo Contrast   Narrative * PRIOR REPORT IMPORTED  FROM AN EXTERNAL SYSTEM *   PRIOR REPORT IMPORTED FROM THE [facility] WORKFLOW SYSTEM   REASON FOR EXAM:    back pain   with radiation  COMMENTS:   PROCEDURE:     MR  - MR LUMBAR SPINE WO/W  - Jan 07 2013 10:09AM   RESULT:     MRI LUMBAR SPINE WITHOUT AND WITH CONTRAST   HISTORY: Back pain   COMPARISON: 08/05/2010   TECHNIQUE: Standard MRI sequences of the lumbar spine were obtained both  pre- and post-administration of 16 ml of intravenous Magnevist.   FINDINGS:   The vertebral bodies of the lumbar spine are normal in size and alignment.  There is normal bone marrow signal demonstrated throughout the vertebra.   There is posterior spinal fusion at L5-S1 with bilateral pedicle screws  present at each  level. There is an intervertebral spacer device at L5-S1  in  satisfactory position. There are no areas of abnormal enhancement.   The spinal cord is of normal volume and contour. The cord terminates  normally at T12-L1 . The nerve roots of the cauda equina and the filum  terminale have the usual appearance.   The visualized portions of the SI joints are unremarkable.   The imaged intra-abdominal contents are unremarkable.   T12-L1: Minimal broad-based disc bulge. No evidence of neural foraminal or  central stenosis.   L1-L2: Minimal broad-based disc bulge. No evidence of neural foraminal or  central stenosis.   L2-L3: Mild broad-based disc bulge with a right paracentral annular tear.  Mild right foraminal stenosis. No left foraminal stenosis. No central  canal  stenosis.   L3-L4: Mild broad-based disc bulge. Moderate bilateral facet arthropathy.  No  significant foraminal stenosis. Mild central canal stenosis.   L4-L5: Mild broad-based disc bulge eccentric towards the left with a  foraminal component. There is moderate left foraminal stenosis. There is  no  right foraminal stenosis.   L5-S1: No significant disc bulge. No abnormal enhancement. The left neural  foramen  is suboptimally evaluated secondary to susceptibility artifact  resulting from the orthopedic hardware. Is evidence of prior L5  laminectomy.   IMPRESSION:   1. Posterior spinal fusion at L5-S1 without failure or complication.   2. Lumbar spine spondylosis as described above.   Dictation Site: 1       Lumbar DG 2-3 views:  Results for orders placed during the hospital encounter of 11/21/10  DG Lumbar Spine 2-3 Views   Narrative *RADIOLOGY REPORT*  Clinical Data: History of lumbar spine surgery, follow-up  LUMBAR SPINE - 2-3 VIEW  Comparison: Lumbar spine films of 10/03/2010  Findings: Hardware for posterior fusion at L5-S1 appears stable. The interbody fusion plug is unchanged in position and in height. There is still some lucency around the inferior aspect of the interbody fusion plug.  The remainder of disc spaces are stable with degenerative disc disease particularly at L2-3.  IMPRESSION: Stable posterior fusion at L5-S1.  No change in alignment.  Original Report Authenticated By: Joretta Bachelor, M.D.   Note: Imaging results reviewed.  ROS  Cardiovascular History: Heart murmur Pulmonary or Respiratory History: Smoker Neurological History: Peripheral Neuropathy Review of Past Neurological Studies: No results found for this or any previous visit. Psychological-Psychiatric History: Anxiety, Depression and Panic Attacks Gastrointestinal History: Reflux or heatburn Genitourinary History: Nephrolithiasis and Hematuria Hematological History: Negative for anticoagulant therapy, anemia, bruising or bleeding easily, hemophilia, sickle cell disease or trait, thrombocytopenia or coagulupathies Endocrine History: Negative for diabetes or thyroid disease Rheumatologic History: Osteoarthritis, Fibromyalgia and Chronic Fatigue Syndrome Musculoskeletal History: Negative for myasthenia gravis, muscular dystrophy, multiple sclerosis or malignant hyperthermia Work History:  Disabled  Allergies  April Price is allergic to morphine and related; penicillins; shrimp [shellfish allergy]; tape; tetanus toxoids; and tylenol [acetaminophen].  Laboratory Chemistry  Inflammation Markers Lab Results  Component Value Date   ESRSEDRATE 2 02/11/2016   CRP <0.8 02/11/2016   Renal Function Lab Results  Component Value Date   BUN 19 02/11/2016   CREATININE 1.05 (H) 02/11/2016   GFRAA >60 02/11/2016   GFRNONAA 57 (L) 02/11/2016   Hepatic Function Lab Results  Component Value Date   AST 16 02/11/2016   ALT 11 (L) 02/11/2016   ALBUMIN 4.0 02/11/2016   Electrolytes Lab Results  Component Value Date   NA 137 02/11/2016   K  4.2 02/11/2016   CL 108 02/11/2016   CALCIUM 9.7 02/11/2016   MG 2.0 02/11/2016   Pain Modulating Vitamins Lab Results  Component Value Date   25OHVITD1 18 (L) 02/11/2016   25OHVITD2 <1.0 02/11/2016   25OHVITD3 18 02/11/2016   VITAMINB12 227 02/11/2016   Coagulation Parameters Lab Results  Component Value Date   PLT 233 08/23/2010   Cardiovascular Lab Results  Component Value Date   HGB 19.7 (H) 08/23/2010   HCT 53.4 (H) 08/23/2010   Note: Lab results reviewed.  [place]  Drug: April Price  reports that she does not use drugs. Alcohol:  reports that she does not drink alcohol. Tobacco:  reports that she has been smoking Cigarettes.  She has a 15.00 pack-year smoking history. She has never used smokeless tobacco. Medical:  has a past medical history of Allergy; Arthritis; Asthma; Chronic fatigue; Depression; Depression screen; Diffuse cystic mastopathy; Fibromyalgia; Fibromyalgia muscle pain; GERD (gastroesophageal reflux disease); Hypertension; and Neuromuscular disorder ([place]). Family: family history includes Heart disease in her brother, father, and mother; Vascular Disease in her sister.  Past Surgical History:  Procedure Laterality Date  . ABDOMINAL HYSTERECTOMY    . BACK SURGERY    . BREAST BIOPSY Right   . cataract     . CHOLECYSTECTOMY    . COLONOSCOPY  2006  . EYE SURGERY Bilateral 2011   cataract  . NASAL SINUS SURGERY    . NASAL SINUS SURGERY    . [place]  2003, 2012  . TYMPANOSTOMY TUBE PLACEMENT     Active Ambulatory Problems    Diagnosis Date Noted  . Fibromyalgia 01/12/2011  . Depression 01/13/2011  . History of seasonal allergies 01/13/2011  . Situational disturbance 01/13/2011  . Elevated blood pressure 01/13/2011  . Spasm of back muscles 01/31/2011  . Chronic neck pain 01/31/2011  . Dry eyes, bilateral 02/06/2012  . Hypoglycemia 02/06/2012  . Hypertension 02/06/2012  . Chronic fatigue syndrome 02/06/2012  . Osteopenia 02/06/2012  . Vaginal atrophy 02/06/2012  . History of fibrocystic disease of breast 01/08/2013  . Chronic pain syndrome 02/10/2016  . Long term current use of opiate analgesic 02/10/2016  . Long term prescription opiate use 02/10/2016  . Opiate use (140 MME/Day) 02/10/2016  . Chronic shoulder pain (Location of Primary Source of Pain) (Bilateral) (L>R) 02/10/2016  . Chronic hip pain (Location of Secondary source of pain) (Bilateral) (L>R) 02/10/2016  . Chronic sacroiliac joint pain (Location of Tertiary source of pain) (Bilateral) (L>R) 02/10/2016  . Chronic low back pain (Bilateral) (L>R) 02/10/2016  . 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 History of lumbar fusion 02/10/2016  . Chronic lower extremity pain (Bilateral) (L>R) 02/10/2016  . Chronic knee pain (Bilateral) (L>R) 02/10/2016  . Chronic elbow pain (Bilateral) (L>R) 02/10/2016  . Chronic hand pain (Bilateral) (L>R) 02/10/2016  . Musculoskeletal pain 02/11/2016  . Neurogenic pain 02/11/2016   Resolved Ambulatory Problems    Diagnosis Date Noted  . Breast screening 01/08/2013   Past Medical History:  Diagnosis Date  . Allergy   . Arthritis   . Asthma   . Chronic fatigue   . Depression   . Depression screen   . Diffuse cystic mastopathy   . Fibromyalgia   . Fibromyalgia muscle pain    . GERD (gastroesophageal reflux disease)   . Hypertension   . Neuromuscular disorder ([place])    Constitutional Exam  General appearance: Well nourished, well developed, and well hydrated. In no apparent acute distress Vitals:   02/10/16  1311  BP: (!) 167/75  Pulse: 80  Resp: 18  Temp: 98.2 F (36.8 C)  TempSrc: Oral  SpO2: 95%  Weight: 170 lb (77.1 kg)  Height: _0  (1.626 m)   BMI Assessment: Estimated body mass index is 29.18 kg/m as calculated from the following:   Height as of this encounter: _1  (1.626 m).   Weight as of this encounter: 170 lb (77.1 kg).  BMI interpretation table: BMI level Category Range association with higher incidence of chronic pain  <18 kg/m2 Underweight   18.5-24.9 kg/m2 Ideal body weight   25-29.9 kg/m2 Overweight Increased incidence by 20%  30-34.9 kg/m2 Obese (Class I) Increased incidence by 68%  35-39.9 kg/m2 Severe obesity (Class II) Increased incidence by 136%  >40 kg/m2 Extreme obesity (Class III) Increased incidence by 254%   BMI Readings from Last 4 Encounters:  02/10/16 29.18 kg/m  11/25/15 29.18 kg/m  12/10/14 28.32 kg/m  01/08/13 30.90 kg/m   Wt Readings from Last 4 Encounters:  02/10/16 170 lb (77.1 kg)  11/25/15 170 lb (77.1 kg)  12/10/14 165 lb (74.8 kg)  01/08/13 180 lb (81.6 kg)  Psych/Mental status: Alert, oriented x 3 (person, place, & time) Eyes: PERLA Respiratory: No evidence of acute respiratory distress  Cervical Spine Exam  Inspection: No masses, redness, or swelling Alignment: Symmetrical Functional ROM: Unrestricted ROM Stability: No instability detected Muscle strength & Tone: Functionally intact Sensory: Unimpaired Palpation: Non-contributory  Upper Extremity (UE) Exam    Side: Right upper extremity  Side: Left upper extremity  Inspection: No masses, redness, swelling, or asymmetry  Inspection: No masses, redness, swelling, or asymmetry  Functional ROM: Unrestricted ROM         Functional ROM:  Unrestricted ROM          Muscle strength & Tone: Functionally intact  Muscle strength & Tone: Functionally intact  Sensory: Unimpaired  Sensory: Unimpaired  Palpation: Non-contributory  Palpation: Non-contributory   Thoracic Spine Exam  Inspection: No masses, redness, or swelling Alignment: Symmetrical Functional ROM: Unrestricted ROM Stability: No instability detected Sensory: Unimpaired Muscle strength & Tone: Functionally intact Palpation: Non-contributory  Lumbar Spine Exam  Inspection: No masses, redness, or swelling Alignment: Symmetrical Functional ROM: Unrestricted ROM Stability: No instability detected Muscle strength & Tone: Functionally intact Sensory: Unimpaired Palpation: Non-contributory Provocative Tests: Lumbar Hyperextension and rotation test: evaluation deferred today       Patrick's Maneuver: evaluation deferred today              Gait & Posture Assessment  Ambulation: Unassisted Gait: Relatively normal for age and body habitus Posture: WNL   Lower Extremity Exam    Side: Right lower extremity  Side: Left lower extremity  Inspection: No masses, redness, swelling, or asymmetry  Inspection: No masses, redness, swelling, or asymmetry  Functional ROM: Unrestricted ROM          Functional ROM: Unrestricted ROM          Muscle strength & Tone: Functionally intact  Muscle strength & Tone: Functionally intact  Sensory: Unimpaired  Sensory: Unimpaired  Palpation: Non-contributory  Palpation: Non-contributory   Assessment  Primary Diagnosis & Pertinent Problem List: The primary encounter diagnosis was Chronic pain syndrome. Diagnoses of Long term current use of opiate analgesic, Long term prescription opiate use, Opiate use, Chronic pain of both shoulders, Hip pain, chronic, unspecified laterality, Chronic sacroiliac joint pain, Chronic bilateral low back pain without sciatica, Failed back surgical syndrome, History of lumbar fusion, Chronic pain of lower extremity,  bilateral, Chronic pain of both knees, Chronic elbow pain, unspecified laterality, Chronic hand pain, unspecified laterality, Chronic neck pain, Chronic fatigue syndrome, Fibromyalgia, Musculoskeletal pain, and Neurogenic pain were also pertinent to this visit.  Visit Diagnosis: 1. Chronic pain syndrome   2. Long term current use of opiate analgesic   3. Long term prescription opiate use   4. Opiate use   5. Chronic pain of both shoulders   6. Hip pain, chronic, unspecified laterality   7. Chronic sacroiliac joint pain   8. Chronic bilateral low back pain without sciatica   9. Failed back surgical syndrome   10. History of lumbar fusion   11. Chronic pain of lower extremity, bilateral   12. Chronic pain of both knees   13. Chronic elbow pain, unspecified laterality   14. Chronic hand pain, unspecified laterality   15. Chronic neck pain   16. Chronic fatigue syndrome   17. Fibromyalgia   18. Musculoskeletal pain   19. Neurogenic pain    Plan of Care  Initial Treatment Plan:  Please be advised that as per protocol, today's visit has been an evaluation only. We have not taken over the patient's controlled substance management.  Problem-Specific Plan: No problem-specific Assessment & Plan notes found for this encounter.  Ordered Lab-work, Procedure(s), Referral(s), & Consult(s): Orders Placed This Encounter  Procedures  . DG Cervical Spine Complete  . DG Lumbar Spine Complete W/Bend  . DG Si Joints  . DG Shoulder Left  . DG Shoulder Right  . DG Knee 1-2 Views Left  . DG Knee 1-2 Views Right  . DG HIP UNILAT W OR W/O PELVIS 2-3 VIEWS RIGHT  . DG HIP UNILAT W OR W/O PELVIS 2-3 VIEWS LEFT  . Compliance Drug Analysis, Ur  . Comprehensive metabolic panel  . C-reactive protein  . Magnesium  . Sedimentation rate  . Vitamin B12  . 25-Hydroxyvitamin D Lcms D2+D3  . Ambulatory referral to Psychology   Pharmacotherapy: Medications ordered:  No orders of the defined types were  placed in this encounter.  Medications administered during this visit: April Price had no medications administered during this visit.   Pharmacotherapy under consideration:  Opioid Analgesics: The patient was informed that there is no guarantee that she would be a candidate for opioid analgesics. The decision will be made following CDC guidelines. This decision will be based on the results of diagnostic studies, as well as April Price risk profile.    Interventional therapies: April Price was informed that there is no guarantee that she would be a candidate for interventional therapies. The decision will be based on the results of diagnostic studies, as well as April Price risk profile.  Procedures under consideration include: Diagnostic intra-articular shoulder joint injection  Diagnostic suprascapular nerve block  Possible bilateral suprascapular nerve radiofrequency ablation  Diagnostic bilateral intra-articular hip joint injection  Diagnostic bilateral femoral nerve and obturator nerve articular branch block  Possible bilateral femoral nerve and obturator nerve articular branch radiofrequency ablation for the hip joint pain  Diagnostic intra-articular SI joint block  Possible bilateral sacroiliac joint radiofrequency ablation  Diagnostic caudal epidural steroid injection + epidurogram  Possible Racz procedure  Diagnostic bilateral intra-articular knee joint injection  Diagnostic bilateral genicular nerve blocks  Possible bilateral genicular radiofrequency ablation    Requested PM Follow-up: Return for 2nd Visit Eval, After [facility] Eval.  No future appointments.  Primary Care Physician: Jefferey Pica, MD Location: [facility] Note by: Kathlen Brunswick Dossie Arbour, M.D,  DABA, DABAPM, DABPM, DABIPP, FIPP  Pain Score Disclaimer: We use the NRS-11 scale. This is a self-reported, subjective measurement of pain severity with only modest accuracy. It is  used primarily to identify changes within a particular patient. It must be understood that outpatient pain scales are significantly less accurate that those used for research, where they can be applied under ideal controlled circumstances with minimal exposure to variables. In reality, the score is likely to be a combination of pain intensity and pain affect, where pain affect describes the degree of emotional arousal or changes in action readiness caused by the sensory experience of pain. Factors such as social and work situation, setting, emotional state, anxiety levels, expectation, and prior pain experience may influence pain perception and show large inter-individual differences that may also be affected by time variables.  Patient instructions provided during this appointment: Patient Instructions  Pain Management Discharge Instructions  General Discharge Instructions :  If you need to reach your doctor call: Monday-Friday 8:00 am - 4:00 pm at [phone] or toll free [phone].  After clinic hours [phone] to have operator reach doctor.  Bring all of your medication bottles to all your appointments in the pain clinic.  To cancel or reschedule your appointment with Pain Management please remember to call 24 hours in advance to avoid a fee.  Refer to the educational materials which you have been given on: General Risks, I had my Procedure. Discharge Instructions, Post Sedation.  Post Procedure Instructions:  The drugs you were given will stay in your system until tomorrow, so for the next 24 hours you should not drive, make any legal decisions or drink any alcoholic beverages.  You may eat anything you prefer, but it is better to start with liquids then soups and crackers, and gradually work up to solid foods.  Please notify your doctor immediately if you have any unusual bleeding, trouble breathing or pain that is not related to your normal pain.  Depending on the type of  procedure that was done, some parts of your body may feel week and/or numb.  This usually clears up by tonight or the next day.  Walk with the use of an assistive device or accompanied by an adult for the 24 hours.  You may use ice on the affected area for the first 24 hours.  Put ice in a Ziploc bag and cover with a towel and place against area 15 minutes on 15 minutes off.  You may switch to heat after 24 hours.

## 2016-02-10 NOTE — Progress Notes (Signed)
Safety precautions to be maintained throughout the outpatient stay will include: orient to surroundings, keep bed in low position, maintain call bell within reach at all times, provide assistance with transfer out of bed and ambulation.  

## 2016-02-10 NOTE — Patient Instructions (Signed)

## 2016-02-11 ENCOUNTER — Ambulatory Visit
Admission: RE | Admit: 2016-02-11 | Discharge: 2016-02-11 | Disposition: A | Discharge status: Home / Self Care | Payer: Medicare Other | Source: Ambulatory Visit | Attending: Pain Medicine | Admitting: Pain Medicine

## 2016-02-11 ENCOUNTER — Other Ambulatory Visit
Admission: RE | Admit: 2016-02-11 | Discharge: 2016-02-11 | Disposition: A | Discharge status: Home / Self Care | Payer: Medicare Other | Source: Ambulatory Visit | Attending: Pain Medicine | Admitting: Pain Medicine

## 2016-02-11 DIAGNOSIS — M25551 Pain in right hip: Secondary | ICD-10-CM | POA: Diagnosis not present

## 2016-02-11 DIAGNOSIS — M79604 Pain in right leg: Secondary | ICD-10-CM

## 2016-02-11 DIAGNOSIS — M25529 Pain in unspecified elbow: Secondary | ICD-10-CM

## 2016-02-11 DIAGNOSIS — M4186 Other forms of scoliosis, lumbar region: Secondary | ICD-10-CM | POA: Insufficient documentation

## 2016-02-11 DIAGNOSIS — I700 Atherosclerosis of aorta: Secondary | ICD-10-CM | POA: Diagnosis not present

## 2016-02-11 DIAGNOSIS — M25559 Pain in unspecified hip: Principal | ICD-10-CM

## 2016-02-11 DIAGNOSIS — M25562 Pain in left knee: Secondary | ICD-10-CM | POA: Diagnosis not present

## 2016-02-11 DIAGNOSIS — M79643 Pain in unspecified hand: Secondary | ICD-10-CM

## 2016-02-11 DIAGNOSIS — M542 Cervicalgia: Secondary | ICD-10-CM

## 2016-02-11 DIAGNOSIS — M545 Low back pain, unspecified: Secondary | ICD-10-CM

## 2016-02-11 DIAGNOSIS — M25561 Pain in right knee: Secondary | ICD-10-CM

## 2016-02-11 DIAGNOSIS — Z981 Arthrodesis status: Secondary | ICD-10-CM

## 2016-02-11 DIAGNOSIS — G894 Chronic pain syndrome: Secondary | ICD-10-CM | POA: Insufficient documentation

## 2016-02-11 DIAGNOSIS — Z9889 Other specified postprocedural states: Secondary | ICD-10-CM | POA: Diagnosis not present

## 2016-02-11 DIAGNOSIS — M47812 Spondylosis without myelopathy or radiculopathy, cervical region: Secondary | ICD-10-CM | POA: Diagnosis not present

## 2016-02-11 DIAGNOSIS — M25511 Pain in right shoulder: Principal | ICD-10-CM

## 2016-02-11 DIAGNOSIS — M19011 Primary osteoarthritis, right shoulder: Secondary | ICD-10-CM | POA: Diagnosis not present

## 2016-02-11 DIAGNOSIS — M533 Sacrococcygeal disorders, not elsewhere classified: Secondary | ICD-10-CM | POA: Diagnosis not present

## 2016-02-11 DIAGNOSIS — G8929 Other chronic pain: Secondary | ICD-10-CM

## 2016-02-11 DIAGNOSIS — M792 Neuralgia and neuritis, unspecified: Secondary | ICD-10-CM | POA: Insufficient documentation

## 2016-02-11 DIAGNOSIS — M19012 Primary osteoarthritis, left shoulder: Secondary | ICD-10-CM | POA: Diagnosis not present

## 2016-02-11 DIAGNOSIS — M25552 Pain in left hip: Secondary | ICD-10-CM | POA: Diagnosis not present

## 2016-02-11 DIAGNOSIS — M25512 Pain in left shoulder: Principal | ICD-10-CM

## 2016-02-11 DIAGNOSIS — M79605 Pain in left leg: Secondary | ICD-10-CM

## 2016-02-11 DIAGNOSIS — I6521 Occlusion and stenosis of right carotid artery: Secondary | ICD-10-CM | POA: Insufficient documentation

## 2016-02-11 DIAGNOSIS — M961 Postlaminectomy syndrome, not elsewhere classified: Secondary | ICD-10-CM

## 2016-02-11 DIAGNOSIS — M7918 Myalgia, other site: Secondary | ICD-10-CM | POA: Insufficient documentation

## 2016-02-11 LAB — VITAMIN B12: Vitamin B-12: 227 pg/mL (ref 180–914)

## 2016-02-11 LAB — COMPREHENSIVE METABOLIC PANEL
ALT: 11 U/L — ABNORMAL LOW (ref 14–54)
AST: 16 U/L (ref 15–41)
Albumin: 4 g/dL (ref 3.5–5.0)
Alkaline Phosphatase: 80 U/L (ref 38–126)
Anion gap: 9 (ref 5–15)
BUN: 19 mg/dL (ref 6–20)
CO2: 20 mmol/L — ABNORMAL LOW (ref 22–32)
Calcium: 9.7 mg/dL (ref 8.9–10.3)
Chloride: 108 mmol/L (ref 101–111)
Creatinine, Ser: 1.05 mg/dL — ABNORMAL HIGH (ref 0.44–1.00)
GFR calc Af Amer: >60 mL/min (ref >60)
GFR calc non Af Amer: 57 mL/min — ABNORMAL LOW (ref >60)
Glucose, Bld: 102 mg/dL — ABNORMAL HIGH (ref 65–99)
Potassium: 4.2 mmol/L (ref 3.5–5.1)
Sodium: 137 mmol/L (ref 135–145)
Total Bilirubin: 0.5 mg/dL (ref 0.3–1.2)
Total Protein: 7.8 g/dL (ref 6.5–8.1)

## 2016-02-11 LAB — MAGNESIUM: Magnesium: 2 mg/dL (ref 1.7–2.4)

## 2016-02-11 LAB — SEDIMENTATION RATE: Sed Rate: 2 mm/hr (ref 0–30)

## 2016-02-11 LAB — C-REACTIVE PROTEIN: CRP: <0.8 mg/dL (ref <1.0)

## 2016-02-14 LAB — 25-HYDROXY VITAMIN D LCMS D2+D3
25-Hydroxy, Vitamin D-2: <1 ng/mL
25-Hydroxy, Vitamin D-3: 18 ng/mL
25-Hydroxy, Vitamin D: 18 ng/mL — ABNORMAL LOW

## 2016-02-15 DIAGNOSIS — F331 Major depressive disorder, recurrent, moderate: Secondary | ICD-10-CM | POA: Diagnosis not present

## 2016-02-19 LAB — COMPLIANCE DRUG ANALYSIS, UR

## 2016-02-28 DIAGNOSIS — F331 Major depressive disorder, recurrent, moderate: Secondary | ICD-10-CM | POA: Diagnosis not present

## 2016-03-05 NOTE — Progress Notes (Signed)
Results were reviewed and found to be: abnormal  Surgical consultation may be of benefit  Review would suggest interventional pain management techniques may be of benefit

## 2016-03-07 ENCOUNTER — Encounter: Payer: Self-pay | Admitting: Pain Medicine

## 2016-03-07 ENCOUNTER — Other Ambulatory Visit: Payer: Self-pay | Admitting: Pain Medicine

## 2016-03-07 DIAGNOSIS — E559 Vitamin D deficiency, unspecified: Secondary | ICD-10-CM | POA: Insufficient documentation

## 2016-03-07 MED ORDER — VITAMIN D (ERGOCALCIFEROL) 1.25 MG (50000 UNIT) PO CAPS: ORAL_CAPSULE | ORAL | 0 refills | Status: DC

## 2016-03-07 MED ORDER — VITAMIN D3 50 MCG (2000 UT) PO CAPS: ORAL_CAPSULE | ORAL | 99 refills | Status: DC

## 2016-03-07 NOTE — Progress Notes (Signed)
Results were reviewed and found to be: mildly abnormal  No acute injury or pathology identified  Review would suggest interventional pain management techniques may be of benefit 

## 2016-03-07 NOTE — Progress Notes (Signed)

## 2016-03-07 NOTE — Progress Notes (Signed)
-  Most of the CO2 in the body is in the form of bicarbonate (HCO3-). Therefore, the CO2 blood test is really a measure of bicarbonate levels. kidneys help maintain the normal bicarbonate levels. The normal range for our lab is between 22 and 28 mEq/L. Low levels may suggest kidney disease and/or metabolic acidosis. - Normal fasting (NPO x 8 hours) glucose levels are between 65-99 mg/dl, with 2 hour fasting, levels are usually less than 140 mg/dl. Any random blood glucose level greater than 200 mg/dl is considered to be Diabetes. - Normal Creatinine levels are between 0.5 and 0.9 mg/dl for our lab. Any condition that impairs the function of the kidneys is likely to raise the creatinine level in the blood. The most common causes of longstanding (chronic) kidney disease in adults are high blood pressure and diabetes. Other causes of elevated blood creatinine levels include drugs, ingestion of a large amount of dietary meat, kidney infections, rhabdomyolysis (abnormal muscle breakdown), and urinary tract obstruction. - BUN-to-creatinine ratio >20:1 (BUN dispropertionally higher than the creatinine levels) suggests prerenal azotemia (dehydration or renal hypoperfusion), while <10:1 levels suggest renal damage.   - While most low ALT level results indicate a normal healthy liver, that may not always be the case. A low-functioning or non-functioning liver, lacking normal levels of ALT activity to begin with, would not release a lot of ALT into the blood when damaged. People infected with the hepatitis C virus initially show high ALT levels in their blood, but these levels fall over time. Because the ALT test measures ALT levels at only one point in time, people with chronic hepatitis C infection may already have experienced the ALT peak well before blood was drawn for the ALT test. Urinary tract infections or malnutrition may also cause low blood ALT levels. eGFR (Estimated Glomerular Filtration Rate) results are  reported as milliliters/minute/1.49m (mL/min/1.71m. Because some laboratories do not collect information on a patient's race when the sample is collected for testing, they may report calculated results for both African Americans and non-African Americans.  The [facility] Systems[facility]s only reporting actual results once values are < 60 mL/min. 1. Normal values: 90-120 mL/min 2. Below 60 mL/min suggests that some kidney damage has occurred. 3. Between 5926nd 30 indicate (Moderate) Stage 3 kidney disease. 4. Between 29 and 15 represent (Severe) Stage 4 kidney disease. 5. Less than 15 is considered (Kidney Failure) Stage 5.

## 2016-03-28 DIAGNOSIS — F331 Major depressive disorder, recurrent, moderate: Secondary | ICD-10-CM | POA: Diagnosis not present

## 2016-04-10 HISTORY — PX: FRACTURE SURGERY: SHX138

## 2016-04-18 ENCOUNTER — Ambulatory Visit: Payer: Medicare Other | Attending: Pain Medicine | Admitting: Pain Medicine

## 2016-04-18 ENCOUNTER — Encounter: Payer: Self-pay | Admitting: Pain Medicine

## 2016-04-18 VITALS — BP 164/68 | HR 94 | Temp 98.5°F | Resp 16 | Ht 64.0 in | Wt 170.0 lb

## 2016-04-18 DIAGNOSIS — H04123 Dry eye syndrome of bilateral lacrimal glands: Secondary | ICD-10-CM | POA: Insufficient documentation

## 2016-04-18 DIAGNOSIS — M25522 Pain in left elbow: Secondary | ICD-10-CM | POA: Insufficient documentation

## 2016-04-18 DIAGNOSIS — F1190 Opioid use, unspecified, uncomplicated: Secondary | ICD-10-CM

## 2016-04-18 DIAGNOSIS — M25562 Pain in left knee: Secondary | ICD-10-CM | POA: Insufficient documentation

## 2016-04-18 DIAGNOSIS — Z79891 Long term (current) use of opiate analgesic: Secondary | ICD-10-CM | POA: Diagnosis not present

## 2016-04-18 DIAGNOSIS — M5416 Radiculopathy, lumbar region: Secondary | ICD-10-CM

## 2016-04-18 DIAGNOSIS — M25511 Pain in right shoulder: Secondary | ICD-10-CM | POA: Insufficient documentation

## 2016-04-18 DIAGNOSIS — M791 Myalgia: Secondary | ICD-10-CM | POA: Insufficient documentation

## 2016-04-18 DIAGNOSIS — E559 Vitamin D deficiency, unspecified: Secondary | ICD-10-CM | POA: Insufficient documentation

## 2016-04-18 DIAGNOSIS — M25521 Pain in right elbow: Secondary | ICD-10-CM | POA: Diagnosis not present

## 2016-04-18 DIAGNOSIS — F329 Major depressive disorder, single episode, unspecified: Secondary | ICD-10-CM | POA: Diagnosis not present

## 2016-04-18 DIAGNOSIS — M25561 Pain in right knee: Secondary | ICD-10-CM | POA: Insufficient documentation

## 2016-04-18 DIAGNOSIS — I10 Essential (primary) hypertension: Secondary | ICD-10-CM | POA: Diagnosis not present

## 2016-04-18 DIAGNOSIS — M79641 Pain in right hand: Secondary | ICD-10-CM | POA: Insufficient documentation

## 2016-04-18 DIAGNOSIS — M25551 Pain in right hip: Secondary | ICD-10-CM | POA: Insufficient documentation

## 2016-04-18 DIAGNOSIS — M25552 Pain in left hip: Secondary | ICD-10-CM | POA: Diagnosis not present

## 2016-04-18 DIAGNOSIS — M79642 Pain in left hand: Secondary | ICD-10-CM | POA: Diagnosis not present

## 2016-04-18 DIAGNOSIS — M25512 Pain in left shoulder: Secondary | ICD-10-CM | POA: Insufficient documentation

## 2016-04-18 DIAGNOSIS — G894 Chronic pain syndrome: Secondary | ICD-10-CM | POA: Insufficient documentation

## 2016-04-18 DIAGNOSIS — Z981 Arthrodesis status: Secondary | ICD-10-CM | POA: Insufficient documentation

## 2016-04-18 DIAGNOSIS — M533 Sacrococcygeal disorders, not elsewhere classified: Secondary | ICD-10-CM | POA: Diagnosis not present

## 2016-04-18 DIAGNOSIS — G8929 Other chronic pain: Secondary | ICD-10-CM

## 2016-04-18 DIAGNOSIS — M25559 Pain in unspecified hip: Secondary | ICD-10-CM

## 2016-04-18 DIAGNOSIS — M8580 Other specified disorders of bone density and structure, unspecified site: Secondary | ICD-10-CM | POA: Insufficient documentation

## 2016-04-18 DIAGNOSIS — M797 Fibromyalgia: Secondary | ICD-10-CM | POA: Diagnosis not present

## 2016-04-18 DIAGNOSIS — M961 Postlaminectomy syndrome, not elsewhere classified: Secondary | ICD-10-CM

## 2016-04-18 MED ORDER — TRAMADOL HCL 50 MG PO TABS: 50.0000 mg | ORAL_TABLET | Freq: Four times a day (QID) | ORAL | 0 refills | Status: DC

## 2016-04-18 MED ORDER — TIZANIDINE HCL 4 MG PO TABS: 8.0000 mg | ORAL_TABLET | Freq: Three times a day (TID) | ORAL | 0 refills | Status: DC | PRN

## 2016-04-18 MED ORDER — DIPHENHYDRAMINE HCL 50 MG PO CAPS: ORAL_CAPSULE | ORAL | 0 refills | Status: DC

## 2016-04-18 MED ORDER — PREDNISONE 50 MG PO TABS: ORAL_TABLET | ORAL | 0 refills | Status: DC

## 2016-04-18 MED ORDER — FENTANYL 25 MCG/HR TD PT72: 25.0000 ug | MEDICATED_PATCH | TRANSDERMAL | 0 refills | Status: DC

## 2016-04-18 MED ORDER — NALOXONE HCL 2 MG/2ML IJ SOSY: PREFILLED_SYRINGE | INTRAMUSCULAR | 1 refills | Status: DC

## 2016-04-18 NOTE — Progress Notes (Signed)
Patient's Name: April Price  MRN: [id number]  Referring Provider: Frederich Cha, MD  DOB: 1955-08-30  PCP: Frederich Cha, MD  DOS: 04/18/2016  Note by: Kathlen Brunswick. Dossie Arbour, MD  Service setting: Ambulatory outpatient  Specialty: Interventional Pain Management  Location: [facility] (AMB) Pain Management Facility    Patient type: Established   Primary Reason(s) for Visit: Encounter for evaluation before starting new chronic pain management plan of care (Level of risk: moderate) CC: Back Pain (lower, left)  HPI  April Price is a 61 y.o. year old, female patient, who comes today for a follow-up evaluation to review the test results and decide on a treatment plan. She has Fibromyalgia; Depression; History of seasonal allergies; Situational disturbance; Elevated blood pressure; Spasm of back muscles; Chronic neck pain; Dry eyes, bilateral; Hypoglycemia; Hypertension; Chronic fatigue syndrome; Osteopenia; Vaginal atrophy; History of fibrocystic disease of breast; Chronic pain syndrome; Long term current use of opiate analgesic; Long term prescription opiate use; Opiate use (80-140 MME/Day); Chronic shoulder pain (Location of Primary Source of Pain) (Bilateral) (L>R); Chronic hip pain (Location of Secondary source of pain) (Bilateral) (L>R); Chronic sacroiliac joint pain (Location of Tertiary source of pain) (Bilateral) (L>R); Chronic low back pain (Bilateral) (L>R); Failed back surgical syndrome; History of lumbar fusion; Chronic lower extremity pain (Bilateral) (L>R); Chronic knee pain (Bilateral) (L>R); Chronic elbow pain (Bilateral) (L>R); Chronic hand pain (Bilateral) (L>R); Musculoskeletal pain; Neurogenic pain; Vitamin D deficiency; and Chronic lumbar radicular pain (Left) on her problem list. Her primarily concern today is the Back Pain (lower, left)  Pain Assessment: Self-Reported Pain Score: 5 /10             Reported level is compatible with observation.       Pain Type: Chronic pain Pain  Location: Back Pain Orientation: Lower Pain Descriptors / Indicators: Dull, Aching, Sharp Pain Frequency: Intermittent  April Price comes in today for a follow-up visit after her initial evaluation on 02/10/2016. Today we went over the results of her tests. These were explained in "Layman's terms". During today's appointment we went over my diagnostic impression, as well as the proposed treatment plan.  In considering the treatment plan options, April Price was reminded that I no longer take patients for medication management only. I asked her to let me know if she had no intention of taking advantage of the interventional therapies, so that we could make arrangements to provide this space to someone interested. I also made it clear that undergoing interventional therapies for the purpose of getting pain medications is very inappropriate on the part of a patient, and it will not be tolerated in this practice. This type of behavior would suggest true addiction and therefore it requires referral to an addiction specialist.   Further details on both, my assessment(s), as well as the proposed treatment plan, please see below. Controlled Substance Pharmacotherapy Assessment REMS (Risk Evaluation and Mitigation Strategy)  Analgesic: Tramadol 50 mg by mouth 4 times a day + Duragesic patch 25 g per hour every 72 hours MME/day: 80 mg/day Pill Count: None expected due to no prior prescriptions written by our practice. Pharmacokinetics: Liberation and absorption (onset of action): WNL Distribution (time to peak effect): WNL Metabolism and excretion (duration of action): WNL         Pharmacodynamics: Desired effects: Analgesia: April Price reports >50% benefit. Functional ability: Patient reports that medication allows her to accomplish basic ADLs Clinically meaningful improvement in function (CMIF): Sustained CMIF goals met Perceived effectiveness: Described as relatively effective,  allowing for  increase in activities of daily living (ADL) Undesirable effects: Side-effects or Adverse reactions: None reported Monitoring: [place] PMP: Online review of the past 10-monthperiod previously conducted. Not applicable at this point since we have not taken over the patient's medication management yet. List of all UDS test(s) done:  Lab Results  Component Value Date   SUMMARY FINAL 02/10/2016   Last UDS on record: Summary  Date Value Ref Range Status  02/10/2016 FINAL  Final    Comment:    ==================================================================== [facility]RE COMP DRUG ANALYSIS,UR ==================================================================== Test                             Result       Flag       Units Drug Present and Declared for Prescription Verification   Fentanyl                       9            EXPECTED   ng/mg creat   Norfentanyl                    95           EXPECTED   ng/mg creat    Source of fentanyl is a scheduled prescription medication,    including IV, patch, and transmucosal formulations. Norfentanyl    is an expected metabolite of fentanyl.   Tramadol                       PRESENT      EXPECTED   O-Desmethyltramadol            PRESENT      EXPECTED   N-Desmethyltramadol            PRESENT      EXPECTED    Source of tramadol is a prescription medication.    O-desmethyltramadol and N-desmethyltramadol are expected    metabolites of tramadol.   Tizanidine                     PRESENT      EXPECTED   Ibuprofen                      PRESENT      EXPECTED   Theophylline                   PRESENT      EXPECTED Drug Absent but Declared for Prescription Verification   Trazodone                      Not Detected UNEXPECTED ==================================================================== Test                      Result    Flag   Units      Ref Range   Creatinine              56               mg/dL       >=20 ==================================================================== Declared Medications:  The flagging and interpretation on this report are based on the  following declared medications.  Unexpected results may arise from  inaccuracies in the declared medications.  **Note: The testing scope of this panel includes these medications:  Fentanyl (Duragesic)  Theophylline (Theo-24)  Tramadol (Ultram)  Trazodone (Desyrel)  **Note: The testing scope of this panel does not include small to  moderate amounts of these reported medications:  Ibuprofen (Advil)  Tizanidine (Zanaflex)  **Note: The testing scope of this panel does not include following  reported medications:  Albuterol (Proventil)  Buspirone (BuSpar)  Epinephrine (EpiPen)  Eye Drops  Glycerin  Hydrochlorothiazide (Hydrodiuril)  Nicotine (Nicotrol)  Omeprazole (Nexium)  Ranitidine (Zantac) ==================================================================== For clinical consultation, please call [phone]. ====================================================================    UDS interpretation: Unexpected findings not considered significantly abnormal          Medication Assessment Form: Patient introduced to form today Treatment compliance: Treatment may start today if patient agrees with proposed plan. Evaluation of compliance is not applicable at this point Risk Assessment Profile: Aberrant behavior: See initial evaluations. None observed or detected today Comorbid factors increasing risk of overdose: See initial evaluation. No additional risks detected today Risk Mitigation Strategies:  Patient opioid safety counseling: Completed today. Counseling provided to patient as per "Patient Counseling Document". Document signed by patient, attesting to counseling and understanding Patient-Prescriber Agreement (PPA): Obtained today  Controlled substance notification to other providers: Written and sent  today  Pharmacologic Plan: Today we may be taking over the patient's pharmacological regimen. See below  Laboratory Chemistry  Inflammation Markers Lab Results  Component Value Date   ESRSEDRATE 2 02/11/2016   CRP <0.8 02/11/2016   Renal Function Lab Results  Component Value Date   BUN 19 02/11/2016   CREATININE 1.05 (H) 02/11/2016   GFRAA >60 02/11/2016   GFRNONAA 57 (L) 02/11/2016   Hepatic Function Lab Results  Component Value Date   AST 16 02/11/2016   ALT 11 (L) 02/11/2016   ALBUMIN 4.0 02/11/2016   Electrolytes Lab Results  Component Value Date   NA 137 02/11/2016   K 4.2 02/11/2016   CL 108 02/11/2016   CALCIUM 9.7 02/11/2016   MG 2.0 02/11/2016   Pain Modulating Vitamins Lab Results  Component Value Date   25OHVITD1 18 (L) 02/11/2016   25OHVITD2 <1.0 02/11/2016   25OHVITD3 18 02/11/2016   VITAMINB12 227 02/11/2016   Coagulation Parameters Lab Results  Component Value Date   PLT 233 08/23/2010   Cardiovascular Lab Results  Component Value Date   HGB 19.7 (H) 08/23/2010   HCT 53.4 (H) 08/23/2010   Note: Lab results reviewed.  Recent Diagnostic Imaging Review  Dg Cervical Spine Complete Result Date: 02/11/2016 CLINICAL DATA:  History of fibromyalgia and arthritis.  Pain. EXAM: CERVICAL SPINE - COMPLETE 4+ VIEW COMPARISON:  None. FINDINGS: The pre odontoid space is normal. The prevertebral soft tissues are unremarkable. There is reversal of normal lordosis centered at C4. Anterior listhesis of C3 versus C4 measures 2.5 mm. This may be degenerative in nature given the lack of a trauma history. No other malalignment. Multilevel degenerative changes most marked at C4-5 and C5-6 and C6-7. Probable narrowing of the left C3-4 neural foramen. Uncovertebral degenerative changes are noted. The lateral masses of C1 align with C2. The lung apices are normal. Calcification in the right neck may be associated with the carotid artery. IMPRESSION: 1. Anterolisthesis of  C3 versus C4 is likely degenerative given the lack of a trauma history. Multilevel degenerative changes are seen as noted above. Probable narrowing of the left C3-4 neural foramen based on limited oblique imaging. Electronically Signed   By: Dorise Bullion III M.D   On: 02/11/2016 09:10   Dg Lumbar Spine Complete W/bend Result Date:  02/11/2016 CLINICAL DATA:  Chronic pain EXAM: LUMBAR SPINE - COMPLETE WITH BENDING VIEWS COMPARISON:  November 21, 2010 FINDINGS: Frontal, standing neutral lateral, standing flexion lateral, standing extension lateral, and bilateral oblique views were obtained. . There are 5 non-rib-bearing lumbar type vertebral bodies. There is lumbar levoscoliosis with a rotatory component. Patient has had pedicle screws placed at L5 and S1 with a disc spacer at L5-S1. Support hardware appears intact. There is no fracture. There is 4 mm of anterolisthesis of L5 on S1 with neutral positioning. There is no appreciable change in lateral alignment with flexion-extension. There is no other spondylolisthesis. There is disc space narrowing at all levels. There has been progression of disc space narrowing at L1-2 since prior study. There is facet osteoarthritic change at all levels bilaterally. There is atherosclerotic calcification in the aorta and iliac arteries. IMPRESSION: 4 mm of anterolisthesis of L5 on S1 without appreciable change between flexion extension. No other spondylolisthesis. No fracture. Multilevel arthropathy, slightly progressed from 2012 study. Scoliosis is present. Postoperative change at L5 and S1. Electronically Signed   By: Lowella Grip III M.D.   On: 02/11/2016 09:12   Dg Si Joints Result Date: 02/11/2016 CLINICAL DATA:  Pain.  History of fibromyalgia. EXAM: BILATERAL SACROILIAC JOINTS - 3+ VIEW COMPARISON:  None. FINDINGS: Surgical hardware seen at L5 and S1, in good position on provided views. No evidence of hardware failure. The SI joints are unremarkable with no erosions  or significant sclerosis. Phleboliths seen in the pelvis. No fractures identified. IMPRESSION: No significant abnormalities. Electronically Signed   By: Dorise Bullion III M.D   On: 02/11/2016 09:14   Dg Shoulder Right Result Date: 02/11/2016 CLINICAL DATA:  Chronic pain EXAM: RIGHT SHOULDER - 2+ VIEW COMPARISON:  None. FINDINGS: Frontal, Y scapular, and axillary images were obtained. There is no fracture or dislocation. There is slight narrowing of the acromioclavicular joint. The glenohumeral joint appears unremarkable. No erosive change. Visualized right lung is clear. IMPRESSION: Slight osteoarthritic change in the acromioclavicular joint. Glenohumeral joint appears unremarkable. No erosive change. No fracture or dislocation. Electronically Signed   By: Lowella Grip III M.D.   On: 02/11/2016 09:14   Dg Knee 1-2 Views Left Result Date: 02/11/2016 CLINICAL DATA:  Pain.  No known injury. EXAM: LEFT KNEE - 1-2 VIEW COMPARISON:  None. FINDINGS: No acute soft tissue or bony abnormality identified. No focal bony abnormality identified . IMPRESSION: No acute or focal abnormality. Electronically Signed   By: Marcello Moores  Register   On: 02/11/2016 09:16   Dg Knee 1-2 Views Right Result Date: 02/11/2016 CLINICAL DATA:  Pain. EXAM: RIGHT KNEE - 1-2 VIEW COMPARISON:  No recent prior. FINDINGS: No acute or focal soft tissue bony abnormality identified. No evidence of fracture or dislocation. IMPRESSION: No acute or focal abnormality. Electronically Signed   By: Marcello Moores  Register   On: 02/11/2016 09:16   Dg Shoulder Left Result Date: 02/11/2016 CLINICAL DATA:  Chronic pain EXAM: LEFT SHOULDER - 2+ VIEW COMPARISON:  None. FINDINGS: Frontal, Y scapular, and axillary images were obtained. There is no fracture or dislocation. There is mild generalized osteoarthritic change. No erosive change. Visualized left lung is clear. There is aortic atherosclerosis. IMPRESSION: Mild generalized osteoarthritic change. No fracture  or dislocation. Aortic atherosclerosis. Electronically Signed   By: Lowella Grip III M.D.   On: 02/11/2016 09:13   Dg Hip Unilat W Or W/o Pelvis 2-3 Views Left Result Date: 02/11/2016 CLINICAL DATA:  Chronic pain EXAM: DG HIP (WITH OR  WITHOUT PELVIS) 2-3V LEFT COMPARISON:  None. FINDINGS: Frontal pelvis as well as frontal and lateral left hip images were obtained. There is no fracture or dislocation. The joint spaces appear normal. No erosive change. There is postoperative change at L5 and S1 in the lumbar spine. IMPRESSION: No fracture or dislocation.  No evident arthropathy. Electronically Signed   By: Lowella Grip III M.D.   On: 02/11/2016 09:03   Dg Hip Unilat W Or W/o Pelvis 2-3 Views Right Result Date: 02/11/2016 CLINICAL DATA:  Pain. EXAM: DG HIP (WITH OR WITHOUT PELVIS) 2-3V RIGHT COMPARISON:  No recent prior. FINDINGS: Lumbosacral spine fusion. Hardware intact. Degenerative changes lumbar spine and both hips. No acute bony or joint abnormality identified. No evidence of fracture or dislocation. Pelvic calcifications consistent with phleboliths. IMPRESSION: 1.  Lumbar sacral spine fusion.  Hardware intact. 2. No acute bony abnormality identified. Electronically Signed   By: Marcello Moores  Register   On: 02/11/2016 09:15   Cervical Imaging: Cervical DG complete:  Results for orders placed during the hospital encounter of 02/11/16  DG Cervical Spine Complete   Narrative CLINICAL DATA:  History of fibromyalgia and arthritis.  Pain.  EXAM: CERVICAL SPINE - COMPLETE 4+ VIEW  COMPARISON:  None.  FINDINGS: The pre odontoid space is normal. The prevertebral soft tissues are unremarkable. There is reversal of normal lordosis centered at C4. Anterior listhesis of C3 versus C4 measures 2.5 mm. This may be degenerative in nature given the lack of a trauma history. No other malalignment. Multilevel degenerative changes most marked at C4-5 and C5-6 and C6-7. Probable narrowing of the left C3-4  neural foramen. Uncovertebral degenerative changes are noted. The lateral masses of C1 align with C2. The lung apices are normal. Calcification in the right neck may be associated with the carotid artery.  IMPRESSION: 1. Anterolisthesis of C3 versus C4 is likely degenerative given the lack of a trauma history. Multilevel degenerative changes are seen as noted above. Probable narrowing of the left C3-4 neural foramen based on limited oblique imaging.   Electronically Signed   By: Dorise Bullion III M.D   On: 02/11/2016 09:10    Shoulder Imaging: Gaston Islam DG:  Results for orders placed during the hospital encounter of 02/11/16  DG Shoulder Right   Narrative CLINICAL DATA:  Chronic pain  EXAM: RIGHT SHOULDER - 2+ VIEW  COMPARISON:  None.  FINDINGS: Frontal, Y scapular, and axillary images were obtained. There is no fracture or dislocation. There is slight narrowing of the acromioclavicular joint. The glenohumeral joint appears unremarkable. No erosive change. Visualized right lung is clear.  IMPRESSION: Slight osteoarthritic change in the acromioclavicular joint. Glenohumeral joint appears unremarkable. No erosive change. No fracture or dislocation.   Electronically Signed   By: Lowella Grip III M.D.   On: 02/11/2016 09:14    Shoulder-L DG:  Results for orders placed during the hospital encounter of 02/11/16  DG Shoulder Left   Narrative CLINICAL DATA:  Chronic pain  EXAM: LEFT SHOULDER - 2+ VIEW  COMPARISON:  None.  FINDINGS: Frontal, Y scapular, and axillary images were obtained. There is no fracture or dislocation. There is mild generalized osteoarthritic change. No erosive change. Visualized left lung is clear. There is aortic atherosclerosis.  IMPRESSION: Mild generalized osteoarthritic change. No fracture or dislocation. Aortic atherosclerosis.   Electronically Signed   By: Lowella Grip III M.D.   On: 02/11/2016 09:13     Lumbosacral Imaging: Lumbar MR wo contrast:  Results for orders placed in visit  on 08/05/10  MR L Spine Ltd W/O Cm   Narrative * PRIOR REPORT IMPORTED FROM AN EXTERNAL SYSTEM *   PRIOR REPORT IMPORTED FROM THE [facility] WORKFLOW SYSTEM   REASON FOR EXAM:    pain lt side  COMMENTS:   PROCEDURE:     MR  - MR LUMBAR SPINE WO CONTRAST  - Aug 05 2010  9:46AM   RESULT:   HISTORY:   Pain.   FINDINGS:  Multiplanar, multisequence imaging of the lumbar spine is  obtained. Lumbar vertebrae are numbered with the lowest segmental full  size  lumbar vertebrae as L5. There is a large left paracentral L5-S1 disc  protrusion. There may be a large sequestered fragment. There is flattening  of the thecal sac and displacement of the thecal sac to the right. Thecal  sac measures 5 mm in transverse diameter x 9 mm in AP diameter. Left  neural  foramen at this level is narrowed. No other disc protrusions are noted.  Multilevel disc degeneration and annular bulge is present. Lumbar cord is  normal. No paraspinal abnormality is identified. No acute bony abnormality  is identified.   IMPRESSION:   L5-S1 large left paracentral disc protrusion with probable sequestered  fragment. There is narrowing of the left neural foramen and prominent  spinal  stenosis at this level. Left lateral recess is severely compressed at this  level.   Thank you for the opportunity to contribute to the care of your patient.       Lumbar MR w/wo contrast:  Results for orders placed in visit on 01/07/13  MR Lumbar Spine W Wo Contrast   Narrative * PRIOR REPORT IMPORTED FROM AN EXTERNAL SYSTEM *   PRIOR REPORT IMPORTED FROM THE [facility] WORKFLOW SYSTEM   REASON FOR EXAM:    back pain   with radiation  COMMENTS:   PROCEDURE:     MR  - MR LUMBAR SPINE WO/W  - Jan 07 2013 10:09AM   RESULT:     MRI LUMBAR SPINE WITHOUT AND WITH CONTRAST   HISTORY: Back pain   COMPARISON: 08/05/2010   TECHNIQUE: Standard MRI sequences  of the lumbar spine were obtained both  pre- and post-administration of 16 ml of intravenous Magnevist.   FINDINGS:   The vertebral bodies of the lumbar spine are normal in size and alignment.  There is normal bone marrow signal demonstrated throughout the vertebra.   There is posterior spinal fusion at L5-S1 with bilateral pedicle screws  present at each level. There is an intervertebral spacer device at L5-S1  in  satisfactory position. There are no areas of abnormal enhancement.   The spinal cord is of normal volume and contour. The cord terminates  normally at T12-L1 . The nerve roots of the cauda equina and the filum  terminale have the usual appearance.   The visualized portions of the SI joints are unremarkable.   The imaged intra-abdominal contents are unremarkable.   T12-L1: Minimal broad-based disc bulge. No evidence of neural foraminal or  central stenosis.   L1-L2: Minimal broad-based disc bulge. No evidence of neural foraminal or  central stenosis.   L2-L3: Mild broad-based disc bulge with a right paracentral annular tear.  Mild right foraminal stenosis. No left foraminal stenosis. No central  canal  stenosis.   L3-L4: Mild broad-based disc bulge. Moderate bilateral facet arthropathy.  No  significant foraminal stenosis. Mild central canal stenosis.   L4-L5: Mild broad-based disc bulge eccentric towards the left with a  foraminal component. There is moderate left foraminal stenosis. There is  no  right foraminal stenosis.   L5-S1: No significant disc bulge. No abnormal enhancement. The left neural  foramen is suboptimally evaluated secondary to susceptibility artifact  resulting from the orthopedic hardware. Is evidence of prior L5  laminectomy.   IMPRESSION:   1. Posterior spinal fusion at L5-S1 without failure or complication.   2. Lumbar spine spondylosis as described above.   Dictation Site: 1       Lumbar DG 2-3 views:  Results for orders  placed during the hospital encounter of 11/21/10  DG Lumbar Spine 2-3 Views   Narrative *RADIOLOGY REPORT*  Clinical Data: History of lumbar spine surgery, follow-up  LUMBAR SPINE - 2-3 VIEW  Comparison: Lumbar spine films of 10/03/2010  Findings: Hardware for posterior fusion at L5-S1 appears stable. The interbody fusion plug is unchanged in position and in height. There is still some lucency around the inferior aspect of the interbody fusion plug.  The remainder of disc spaces are stable with degenerative disc disease particularly at L2-3.  IMPRESSION: Stable posterior fusion at L5-S1.  No change in alignment.  Original Report Authenticated By: Joretta Bachelor, M.D.   Lumbar DG Bending views:  Results for orders placed during the hospital encounter of 02/11/16  DG Lumbar Spine Complete W/Bend   Narrative CLINICAL DATA:  Chronic pain  EXAM: LUMBAR SPINE - COMPLETE WITH BENDING VIEWS  COMPARISON:  November 21, 2010  FINDINGS: Frontal, standing neutral lateral, standing flexion lateral, standing extension lateral, and bilateral oblique views were obtained. . There are 5 non-rib-bearing lumbar type vertebral bodies. There is lumbar levoscoliosis with a rotatory component. Patient has had pedicle screws placed at L5 and S1 with a disc spacer at L5-S1. Support hardware appears intact. There is no fracture. There is 4 mm of anterolisthesis of L5 on S1 with neutral positioning. There is no appreciable change in lateral alignment with flexion-extension. There is no other spondylolisthesis. There is disc space narrowing at all levels. There has been progression of disc space narrowing at L1-2 since prior study. There is facet osteoarthritic change at all levels bilaterally. There is atherosclerotic calcification in the aorta and iliac arteries.  IMPRESSION: 4 mm of anterolisthesis of L5 on S1 without appreciable change between flexion extension. No other spondylolisthesis. No  fracture. Multilevel arthropathy, slightly progressed from 2012 study. Scoliosis is present. Postoperative change at L5 and S1.   Electronically Signed   By: Lowella Grip III M.D.   On: 02/11/2016 09:12    Sacroiliac Joint Imaging: Sacroiliac Joint DG:  Results for orders placed during the hospital encounter of 02/11/16  DG Si Joints   Narrative CLINICAL DATA:  Pain.  History of fibromyalgia.  EXAM: BILATERAL SACROILIAC JOINTS - 3+ VIEW  COMPARISON:  None.  FINDINGS: Surgical hardware seen at L5 and S1, in good position on provided views. No evidence of hardware failure. The SI joints are unremarkable with no erosions or significant sclerosis. Phleboliths seen in the pelvis. No fractures identified.  IMPRESSION: No significant abnormalities.   Electronically Signed   By: Dorise Bullion III M.D   On: 02/11/2016 09:14    Hip Imaging: Hip-R DG 2-3 views:  Results for orders placed during the hospital encounter of 02/11/16  DG HIP UNILAT W OR W/O PELVIS 2-3 VIEWS RIGHT   Narrative CLINICAL DATA:  Pain.  EXAM: DG HIP (WITH OR WITHOUT PELVIS) 2-3V RIGHT  COMPARISON:  No recent prior.  FINDINGS: Lumbosacral spine  fusion. Hardware intact. Degenerative changes lumbar spine and both hips. No acute bony or joint abnormality identified. No evidence of fracture or dislocation. Pelvic calcifications consistent with phleboliths.  IMPRESSION: 1.  Lumbar sacral spine fusion.  Hardware intact.  2. No acute bony abnormality identified.   Electronically Signed   By: Marcello Moores  Register   On: 02/11/2016 09:15    Hip-L DG 2-3 views:  Results for orders placed during the hospital encounter of 02/11/16  DG HIP UNILAT W OR W/O PELVIS 2-3 VIEWS LEFT   Narrative CLINICAL DATA:  Chronic pain  EXAM: DG HIP (WITH OR WITHOUT PELVIS) 2-3V LEFT  COMPARISON:  None.  FINDINGS: Frontal pelvis as well as frontal and lateral left hip images were obtained. There is no fracture  or dislocation. The joint spaces appear normal. No erosive change. There is postoperative change at L5 and S1 in the lumbar spine.  IMPRESSION: No fracture or dislocation.  No evident arthropathy.   Electronically Signed   By: Lowella Grip III M.D.   On: 02/11/2016 09:03    Knee Imaging: Knee-R DG 1-2 views:  Results for orders placed during the hospital encounter of 02/11/16  DG Knee 1-2 Views Right   Narrative CLINICAL DATA:  Pain.  EXAM: RIGHT KNEE - 1-2 VIEW  COMPARISON:  No recent prior.  FINDINGS: No acute or focal soft tissue bony abnormality identified. No evidence of fracture or dislocation.  IMPRESSION: No acute or focal abnormality.   Electronically Signed   By: Marcello Moores  Register   On: 02/11/2016 09:16    Knee-L DG 1-2 views:  Results for orders placed during the hospital encounter of 02/11/16  DG Knee 1-2 Views Left   Narrative CLINICAL DATA:  Pain.  No known injury.  EXAM: LEFT KNEE - 1-2 VIEW  COMPARISON:  None.  FINDINGS: No acute soft tissue or bony abnormality identified. No focal bony abnormality identified .  IMPRESSION: No acute or focal abnormality.   Electronically Signed   By: Marcello Moores  Register   On: 02/11/2016 09:16    Note: Results of ordered imaging test(s) reviewed and explained to patient in Layman's terms. Copy of results provided to patient  Meds  The patient has a current medication list which includes the following prescription(s): albuterol, vitamin d3, epinephrine, esomeprazole, fentanyl, fentanyl, ibuprofen, ranitidine, theophylline, tizanidine, tramadol, vitamin d (ergocalciferol), diphenhydramine, naloxone, and prednisone.  Current Outpatient Prescriptions on File Prior to Visit  Medication Sig  . albuterol (PROVENTIL HFA;VENTOLIN HFA) 108 (90 Base) MCG/ACT inhaler Inhale 2 puffs into the lungs every 4 (four) hours as needed for wheezing or shortness of breath.  . Cholecalciferol (VITAMIN D3) 2000 units  capsule Take 1 capsule (2,000 Units total) by mouth daily.  Marland Kitchen EPINEPHrine (EPIPEN 2-PAK) 0.3 mg/0.3 mL IJ SOAJ injection Inject 0.3 mLs (0.3 mg total) into the muscle once.  Marland Kitchen esomeprazole (NEXIUM) 40 MG capsule Take 1 capsule (40 mg total) by mouth daily.  . fentaNYL (DURAGESIC) 25 MCG/HR patch To be filled between 05/12/2006 seen in 06/09/2016  . ibuprofen (ADVIL,MOTRIN) 800 MG tablet Take 1 tablet (800 mg total) by mouth every 8 (eight) hours as needed for pain.  . ranitidine (ZANTAC) 150 MG tablet Take 1 tablet (150 mg total) by mouth 2 (two) times daily.  . theophylline (THEO-24) 300 MG 24 hr capsule Take 1 capsule (300 mg total) by mouth daily.  . Vitamin D, Ergocalciferol, (DRISDOL) 50000 units CAPS capsule Take 1 capsule (50,000 Units total) by mouth 2 (two) times a  week. X 6 weeks.   No current facility-administered medications on file prior to visit.    ROS  Constitutional: Denies any fever or chills Gastrointestinal: No reported hemesis, hematochezia, vomiting, or acute GI distress Musculoskeletal: Denies any acute onset joint swelling, redness, loss of ROM, or weakness Neurological: No reported episodes of acute onset apraxia, aphasia, dysarthria, agnosia, amnesia, paralysis, loss of coordination, or loss of consciousness  Allergies  Ms. Laureano is allergic to morphine and related; penicillins; shrimp [shellfish allergy]; tape; tetanus toxoids; and tylenol [acetaminophen].  [place]  Drug: Ms. Popiel  reports that she does not use drugs. Alcohol:  reports that she does not drink alcohol. Tobacco:  reports that she has been smoking Cigarettes.  She has a 15.00 pack-year smoking history. She has never used smokeless tobacco. Medical:  has a past medical history of Allergy; Arthritis; Asthma; Chronic fatigue; Depression; Depression screen; Diffuse cystic mastopathy; Fibromyalgia; Fibromyalgia muscle pain; GERD (gastroesophageal reflux disease); Hypertension; and Neuromuscular disorder  ([place]). Family: family history includes Heart disease in her brother, father, and mother; Vascular Disease in her sister.  Past Surgical History:  Procedure Laterality Date  . ABDOMINAL HYSTERECTOMY    . BACK SURGERY    . BREAST BIOPSY Right   . cataract    . CHOLECYSTECTOMY    . COLONOSCOPY  2006  . EYE SURGERY Bilateral 2011   cataract  . NASAL SINUS SURGERY    . NASAL SINUS SURGERY    . [place]  2003, 2012  . TYMPANOSTOMY TUBE PLACEMENT     Constitutional Exam  General appearance: Well nourished, well developed, and well hydrated. In no apparent acute distress Vitals:   04/18/16 0835  BP: (!) 164/68  Pulse: 94  Resp: 16  Temp: 98.5 F (36.9 C)  TempSrc: Oral  SpO2: 94%  Weight: 170 lb (77.1 kg)  Height: 5' 4" (1.626 m)   BMI Assessment: Estimated body mass index is 29.18 kg/m as calculated from the following:   Height as of this encounter: 5' 4" (1.626 m).   Weight as of this encounter: 170 lb (77.1 kg).  BMI interpretation table: BMI level Category Range association with higher incidence of chronic pain  <18 kg/m2 Underweight   18.5-24.9 kg/m2 Ideal body weight   25-29.9 kg/m2 Overweight Increased incidence by 20%  30-34.9 kg/m2 Obese (Class I) Increased incidence by 68%  35-39.9 kg/m2 Severe obesity (Class II) Increased incidence by 136%  >40 kg/m2 Extreme obesity (Class III) Increased incidence by 254%   BMI Readings from Last 4 Encounters:  04/18/16 29.18 kg/m  02/10/16 29.18 kg/m  11/25/15 29.18 kg/m  12/10/14 28.32 kg/m   Wt Readings from Last 4 Encounters:  04/18/16 170 lb (77.1 kg)  02/10/16 170 lb (77.1 kg)  11/25/15 170 lb (77.1 kg)  12/10/14 165 lb (74.8 kg)  Psych/Mental status: Alert, oriented x 3 (person, place, & time) Eyes: PERLA Respiratory: No evidence of acute respiratory distress  Cervical Spine Exam  Inspection: No masses, redness, or swelling Alignment: Symmetrical Functional ROM: Unrestricted ROM Stability: No  instability detected Muscle strength & Tone: Functionally intact Sensory: Unimpaired Palpation: Non-contributory  Upper Extremity (UE) Exam    Side: Right upper extremity  Side: Left upper extremity  Inspection: No masses, redness, swelling, or asymmetry  Inspection: No masses, redness, swelling, or asymmetry  Functional ROM: Unrestricted ROM          Functional ROM: Unrestricted ROM          Muscle strength & Tone: Functionally  intact  Muscle strength & Tone: Functionally intact  Sensory: Unimpaired  Sensory: Unimpaired  Palpation: Non-contributory  Palpation: Non-contributory   Thoracic Spine Exam  Inspection: No masses, redness, or swelling Alignment: Symmetrical Functional ROM: Unrestricted ROM Stability: No instability detected Sensory: Unimpaired Muscle strength & Tone: Functionally intact Palpation: Non-contributory  Lumbar Spine Exam  Inspection: No masses, redness, or swelling Alignment: Symmetrical Functional ROM: Unrestricted ROM Stability: No instability detected Muscle strength & Tone: Functionally intact Sensory: Unimpaired Palpation: Non-contributory Provocative Tests: Lumbar Hyperextension and rotation test: evaluation deferred today       Patrick's Maneuver: evaluation deferred today              Gait & Posture Assessment  Ambulation: Unassisted Gait: Relatively normal for age and body habitus Posture: WNL   Lower Extremity Exam    Side: Right lower extremity  Side: Left lower extremity  Inspection: No masses, redness, swelling, or asymmetry  Inspection: No masses, redness, swelling, or asymmetry  Functional ROM: Unrestricted ROM          Functional ROM: Unrestricted ROM          Muscle strength & Tone: Functionally intact  Muscle strength & Tone: Functionally intact  Sensory: Unimpaired  Sensory: Unimpaired  Palpation: Non-contributory  Palpation: Non-contributory   Assessment & Plan  Primary Diagnosis & Pertinent Problem List: The primary encounter  diagnosis was Chronic pain syndrome. Diagnoses of Chronic shoulder pain (Location of Primary Source of Pain) (Bilateral) (L>R), Chronic hip pain (Location of Secondary source of pain) (Bilateral) (L>R), Chronic sacroiliac joint pain (Location of Tertiary source of pain) (Bilateral) (L>R), Failed back surgical syndrome, Fibromyalgia, Long term current use of opiate analgesic, Opiate use (140 MME/Day), and Chronic lumbar radicular pain (Left) were also pertinent to this visit.  Visit Diagnosis: 1. Chronic pain syndrome   2. Chronic shoulder pain (Location of Primary Source of Pain) (Bilateral) (L>R)   3. Chronic hip pain (Location of Secondary source of pain) (Bilateral) (L>R)   4. Chronic sacroiliac joint pain (Location of Tertiary source of pain) (Bilateral) (L>R)   5. Failed back surgical syndrome   6. Fibromyalgia   7. Long term current use of opiate analgesic   8. Opiate use (140 MME/Day)   9. Chronic lumbar radicular pain (Left)    Problems updated and reviewed during this visit: Problem  Chronic lumbar radicular pain (Left)  Opiate use (80-140 MME/Day)   Problem-specific Plan(s): No problem-specific Assessment & Plan notes found for this encounter.  Assessment & plan notes cannot be loaded without a specified hospital service.  Plan of Care  Pharmacotherapy (Medications Ordered): Meds ordered this encounter  Medications  . naloxone (NARCAN) 2 MG/2ML injection    Sig: Inject content of syringe into thigh muscle. Call 911.    Dispense:  2 Syringe    Refill:  1    NDC # [id number]. Please teach proper use of device.  . traMADol (ULTRAM) 50 MG tablet    Sig: Take 1 tablet (50 mg total) by mouth 4 (four) times daily.    Dispense:  120 tablet    Refill:  0    Patient may have prescription filled one day early if pharmacy is closed on scheduled refill date. Do not fill until: 05/18/16 To last until: 06/17/16  . tiZANidine (ZANAFLEX) 4 MG tablet    Sig: Take 2 tablets (8 mg  total) by mouth every 8 (eight) hours as needed for muscle spasms.    Dispense:  180 tablet  Refill:  0    Do not add this medication to the electronic "Automatic Refill" notification system. Patient may have prescription filled one day early if pharmacy is closed on scheduled refill date.  . fentaNYL (DURAGESIC) 25 MCG/HR patch    Sig: Place 1 patch (25 mcg total) onto the skin every 3 (three) days.    Dispense:  10 patch    Refill:  0    Patient may have prescription filled one day early if pharmacy is closed on scheduled refill date. Do not fill until: 05/18/16 To last until: 06/17/16  . predniSONE (DELTASONE) 50 MG tablet    Sig: Take 1 tab PO 13 hrs before procedure, 1 tab 7 hrs before procedure, and 1 tab 1 hr before procedure.    Dispense:  3 tablet    Refill:  0    Contrast allergy premedication.  . diphenhydrAMINE (BENADRYL) 50 MG capsule    Sig: Take 1 cap PO 1 hr prior to procedure.    Dispense:  30 capsule    Refill:  0    Contrast allergy prophylactic premedication.   Lab-work, procedure(s), and/or referral(s): Orders Placed This Encounter  Procedures  . Caudal Epidural Injection    Pharmacotherapy: Opioid Analgesics: We'll take over management today. See above orders Membrane stabilizer: We have discussed the possibility of optimizing this mode of therapy, if tolerated Muscle relaxant: We have discussed the possibility of a trial NSAID: We have discussed the possibility of a trial Other analgesic(s): To be determined at a later time   Interventional therapies: Planned, scheduled, and/or pending:    Caudal epidural steroid injection + epidurogram (iodine allergy premedication ordered)    Considering:   Diagnostic intra-articular shoulder joint injection  Diagnostic suprascapular nerve block  Possible bilateral suprascapular nerve radiofrequency ablation  Diagnostic bilateral intra-articular hip joint injection  Diagnostic bilateral femoral nerve and obturator  nerve articular branch block  Possible bilateral femoral nerve and obturator nerve articular branch radiofrequency ablation for the hip joint pain  Diagnostic intra-articular SI joint block  Possible bilateral sacroiliac joint radiofrequency ablation  Diagnostic caudal epidural steroid injection + epidurogram  Possible Racz procedure  Diagnostic bilateral intra-articular knee joint injection  Diagnostic bilateral genicular nerve blocks  Possible bilateral genicular radiofrequency ablation    PRN Procedures:   To be determined at a later time   Provider-requested follow-up: Return in about 1 month (around 05/19/2016) for (MD) Med-Mgmt, in addition, procedure (ASAA).  Future Appointments Date Time Provider [place]  06/05/2016 7:45 AM Milinda Pointer, MD [facility] None    Primary Care Physician: Frederich Cha, MD Location: [facility] Note by: Kathlen Brunswick. Dossie Arbour, M.D, DABA, DABAPM, DABPM, DABIPP, FIPP Date: 04/18/16; Time: 10:47 AM  Pain Score Disclaimer: We use the NRS-11 scale. This is a self-reported, subjective measurement of pain severity with only modest accuracy. It is used primarily to identify changes within a particular patient. It must be understood that outpatient pain scales are significantly less accurate that those used for research, where they can be applied under ideal controlled circumstances with minimal exposure to variables. In reality, the score is likely to be a combination of pain intensity and pain affect, where pain affect describes the degree of emotional arousal or changes in action readiness caused by the sensory experience of pain. Factors such as social and work situation, setting, emotional state, anxiety levels, expectation, and prior pain experience may influence pain perception and show large inter-individual differences that may also be affected  by time variables.  Patient instructions provided during this  appointment: Patient Instructions   GENERAL RISKS AND COMPLICATIONS  What are the risk, side effects and possible complications? Generally speaking, most procedures are safe.  However, with any procedure there are risks, side effects, and the possibility of complications.  The risks and complications are dependent upon the sites that are lesioned, or the type of nerve block to be performed.  The closer the procedure is to the spine, the more serious the risks are.  Great care is taken when placing the radio frequency needles, block needles or lesioning probes, but sometimes complications can occur. 1. Infection: Any time there is an injection through the skin, there is a risk of infection.  This is why sterile conditions are used for these blocks.  There are four possible types of infection. 1. Localized skin infection. 2. Central Nervous System Infection-This can be in the form of Meningitis, which can be deadly. 3. Epidural Infections-This can be in the form of an epidural abscess, which can cause pressure inside of the spine, causing compression of the spinal cord with subsequent paralysis. This would require an emergency surgery to decompress, and there are no guarantees that the patient would recover from the paralysis. 4. Discitis-This is an infection of the intervertebral discs.  It occurs in about 1% of discography procedures.  It is difficult to treat and it may lead to surgery.        2. Pain: the needles have to go through skin and soft tissues, will cause soreness.       3. Damage to internal structures:  The nerves to be lesioned may be near blood vessels or    other nerves which can be potentially damaged.       4. Bleeding: Bleeding is more common if the patient is taking blood thinners such as  aspirin, Coumadin, Ticiid, Plavix, etc., or if he/she have some genetic predisposition  such as hemophilia. Bleeding into the spinal canal can cause compression of the spinal  cord with  subsequent paralysis.  This would require an emergency surgery to  decompress and there are no guarantees that the patient would recover from the  paralysis.       5. Pneumothorax:  Puncturing of a lung is a possibility, every time a needle is introduced in  the area of the chest or upper back.  Pneumothorax refers to free air around the  collapsed lung(s), inside of the thoracic cavity (chest cavity).  Another two possible  complications related to a similar event would include: Hemothorax and Chylothorax.   These are variations of the Pneumothorax, where instead of air around the collapsed  lung(s), you may have blood or chyle, respectively.       6. Spinal headaches: They may occur with any procedures in the area of the spine.       7. Persistent CSF (Cerebro-Spinal Fluid) leakage: This is a rare problem, but may occur  with prolonged intrathecal or epidural catheters either due to the formation of a fistulous  track or a dural tear.       8. Nerve damage: By working so close to the spinal cord, there is always a possibility of  nerve damage, which could be as serious as a permanent spinal cord injury with  paralysis.       9. Death:  Although rare, severe deadly allergic reactions known as "Anaphylactic  reaction" can occur to any of the medications used.  10. Worsening of the symptoms:  We can always make thing worse.  What are the chances of something like this happening? Chances of any of this occuring are extremely low.  By statistics, you have more of a chance of getting killed in a motor vehicle accident: while driving to the hospital than any of the above occurring .  Nevertheless, you should be aware that they are possibilities.  In general, it is similar to taking a shower.  Everybody knows that you can slip, hit your head and get killed.  Does that mean that you should not shower again?  Nevertheless always keep in mind that statistics do not mean anything if you happen to be on the wrong  side of them.  Even if a procedure has a 1 (one) in a 1,000,000 (million) chance of going wrong, it you happen to be that one..Also, keep in mind that by statistics, you have more of a chance of having something go wrong when taking medications.  Who should not have this procedure? If you are on a blood thinning medication (e.g. Coumadin, Plavix, see list of "Blood Thinners"), or if you have an active infection going on, you should not have the procedure.  If you are taking any blood thinners, please inform your physician.  How should I prepare for this procedure?  Do not eat or drink anything at least six hours prior to the procedure.  Bring a driver with you .  It cannot be a taxi.  Come accompanied by an adult that can drive you back, and that is strong enough to help you if your legs get weak or numb from the local anesthetic.  Take all of your medicines the morning of the procedure with just enough water to swallow them.  If you have diabetes, make sure that you are scheduled to have your procedure done first thing in the morning, whenever possible.  If you have diabetes, take only half of your insulin dose and notify our nurse that you have done so as soon as you arrive at the clinic.  If you are diabetic, but only take blood sugar pills (oral hypoglycemic), then do not take them on the morning of your procedure.  You may take them after you have had the procedure.  Do not take aspirin or any aspirin-containing medications, at least eleven (11) days prior to the procedure.  They may prolong bleeding.  Wear loose fitting clothing that may be easy to take off and that you would not mind if it got stained with Betadine or blood.  Do not wear any jewelry or perfume  Remove any nail coloring.  It will interfere with some of our monitoring equipment.  NOTE: Remember that this is not meant to be interpreted as a complete list of all possible complications.  Unforeseen problems may  occur.  BLOOD THINNERS The following drugs contain aspirin or other products, which can cause increased bleeding during surgery and should not be taken for 2 weeks prior to and 1 week after surgery.  If you should need take something for relief of minor pain, you may take acetaminophen which is found in Tylenol,m Datril, Anacin-3 and Panadol. It is not blood thinner. The products listed below are.  Do not take any of the products listed below in addition to any listed on your instruction sheet.  A.P.C or A.P.C with Codeine Codeine Phosphate Capsules #3 Ibuprofen Ridaura  ABC compound Congesprin Imuran rimadil  Advil Cope Indocin Robaxisal  Alka-Seltzer Effervescent Pain Reliever and Antacid Coricidin or Coricidin-D  Indomethacin Rufen  Alka-Seltzer plus Cold Medicine Cosprin Ketoprofen S-A-C Tablets  Anacin Analgesic Tablets or Capsules Coumadin Korlgesic Salflex  Anacin Extra Strength Analgesic tablets or capsules CP-2 Tablets Lanoril Salicylate  Anaprox Cuprimine Capsules Levenox Salocol  Anexsia-D Dalteparin Magan Salsalate  Anodynos Darvon compound Magnesium Salicylate Sine-off  Ansaid Dasin Capsules Magsal Sodium Salicylate  Anturane Depen Capsules Marnal Soma  APF Arthritis pain formula Dewitt's Pills Measurin Stanback  Argesic Dia-Gesic Meclofenamic Sulfinpyrazone  Arthritis Bayer Timed Release Aspirin Diclofenac Meclomen Sulindac  Arthritis pain formula Anacin Dicumarol Medipren Supac  Analgesic (Safety coated) Arthralgen Diffunasal Mefanamic Suprofen  Arthritis Strength Bufferin Dihydrocodeine Mepro Compound Suprol  Arthropan liquid Dopirydamole Methcarbomol with Aspirin Synalgos  ASA tablets/Enseals Disalcid Micrainin Tagament  Ascriptin Doan's Midol Talwin  Ascriptin A/D Dolene Mobidin Tanderil  Ascriptin Extra Strength Dolobid Moblgesic Ticlid  Ascriptin with Codeine Doloprin or Doloprin with Codeine Momentum Tolectin  Asperbuf Duoprin Mono-gesic Trendar  Aspergum Duradyne  Motrin or Motrin IB Triminicin  Aspirin plain, buffered or enteric coated Durasal Myochrisine Trigesic  Aspirin Suppositories Easprin Nalfon Trillsate  Aspirin with Codeine Ecotrin Regular or Extra Strength Naprosyn Uracel  Atromid-S Efficin Naproxen Ursinus  Auranofin Capsules Elmiron Neocylate Vanquish  Axotal Emagrin Norgesic Verin  Azathioprine Empirin or Empirin with Codeine Normiflo Vitamin E  Azolid Emprazil Nuprin Voltaren  Bayer Aspirin plain, buffered or children's or timed BC Tablets or powders Encaprin Orgaran Warfarin Sodium  Buff-a-Comp Enoxaparin Orudis Zorpin  Buff-a-Comp with Codeine Equegesic Os-Cal-Gesic   Buffaprin Excedrin plain, buffered or Extra Strength Oxalid   Bufferin Arthritis Strength Feldene Oxphenbutazone   Bufferin plain or Extra Strength Feldene Capsules Oxycodone with Aspirin   Bufferin with Codeine Fenoprofen Fenoprofen Pabalate or Pabalate-SF   Buffets II Flogesic Panagesic   Buffinol plain or Extra Strength Florinal or Florinal with Codeine Panwarfarin   Buf-Tabs Flurbiprofen Penicillamine   Butalbital Compound Four-way cold tablets Penicillin   Butazolidin Fragmin Pepto-Bismol   Carbenicillin Geminisyn Percodan   Carna Arthritis Reliever Geopen Persantine   Carprofen Gold's salt Persistin   Chloramphenicol Goody's Phenylbutazone   Chloromycetin Haltrain Piroxlcam   Clmetidine heparin Plaquenil   Cllnoril Hyco-pap Ponstel   Clofibrate Hydroxy chloroquine Propoxyphen         Before stopping any of these medications, be sure to consult the physician who ordered them.  Some, such as Coumadin (Warfarin) are ordered to prevent or treat serious conditions such as "deep thrombosis", "pumonary embolisms", and other heart problems.  The amount of time that you may need off of the medication may also vary with the medication and the reason for which you were taking it.  If you are taking any of these medications, please make sure you notify your pain  physician before you undergo any procedures.         Epidural Steroid Injection Patient Information  Description: The epidural space surrounds the nerves as they exit the spinal cord.  In some patients, the nerves can be compressed and inflamed by a bulging disc or a tight spinal canal (spinal stenosis).  By injecting steroids into the epidural space, we can bring irritated nerves into direct contact with a potentially helpful medication.  These steroids act directly on the irritated nerves and can reduce swelling and inflammation which often leads to decreased pain.  Epidural steroids may be injected anywhere along the spine and from the neck to the low back depending upon the location of your pain.   After  numbing the skin with local anesthetic (like Novocaine), a small needle is passed into the epidural space slowly.  You may experience a sensation of pressure while this is being done.  The entire block usually last less than 10 minutes.  Conditions which may be treated by epidural steroids:   Low back and leg pain  Neck and arm pain  Spinal stenosis  Post-laminectomy syndrome  Herpes zoster (shingles) pain  Pain from compression fractures  Preparation for the injection:  1. Do not eat any solid food or dairy products within 8 hours of your appointment.  2. You may drink clear liquids up to 3 hours before appointment.  Clear liquids include water, black coffee, juice or soda.  No milk or cream please. 3. You may take your regular medication, including pain medications, with a sip of water before your appointment  Diabetics should hold regular insulin (if taken separately) and take 1/2 normal NPH dos the morning of the procedure.  Carry some sugar containing items with you to your appointment. 4. A driver must accompany you and be prepared to drive you home after your procedure.  5. Bring all your current medications with your. 6. An IV may be inserted and sedation may be given  at the discretion of the physician.   7. A blood pressure cuff, EKG and other monitors will often be applied during the procedure.  Some patients may need to have extra oxygen administered for a short period. 8. You will be asked to provide medical information, including your allergies, prior to the procedure.  We must know immediately if you are taking blood thinners (like Coumadin/Warfarin)  Or if you are allergic to IV iodine contrast (dye). We must know if you could possible be pregnant.  Possible side-effects:  Bleeding from needle site  Infection (rare, may require surgery)  Nerve injury (rare)  Numbness & tingling (temporary)  Difficulty urinating (rare, temporary)  Spinal headache ( a headache worse with upright posture)  Light -headedness (temporary)  Pain at injection site (several days)  Decreased blood pressure (temporary)  Weakness in arm/leg (temporary)  Pressure sensation in back/neck (temporary)  Call if you experience:  Fever/chills associated with headache or increased back/neck pain.  Headache worsened by an upright position.  New onset weakness or numbness of an extremity below the injection site  Hives or difficulty breathing (go to the emergency room)  Inflammation or drainage at the infection site  Severe back/neck pain  Any new symptoms which are concerning to you  Please note:  Although the local anesthetic injected can often make your back or neck feel good for several hours after the injection, the pain will likely return.  It takes 3-7 days for steroids to work in the epidural space.  You may not notice any pain relief for at least that one week.  If effective, we will often do a series of three injections spaced 3-6 weeks apart to maximally decrease your pain.  After the initial series, we generally will wait several months before considering a repeat injection of the same type.  If you have any questions, please call [phone] [facility]

## 2016-04-18 NOTE — Patient Instructions (Signed)
GENERAL RISKS AND COMPLICATIONS  What are the risk, side effects and possible complications? Generally speaking, most procedures are safe.  However, with any procedure there are risks, side effects, and the possibility of complications.  The risks and complications are dependent upon the sites that are lesioned, or the type of nerve block to be performed.  The closer the procedure is to the spine, the more serious the risks are.  Great care is taken when placing the radio frequency needles, block needles or lesioning probes, but sometimes complications can occur. 1. Infection: Any time there is an injection through the skin, there is a risk of infection.  This is why sterile conditions are used for these blocks.  There are four possible types of infection. 1. Localized skin infection. 2. Central Nervous System Infection-This can be in the form of Meningitis, which can be deadly. 3. Epidural Infections-This can be in the form of an epidural abscess, which can cause pressure inside of the spine, causing compression of the spinal cord with subsequent paralysis. This would require an emergency surgery to decompress, and there are no guarantees that the patient would recover from the paralysis. 4. Discitis-This is an infection of the intervertebral discs.  It occurs in about 1% of discography procedures.  It is difficult to treat and it may lead to surgery.        2. Pain: the needles have to go through skin and soft tissues, will cause soreness.       3. Damage to internal structures:  The nerves to be lesioned may be near blood vessels or    other nerves which can be potentially damaged.       4. Bleeding: Bleeding is more common if the patient is taking blood thinners such as  aspirin, Coumadin, Ticiid, Plavix, etc., or if he/she have some genetic predisposition  such as hemophilia. Bleeding into the spinal canal can cause compression of the spinal  cord with subsequent paralysis.  This would require an  emergency surgery to  decompress and there are no guarantees that the patient would recover from the  paralysis.       5. Pneumothorax:  Puncturing of a lung is a possibility, every time a needle is introduced in  the area of the chest or upper back.  Pneumothorax refers to free air around the  collapsed lung(s), inside of the thoracic cavity (chest cavity).  Another two possible  complications related to a similar event would include: Hemothorax and Chylothorax.   These are variations of the Pneumothorax, where instead of air around the collapsed  lung(s), you may have blood or chyle, respectively.       6. Spinal headaches: They may occur with any procedures in the area of the spine.       7. Persistent CSF (Cerebro-Spinal Fluid) leakage: This is a rare problem, but may occur  with prolonged intrathecal or epidural catheters either due to the formation of a fistulous  track or a dural tear.       8. Nerve damage: By working so close to the spinal cord, there is always a possibility of  nerve damage, which could be as serious as a permanent spinal cord injury with  paralysis.       9. Death:  Although rare, severe deadly allergic reactions known as "Anaphylactic  reaction" can occur to any of the medications used.      10. Worsening of the symptoms:  We can always make thing worse.    What are the chances of something like this happening? Chances of any of this occuring are extremely low.  By statistics, you have more of a chance of getting killed in a motor vehicle accident: while driving to the hospital than any of the above occurring .  Nevertheless, you should be aware that they are possibilities.  In general, it is similar to taking a shower.  Everybody knows that you can slip, hit your head and get killed.  Does that mean that you should not shower again?  Nevertheless always keep in mind that statistics do not mean anything if you happen to be on the wrong side of them.  Even if a procedure has a 1  (one) in a 1,000,000 (million) chance of going wrong, it you happen to be that one..Also, keep in mind that by statistics, you have more of a chance of having something go wrong when taking medications.  Who should not have this procedure? If you are on a blood thinning medication (e.g. Coumadin, Plavix, see list of "Blood Thinners"), or if you have an active infection going on, you should not have the procedure.  If you are taking any blood thinners, please inform your physician.  How should I prepare for this procedure?  Do not eat or drink anything at least six hours prior to the procedure.  Bring a driver with you .  It cannot be a taxi.  Come accompanied by an adult that can drive you back, and that is strong enough to help you if your legs get weak or numb from the local anesthetic.  Take all of your medicines the morning of the procedure with just enough water to swallow them.  If you have diabetes, make sure that you are scheduled to have your procedure done first thing in the morning, whenever possible.  If you have diabetes, take only half of your insulin dose and notify our nurse that you have done so as soon as you arrive at the clinic.  If you are diabetic, but only take blood sugar pills (oral hypoglycemic), then do not take them on the morning of your procedure.  You may take them after you have had the procedure.  Do not take aspirin or any aspirin-containing medications, at least eleven (11) days prior to the procedure.  They may prolong bleeding.  Wear loose fitting clothing that may be easy to take off and that you would not mind if it got stained with Betadine or blood.  Do not wear any jewelry or perfume  Remove any nail coloring.  It will interfere with some of our monitoring equipment.  NOTE: Remember that this is not meant to be interpreted as a complete list of all possible complications.  Unforeseen problems may occur.  BLOOD THINNERS The following drugs  contain aspirin or other products, which can cause increased bleeding during surgery and should not be taken for 2 weeks prior to and 1 week after surgery.  If you should need take something for relief of minor pain, you may take acetaminophen which is found in Tylenol,m Datril, Anacin-3 and Panadol. It is not blood thinner. The products listed below are.  Do not take any of the products listed below in addition to any listed on your instruction sheet.  A.P.C or A.P.C with Codeine Codeine Phosphate Capsules #3 Ibuprofen Ridaura  ABC compound Congesprin Imuran rimadil  Advil Cope Indocin Robaxisal  Alka-Seltzer Effervescent Pain Reliever and Antacid Coricidin or Coricidin-D  Indomethacin Rufen    Alka-Seltzer plus Cold Medicine Cosprin Ketoprofen S-A-C Tablets  Anacin Analgesic Tablets or Capsules Coumadin Korlgesic Salflex  Anacin Extra Strength Analgesic tablets or capsules CP-2 Tablets Lanoril Salicylate  Anaprox Cuprimine Capsules Levenox Salocol  Anexsia-D Dalteparin Magan Salsalate  Anodynos Darvon compound Magnesium Salicylate Sine-off  Ansaid Dasin Capsules Magsal Sodium Salicylate  Anturane Depen Capsules Marnal Soma  APF Arthritis pain formula Dewitt's Pills Measurin Stanback  Argesic Dia-Gesic Meclofenamic Sulfinpyrazone  Arthritis Bayer Timed Release Aspirin Diclofenac Meclomen Sulindac  Arthritis pain formula Anacin Dicumarol Medipren Supac  Analgesic (Safety coated) Arthralgen Diffunasal Mefanamic Suprofen  Arthritis Strength Bufferin Dihydrocodeine Mepro Compound Suprol  Arthropan liquid Dopirydamole Methcarbomol with Aspirin Synalgos  ASA tablets/Enseals Disalcid Micrainin Tagament  Ascriptin Doan's Midol Talwin  Ascriptin A/D Dolene Mobidin Tanderil  Ascriptin Extra Strength Dolobid Moblgesic Ticlid  Ascriptin with Codeine Doloprin or Doloprin with Codeine Momentum Tolectin  Asperbuf Duoprin Mono-gesic Trendar  Aspergum Duradyne Motrin or Motrin IB Triminicin  Aspirin  plain, buffered or enteric coated Durasal Myochrisine Trigesic  Aspirin Suppositories Easprin Nalfon Trillsate  Aspirin with Codeine Ecotrin Regular or Extra Strength Naprosyn Uracel  Atromid-S Efficin Naproxen Ursinus  Auranofin Capsules Elmiron Neocylate Vanquish  Axotal Emagrin Norgesic Verin  Azathioprine Empirin or Empirin with Codeine Normiflo Vitamin E  Azolid Emprazil Nuprin Voltaren  Bayer Aspirin plain, buffered or children's or timed BC Tablets or powders Encaprin Orgaran Warfarin Sodium  Buff-a-Comp Enoxaparin Orudis Zorpin  Buff-a-Comp with Codeine Equegesic Os-Cal-Gesic   Buffaprin Excedrin plain, buffered or Extra Strength Oxalid   Bufferin Arthritis Strength Feldene Oxphenbutazone   Bufferin plain or Extra Strength Feldene Capsules Oxycodone with Aspirin   Bufferin with Codeine Fenoprofen Fenoprofen Pabalate or Pabalate-SF   Buffets II Flogesic Panagesic   Buffinol plain or Extra Strength Florinal or Florinal with Codeine Panwarfarin   Buf-Tabs Flurbiprofen Penicillamine   Butalbital Compound Four-way cold tablets Penicillin   Butazolidin Fragmin Pepto-Bismol   Carbenicillin Geminisyn Percodan   Carna Arthritis Reliever Geopen Persantine   Carprofen Gold's salt Persistin   Chloramphenicol Goody's Phenylbutazone   Chloromycetin Haltrain Piroxlcam   Clmetidine heparin Plaquenil   Cllnoril Hyco-pap Ponstel   Clofibrate Hydroxy chloroquine Propoxyphen         Before stopping any of these medications, be sure to consult the physician who ordered them.  Some, such as Coumadin (Warfarin) are ordered to prevent or treat serious conditions such as "deep thrombosis", "pumonary embolisms", and other heart problems.  The amount of time that you may need off of the medication may also vary with the medication and the reason for which you were taking it.  If you are taking any of these medications, please make sure you notify your pain physician before you undergo any  procedures.         Epidural Steroid Injection Patient Information  Description: The epidural space surrounds the nerves as they exit the spinal cord.  In some patients, the nerves can be compressed and inflamed by a bulging disc or a tight spinal canal (spinal stenosis).  By injecting steroids into the epidural space, we can bring irritated nerves into direct contact with a potentially helpful medication.  These steroids act directly on the irritated nerves and can reduce swelling and inflammation which often leads to decreased pain.  Epidural steroids may be injected anywhere along the spine and from the neck to the low back depending upon the location of your pain.   After numbing the skin with local anesthetic (like Novocaine), a small needle is passed   into the epidural space slowly.  You may experience a sensation of pressure while this is being done.  The entire block usually last less than 10 minutes.  Conditions which may be treated by epidural steroids:   Low back and leg pain  Neck and arm pain  Spinal stenosis  Post-laminectomy syndrome  Herpes zoster (shingles) pain  Pain from compression fractures  Preparation for the injection:  1. Do not eat any solid food or dairy products within 8 hours of your appointment.  2. You may drink clear liquids up to 3 hours before appointment.  Clear liquids include water, black coffee, juice or soda.  No milk or cream please. 3. You may take your regular medication, including pain medications, with a sip of water before your appointment  Diabetics should hold regular insulin (if taken separately) and take 1/2 normal NPH dos the morning of the procedure.  Carry some sugar containing items with you to your appointment. 4. A driver must accompany you and be prepared to drive you home after your procedure.  5. Bring all your current medications with your. 6. An IV may be inserted and sedation may be given at the discretion of the  physician.   7. A blood pressure cuff, EKG and other monitors will often be applied during the procedure.  Some patients may need to have extra oxygen administered for a short period. 8. You will be asked to provide medical information, including your allergies, prior to the procedure.  We must know immediately if you are taking blood thinners (like Coumadin/Warfarin)  Or if you are allergic to IV iodine contrast (dye). We must know if you could possible be pregnant.  Possible side-effects:  Bleeding from needle site  Infection (rare, may require surgery)  Nerve injury (rare)  Numbness & tingling (temporary)  Difficulty urinating (rare, temporary)  Spinal headache ( a headache worse with upright posture)  Light -headedness (temporary)  Pain at injection site (several days)  Decreased blood pressure (temporary)  Weakness in arm/leg (temporary)  Pressure sensation in back/neck (temporary)  Call if you experience:  Fever/chills associated with headache or increased back/neck pain.  Headache worsened by an upright position.  New onset weakness or numbness of an extremity below the injection site  Hives or difficulty breathing (go to the emergency room)  Inflammation or drainage at the infection site  Severe back/neck pain  Any new symptoms which are concerning to you  Please note:  Although the local anesthetic injected can often make your back or neck feel good for several hours after the injection, the pain will likely return.  It takes 3-7 days for steroids to work in the epidural space.  You may not notice any pain relief for at least that one week.  If effective, we will often do a series of three injections spaced 3-6 weeks apart to maximally decrease your pain.  After the initial series, we generally will wait several months before considering a repeat injection of the same type.  If you have any questions, please call [phone] [facility] 

## 2016-04-18 NOTE — Progress Notes (Signed)
Safety precautions to be maintained throughout the outpatient stay will include: orient to surroundings, keep bed in low position, maintain call bell within reach at all times, provide assistance with transfer out of bed and ambulation.  

## 2016-04-19 DIAGNOSIS — F331 Major depressive disorder, recurrent, moderate: Secondary | ICD-10-CM | POA: Diagnosis not present

## 2016-04-20 ENCOUNTER — Ambulatory Visit: Payer: Medicare Other | Admitting: Pain Medicine

## 2016-05-15 ENCOUNTER — Encounter: Payer: Self-pay | Admitting: Emergency Medicine

## 2016-05-15 ENCOUNTER — Emergency Department: Payer: Medicare Other

## 2016-05-15 ENCOUNTER — Emergency Department
Admission: EM | Admit: 2016-05-15 | Discharge: 2016-05-15 | Disposition: A | Discharge status: Home / Self Care | Payer: Medicare Other | Attending: Emergency Medicine | Admitting: Emergency Medicine

## 2016-05-15 DIAGNOSIS — S52612A Displaced fracture of left ulna styloid process, initial encounter for closed fracture: Secondary | ICD-10-CM | POA: Diagnosis not present

## 2016-05-15 DIAGNOSIS — S62102A Fracture of unspecified carpal bone, left wrist, initial encounter for closed fracture: Secondary | ICD-10-CM

## 2016-05-15 DIAGNOSIS — S52592A Other fractures of lower end of left radius, initial encounter for closed fracture: Secondary | ICD-10-CM | POA: Diagnosis not present

## 2016-05-15 DIAGNOSIS — J45909 Unspecified asthma, uncomplicated: Secondary | ICD-10-CM | POA: Insufficient documentation

## 2016-05-15 DIAGNOSIS — Y939 Activity, unspecified: Secondary | ICD-10-CM | POA: Diagnosis not present

## 2016-05-15 DIAGNOSIS — Y929 Unspecified place or not applicable: Secondary | ICD-10-CM | POA: Diagnosis not present

## 2016-05-15 DIAGNOSIS — E162 Hypoglycemia, unspecified: Secondary | ICD-10-CM | POA: Diagnosis not present

## 2016-05-15 DIAGNOSIS — S52615A Nondisplaced fracture of left ulna styloid process, initial encounter for closed fracture: Secondary | ICD-10-CM | POA: Diagnosis not present

## 2016-05-15 DIAGNOSIS — Y999 Unspecified external cause status: Secondary | ICD-10-CM | POA: Insufficient documentation

## 2016-05-15 DIAGNOSIS — W010XXA Fall on same level from slipping, tripping and stumbling without subsequent striking against object, initial encounter: Secondary | ICD-10-CM | POA: Insufficient documentation

## 2016-05-15 DIAGNOSIS — S6992XA Unspecified injury of left wrist, hand and finger(s), initial encounter: Secondary | ICD-10-CM | POA: Diagnosis present

## 2016-05-15 DIAGNOSIS — I10 Essential (primary) hypertension: Secondary | ICD-10-CM | POA: Insufficient documentation

## 2016-05-15 DIAGNOSIS — F17210 Nicotine dependence, cigarettes, uncomplicated: Secondary | ICD-10-CM | POA: Diagnosis not present

## 2016-05-15 DIAGNOSIS — S52502A Unspecified fracture of the lower end of left radius, initial encounter for closed fracture: Secondary | ICD-10-CM | POA: Diagnosis not present

## 2016-05-15 MED ORDER — METHOCARBAMOL 750 MG PO TABS: 750.0000 mg | ORAL_TABLET | Freq: Four times a day (QID) | ORAL | 0 refills | Status: DC

## 2016-05-15 NOTE — ED Triage Notes (Signed)
[address] and caught self with L hand, pain L wrist

## 2016-05-15 NOTE — ED Provider Notes (Signed)
[facility] Emergency Department Provider Note   ____________________________________________   First MD Initiated Contact with Patient 05/15/16 1554     (approximate)  I have reviewed the triage vital signs and the nursing notes.   HISTORY  Chief Complaint Wrist Pain    HPI April Price is a 61 y.o. female patient complaining of left wrist pain secondary to a fall. Patient she tripped and fell breaking her fall with the left hand. Patient state there is pain and swelling but no obvious deformity. Patient stated pain increases with flexion or extension of the wrist. Patient points to the distal radius as a source of pain. Patient applied ice prior to arrival. Patient rates the pain as a 6/10. Patient describes the pain as "achy". Patient is left-hand dominant.Patient states she has adequate pain medication at home.   Past Medical History:  Diagnosis Date  . Allergy   . Arthritis   . Asthma   . Chronic fatigue   . Depression   . Depression screen   . Diffuse cystic mastopathy   . Fibromyalgia   . Fibromyalgia muscle pain   . GERD (gastroesophageal reflux disease)   . Hypertension   . Neuromuscular disorder [facility])     Patient Active Problem List   Diagnosis Date Noted  . Chronic lumbar radicular pain (Left) 04/18/2016  . Vitamin D deficiency 03/07/2016  . Musculoskeletal pain 02/11/2016  . Neurogenic pain 02/11/2016  . Chronic pain syndrome 02/10/2016  . Long term current use of opiate analgesic 02/10/2016  . Long term prescription opiate use 02/10/2016  . Opiate use (80-140 MME/Day) 02/10/2016  . Chronic shoulder pain (Location of Primary Source of Pain) (Bilateral) (L>R) 02/10/2016  . Chronic hip pain (Location of Secondary source of pain) (Bilateral) (L>R) 02/10/2016  . Chronic sacroiliac joint pain (Location of Tertiary source of pain) (Bilateral) (L>R) 02/10/2016  . Chronic low back pain (Bilateral) (L>R) 02/10/2016  . Failed  back surgical syndrome 02/10/2016  . History of lumbar fusion 02/10/2016  . Chronic lower extremity pain (Bilateral) (L>R) 02/10/2016  . Chronic knee pain (Bilateral) (L>R) 02/10/2016  . Chronic elbow pain (Bilateral) (L>R) 02/10/2016  . Chronic hand pain (Bilateral) (L>R) 02/10/2016  . History of fibrocystic disease of breast 01/08/2013  . Dry eyes, bilateral 02/06/2012  . Hypoglycemia 02/06/2012  . Hypertension 02/06/2012  . Chronic fatigue syndrome 02/06/2012  . Osteopenia 02/06/2012  . Vaginal atrophy 02/06/2012  . Spasm of back muscles 01/31/2011  . Chronic neck pain 01/31/2011  . Depression 01/13/2011  . History of seasonal allergies 01/13/2011  . Situational disturbance 01/13/2011  . Elevated blood pressure 01/13/2011  . Fibromyalgia 01/12/2011    Past Surgical History:  Procedure Laterality Date  . ABDOMINAL HYSTERECTOMY    . BACK SURGERY    . BREAST BIOPSY Right   . cataract    . CHOLECYSTECTOMY    . COLONOSCOPY  2006  . EYE SURGERY Bilateral 2011   cataract  . NASAL SINUS SURGERY    . NASAL SINUS SURGERY    . [place]  2003, 2012  . TYMPANOSTOMY TUBE PLACEMENT      Prior to Admission medications   Medication Sig Start Date End Date Taking? Authorizing Provider  albuterol (PROVENTIL HFA;VENTOLIN HFA) 108 (90 Base) MCG/ACT inhaler Inhale 2 puffs into the lungs every 4 (four) hours as needed for wheezing or shortness of breath. 11/25/15   Frederich Cha, MD  Cholecalciferol (VITAMIN D3) 2000 units capsule Take 1 capsule (2,000 Units  total) by mouth daily. 03/07/16   Milinda Pointer, MD  diphenhydrAMINE (BENADRYL) 50 MG capsule Take 1 cap PO 1 hr prior to procedure. 04/18/16   Milinda Pointer, MD  EPINEPHrine (EPIPEN 2-PAK) 0.3 mg/0.3 mL IJ SOAJ injection Inject 0.3 mLs (0.3 mg total) into the muscle once. 06/10/15   Frederich Cha, MD  esomeprazole (NEXIUM) 40 MG capsule Take 1 capsule (40 mg total) by mouth daily. 11/25/15   Frederich Cha, MD  fentaNYL (DURAGESIC) 25  MCG/HR patch To be filled between 05/12/2006 seen in 06/09/2016 11/25/15   Frederich Cha, MD  fentaNYL (DURAGESIC) 25 MCG/HR patch Place 1 patch (25 mcg total) onto the skin every 3 (three) days. 05/18/16 06/17/16  Milinda Pointer, MD  ibuprofen (ADVIL,MOTRIN) 800 MG tablet Take 1 tablet (800 mg total) by mouth every 8 (eight) hours as needed for pain. 03/12/12   Frederich Cha, MD  methocarbamol (ROBAXIN-750) 750 MG tablet Take 1 tablet (750 mg total) by mouth 4 (four) times daily. 05/15/16   Sable Feil, PA-C  naloxone [facility]) 2 MG/2ML injection Inject content of syringe into thigh muscle. Call 911. 04/18/16   Milinda Pointer, MD  predniSONE (DELTASONE) 50 MG tablet Take 1 tab PO 13 hrs before procedure, 1 tab 7 hrs before procedure, and 1 tab 1 hr before procedure. 04/18/16   Milinda Pointer, MD  ranitidine (ZANTAC) 150 MG tablet Take 1 tablet (150 mg total) by mouth 2 (two) times daily. 11/25/15   Frederich Cha, MD  theophylline (THEO-24) 300 MG 24 hr capsule Take 1 capsule (300 mg total) by mouth daily. 11/25/15   Frederich Cha, MD  tiZANidine (ZANAFLEX) 4 MG tablet Take 2 tablets (8 mg total) by mouth every 8 (eight) hours as needed for muscle spasms. 06/07/16 07/07/16  Milinda Pointer, MD  traMADol (ULTRAM) 50 MG tablet Take 1 tablet (50 mg total) by mouth 4 (four) times daily. 05/18/16 06/17/16  Milinda Pointer, MD  Vitamin D, Ergocalciferol, (DRISDOL) 50000 units CAPS capsule Take 1 capsule (50,000 Units total) by mouth 2 (two) times a week. X 6 weeks. 03/07/16   Milinda Pointer, MD    Allergies Morphine and related; Penicillins; Shrimp [shellfish allergy]; Tape; Tetanus toxoids; and Tylenol [acetaminophen]  Family History  Problem Relation Age of Onset  . Heart disease Mother   . Heart disease Father   . Vascular Disease Sister   . Heart disease Brother     Social History Social History  Substance Use Topics  . Smoking status: Current Every Day Smoker    Packs/day: 0.50    Years: 30.00     Types: Cigarettes  . Smokeless tobacco: Never Used  . Alcohol use No    Review of Systems Constitutional: No fever/chills Eyes: No visual changes. ENT: No sore throat. Cardiovascular: Denies chest pain. Respiratory: Denies shortness of breath. Gastrointestinal: No abdominal pain.  No nausea, no vomiting.  No diarrhea.  No constipation. Genitourinary: Negative for dysuria. Musculoskeletal: Fibromyalgia  Skin: Negative for rash. Neurological: Negative for headaches, focal weakness or numbness. Psychiatric:Depression Endocrine:Hypertension and hypoglycemia. Allergic/Immunilogical: See medication list  ____________________________________________   PHYSICAL EXAM:  VITAL SIGNS: ED Triage Vitals [05/15/16 1505]  Enc Vitals Group     BP (!) 176/83     Pulse Rate 89     Resp 18     Temp 97.7 F (36.5 C)     Temp Source Oral     SpO2 100 %     Weight 170 lb (77.1 kg)  Height 5\' 4"  (1.626 m)     Head Circumference      Peak Flow      Pain Score 6     Pain Loc      Pain Edu?      Excl. in [place]?     Constitutional: Alert and oriented. Well appearing and in no acute distress. Eyes: Conjunctivae are normal. PERRL. EOMI. Head: Atraumatic. Nose: No congestion/rhinnorhea. Mouth/Throat: Mucous membranes are moist.  Oropharynx non-erythematous. Neck: No stridor.  No cervical spine tenderness to palpation. Hematological/Lymphatic/Immunilogical: No cervical lymphadenopathy. Cardiovascular: Normal rate, regular rhythm. Grossly normal heart sounds.  Good peripheral circulation. Elevated blood pressure. Respiratory: Normal respiratory effort.  No retractions. Lungs CTAB. Gastrointestinal: Soft and nontender. No distention. No abdominal bruits. No CVA tenderness. Musculoskeletal: No lower extremity tenderness nor edema.  No joint effusions. Neurologic:  Normal speech and language. No gross focal neurologic deficits are appreciated. No gait instability. Skin:  Skin is warm, dry and  intact. No rash noted. Psychiatric: Mood and affect are normal. Speech and behavior are normal.  ____________________________________________   LABS (all labs ordered are listed, but only abnormal results are displayed)  Labs Reviewed - No data to display ____________________________________________  EKG   ____________________________________________  RADIOLOGY  Distal radial fracture. ____________________________________________   PROCEDURES  Procedure(s) performed: None  Procedures  Critical Care performed: No  ____________________________________________   INITIAL IMPRESSION / ASSESSMENT AND PLAN / ED COURSE  Pertinent labs & imaging results that were available during my care of the patient were reviewed by me and considered in my medical decision making (see chart for details).  Distal radial fracture. Discussed x-ray finding with patient. Patient placed in the volar splint and will follow orthopedics by calling for an appointment in the morning. Patient states she has adequate pain medication at home.      ____________________________________________   FINAL CLINICAL IMPRESSION(S) / ED DIAGNOSES  Final diagnoses:  Closed fracture of left wrist, initial encounter      NEW MEDICATIONS STARTED DURING THIS VISIT:  New Prescriptions   METHOCARBAMOL (ROBAXIN-750) 750 MG TABLET    Take 1 tablet (750 mg total) by mouth 4 (four) times daily.     Note:  This document was prepared using Dragon voice recognition software and may include unintentional dictation errors.    Sable Feil, PA-C 05/15/16 [place], MD 05/16/16 1125

## 2016-05-15 NOTE — ED Notes (Signed)
See triage note  States she fell landed on left wrist  Positive swelling with questionable deformity noted

## 2016-05-15 NOTE — Discharge Instructions (Signed)
Wear splint and sling until evaluation by orthopedic Dr. °

## 2016-05-16 DIAGNOSIS — F331 Major depressive disorder, recurrent, moderate: Secondary | ICD-10-CM | POA: Diagnosis not present

## 2016-05-16 DIAGNOSIS — S52552G Other extraarticular fracture of lower end of left radius, subsequent encounter for closed fracture with delayed healing: Secondary | ICD-10-CM | POA: Diagnosis not present

## 2016-05-23 DIAGNOSIS — S52552G Other extraarticular fracture of lower end of left radius, subsequent encounter for closed fracture with delayed healing: Secondary | ICD-10-CM | POA: Diagnosis not present

## 2016-05-30 ENCOUNTER — Other Ambulatory Visit: Payer: Self-pay | Admitting: Specialist

## 2016-05-30 ENCOUNTER — Telehealth: Payer: Self-pay

## 2016-05-30 DIAGNOSIS — S52552G Other extraarticular fracture of lower end of left radius, subsequent encounter for closed fracture with delayed healing: Secondary | ICD-10-CM | POA: Diagnosis not present

## 2016-05-30 DIAGNOSIS — F331 Major depressive disorder, recurrent, moderate: Secondary | ICD-10-CM | POA: Diagnosis not present

## 2016-05-30 NOTE — Telephone Encounter (Signed)
Patient is going to have surgery for wrist fx and needs medical clearance.  Stated to her that we would not provide clearance for anything other than the fact that the surgeon can treat acute pain as he see's fit after surgery.  Dr Marciano Sequin is going to perform her surgery.  Patients' fax number [phone].  Will fax information on Post-op pain management on a chronic pain management patient after having Dr Dossie Arbour sign.  Patient verbalizes u/o information.

## 2016-05-30 NOTE — Telephone Encounter (Signed)
Pt is going to have surgery on her wrist Monday pt says she needs Dr Lowella Dandy to complete a medical clearance

## 2016-06-01 ENCOUNTER — Encounter
Admission: RE | Admit: 2016-06-01 | Discharge: 2016-06-01 | Disposition: A | Discharge status: Home / Self Care | Payer: Medicare Other | Source: Ambulatory Visit | Attending: Specialist | Admitting: Specialist

## 2016-06-01 ENCOUNTER — Other Ambulatory Visit: Payer: Self-pay | Admitting: Specialist

## 2016-06-01 DIAGNOSIS — Z01810 Encounter for preprocedural cardiovascular examination: Secondary | ICD-10-CM | POA: Diagnosis not present

## 2016-06-01 DIAGNOSIS — Z01812 Encounter for preprocedural laboratory examination: Secondary | ICD-10-CM | POA: Insufficient documentation

## 2016-06-01 DIAGNOSIS — I10 Essential (primary) hypertension: Secondary | ICD-10-CM | POA: Insufficient documentation

## 2016-06-01 HISTORY — DX: Anxiety disorder, unspecified: F41.9

## 2016-06-01 HISTORY — DX: Personal history of urinary calculi: Z87.442

## 2016-06-01 HISTORY — DX: Cardiac murmur, unspecified: R01.1

## 2016-06-01 LAB — DIFFERENTIAL
Basophils Absolute: 0.1 10*3/uL (ref 0–0.1)
Basophils Relative: 1 %
Eosinophils Absolute: 0.1 10*3/uL (ref 0–0.7)
Eosinophils Relative: 1 %
Lymphocytes Relative: 20 %
Lymphs Abs: 1.6 10*3/uL (ref 1.0–3.6)
Monocytes Absolute: 0.6 10*3/uL (ref 0.2–0.9)
Monocytes Relative: 7 %
Neutro Abs: 5.8 10*3/uL (ref 1.4–6.5)
Neutrophils Relative %: 71 %

## 2016-06-01 LAB — CBC
HCT: 52.3 % — ABNORMAL HIGH (ref 35.0–47.0)
Hemoglobin: 18.2 g/dL — ABNORMAL HIGH (ref 12.0–16.0)
MCH: 32.5 pg (ref 26.0–34.0)
MCHC: 34.8 g/dL (ref 32.0–36.0)
MCV: 93.6 fL (ref 80.0–100.0)
Platelets: 207 10*3/uL (ref 150–440)
RBC: 5.59 MIL/uL — ABNORMAL HIGH (ref 3.80–5.20)
RDW: 14.2 % (ref 11.5–14.5)
WBC: 8.2 10*3/uL (ref 3.6–11.0)

## 2016-06-01 NOTE — Patient Instructions (Signed)
  Your procedure is scheduled on: June 05, 2016 (Monday) Report to Same Day Surgery 2nd floor medical mall [facility] Entrance-take elevator on left to 2nd floor.  Check in with surgery information desk.) To find out your arrival time please call [phone] between 1PM - 3PM on June 02, 2016 (Friday)  Remember: Instructions that are not followed completely may result in serious medical risk, up to and including death, or upon the discretion of your surgeon and anesthesiologist your surgery may need to be rescheduled.    _x___ 1. Do not eat food or drink liquids after midnight. No gum chewing or hard candies.     __x__ 2. No Alcohol for 24 hours before or after surgery.   __x__3. No Smoking for 24 prior to surgery.   ____  4. Bring all medications with you on the day of surgery if instructed.    __x__ 5. Notify your doctor if there is any change in your medical condition     (cold, fever, infections).     Do not wear jewelry, make-up, hairpins, clips or nail polish.  Do not wear lotions, powders, or perfumes. You may wear deodorant.  Do not shave 48 hours prior to surgery. Men may shave face and neck.  Do not bring valuables to the hospital.    [facility] is not responsible for any belongings or valuables.               Contacts, dentures or bridgework may not be worn into surgery.  Leave your suitcase in the car. After surgery it may be brought to your room.  For patients admitted to the hospital, discharge time is determined by your treatment team.   Patients discharged the day of surgery will not be allowed to drive home.  You will need someone to drive you home and stay with you the night of your procedure.    Please read over the following fact sheets that you were given:   [facility] for [facility] and or MRSA Information   _x___ Take these medicines the morning of surgery with A SIP OF WATER:    1. [place]  2.  3.  4.  5.  6.  ____Fleets enema or  Magnesium Citrate as directed.   ___ Use CHG Soap or sage wipes as directed on instruction sheet   __x__ Use inhalers on the day of surgery and bring to hospital day of surgery (USE ALBUTEROL [place])  ____ Stop metformin 2 days prior to surgery    ____ Take 1/2 of usual insulin dose the night before surgery and none on the morning of           surgery.   _x___ Stop Aspirin, Coumadin, Pllavix ,Eliquis, Effient, or Pradaxa (NO ASPIRIN)  x__ Stop Anti-inflammatories such as Advil, Aleve, Ibuprofen, Motrin, Naproxen,          Naprosyn, Goodies powders or aspirin products.   ____ Stop supplements until after surgery.    ____ Bring C-Pap to the hospital.

## 2016-06-04 MED ORDER — CLINDAMYCIN PHOSPHATE 600 MG/50ML IV SOLN
600.0000 mg | Freq: Once | INTRAVENOUS | Status: AC
  Administered 2016-06-05: 600 mg via INTRAVENOUS

## 2016-06-05 ENCOUNTER — Ambulatory Visit: Payer: Medicare Other | Admitting: Anesthesiology

## 2016-06-05 ENCOUNTER — Encounter: Payer: Self-pay | Admitting: *Deleted

## 2016-06-05 ENCOUNTER — Ambulatory Visit
Admission: RE | Admit: 2016-06-05 | Discharge: 2016-06-05 | Disposition: A | Discharge status: Home / Self Care | Payer: Medicare Other | Source: Ambulatory Visit | Attending: Specialist | Admitting: Specialist

## 2016-06-05 ENCOUNTER — Ambulatory Visit: Payer: Medicare Other | Admitting: Pain Medicine

## 2016-06-05 ENCOUNTER — Encounter
Admission: RE | Disposition: A | Discharge status: Home / Self Care | Payer: Self-pay | Source: Ambulatory Visit | Attending: Specialist

## 2016-06-05 DIAGNOSIS — J45909 Unspecified asthma, uncomplicated: Secondary | ICD-10-CM | POA: Insufficient documentation

## 2016-06-05 DIAGNOSIS — F17200 Nicotine dependence, unspecified, uncomplicated: Secondary | ICD-10-CM | POA: Diagnosis not present

## 2016-06-05 DIAGNOSIS — K219 Gastro-esophageal reflux disease without esophagitis: Secondary | ICD-10-CM | POA: Insufficient documentation

## 2016-06-05 DIAGNOSIS — S52552G Other extraarticular fracture of lower end of left radius, subsequent encounter for closed fracture with delayed healing: Secondary | ICD-10-CM | POA: Diagnosis not present

## 2016-06-05 DIAGNOSIS — I10 Essential (primary) hypertension: Secondary | ICD-10-CM | POA: Insufficient documentation

## 2016-06-05 DIAGNOSIS — S52532A Colles' fracture of left radius, initial encounter for closed fracture: Secondary | ICD-10-CM | POA: Diagnosis not present

## 2016-06-05 DIAGNOSIS — Z79899 Other long term (current) drug therapy: Secondary | ICD-10-CM | POA: Insufficient documentation

## 2016-06-05 DIAGNOSIS — M797 Fibromyalgia: Secondary | ICD-10-CM | POA: Insufficient documentation

## 2016-06-05 DIAGNOSIS — S52552A Other extraarticular fracture of lower end of left radius, initial encounter for closed fracture: Secondary | ICD-10-CM | POA: Insufficient documentation

## 2016-06-05 DIAGNOSIS — F418 Other specified anxiety disorders: Secondary | ICD-10-CM | POA: Diagnosis not present

## 2016-06-05 DIAGNOSIS — X58XXXA Exposure to other specified factors, initial encounter: Secondary | ICD-10-CM | POA: Insufficient documentation

## 2016-06-05 HISTORY — PX: OPEN REDUCTION INTERNAL FIXATION (ORIF) DISTAL RADIAL FRACTURE: SHX5989

## 2016-06-05 SURGERY — OPEN REDUCTION INTERNAL FIXATION (ORIF) DISTAL RADIUS FRACTURE
Anesthesia: General | Site: Wrist | Laterality: Left | Wound class: Clean

## 2016-06-05 MED ORDER — LABETALOL HCL 5 MG/ML IV SOLN
INTRAVENOUS | Status: DC | PRN
  Administered 2016-06-05: 5 mg via INTRAVENOUS

## 2016-06-05 MED ORDER — FENTANYL CITRATE (PF) 100 MCG/2ML IJ SOLN
INTRAMUSCULAR | Status: DC | PRN
  Administered 2016-06-05 (×3): 25 ug via INTRAVENOUS
  Administered 2016-06-05 (×2): 50 ug via INTRAVENOUS
  Administered 2016-06-05: 75 ug via INTRAVENOUS

## 2016-06-05 MED ORDER — LACTATED RINGERS IV SOLN
INTRAVENOUS | Status: DC
  Administered 2016-06-05: 12:00:00 via INTRAVENOUS

## 2016-06-05 MED ORDER — GABAPENTIN 400 MG PO CAPS
400.0000 mg | ORAL_CAPSULE | Freq: Two times a day (BID) | ORAL | 3 refills | Status: DC
Start: 2016-06-05 — End: 2016-06-15

## 2016-06-05 MED ORDER — MELOXICAM 7.5 MG PO TABS
15.0000 mg | ORAL_TABLET | Freq: Once | ORAL | Status: AC
  Administered 2016-06-05: 15 mg via ORAL

## 2016-06-05 MED ORDER — CHLORHEXIDINE GLUCONATE CLOTH 2 % EX PADS: 6.0000 | MEDICATED_PAD | Freq: Once | CUTANEOUS | Status: DC

## 2016-06-05 MED ORDER — PROPOFOL 10 MG/ML IV BOLUS
INTRAVENOUS | Status: DC | PRN
  Administered 2016-06-05: 120 mg via INTRAVENOUS

## 2016-06-05 MED ORDER — MIDAZOLAM HCL 2 MG/2ML IJ SOLN
INTRAMUSCULAR | Status: AC
  Filled 2016-06-05: qty 2

## 2016-06-05 MED ORDER — FENTANYL CITRATE (PF) 250 MCG/5ML IJ SOLN
INTRAMUSCULAR | Status: AC
  Filled 2016-06-05: qty 5

## 2016-06-05 MED ORDER — FENTANYL CITRATE (PF) 100 MCG/2ML IJ SOLN
25.0000 ug | INTRAMUSCULAR | Status: DC | PRN
  Administered 2016-06-05 (×2): 50 ug via INTRAVENOUS

## 2016-06-05 MED ORDER — PHENYLEPHRINE HCL 10 MG/ML IJ SOLN
INTRAMUSCULAR | Status: DC | PRN
  Administered 2016-06-05: 100 ug via INTRAVENOUS

## 2016-06-05 MED ORDER — ONDANSETRON HCL 4 MG/2ML IJ SOLN: 4.0000 mg | Freq: Once | INTRAMUSCULAR | Status: DC | PRN

## 2016-06-05 MED ORDER — DEXAMETHASONE SODIUM PHOSPHATE 10 MG/ML IJ SOLN
INTRAMUSCULAR | Status: DC | PRN
  Administered 2016-06-05: 10 mg via INTRAVENOUS

## 2016-06-05 MED ORDER — BUPIVACAINE HCL (PF) 0.5 % IJ SOLN
INTRAMUSCULAR | Status: AC
  Filled 2016-06-05: qty 30

## 2016-06-05 MED ORDER — LIDOCAINE HCL (CARDIAC) 20 MG/ML IV SOLN
INTRAVENOUS | Status: DC | PRN
  Administered 2016-06-05: 80 mg via INTRAVENOUS

## 2016-06-05 MED ORDER — KETAMINE HCL 10 MG/ML IJ SOLN
INTRAMUSCULAR | Status: AC
  Filled 2016-06-05: qty 1

## 2016-06-05 MED ORDER — BUPIVACAINE HCL (PF) 0.5 % IJ SOLN
INTRAMUSCULAR | Status: DC | PRN
Start: 2016-06-05 — End: 2016-06-05
  Administered 2016-06-05: 25 mL

## 2016-06-05 MED ORDER — GABAPENTIN 400 MG PO CAPS
ORAL_CAPSULE | ORAL | Status: AC
  Filled 2016-06-05: qty 1

## 2016-06-05 MED ORDER — MELOXICAM 7.5 MG PO TABS
ORAL_TABLET | ORAL | Status: AC
  Filled 2016-06-05: qty 2

## 2016-06-05 MED ORDER — OXYCODONE HCL 5 MG PO TABS: 5.0000 mg | ORAL_TABLET | Freq: Four times a day (QID) | ORAL | 0 refills | Status: DC | PRN

## 2016-06-05 MED ORDER — PROPOFOL 10 MG/ML IV BOLUS
INTRAVENOUS | Status: AC
  Filled 2016-06-05: qty 20

## 2016-06-05 MED ORDER — FENTANYL CITRATE (PF) 100 MCG/2ML IJ SOLN
INTRAMUSCULAR | Status: AC
  Filled 2016-06-05: qty 2

## 2016-06-05 MED ORDER — CLINDAMYCIN PHOSPHATE 600 MG/50ML IV SOLN
INTRAVENOUS | Status: AC
  Filled 2016-06-05: qty 50

## 2016-06-05 MED ORDER — GABAPENTIN 400 MG PO CAPS
400.0000 mg | ORAL_CAPSULE | Freq: Once | ORAL | Status: AC
  Administered 2016-06-05: 400 mg via ORAL

## 2016-06-05 MED ORDER — GLYCOPYRROLATE 0.2 MG/ML IJ SOLN
INTRAMUSCULAR | Status: DC | PRN
  Administered 2016-06-05: 0.2 mg via INTRAVENOUS

## 2016-06-05 MED ORDER — HYDROMORPHONE HCL 1 MG/ML IJ SOLN
INTRAMUSCULAR | Status: DC | PRN
  Administered 2016-06-05 (×2): 0.5 mg via INTRAVENOUS

## 2016-06-05 MED ORDER — HYDROMORPHONE HCL 1 MG/ML IJ SOLN
INTRAMUSCULAR | Status: AC
  Filled 2016-06-05: qty 1

## 2016-06-05 MED ORDER — MIDAZOLAM HCL 2 MG/2ML IJ SOLN
INTRAMUSCULAR | Status: DC | PRN
  Administered 2016-06-05: 2 mg via INTRAVENOUS

## 2016-06-05 MED ORDER — NEOMYCIN-POLYMYXIN B GU 40-200000 IR SOLN
Status: DC | PRN
  Administered 2016-06-05: 2 mL

## 2016-06-05 MED ORDER — KETAMINE HCL 10 MG/ML IJ SOLN
INTRAMUSCULAR | Status: DC | PRN
  Administered 2016-06-05: 40 mg via INTRAVENOUS

## 2016-06-05 MED ORDER — ONDANSETRON HCL 4 MG/2ML IJ SOLN
INTRAMUSCULAR | Status: DC | PRN
  Administered 2016-06-05: 4 mg via INTRAVENOUS

## 2016-06-05 SURGICAL SUPPLY — 41 items
BIT DRILL 2 FAST STEP (BIT) ×2 IMPLANT
BIT DRILL 2.5X4 QC (BIT) ×2 IMPLANT
BLADE SURG MINI STRL (BLADE) ×3 IMPLANT
BNDG COHESIVE 4X5 TAN STRL (GAUZE/BANDAGES/DRESSINGS) IMPLANT
BNDG ESMARK 4X12 TAN STRL LF (GAUZE/BANDAGES/DRESSINGS) ×3 IMPLANT
CANISTER SUCT 1200ML W/VALVE (MISCELLANEOUS) ×3 IMPLANT
CHLORAPREP W/TINT 26ML (MISCELLANEOUS) ×3 IMPLANT
CUFF TOURN 18 STER (MISCELLANEOUS) IMPLANT
DRAPE FLUOR MINI C-ARM 54X84 (DRAPES) ×3 IMPLANT
ELECT REM PT RETURN 9FT ADLT (ELECTROSURGICAL) ×3
ELECTRODE REM PT RTRN 9FT ADLT (ELECTROSURGICAL) ×1 IMPLANT
GAUZE FLUFF 18X24 1PLY STRL (GAUZE/BANDAGES/DRESSINGS) ×3 IMPLANT
GAUZE PETRO XEROFOAM 1X8 (MISCELLANEOUS) ×3 IMPLANT
GAUZE SPONGE 4X4 12PLY STRL (GAUZE/BANDAGES/DRESSINGS) ×3 IMPLANT
GLOVE INDICATOR 8.0 STRL GRN (GLOVE) ×3 IMPLANT
GLOVE SURG ORTHO 8.5 STRL (GLOVE) ×3 IMPLANT
GOWN STRL REUS W/ TWL LRG LVL3 (GOWN DISPOSABLE) ×1 IMPLANT
GOWN STRL REUS W/TWL LRG LVL3 (GOWN DISPOSABLE) ×3
GOWN STRL REUS W/TWL LRG LVL4 (GOWN DISPOSABLE) ×3 IMPLANT
KIT RM TURNOVER STRD PROC AR (KITS) ×3 IMPLANT
NDL SAFETY 18GX1.5 (NEEDLE) ×3 IMPLANT
NS IRRIG 500ML POUR BTL (IV SOLUTION) ×3 IMPLANT
PACK EXTREMITY ARMC (MISCELLANEOUS) ×3 IMPLANT
PADDING CAST 4IN STRL (MISCELLANEOUS) ×4
PADDING CAST BLEND 4X4 STRL (MISCELLANEOUS) ×2 IMPLANT
PEG SUBCHONDRAL SMOOTH 2.0X18 (Peg) ×4 IMPLANT
PLATE SHORT 21.6X48.9 NRRW LT (Plate) ×2 IMPLANT
SCREW BN 12X3.5XNS CORT TI (Screw) IMPLANT
SCREW CORT 3.5X12 (Screw) ×6 IMPLANT
SCREW CORT 3.5X14 LNG (Screw) ×2 IMPLANT
SCREW PEG LOCK 2.5X14 (Peg) ×2 IMPLANT
SCREW PEG LOCK 2.5X18 (Peg) ×2 IMPLANT
SCREW PEG LOCK 2.5X20 (Peg) ×2 IMPLANT
SPLINT CAST 1 STEP 3X12 (MISCELLANEOUS) ×3 IMPLANT
SPONGE LAP 18X18 5 PK (GAUZE/BANDAGES/DRESSINGS) ×3 IMPLANT
STAPLER SKIN PROX 35W (STAPLE) ×3 IMPLANT
STOCKINETTE 48X4 2 PLY STRL (GAUZE/BANDAGES/DRESSINGS) ×1 IMPLANT
STOCKINETTE BIAS CUT 4 980044 (GAUZE/BANDAGES/DRESSINGS) ×3 IMPLANT
STOCKINETTE STRL 4IN 9604848 (GAUZE/BANDAGES/DRESSINGS) ×3 IMPLANT
SUT VIC AB 3-0 SH 27 (SUTURE) ×3
SUT VIC AB 3-0 SH 27X BRD (SUTURE) ×1 IMPLANT

## 2016-06-05 NOTE — Anesthesia Procedure Notes (Signed)
Procedure Name: LMA Insertion Date/Time: 06/05/2016 1:02 PM Performed by: Alda Berthold Pre-anesthesia Checklist: Patient identified, Patient being monitored, Timeout performed, Emergency Drugs available and Suction available Patient Re-evaluated:Patient Re-evaluated prior to inductionOxygen Delivery Method: Circle system utilized Preoxygenation: Pre-oxygenation with 100% oxygen Intubation Type: IV induction Ventilation: Mask ventilation without difficulty LMA: LMA inserted LMA Size: 4.0 Tube type: Oral Number of attempts: 1 Placement Confirmation: positive ETCO2 and breath sounds checked- equal and bilateral Tube secured with: Tape Dental Injury: Teeth and Oropharynx as per pre-operative assessment

## 2016-06-05 NOTE — Op Note (Signed)
06/05/2016  2:23 PM  PATIENT:  April Price    PRE-OPERATIVE DIAGNOSIS:  S52.552G Oth extrartic fx low end l rad, 7thG  POST-OPERATIVE DIAGNOSIS:  Same  PROCEDURE:  OPEN REDUCTION INTERNAL FIXATION (ORIF)  LEFT DISTAL RADIAL FRACTURE  SURGEON:  Park Breed, MD  TOURNIQUET TIME:  17  MIN  ANESTHESIA:   General  PREOPERATIVE INDICATIONS:  April Price is a  61 y.o. female with a diagnosis of S52.552G Oth extrartic fx low end l rad, 7thG who failed conservative measures and elected for surgical management.    The risks benefits and alternatives were discussed with the patient preoperatively including but not limited to the risks of infection, bleeding, nerve injury, malunion, nonunion, wrist stiffness, persistent wrist pain, osteoarthritis and the need for further surgery. Medical risks include but are not limited to DVT and pulmonary embolism, myocardial infarction, stroke, pneumonia, respiratory failure and death. Patient  understood these risks and wished to proceed.   OPERATIVE IMPLANTS: [employer] hand innovations plate, 4 hole  OPERATIVE FINDINGS: Displaced elft distal radius  OPERATIVE PROCEDURE: Patient was seen in the preoperative area. I marked the operative hand with the word yes and my initials according the hospital's correct site of surgery protocol. Patient was then brought to the operating roomand was placed supine on the operative table and underwent general anesthesia with an LMA.   The operative arm was prepped and draped in a sterile fashion. A timeout performed to verify the patient's name, date of birth, medical record number, correct site of surgery correct procedure to be performed. The timeout was also used a timeout to verify patient received antibiotics and appropriate instruments, implants and radiographs studies were available in the room. Once all in attendance were in agreement case began.   Patient then had the operative extremity  exsanguinated with an Esmarch. The tourniquet was placed on the upper extremity and inflated 250 mm.  A manual reduction of the fracture was performed. The fracture reduction was confirmed on FluoroScan imaging.  A linear incision was then made over the FCR tendon. The subcutaneous tissue was carefully dissected using Metzenbaum scissor and Adson pickup. Retractors were used to protect the radial artery and median nerve. The pronator quadratus was identified and incised and elevated off the volar surface of the distal radius. A 3  hole Hand Innovations volar plate was then positioned on the under surface of the distal radius. It was held into position with a K wire. The position of the plate was confirmed on AP and lateral images. Once the plate was in good position a cortical screw was placed bicortically in the sliding hole. Attention was then turned to the distal pegs. The proximal row of pegs was placed first. Each individual peg hole was drilled and then measured with a depth gauge. The proximal row had 3 threaded pegs placed. The distal row was then drilled and smooth pegs were placed. The position and length of all screws were confirmed on AP and lateral FluoroScan imaging. Care was taken to avoid penetration of any peg through the articular surface of the distal radius.  Once all distal pegs were placed, the attention was turned back to placement of bicortical shaft screws. Additional screws were placed in the plate to fill the remaining holes. The wound was then copiously irrigated. Final FluoroScan imaging of the construct were taken. The fracture was in anatomic position and the hardware was well-positioned. The wound again was copiously irrigated. The soft tissue was then carefully  over the plate. The tissues were infiltrated with 1/2% marcaine.  The skin was closed with staples. Xeroform and a dry sterile dressing were applied along with a volar splint. I was scrubbed and present for the entire case  and all sharp and instrument counts were correct at the conclusion the case. The patient tolerated this procedure well and was awakened and taken to the recovery room in good condition.   Earnestine Leys, MD

## 2016-06-05 NOTE — Transfer of Care (Signed)
Immediate Anesthesia Transfer of Care Note  Patient: April Price  Procedure(s) Performed: Procedure(s) with comments: OPEN REDUCTION INTERNAL FIXATION (ORIF) DISTAL RADIAL FRACTURE (Left) - Hand Innovations needed  Patient Location: PACU  Anesthesia Type:General  Level of Consciousness: awake, alert , oriented and patient cooperative  Airway & Oxygen Therapy: Patient Spontanous Breathing and Patient connected to nasal cannula oxygen  Post-op Assessment: Report given to RN, Post -op Vital signs reviewed and stable and Patient moving all extremities  Post vital signs: Reviewed and stable  Last Vitals:  Vitals:   06/05/16 1132 06/05/16 1426  BP: (!) 173/80 (!) 133/92  Pulse: 90 81  Resp: 18 13  Temp: 36.6 C 36.3 C    Last Pain:  Vitals:   06/05/16 1132  TempSrc: Oral  PainSc: 7          Complications: No apparent anesthesia complications

## 2016-06-05 NOTE — Discharge Instructions (Signed)
AMBULATORY SURGERY  DISCHARGE INSTRUCTIONS   1) The drugs that you were given will stay in your system until tomorrow so for the next 24 hours you should not:  A) Drive an automobile B) Make any legal decisions C) Drink any alcoholic beverage   2) You may resume regular meals tomorrow.  Today it is better to start with liquids and gradually work up to solid foods.  You may eat anything you prefer, but it is better to start with liquids, then soup and crackers, and gradually work up to solid foods.   3) Please notify your doctor immediately if you have any unusual bleeding, trouble breathing, redness and pain at the surgery site, drainage, fever, or pain not relieved by medication.    4) Additional Instructions:  Keep your left arm elevated above the level of your heart for the next few days.   Please contact your physician with any problems or Same Day Surgery at [phone], Monday through Friday 6 am to 4 pm, or [place] at [facility] number at [phone].                                                                                                                         PHYSICIAN ORDERS:  1)  Increase activity slowly.  2)  Call physicians office for temperature above 100.4; persistent nausea and vomiting;redness around incision area; signs and symptoms of infection; pain that will not go away with pain medication.  3) Follow up with Dr. Sabra Heck as previously scheduled on Friday, March 2nd at 4 pm.  Office number is [phone].

## 2016-06-05 NOTE — Anesthesia Preprocedure Evaluation (Signed)
Anesthesia Evaluation  Patient identified by MRN, date of birth, ID band Patient awake    Reviewed: Allergy & Precautions, H&P , NPO status , Patient's Chart, lab work & pertinent test results, reviewed documented beta blocker date and time   History of Anesthesia Complications Negative for: history of anesthetic complications  Airway Mallampati: I  TM Distance: >3 FB Neck ROM: full    Dental  (+) Edentulous Upper, Edentulous Lower, Poor Dentition   Pulmonary neg shortness of breath, asthma , neg sleep apnea, neg COPD, neg recent URI, Current Smoker,           Cardiovascular Exercise Tolerance: Good hypertension, (-) angina(-) CAD, (-) Past MI, (-) Cardiac Stents and (-) CABG (-) dysrhythmias + Valvular Problems/Murmurs      Neuro/Psych neg Seizures PSYCHIATRIC DISORDERS (Depression)  Neuromuscular disease (fibromyalgia)    GI/Hepatic Neg liver ROS, GERD  ,  Endo/Other  negative endocrine ROS  Renal/GU Renal disease (h/o kidney stones)  negative genitourinary   Musculoskeletal   Abdominal   Peds  Hematology negative hematology ROS (+)   Anesthesia Other Findings Past Medical History: No date: Allergy No date: Anxiety No date: Arthritis No date: Asthma No date: Chronic fatigue No date: Depression No date: Depression screen No date: Diffuse cystic mastopathy No date: Fibromyalgia No date: Fibromyalgia muscle pain No date: GERD (gastroesophageal reflux disease) No date: Heart murmur No date: History of kidney stones No date: Hypertension No date: Neuromuscular disorder ([place])   Reproductive/Obstetrics negative OB ROS                             Anesthesia Physical Anesthesia Plan  ASA: III  Anesthesia Plan: General   Post-op Pain Management:    Induction:   Airway Management Planned:   Additional Equipment:   Intra-op Plan:   Post-operative Plan:   Informed Consent: I  have reviewed the patients History and Physical, chart, labs and discussed the procedure including the risks, benefits and alternatives for the proposed anesthesia with the patient or authorized representative who has indicated his/her understanding and acceptance.   Dental Advisory Given  Plan Discussed with: Anesthesiologist, CRNA and Surgeon  Anesthesia Plan Comments:         Anesthesia Quick Evaluation

## 2016-06-05 NOTE — H&P (Signed)
THE PATIENT WAS SEEN PRIOR TO SURGERY TODAY.  HISTORY, ALLERGIES, HOME MEDICATIONS AND OPERATIVE PROCEDURE WERE REVIEWED. RISKS AND BENEFITS OF SURGERY DISCUSSED WITH PATIENT AGAIN.  NO CHANGES FROM INITIAL HISTORY AND PHYSICAL NOTED.    

## 2016-06-05 NOTE — Anesthesia Post-op Follow-up Note (Cosign Needed)
Anesthesia QCDR form completed.        

## 2016-06-06 ENCOUNTER — Encounter: Payer: Self-pay | Admitting: Specialist

## 2016-06-07 NOTE — Anesthesia Postprocedure Evaluation (Signed)
Anesthesia Post Note  Patient: Tamera Werth  Procedure(s) Performed: Procedure(s) (LRB): OPEN REDUCTION INTERNAL FIXATION (ORIF) DISTAL RADIAL FRACTURE (Left)  Patient location during evaluation: PACU Anesthesia Type: General Level of consciousness: awake and alert Pain management: pain level controlled Vital Signs Assessment: post-procedure vital signs reviewed and stable Respiratory status: spontaneous breathing, nonlabored ventilation, respiratory function stable and patient connected to nasal cannula oxygen Cardiovascular status: blood pressure returned to baseline and stable Postop Assessment: no signs of nausea or vomiting Anesthetic complications: no     Last Vitals:  Vitals:   06/05/16 1523 06/05/16 1603  BP: 138/75 138/71  Pulse: 72 69  Resp: 16 15  Temp: 36.7 C 36.6 C    Last Pain:  Vitals:   06/06/16 0832  TempSrc:   PainSc: 1                  Martha Clan

## 2016-06-08 DIAGNOSIS — S52552G Other extraarticular fracture of lower end of left radius, subsequent encounter for closed fracture with delayed healing: Secondary | ICD-10-CM | POA: Diagnosis not present

## 2016-06-15 ENCOUNTER — Encounter: Payer: Self-pay | Admitting: Pain Medicine

## 2016-06-15 ENCOUNTER — Ambulatory Visit: Payer: Medicare Other | Attending: Pain Medicine | Admitting: Pain Medicine

## 2016-06-15 VITALS — BP 140/74 | HR 86 | Temp 97.9°F | Resp 16 | Ht 64.0 in | Wt 170.0 lb

## 2016-06-15 DIAGNOSIS — R5382 Chronic fatigue, unspecified: Secondary | ICD-10-CM | POA: Insufficient documentation

## 2016-06-15 DIAGNOSIS — F419 Anxiety disorder, unspecified: Secondary | ICD-10-CM | POA: Insufficient documentation

## 2016-06-15 DIAGNOSIS — K219 Gastro-esophageal reflux disease without esophagitis: Secondary | ICD-10-CM | POA: Insufficient documentation

## 2016-06-15 DIAGNOSIS — M533 Sacrococcygeal disorders, not elsewhere classified: Secondary | ICD-10-CM | POA: Insufficient documentation

## 2016-06-15 DIAGNOSIS — F329 Major depressive disorder, single episode, unspecified: Secondary | ICD-10-CM | POA: Diagnosis not present

## 2016-06-15 DIAGNOSIS — F1190 Opioid use, unspecified, uncomplicated: Secondary | ICD-10-CM

## 2016-06-15 DIAGNOSIS — F17210 Nicotine dependence, cigarettes, uncomplicated: Secondary | ICD-10-CM | POA: Diagnosis not present

## 2016-06-15 DIAGNOSIS — I10 Essential (primary) hypertension: Secondary | ICD-10-CM | POA: Insufficient documentation

## 2016-06-15 DIAGNOSIS — M25552 Pain in left hip: Secondary | ICD-10-CM | POA: Diagnosis not present

## 2016-06-15 DIAGNOSIS — M25511 Pain in right shoulder: Secondary | ICD-10-CM | POA: Diagnosis not present

## 2016-06-15 DIAGNOSIS — G894 Chronic pain syndrome: Secondary | ICD-10-CM

## 2016-06-15 DIAGNOSIS — G8929 Other chronic pain: Secondary | ICD-10-CM | POA: Insufficient documentation

## 2016-06-15 DIAGNOSIS — Z79891 Long term (current) use of opiate analgesic: Secondary | ICD-10-CM | POA: Diagnosis not present

## 2016-06-15 DIAGNOSIS — M25512 Pain in left shoulder: Secondary | ICD-10-CM | POA: Insufficient documentation

## 2016-06-15 DIAGNOSIS — M797 Fibromyalgia: Secondary | ICD-10-CM | POA: Diagnosis not present

## 2016-06-15 DIAGNOSIS — M961 Postlaminectomy syndrome, not elsewhere classified: Secondary | ICD-10-CM

## 2016-06-15 DIAGNOSIS — M25559 Pain in unspecified hip: Secondary | ICD-10-CM | POA: Diagnosis not present

## 2016-06-15 DIAGNOSIS — M25551 Pain in right hip: Secondary | ICD-10-CM | POA: Diagnosis not present

## 2016-06-15 DIAGNOSIS — R52 Pain, unspecified: Secondary | ICD-10-CM | POA: Diagnosis present

## 2016-06-15 MED ORDER — FENTANYL 25 MCG/HR TD PT72: 25.0000 ug | MEDICATED_PATCH | TRANSDERMAL | 0 refills | Status: DC

## 2016-06-15 MED ORDER — PREDNISONE 50 MG PO TABS: ORAL_TABLET | ORAL | 0 refills | Status: DC

## 2016-06-15 MED ORDER — TRAMADOL HCL 50 MG PO TABS: 50.0000 mg | ORAL_TABLET | Freq: Four times a day (QID) | ORAL | 0 refills | Status: DC

## 2016-06-15 MED ORDER — TIZANIDINE HCL 4 MG PO TABS: 8.0000 mg | ORAL_TABLET | Freq: Three times a day (TID) | ORAL | 0 refills | Status: DC | PRN

## 2016-06-15 MED ORDER — DIPHENHYDRAMINE HCL 50 MG PO CAPS: ORAL_CAPSULE | ORAL | 0 refills | Status: DC

## 2016-06-15 NOTE — Progress Notes (Signed)
Patient fell approximately 1 month ago after losing balance in her kitchen while stooping to pick something up.  This required ORIF by Dr Earnestine Leys, see surgical history for further details.

## 2016-06-15 NOTE — Progress Notes (Signed)
Nursing Pain Medication Assessment:  Safety precautions to be maintained throughout the outpatient stay will include: orient to surroundings, keep bed in low position, maintain call bell within reach at all times, provide assistance with transfer out of bed and ambulation.  Medication Inspection Compliance: Pill count conducted under aseptic conditions, in front of the patient. Neither the pills nor the bottle was removed from the patient's sight at any time. Once count was completed pills were immediately returned to the patient in their original bottle.  Medication #1: Fentanyl patch Pill/Patch Count: 1 of 10 patches remain Bottle Appearance: Standard pharmacy container. Clearly labeled. Filled Date:02 /09 / 2018 Last Medication intake:  Day before yesterday  Medication #2: Tramadol (Ultram) Pill/Patch Count: 18 of 120 pills remain Bottle Appearance: Standard pharmacy container. Clearly labeled. Filled Date: 02 / 09 / 2018 Last Medication intake:  Today

## 2016-06-15 NOTE — Patient Instructions (Addendum)
Rx for fentanyl duragesic 25 mcg and tramadol 50 mg x 1 month to begin filling on 06/17/16 given to patient  Pain Score  Introduction: The pain score used by this practice is the Verbal Numerical Rating Scale (VNRS-11). This is an 11-point scale. It is for adults and children 10 years or older. There are significant differences in how the pain score is reported, used, and applied. Forget everything you learned in the past and learn this scoring system.  General Information: The scale should reflect your current level of pain. Unless you are specifically asked for the level of your worst pain, or your average pain. If you are asked for one of these two, then it should be understood that it is over the past 24 hours.  Basic Activities of Daily Living (ADL): Personal hygiene, dressing, eating, transferring, and using restroom.  Instructions: Most patients tend to report their level of pain as a combination of two factors, their physical pain and their psychosocial pain. This last one is also known as suffering and it is reflection of how physical pain affects you socially and psychologically. From now on, report them separately. From this point on, when asked to report your pain level, report only your physical pain. Use the following table for reference.  Pain Clinic Pain Levels (0-5/10)  Pain Level Score Description  No Pain 0   Mild pain 1 Nagging, annoying, but does not interfere with basic activities of daily living (ADL). Patients are able to eat, bathe, get dressed, toileting (being able to get on and off the toilet and perform personal hygiene functions), transfer (move in and out of bed or a chair without assistance), and maintain continence (able to control bladder and bowel functions). Blood pressure and heart rate are unaffected. A normal heart rate for a healthy adult ranges from 60 to 100 bpm (beats per minute).   Mild to moderate pain 2 Noticeable and distracting. Impossible to hide  from other people. More frequent flare-ups. Still possible to adapt and function close to normal. It can be very annoying and may have occasional stronger flare-ups. With discipline, patients may get used to it and adapt.   Moderate pain 3 Interferes significantly with activities of daily living (ADL). It becomes difficult to feed, bathe, get dressed, get on and off the toilet or to perform personal hygiene functions. Difficult to get in and out of bed or a chair without assistance. Very distracting. With effort, it can be ignored when deeply involved in activities.   Moderately severe pain 4 Impossible to ignore for more than a few minutes. With effort, patients may still be able to manage work or participate in some social activities. Very difficult to concentrate. Signs of autonomic nervous system discharge are evident: dilated pupils (mydriasis); mild sweating (diaphoresis); sleep interference. Heart rate becomes elevated (>115 bpm). Diastolic blood pressure (lower number) rises above 100 mmHg. Patients find relief in laying down and not moving.   Severe pain 5 Intense and extremely unpleasant. Associated with frowning face and frequent crying. Pain overwhelms the senses.  Ability to do any activity or maintain social relationships becomes significantly limited. Conversation becomes difficult. Pacing back and forth is common, as getting into a comfortable position is nearly impossible. Pain wakes you up from deep sleep. Physical signs will be obvious: pupillary dilation; increased sweating; goosebumps; brisk reflexes; cold, clammy hands and feet; nausea, vomiting or dry heaves; loss of appetite; significant sleep disturbance with inability to fall asleep or to remain asleep.  When persistent, significant weight loss is observed due to the complete loss of appetite and sleep deprivation.  Blood pressure and heart rate becomes significantly elevated. Caution: If elevated blood pressure triggers a pounding  headache associated with blurred vision, then the patient should immediately seek attention at an urgent or emergency care unit, as these may be signs of an impending stroke.    Emergency Department Pain Levels (6-10/10)  Emergency Room Pain 6 Severely limiting. Requires emergency care and should not be seen or managed at an outpatient pain management facility. Communication becomes difficult and requires great effort. Assistance to reach the emergency department may be required. Facial flushing and profuse sweating along with potentially dangerous increases in heart rate and blood pressure will be evident.   Distressing pain 7 Self-care is very difficult. Assistance is required to transport, or use restroom. Assistance to reach the emergency department will be required. Tasks requiring coordination, such as bathing and getting dressed become very difficult.   Disabling pain 8 Self-care is no longer possible. At this level, pain is disabling. The individual is unable to do even the most basic activities such as walking, eating, bathing, dressing, transferring to a bed, or toileting. Fine motor skills are lost. It is difficult to think clearly.   Incapacitating pain 9 Pain becomes incapacitating. Thought processing is no longer possible. Difficult to remember your own name. Control of movement and coordination are lost.   The worst pain imaginable 10 At this level, most patients pass out from pain. When this level is reached, collapse of the autonomic nervous system occurs, leading to a sudden drop in blood pressure and heart rate. This in turn results in a temporary and dramatic drop in blood flow to the brain, leading to a loss of consciousness. Fainting is one of the bodys self defense mechanisms. Passing out puts the brain in a calmed state and causes it to shut down for a while, in order to begin the healing process.    Summary: 1. Refer to this scale when providing [place] with your pain  level. 2. Be accurate and careful when reporting your pain level. This will help with your care. 3. Over-reporting your pain level will lead to loss of credibility. 4. Even a level of 1/10 means that there is pain and will be treated at our facility. 5. High, inaccurate reporting will be documented as Symptom Exaggeration, leading to loss of credibility and suspicions of possible secondary gains such as obtaining more narcotics, or wanting to appear disabled, for fraudulent reasons. 6. Only pain levels of 5 or below will be seen at our facility. 7. Pain levels of 6 and above will be sent to the Emergency Department and the appointment cancelled. _____________________________________________________________________________________________  DRUG HOLIDAYS  Definitions Tolerance: defined as the progressively decreased responsiveness to a drug. Occurs when the drug is used repeatedly and the body adapts to the continued presence of the drug. As a result, a larger dose of the drug is needed to achieve the effect originally obtained by a smaller dose. It is thought to be due to the formation of excess opioid receptors.  Drug Holiday: is when a patient stops taking a medication(s) for a period of time; anywhere from a few days to several weeks.  Withdrawals: refers to the wide range of symptoms that occur after stopping or dramatically reducing opiate drugs after heavy and prolonged use. Withdrawal symptoms do not occur to patients that use low dose opioids, or those who take the medication  sporadically. Contrary to benzodiazepine (example: Valium, Xanax, etc.) or alcohol withdrawals (Delirium Tremens), opioid withdrawals are not lethal. Withdrawals are the physical manifestation of the body getting rid of the excess receptors.  Purpose To eliminate tolerance.  Duration of Holiday 14 consecutive days. (2 weeks)  Expected Symptoms Early symptoms of withdrawal  include:  Agitation  Anxiety  Muscle aches  Increased tearing  Insomnia  Runny nose  Sweating  Yawning  Late symptoms of withdrawal include:  Abdominal cramping  Diarrhea  Dilated pupils  Goose bumps  Nausea  Vomiting  Opioid withdrawal reactions are very uncomfortable but are not life-threatening. Symptoms usually start within 12 hours of last opioid dose and within 30 hours of last methadone exposure.  Duration of Symptoms 48 to 72 hours for short acting medications and 2 to 14 days for methadone.  Treatment  Clonidine (Catapres) or tizanidine (Zanaflex) for agitation, sweating, tearing, runny nose.  Promethazine (Phenergan) for nausea, vomiting.  NSAIDs for pain.  Benefits  Improved effectiveness of opioids.  Decreased opioid dose needed to achieve benefits.  Improved pain with lesser dose.  GENERAL RISKS AND COMPLICATIONS  What are the risk, side effects and possible complications? Generally speaking, most procedures are safe.  However, with any procedure there are risks, side effects, and the possibility of complications.  The risks and complications are dependent upon the sites that are lesioned, or the type of nerve block to be performed.  The closer the procedure is to the spine, the more serious the risks are.  Great care is taken when placing the radio frequency needles, block needles or lesioning probes, but sometimes complications can occur. 1. Infection: Any time there is an injection through the skin, there is a risk of infection.  This is why sterile conditions are used for these blocks.  There are four possible types of infection. 1. Localized skin infection. 2. Central Nervous System Infection-This can be in the form of Meningitis, which can be deadly. 3. Epidural Infections-This can be in the form of an epidural abscess, which can cause pressure inside of the spine, causing compression of the spinal cord with subsequent paralysis. This  would require an emergency surgery to decompress, and there are no guarantees that the patient would recover from the paralysis. 4. Discitis-This is an infection of the intervertebral discs.  It occurs in about 1% of discography procedures.  It is difficult to treat and it may lead to surgery.        2. Pain: the needles have to go through skin and soft tissues, will cause soreness.       3. Damage to internal structures:  The nerves to be lesioned may be near blood vessels or    other nerves which can be potentially damaged.       4. Bleeding: Bleeding is more common if the patient is taking blood thinners such as  aspirin, Coumadin, Ticiid, Plavix, etc., or if he/she have some genetic predisposition  such as hemophilia. Bleeding into the spinal canal can cause compression of the spinal  cord with subsequent paralysis.  This would require an emergency surgery to  decompress and there are no guarantees that the patient would recover from the  paralysis.       5. Pneumothorax:  Puncturing of a lung is a possibility, every time a needle is introduced in  the area of the chest or upper back.  Pneumothorax refers to free air around the  collapsed lung(s), inside of the thoracic cavity (  chest cavity).  Another two possible  complications related to a similar event would include: Hemothorax and Chylothorax.   These are variations of the Pneumothorax, where instead of air around the collapsed  lung(s), you may have blood or chyle, respectively.       6. Spinal headaches: They may occur with any procedures in the area of the spine.       7. Persistent CSF (Cerebro-Spinal Fluid) leakage: This is a rare problem, but may occur  with prolonged intrathecal or epidural catheters either due to the formation of a fistulous  track or a dural tear.       8. Nerve damage: By working so close to the spinal cord, there is always a possibility of  nerve damage, which could be as serious as a permanent spinal cord injury with   paralysis.       9. Death:  Although rare, severe deadly allergic reactions known as "Anaphylactic  reaction" can occur to any of the medications used.      10. Worsening of the symptoms:  We can always make thing worse.  What are the chances of something like this happening? Chances of any of this occuring are extremely low.  By statistics, you have more of a chance of getting killed in a motor vehicle accident: while driving to the hospital than any of the above occurring .  Nevertheless, you should be aware that they are possibilities.  In general, it is similar to taking a shower.  Everybody knows that you can slip, hit your head and get killed.  Does that mean that you should not shower again?  Nevertheless always keep in mind that statistics do not mean anything if you happen to be on the wrong side of them.  Even if a procedure has a 1 (one) in a 1,000,000 (million) chance of going wrong, it you happen to be that one..Also, keep in mind that by statistics, you have more of a chance of having something go wrong when taking medications.  Who should not have this procedure? If you are on a blood thinning medication (e.g. Coumadin, Plavix, see list of "Blood Thinners"), or if you have an active infection going on, you should not have the procedure.  If you are taking any blood thinners, please inform your physician.  How should I prepare for this procedure?  Do not eat or drink anything at least six hours prior to the procedure.  Bring a driver with you .  It cannot be a taxi.  Come accompanied by an adult that can drive you back, and that is strong enough to help you if your legs get weak or numb from the local anesthetic.  Take all of your medicines the morning of the procedure with just enough water to swallow them.  If you have diabetes, make sure that you are scheduled to have your procedure done first thing in the morning, whenever possible.  If you have diabetes, take only half of  your insulin dose and notify our nurse that you have done so as soon as you arrive at the clinic.  If you are diabetic, but only take blood sugar pills (oral hypoglycemic), then do not take them on the morning of your procedure.  You may take them after you have had the procedure.  Do not take aspirin or any aspirin-containing medications, at least eleven (11) days prior to the procedure.  They may prolong bleeding.  Wear loose fitting clothing that may  be easy to take off and that you would not mind if it got stained with Betadine or blood.  Do not wear any jewelry or perfume  Remove any nail coloring.  It will interfere with some of our monitoring equipment.  NOTE: Remember that this is not meant to be interpreted as a complete list of all possible complications.  Unforeseen problems may occur.  BLOOD THINNERS The following drugs contain aspirin or other products, which can cause increased bleeding during surgery and should not be taken for 2 weeks prior to and 1 week after surgery.  If you should need take something for relief of minor pain, you may take acetaminophen which is found in Tylenol,m Datril, Anacin-3 and Panadol. It is not blood thinner. The products listed below are.  Do not take any of the products listed below in addition to any listed on your instruction sheet.  A.P.C or A.P.C with Codeine Codeine Phosphate Capsules #3 Ibuprofen Ridaura  ABC compound Congesprin Imuran rimadil  Advil Cope Indocin Robaxisal  Alka-Seltzer Effervescent Pain Reliever and Antacid Coricidin or Coricidin-D  Indomethacin Rufen  Alka-Seltzer plus Cold Medicine Cosprin Ketoprofen S-A-C Tablets  Anacin Analgesic Tablets or Capsules Coumadin Korlgesic Salflex  Anacin Extra Strength Analgesic tablets or capsules CP-2 Tablets Lanoril Salicylate  Anaprox Cuprimine Capsules Levenox Salocol  Anexsia-D Dalteparin Magan Salsalate  Anodynos Darvon compound Magnesium Salicylate Sine-off  Ansaid Dasin  Capsules Magsal Sodium Salicylate  Anturane Depen Capsules Marnal Soma  APF Arthritis pain formula Dewitt's Pills Measurin Stanback  Argesic Dia-Gesic Meclofenamic Sulfinpyrazone  Arthritis Bayer Timed Release Aspirin Diclofenac Meclomen Sulindac  Arthritis pain formula Anacin Dicumarol Medipren Supac  Analgesic (Safety coated) Arthralgen Diffunasal Mefanamic Suprofen  Arthritis Strength Bufferin Dihydrocodeine Mepro Compound Suprol  Arthropan liquid Dopirydamole Methcarbomol with Aspirin Synalgos  ASA tablets/Enseals Disalcid Micrainin Tagament  Ascriptin Doan's Midol Talwin  Ascriptin A/D Dolene Mobidin Tanderil  Ascriptin Extra Strength Dolobid Moblgesic Ticlid  Ascriptin with Codeine Doloprin or Doloprin with Codeine Momentum Tolectin  Asperbuf Duoprin Mono-gesic Trendar  Aspergum Duradyne Motrin or Motrin IB Triminicin  Aspirin plain, buffered or enteric coated Durasal Myochrisine Trigesic  Aspirin Suppositories Easprin Nalfon Trillsate  Aspirin with Codeine Ecotrin Regular or Extra Strength Naprosyn Uracel  Atromid-S Efficin Naproxen Ursinus  Auranofin Capsules Elmiron Neocylate Vanquish  Axotal Emagrin Norgesic Verin  Azathioprine Empirin or Empirin with Codeine Normiflo Vitamin E  Azolid Emprazil Nuprin Voltaren  Bayer Aspirin plain, buffered or children's or timed BC Tablets or powders Encaprin Orgaran Warfarin Sodium  Buff-a-Comp Enoxaparin Orudis Zorpin  Buff-a-Comp with Codeine Equegesic Os-Cal-Gesic   Buffaprin Excedrin plain, buffered or Extra Strength Oxalid   Bufferin Arthritis Strength Feldene Oxphenbutazone   Bufferin plain or Extra Strength Feldene Capsules Oxycodone with Aspirin   Bufferin with Codeine Fenoprofen Fenoprofen Pabalate or Pabalate-SF   Buffets II Flogesic Panagesic   Buffinol plain or Extra Strength Florinal or Florinal with Codeine Panwarfarin   Buf-Tabs Flurbiprofen Penicillamine   Butalbital Compound Four-way cold tablets Penicillin    Butazolidin Fragmin Pepto-Bismol   Carbenicillin Geminisyn Percodan   Carna Arthritis Reliever Geopen Persantine   Carprofen Gold's salt Persistin   Chloramphenicol Goody's Phenylbutazone   Chloromycetin Haltrain Piroxlcam   Clmetidine heparin Plaquenil   Cllnoril Hyco-pap Ponstel   Clofibrate Hydroxy chloroquine Propoxyphen         Before stopping any of these medications, be sure to consult the physician who ordered them.  Some, such as Coumadin (Warfarin) are ordered to prevent or treat serious conditions such as "  deep thrombosis", "pumonary embolisms", and other heart problems.  The amount of time that you may need off of the medication may also vary with the medication and the reason for which you were taking it.  If you are taking any of these medications, please make sure you notify your pain physician before you undergo any procedures.         Epidural Steroid Injection Patient Information  Description: The epidural space surrounds the nerves as they exit the spinal cord.  In some patients, the nerves can be compressed and inflamed by a bulging disc or a tight spinal canal (spinal stenosis).  By injecting steroids into the epidural space, we can bring irritated nerves into direct contact with a potentially helpful medication.  These steroids act directly on the irritated nerves and can reduce swelling and inflammation which often leads to decreased pain.  Epidural steroids may be injected anywhere along the spine and from the neck to the low back depending upon the location of your pain.   After numbing the skin with local anesthetic (like Novocaine), a small needle is passed into the epidural space slowly.  You may experience a sensation of pressure while this is being done.  The entire block usually last less than 10 minutes.  Conditions which may be treated by epidural steroids:   Low back and leg pain  Neck and arm pain  Spinal stenosis  Post-laminectomy  syndrome  Herpes zoster (shingles) pain  Pain from compression fractures  Preparation for the injection:  1. Do not eat any solid food or dairy products within 8 hours of your appointment.  2. You may drink clear liquids up to 3 hours before appointment.  Clear liquids include water, black coffee, juice or soda.  No milk or cream please. 3. You may take your regular medication, including pain medications, with a sip of water before your appointment  Diabetics should hold regular insulin (if taken separately) and take 1/2 normal NPH dos the morning of the procedure.  Carry some sugar containing items with you to your appointment. 4. A driver must accompany you and be prepared to drive you home after your procedure.  5. Bring all your current medications with your. 6. An IV may be inserted and sedation may be given at the discretion of the physician.   7. A blood pressure cuff, EKG and other monitors will often be applied during the procedure.  Some patients may need to have extra oxygen administered for a short period. 8. You will be asked to provide medical information, including your allergies, prior to the procedure.  We must know immediately if you are taking blood thinners (like Coumadin/Warfarin)  Or if you are allergic to IV iodine contrast (dye). We must know if you could possible be pregnant.  Possible side-effects:  Bleeding from needle site  Infection (rare, may require surgery)  Nerve injury (rare)  Numbness & tingling (temporary)  Difficulty urinating (rare, temporary)  Spinal headache ( a headache worse with upright posture)  Light -headedness (temporary)  Pain at injection site (several days)  Decreased blood pressure (temporary)  Weakness in arm/leg (temporary)  Pressure sensation in back/neck (temporary)  Call if you experience:  Fever/chills associated with headache or increased back/neck pain.  Headache worsened by an upright position.  New onset  weakness or numbness of an extremity below the injection site  Hives or difficulty breathing (go to the emergency room)  Inflammation or drainage at the infection site  Severe back/neck  pain  Any new symptoms which are concerning to you  Please note:  Although the local anesthetic injected can often make your back or neck feel good for several hours after the injection, the pain will likely return.  It takes 3-7 days for steroids to work in the epidural space.  You may not notice any pain relief for at least that one week.  If effective, we will often do a series of three injections spaced 3-6 weeks apart to maximally decrease your pain.  After the initial series, we generally will wait several months before considering a repeat injection of the same type.  If you have any questions, please call [phone] [facility]

## 2016-06-15 NOTE — Progress Notes (Signed)
Patient's Name: April Price  MRN: [id number]  Referring Provider: Frederich Cha, MD  DOB: [birth year]  PCP: Pcp Not In System  DOS: 06/15/2016  Note by: Kathlen Brunswick. Dossie Arbour, MD  Service setting: Ambulatory outpatient  Specialty: Interventional Pain Management  Location: [facility] (AMB) Pain Management Facility    Patient type: Established   Primary Reason(s) for Visit: Encounter for prescription drug management (Level of risk: moderate) CC: Pain (fibromyalgia); Joint Pain (arthritis); and Hip Pain (sciatica)  HPI  April Price is a 61 y.o. year old, female patient, who comes today for a medication management evaluation. She has Fibromyalgia; Depression; History of seasonal allergies; Situational disturbance; Elevated blood pressure; Spasm of back muscles; Chronic neck pain; Dry eyes, bilateral; Hypoglycemia; Hypertension; Chronic fatigue syndrome; Osteopenia; Vaginal atrophy; History of fibrocystic disease of breast; Chronic pain syndrome; Long term current use of opiate analgesic; Long term prescription opiate use; Opiate use (80-140 MME/Day); Chronic shoulder pain (Location of Primary Source of Pain) (Bilateral) (L>R); Chronic hip pain (Location of Secondary source of pain) (Bilateral) (L>R); Chronic sacroiliac joint pain (Location of Tertiary source of pain) (Bilateral) (L>R); Chronic low back pain (Bilateral) (L>R); Failed back surgical syndrome; History of lumbar fusion; Chronic lower extremity pain (Bilateral) (L>R); Chronic knee pain (Bilateral) (L>R); Chronic elbow pain (Bilateral) (L>R); Chronic hand pain (Bilateral) (L>R); Musculoskeletal pain; Neurogenic pain; Vitamin D deficiency; and Chronic lumbar radicular pain (Left) on her problem list. Her primarily concern today is the Pain (fibromyalgia); Joint Pain (arthritis); and Hip Pain (sciatica)  Pain Assessment: Self-Reported Pain Score: 4 /10 Clinically the patient looks like a 3/10 Reported level is inconsistent with clinical  observations. Information on the proper use of the pain scale provided to the patient today Pain Type: Chronic pain Pain Location: Other (Comment) (fibromyalgia, arthritis, sciatica) Pain Orientation:  (generalized pain. ) Pain Descriptors / Indicators: Constant, Aching, Discomfort Pain Frequency: Constant  April Price was last scheduled for an appointment on 04/18/2016 for medication management. During today's appointment we reviewed April Price chronic pain status, as well as her outpatient medication regimen. Left forearm fracture. She had surgery for it and she was given Oxycodone for it. Today we will eliminate the Tramadol. She is pending to come back for a Caudal ESI.  The patient  reports that she does not use drugs. Her body mass index is 29.18 kg/m.  Further details on both, my assessment(s), as well as the proposed treatment plan, please see below.  Controlled Substance Pharmacotherapy Assessment REMS (Risk Evaluation and Mitigation Strategy)  Analgesic: Duragesic patch 25 g per hour every 72 hours MME/day:4m/day April Price  06/15/2016  8:52 AM  Sign at close encounter Nursing Pain Medication Assessment:  Safety precautions to be maintained throughout the outpatient stay will include: orient to surroundings, keep bed in low position, maintain call bell within reach at all times, provide assistance with transfer out of bed and ambulation.  Medication Inspection Compliance: Pill count conducted under aseptic conditions, in front of the patient. Neither the pills nor the bottle was removed from the patient's sight at any time. Once count was completed pills were immediately returned to the patient in their original bottle.  Medication #1: Fentanyl patch Pill/Patch Count: 1 of 10 patches remain Bottle Appearance: Standard pharmacy container. Clearly labeled. Filled Date:02 /09 / 2018 Last Medication intake:  Day before yesterday  Medication #2: Tramadol  (Ultram) Pill/Patch Count: 18 of 120 pills remain Bottle Appearance: Standard pharmacy container. Clearly labeled. Filled Date: 02 / 09 / 2018  Last Medication intake:  Today  Janett Billow, Price  06/15/2016  8:48 AM  Sign at close encounter Patient fell approximately 1 month ago after losing balance in her kitchen while stooping to pick something up.  This required ORIF by Dr Earnestine Leys, see surgical history for further details.    Pharmacokinetics: Liberation and absorption (onset of action): WNL Distribution (time to peak effect): WNL Metabolism and excretion (duration of action): WNL         Pharmacodynamics: Desired effects: Analgesia: [name] reports >50% benefit. Functional ability: Patient reports that medication allows her to accomplish basic ADLs Clinically meaningful improvement in function (CMIF): Sustained CMIF goals met Perceived effectiveness: Described as relatively effective, allowing for increase in activities of daily living (ADL) Undesirable effects: Side-effects or Adverse reactions: None reported Monitoring: [place] PMP: Online review of the past 33-monthperiod conducted. Compliant with practice rules and regulations List of all UDS test(s) done:  Lab Results  Component Value Date   SUMMARY FINAL 02/10/2016   Last UDS on record: No results found for: TOXASSSELUR UDS interpretation: Compliant          Medication Assessment Form: Reviewed. Patient indicates being compliant with therapy Treatment compliance: Compliant Risk Assessment Profile: Aberrant behavior: See prior evaluations. None observed or detected today Comorbid factors increasing risk of overdose: See prior notes. No additional risks detected today Risk of substance use disorder (SUD): Low Opioid Risk Tool (ORT) Total Score: 0  Interpretation Table:  Score <3 = Low Risk for SUD  Score between 4-7 = Moderate Risk for SUD  Score >8 = High Risk for Opioid Abuse   Risk Mitigation  Strategies:  Patient Counseling: Covered Patient-Prescriber Agreement (PPA): Present and active  Notification to other healthcare providers: Done  Pharmacologic Plan: No change in therapy, at this time  Laboratory Chemistry  Inflammation Markers Lab Results  Component Value Date   CRP <0.8 02/11/2016   ESRSEDRATE 2 02/11/2016   (CRP: Acute Phase) (ESR: Chronic Phase) Renal Function Markers Lab Results  Component Value Date   BUN 19 02/11/2016   CREATININE 1.05 (H) 02/11/2016   GFRAA >60 02/11/2016   GFRNONAA 57 (L) 02/11/2016   Hepatic Function Markers Lab Results  Component Value Date   AST 16 02/11/2016   ALT 11 (L) 02/11/2016   ALBUMIN 4.0 02/11/2016   ALKPHOS 80 02/11/2016   Electrolytes Lab Results  Component Value Date   NA 137 02/11/2016   K 4.2 02/11/2016   CL 108 02/11/2016   CALCIUM 9.7 02/11/2016   MG 2.0 02/11/2016   Neuropathy Markers Lab Results  Component Value Date   VITAMINB12 227 02/11/2016   Bone Pathology Markers Lab Results  Component Value Date   ALKPHOS 80 02/11/2016   25OHVITD1 18 (L) 02/11/2016   25OHVITD2 <1.0 02/11/2016   25OHVITD3 18 02/11/2016   CALCIUM 9.7 02/11/2016   Coagulation Parameters Lab Results  Component Value Date   PLT 207 06/01/2016   Cardiovascular Markers Lab Results  Component Value Date   HGB 18.2 (H) 06/01/2016   HCT 52.3 (H) 06/01/2016   Note: Lab results reviewed.  Recent Diagnostic Imaging Review  No results found. Note: Imaging results reviewed.          Meds  The patient has a current medication list which includes the following prescription(s): albuterol, cholecalciferol, diphenhydramine, epinephrine, esomeprazole, fentanyl, naloxone, oxycodone, prednisone, ranitidine, and tizanidine.  Current Outpatient Prescriptions on File Prior to Visit  Medication Sig   albuterol (PROVENTIL HFA;VENTOLIN HFA) 108 (  90 Base) MCG/ACT inhaler Inhale 2 puffs into the lungs every 4 (four) hours as needed  for wheezing or shortness of breath.   cholecalciferol (VITAMIN D) 1000 units tablet Take 1,000 Units by mouth daily.   EPINEPHrine (EPIPEN 2-PAK) 0.3 mg/0.3 mL IJ SOAJ injection Inject 0.3 mLs (0.3 mg total) into the muscle once. (Patient taking differently: Inject 0.3 mg into the muscle daily as needed (allergic reaction). )   esomeprazole (NEXIUM) 40 MG capsule Take 1 capsule (40 mg total) by mouth daily.   naloxone [facility]) 2 MG/2ML injection Inject content of syringe into thigh muscle. Call 911.   oxyCODONE (ROXICODONE) 5 MG immediate release tablet Take 1 tablet (5 mg total) by mouth every 6 (six) hours as needed for severe pain.   ranitidine (ZANTAC) 150 MG tablet Take 1 tablet (150 mg total) by mouth 2 (two) times daily.   No current facility-administered medications on file prior to visit.    ROS  Constitutional: Denies any fever or chills Gastrointestinal: No reported hemesis, hematochezia, vomiting, or acute GI distress Musculoskeletal: Denies any acute onset joint swelling, redness, loss of ROM, or weakness Neurological: No reported episodes of acute onset apraxia, aphasia, dysarthria, agnosia, amnesia, paralysis, loss of coordination, or loss of consciousness  Allergies  Ms. Levitan is allergic to gabapentin; penicillins; tylenol [acetaminophen]; morphine and related; pepcid [famotidine]; shrimp [shellfish allergy]; tape; and tetanus toxoids.  [place]  Drug: Ms. Brienza  reports that she does not use drugs. Alcohol:  reports that she does not drink alcohol. Tobacco:  reports that she has been smoking Cigarettes.  She has a 15.00 pack-year smoking history. She has never used smokeless tobacco. Medical:  has a past medical history of Allergy; Anxiety; Arthritis; Asthma; Chronic fatigue; Depression; Depression screen; Diffuse cystic mastopathy; Fibromyalgia; Fibromyalgia muscle pain; GERD (gastroesophageal reflux disease); Heart murmur; History of kidney stones; Hypertension;  and Neuromuscular disorder ([place]). Family: family history includes Heart disease in her brother, father, and mother; Vascular Disease in her sister.  Past Surgical History:  Procedure Laterality Date   ABDOMINAL HYSTERECTOMY     BACK SURGERY     Spinal Fusion   BREAST BIOPSY Right    cataract     CHOLECYSTECTOMY     COLONOSCOPY  2006   EYE SURGERY Bilateral 2011   cataract   NASAL SINUS SURGERY     NASAL SINUS SURGERY     OPEN REDUCTION INTERNAL FIXATION (ORIF) DISTAL RADIAL FRACTURE Left 06/05/2016   Procedure: OPEN REDUCTION INTERNAL FIXATION (ORIF) DISTAL RADIAL FRACTURE;  Surgeon: Earnestine Leys, MD;  Location: [facility] ORS;  Service: Orthopedics;  Laterality: Left;  Hand Innovations needed   SPINE SURGERY  2003, 2012   TYMPANOSTOMY TUBE PLACEMENT     Constitutional Exam  General appearance: Well nourished, well developed, and well hydrated. In no apparent acute distress Vitals:   06/15/16 0835  BP: 140/74  Pulse: 86  Resp: 16  Temp: 97.9 F (36.6 C)  TempSrc: Oral  SpO2: 98%  Weight: 170 lb (77.1 kg)  Height: 5' 4" (1.626 m)   BMI Assessment: Estimated body mass index is 29.18 kg/m as calculated from the following:   Height as of this encounter: 5' 4" (1.626 m).   Weight as of this encounter: 170 lb (77.1 kg).  BMI interpretation table: BMI level Category Range association with higher incidence of chronic pain  <18 kg/m2 Underweight   18.5-24.9 kg/m2 Ideal body weight   25-29.9 kg/m2 Overweight Increased incidence by 20%  30-34.9 kg/m2 Obese (  Class I) Increased incidence by 68%  35-39.9 kg/m2 Severe obesity (Class II) Increased incidence by 136%  >40 kg/m2 Extreme obesity (Class III) Increased incidence by 254%   BMI Readings from Last 4 Encounters:  06/15/16 29.18 kg/m  06/05/16 29.18 kg/m  06/01/16 29.18 kg/m  05/15/16 29.18 kg/m   Wt Readings from Last 4 Encounters:  06/15/16 170 lb (77.1 kg)  06/05/16 170 lb (77.1 kg)  06/01/16 170 lb (77.1  kg)  05/15/16 170 lb (77.1 kg)  Psych/Mental status: Alert, oriented x 3 (person, place, & time)       Eyes: PERLA Respiratory: No evidence of acute respiratory distress  Cervical Spine Exam  Inspection: No masses, redness, or swelling Alignment: Symmetrical Functional ROM: Unrestricted ROM Stability: No instability detected Muscle strength & Tone: Functionally intact Sensory: Unimpaired Palpation: Non-contributory  Upper Extremity (UE) Exam    Side: Right upper extremity  Side: Left upper extremity  Inspection: No masses, redness, swelling, or asymmetry. No contractures  Inspection: No masses, redness, swelling, or asymmetry. No contractures  Functional ROM: Unrestricted ROM          Functional ROM: Unrestricted ROM          Muscle strength & Tone: Functionally intact  Muscle strength & Tone: Functionally intact  Sensory: Unimpaired  Sensory: Unimpaired  Palpation: Euthermic  Palpation: Euthermic  Specialized Test(s): Deferred         Specialized Test(s): Deferred          Thoracic Spine Exam  Inspection: No masses, redness, or swelling Alignment: Symmetrical Functional ROM: Unrestricted ROM Stability: No instability detected Sensory: Unimpaired Muscle strength & Tone: Functionally intact Palpation: Non-contributory  Lumbar Spine Exam  Inspection: No masses, redness, or swelling Alignment: Symmetrical Functional ROM: Unrestricted ROM Stability: No instability detected Muscle strength & Tone: Functionally intact Sensory: Unimpaired Palpation: Non-contributory Provocative Tests: Lumbar Hyperextension and rotation test: evaluation deferred today       Patrick's Maneuver: evaluation deferred today              Gait & Posture Assessment  Ambulation: Unassisted Gait: Relatively normal for age and body habitus Posture: WNL   Lower Extremity Exam    Side: Right lower extremity  Side: Left lower extremity  Inspection: No masses, redness, swelling, or asymmetry. No  contractures  Inspection: No masses, redness, swelling, or asymmetry. No contractures  Functional ROM: Unrestricted ROM          Functional ROM: Unrestricted ROM          Muscle strength & Tone: Functionally intact  Muscle strength & Tone: Functionally intact  Sensory: Unimpaired  Sensory: Unimpaired  Palpation: No palpable anomalies  Palpation: No palpable anomalies   Assessment  Primary Diagnosis & Pertinent Problem List: The primary encounter diagnosis was Chronic shoulder pain (Location of Primary Source of Pain) (Bilateral) (L>R). Diagnoses of Chronic hip pain (Location of Secondary source of pain) (Bilateral) (L>R), Chronic sacroiliac joint pain (Location of Tertiary source of pain) (Bilateral) (L>R), Chronic pain syndrome, Long term current use of opiate analgesic, Opiate use (80-140 MME/Day), Failed back surgical syndrome, and Fibromyalgia were also pertinent to this visit.  Status Diagnosis  Controlled Controlled Controlled 1. Chronic shoulder pain (Location of Primary Source of Pain) (Bilateral) (L>R)   2. Chronic hip pain (Location of Secondary source of pain) (Bilateral) (L>R)   3. Chronic sacroiliac joint pain (Location of Tertiary source of pain) (Bilateral) (L>R)   4. Chronic pain syndrome   5. Long term current use of  opiate analgesic   6. Opiate use (80-140 MME/Day)   7. Failed back surgical syndrome   8. Fibromyalgia      Plan of Care  Pharmacotherapy (Medications Ordered): Meds ordered this encounter  Medications   diphenhydrAMINE (BENADRYL) 50 MG capsule    Sig: Take 1 cap PO 1 hr prior to procedure.    Dispense:  30 capsule    Refill:  0    Contrast allergy prophylactic premedication.   predniSONE (DELTASONE) 50 MG tablet    Sig: Take 1 tab PO 13 hrs before procedure, 1 tab 7 hrs before procedure, and 1 tab 1 hr before procedure.    Dispense:  3 tablet    Refill:  0    Contrast allergy premedication.   DISCONTD: fentaNYL (DURAGESIC) 25 MCG/HR patch     Sig: Place 1 patch (25 mcg total) onto the skin every 3 (three) days.    Dispense:  10 patch    Refill:  0    Patient may have prescription filled one day early if pharmacy is closed on scheduled refill date. Do not fill until: 06/17/16 To last until: 07/17/16   DISCONTD: traMADol (ULTRAM) 50 MG tablet    Sig: Take 1 tablet (50 mg total) by mouth 4 (four) times daily.    Dispense:  120 tablet    Refill:  0    Patient may have prescription filled one day early if pharmacy is closed on scheduled refill date. Do not fill until: 06/17/16 To last until: 07/17/16   fentaNYL (DURAGESIC) 25 MCG/HR patch    Sig: Place 1 patch (25 mcg total) onto the skin every 3 (three) days.    Dispense:  10 patch    Refill:  0    Patient may have prescription filled one day early if pharmacy is closed on scheduled refill date. Do not fill until: 06/17/16 To last until: 07/17/16   tiZANidine (ZANAFLEX) 4 MG tablet    Sig: Take 2 tablets (8 mg total) by mouth every 8 (eight) hours as needed for muscle spasms.    Dispense:  180 tablet    Refill:  0    Do not add this medication to the electronic "Automatic Refill" notification system. Patient may have prescription filled one day early if pharmacy is closed on scheduled refill date.   New Prescriptions   No medications on file   Medications administered today: Ms. Chisom had no medications administered during this visit. Lab-work, procedure(s), and/or referral(s): Orders Placed This Encounter  Procedures   Caudal Epidural Injection   ToxASSURE Select 13 (MW), Urine   Imaging and/or referral(s): None  Interventional therapies: Planned, scheduled, and/or pending:   Caudal epidural steroid injection + epidurogram (iodine allergy premedication ordered)    Considering:   Diagnostic intra-articular shoulder joint injection  Diagnostic suprascapular nerve block  Possible bilateral suprascapular nerve radiofrequency ablation  Diagnostic bilateral  intra-articular hip joint injection  Diagnostic bilateral femoral nerve and obturator nerve articular branch block  Possible bilateral femoral nerve and obturator nerve articular branch radiofrequency ablation for the hip joint pain  Diagnostic intra-articular SI joint block  Possible bilateral sacroiliac joint radiofrequency ablation  Diagnostic caudal epidural steroid injection + epidurogram  Possible Racz procedure  Diagnostic bilateral intra-articular knee joint injection  Diagnostic bilateral genicular nerve blocks  Possible bilateral genicular radiofrequency ablation    Palliative PRN treatment(s):   None at this time.    Provider-requested follow-up: Return in about 1 month (around 07/16/2016) for procedure (ASAP), (MD)  Med-Mgmt.  Future Appointments Date Time Provider [place]  06/21/2016 10:00 AM Milinda Pointer, MD [facility] None  07/06/2016 8:15 AM Milinda Pointer, MD [facility] None   Primary Care Physician: Pcp Not In System Location: [facility] Outpatient Pain Management Facility Note by: Kathlen Brunswick. Dossie Arbour, M.D, DABA, DABAPM, DABPM, DABIPP, FIPP Date: 06/15/2016; Time: 8:07 AM  Pain Score Disclaimer: We use the NRS-11 scale. This is a self-reported, subjective measurement of pain severity with only modest accuracy. It is used primarily to identify changes within a particular patient. It must be understood that outpatient pain scales are significantly less accurate that those used for research, where they can be applied under ideal controlled circumstances with minimal exposure to variables. In reality, the score is likely to be a combination of pain intensity and pain affect, where pain affect describes the degree of emotional arousal or changes in action readiness caused by the sensory experience of pain. Factors such as social and work situation, setting, emotional state, anxiety levels, expectation, and prior pain experience may influence pain perception and show large  inter-individual differences that may also be affected by time variables.  Patient instructions provided during this appointment: Patient Instructions   Rx for fentanyl duragesic 25 mcg and tramadol 50 mg x 1 month to begin filling on 06/17/16 given to patient  Pain Score  Introduction: The pain score used by this practice is the Verbal Numerical Rating Scale (VNRS-11). This is an 11-point scale. It is for adults and children 10 years or older. There are significant differences in how the pain score is reported, used, and applied. Forget everything you learned in the past and learn this scoring system.  General Information: The scale should reflect your current level of pain. Unless you are specifically asked for the level of your worst pain, or your average pain. If you are asked for one of these two, then it should be understood that it is over the past 24 hours.  Basic Activities of Daily Living (ADL): Personal hygiene, dressing, eating, transferring, and using restroom.  Instructions: Most patients tend to report their level of pain as a combination of two factors, their physical pain and their psychosocial pain. This last one is also known as suffering and it is reflection of how physical pain affects you socially and psychologically. From now on, report them separately. From this point on, when asked to report your pain level, report only your physical pain. Use the following table for reference.  Pain Clinic Pain Levels (0-5/10)  Pain Level Score Description  No Pain 0   Mild pain 1 Nagging, annoying, but does not interfere with basic activities of daily living (ADL). Patients are able to eat, bathe, get dressed, toileting (being able to get on and off the toilet and perform personal hygiene functions), transfer (move in and out of bed or a chair without assistance), and maintain continence (able to control bladder and bowel functions). Blood pressure and heart rate are unaffected. A  normal heart rate for a healthy adult ranges from 60 to 100 bpm (beats per minute).   Mild to moderate pain 2 Noticeable and distracting. Impossible to hide from other people. More frequent flare-ups. Still possible to adapt and function close to normal. It can be very annoying and may have occasional stronger flare-ups. With discipline, patients may get used to it and adapt.   Moderate pain 3 Interferes significantly with activities of daily living (ADL). It becomes difficult to feed, bathe, get dressed, get on and  off the toilet or to perform personal hygiene functions. Difficult to get in and out of bed or a chair without assistance. Very distracting. With effort, it can be ignored when deeply involved in activities.   Moderately severe pain 4 Impossible to ignore for more than a few minutes. With effort, patients may still be able to manage work or participate in some social activities. Very difficult to concentrate. Signs of autonomic nervous system discharge are evident: dilated pupils (mydriasis); mild sweating (diaphoresis); sleep interference. Heart rate becomes elevated (>115 bpm). Diastolic blood pressure (lower number) rises above 100 mmHg. Patients find relief in laying down and not moving.   Severe pain 5 Intense and extremely unpleasant. Associated with frowning face and frequent crying. Pain overwhelms the senses.  Ability to do any activity or maintain social relationships becomes significantly limited. Conversation becomes difficult. Pacing back and forth is common, as getting into a comfortable position is nearly impossible. Pain wakes you up from deep sleep. Physical signs will be obvious: pupillary dilation; increased sweating; goosebumps; brisk reflexes; cold, clammy hands and feet; nausea, vomiting or dry heaves; loss of appetite; significant sleep disturbance with inability to fall asleep or to remain asleep. When persistent, significant weight loss is observed due to the complete  loss of appetite and sleep deprivation.  Blood pressure and heart rate becomes significantly elevated. Caution: If elevated blood pressure triggers a pounding headache associated with blurred vision, then the patient should immediately seek attention at an urgent or emergency care unit, as these may be signs of an impending stroke.    Emergency Department Pain Levels (6-10/10)  Emergency Room Pain 6 Severely limiting. Requires emergency care and should not be seen or managed at an outpatient pain management facility. Communication becomes difficult and requires great effort. Assistance to reach the emergency department may be required. Facial flushing and profuse sweating along with potentially dangerous increases in heart rate and blood pressure will be evident.   Distressing pain 7 Self-care is very difficult. Assistance is required to transport, or use restroom. Assistance to reach the emergency department will be required. Tasks requiring coordination, such as bathing and getting dressed become very difficult.   Disabling pain 8 Self-care is no longer possible. At this level, pain is disabling. The individual is unable to do even the most basic activities such as walking, eating, bathing, dressing, transferring to a bed, or toileting. Fine motor skills are lost. It is difficult to think clearly.   Incapacitating pain 9 Pain becomes incapacitating. Thought processing is no longer possible. Difficult to remember your own name. Control of movement and coordination are lost.   The worst pain imaginable 10 At this level, most patients pass out from pain. When this level is reached, collapse of the autonomic nervous system occurs, leading to a sudden drop in blood pressure and heart rate. This in turn results in a temporary and dramatic drop in blood flow to the brain, leading to a loss of consciousness. Fainting is one of the bodys self defense mechanisms. Passing out puts the brain in a calmed state  and causes it to shut down for a while, in order to begin the healing process.    Summary: 1. Refer to this scale when providing [place] with your pain level. 2. Be accurate and careful when reporting your pain level. This will help with your care. 3. Over-reporting your pain level will lead to loss of credibility. 4. Even a level of 1/10 means that there is pain and  will be treated at our facility. 5. High, inaccurate reporting will be documented as Symptom Exaggeration, leading to loss of credibility and suspicions of possible secondary gains such as obtaining more narcotics, or wanting to appear disabled, for fraudulent reasons. 6. Only pain levels of 5 or below will be seen at our facility. 7. Pain levels of 6 and above will be sent to the Emergency Department and the appointment cancelled. _____________________________________________________________________________________________  DRUG HOLIDAYS  Definitions Tolerance: defined as the progressively decreased responsiveness to a drug. Occurs when the drug is used repeatedly and the body adapts to the continued presence of the drug. As a result, a larger dose of the drug is needed to achieve the effect originally obtained by a smaller dose. It is thought to be due to the formation of excess opioid receptors.  Drug Holiday: is when a patient stops taking a medication(s) for a period of time; anywhere from a few days to several weeks.  Withdrawals: refers to the wide range of symptoms that occur after stopping or dramatically reducing opiate drugs after heavy and prolonged use. Withdrawal symptoms do not occur to patients that use low dose opioids, or those who take the medication sporadically. Contrary to benzodiazepine (example: Valium, Xanax, etc.) or alcohol withdrawals (Delirium Tremens), opioid withdrawals are not lethal. Withdrawals are the physical manifestation of the body getting rid of the excess receptors.  Purpose To eliminate  tolerance.  Duration of Holiday 14 consecutive days. (2 weeks)  Expected Symptoms Early symptoms of withdrawal include:  Agitation  Anxiety  Muscle aches  Increased tearing  Insomnia  Runny nose  Sweating  Yawning  Late symptoms of withdrawal include:  Abdominal cramping  Diarrhea  Dilated pupils  Goose bumps  Nausea  Vomiting  Opioid withdrawal reactions are very uncomfortable but are not life-threatening. Symptoms usually start within 12 hours of last opioid dose and within 30 hours of last methadone exposure.  Duration of Symptoms 48 to 72 hours for short acting medications and 2 to 14 days for methadone.  Treatment  Clonidine (Catapres) or tizanidine (Zanaflex) for agitation, sweating, tearing, runny nose.  Promethazine (Phenergan) for nausea, vomiting.  NSAIDs for pain.  Benefits  Improved effectiveness of opioids.  Decreased opioid dose needed to achieve benefits.  Improved pain with lesser dose.  GENERAL RISKS AND COMPLICATIONS  What are the risk, side effects and possible complications? Generally speaking, most procedures are safe.  However, with any procedure there are risks, side effects, and the possibility of complications.  The risks and complications are dependent upon the sites that are lesioned, or the type of nerve block to be performed.  The closer the procedure is to the spine, the more serious the risks are.  Great care is taken when placing the radio frequency needles, block needles or lesioning probes, but sometimes complications can occur. 1. Infection: Any time there is an injection through the skin, there is a risk of infection.  This is why sterile conditions are used for these blocks.  There are four possible types of infection. 1. Localized skin infection. 2. Central Nervous System Infection-This can be in the form of Meningitis, which can be deadly. 3. Epidural Infections-This can be in the form of an epidural abscess,  which can cause pressure inside of the spine, causing compression of the spinal cord with subsequent paralysis. This would require an emergency surgery to decompress, and there are no guarantees that the patient would recover from the paralysis. 4. Discitis-This is an infection of  the intervertebral discs.  It occurs in about 1% of discography procedures.  It is difficult to treat and it may lead to surgery.        2. Pain: the needles have to go through skin and soft tissues, will cause soreness.       3. Damage to internal structures:  The nerves to be lesioned may be near blood vessels or    other nerves which can be potentially damaged.       4. Bleeding: Bleeding is more common if the patient is taking blood thinners such as  aspirin, Coumadin, Ticiid, Plavix, etc., or if he/she have some genetic predisposition  such as hemophilia. Bleeding into the spinal canal can cause compression of the spinal  cord with subsequent paralysis.  This would require an emergency surgery to  decompress and there are no guarantees that the patient would recover from the  paralysis.       5. Pneumothorax:  Puncturing of a lung is a possibility, every time a needle is introduced in  the area of the chest or upper back.  Pneumothorax refers to free air around the  collapsed lung(s), inside of the thoracic cavity (chest cavity).  Another two possible  complications related to a similar event would include: Hemothorax and Chylothorax.   These are variations of the Pneumothorax, where instead of air around the collapsed  lung(s), you may have blood or chyle, respectively.       6. Spinal headaches: They may occur with any procedures in the area of the spine.       7. Persistent CSF (Cerebro-Spinal Fluid) leakage: This is a rare problem, but may occur  with prolonged intrathecal or epidural catheters either due to the formation of a fistulous  track or a dural tear.       8. Nerve damage: By working so close to the spinal  cord, there is always a possibility of  nerve damage, which could be as serious as a permanent spinal cord injury with  paralysis.       9. Death:  Although rare, severe deadly allergic reactions known as "Anaphylactic  reaction" can occur to any of the medications used.      10. Worsening of the symptoms:  We can always make thing worse.  What are the chances of something like this happening? Chances of any of this occuring are extremely low.  By statistics, you have more of a chance of getting killed in a motor vehicle accident: while driving to the hospital than any of the above occurring .  Nevertheless, you should be aware that they are possibilities.  In general, it is similar to taking a shower.  Everybody knows that you can slip, hit your head and get killed.  Does that mean that you should not shower again?  Nevertheless always keep in mind that statistics do not mean anything if you happen to be on the wrong side of them.  Even if a procedure has a 1 (one) in a 1,000,000 (million) chance of going wrong, it you happen to be that one..Also, keep in mind that by statistics, you have more of a chance of having something go wrong when taking medications.  Who should not have this procedure? If you are on a blood thinning medication (e.g. Coumadin, Plavix, see list of "Blood Thinners"), or if you have an active infection going on, you should not have the procedure.  If you are taking any blood thinners, please  inform your physician.  How should I prepare for this procedure?  Do not eat or drink anything at least six hours prior to the procedure.  Bring a driver with you .  It cannot be a taxi.  Come accompanied by an adult that can drive you back, and that is strong enough to help you if your legs get weak or numb from the local anesthetic.  Take all of your medicines the morning of the procedure with just enough water to swallow them.  If you have diabetes, make sure that you are scheduled  to have your procedure done first thing in the morning, whenever possible.  If you have diabetes, take only half of your insulin dose and notify our nurse that you have done so as soon as you arrive at the clinic.  If you are diabetic, but only take blood sugar pills (oral hypoglycemic), then do not take them on the morning of your procedure.  You may take them after you have had the procedure.  Do not take aspirin or any aspirin-containing medications, at least eleven (11) days prior to the procedure.  They may prolong bleeding.  Wear loose fitting clothing that may be easy to take off and that you would not mind if it got stained with Betadine or blood.  Do not wear any jewelry or perfume  Remove any nail coloring.  It will interfere with some of our monitoring equipment.  NOTE: Remember that this is not meant to be interpreted as a complete list of all possible complications.  Unforeseen problems may occur.  BLOOD THINNERS The following drugs contain aspirin or other products, which can cause increased bleeding during surgery and should not be taken for 2 weeks prior to and 1 week after surgery.  If you should need take something for relief of minor pain, you may take acetaminophen which is found in Tylenol,m Datril, Anacin-3 and Panadol. It is not blood thinner. The products listed below are.  Do not take any of the products listed below in addition to any listed on your instruction sheet.  A.P.C or A.P.C with Codeine Codeine Phosphate Capsules #3 Ibuprofen Ridaura  ABC compound Congesprin Imuran rimadil  Advil Cope Indocin Robaxisal  Alka-Seltzer Effervescent Pain Reliever and Antacid Coricidin or Coricidin-D  Indomethacin Rufen  Alka-Seltzer plus Cold Medicine Cosprin Ketoprofen S-A-C Tablets  Anacin Analgesic Tablets or Capsules Coumadin Korlgesic Salflex  Anacin Extra Strength Analgesic tablets or capsules CP-2 Tablets Lanoril Salicylate  Anaprox Cuprimine Capsules Levenox Salocol   Anexsia-D Dalteparin Magan Salsalate  Anodynos Darvon compound Magnesium Salicylate Sine-off  Ansaid Dasin Capsules Magsal Sodium Salicylate  Anturane Depen Capsules Marnal Soma  APF Arthritis pain formula Dewitt's Pills Measurin Stanback  Argesic Dia-Gesic Meclofenamic Sulfinpyrazone  Arthritis Bayer Timed Release Aspirin Diclofenac Meclomen Sulindac  Arthritis pain formula Anacin Dicumarol Medipren Supac  Analgesic (Safety coated) Arthralgen Diffunasal Mefanamic Suprofen  Arthritis Strength Bufferin Dihydrocodeine Mepro Compound Suprol  Arthropan liquid Dopirydamole Methcarbomol with Aspirin Synalgos  ASA tablets/Enseals Disalcid Micrainin Tagament  Ascriptin Doan's Midol Talwin  Ascriptin A/D Dolene Mobidin Tanderil  Ascriptin Extra Strength Dolobid Moblgesic Ticlid  Ascriptin with Codeine Doloprin or Doloprin with Codeine Momentum Tolectin  Asperbuf Duoprin Mono-gesic Trendar  Aspergum Duradyne Motrin or Motrin IB Triminicin  Aspirin plain, buffered or enteric coated Durasal Myochrisine Trigesic  Aspirin Suppositories Easprin Nalfon Trillsate  Aspirin with Codeine Ecotrin Regular or Extra Strength Naprosyn Uracel  Atromid-S Efficin Naproxen Ursinus  Auranofin Capsules Elmiron Neocylate Vanquish  Axotal Emagrin  Norgesic Verin  Azathioprine Empirin or Empirin with Codeine Normiflo Vitamin E  Azolid Emprazil Nuprin Voltaren  Bayer Aspirin plain, buffered or children's or timed BC Tablets or powders Encaprin Orgaran Warfarin Sodium  Buff-a-Comp Enoxaparin Orudis Zorpin  Buff-a-Comp with Codeine Equegesic Os-Cal-Gesic   Buffaprin Excedrin plain, buffered or Extra Strength Oxalid   Bufferin Arthritis Strength Feldene Oxphenbutazone   Bufferin plain or Extra Strength Feldene Capsules Oxycodone with Aspirin   Bufferin with Codeine Fenoprofen Fenoprofen Pabalate or Pabalate-SF   Buffets II Flogesic Panagesic   Buffinol plain or Extra Strength Florinal or Florinal with Codeine  Panwarfarin   Buf-Tabs Flurbiprofen Penicillamine   Butalbital Compound Four-way cold tablets Penicillin   Butazolidin Fragmin Pepto-Bismol   Carbenicillin Geminisyn Percodan   Carna Arthritis Reliever Geopen Persantine   Carprofen Gold's salt Persistin   Chloramphenicol Goody's Phenylbutazone   Chloromycetin Haltrain Piroxlcam   Clmetidine heparin Plaquenil   Cllnoril Hyco-pap Ponstel   Clofibrate Hydroxy chloroquine Propoxyphen         Before stopping any of these medications, be sure to consult the physician who ordered them.  Some, such as Coumadin (Warfarin) are ordered to prevent or treat serious conditions such as "deep thrombosis", "pumonary embolisms", and other heart problems.  The amount of time that you may need off of the medication may also vary with the medication and the reason for which you were taking it.  If you are taking any of these medications, please make sure you notify your pain physician before you undergo any procedures.         Epidural Steroid Injection Patient Information  Description: The epidural space surrounds the nerves as they exit the spinal cord.  In some patients, the nerves can be compressed and inflamed by a bulging disc or a tight spinal canal (spinal stenosis).  By injecting steroids into the epidural space, we can bring irritated nerves into direct contact with a potentially helpful medication.  These steroids act directly on the irritated nerves and can reduce swelling and inflammation which often leads to decreased pain.  Epidural steroids may be injected anywhere along the spine and from the neck to the low back depending upon the location of your pain.   After numbing the skin with local anesthetic (like Novocaine), a small needle is passed into the epidural space slowly.  You may experience a sensation of pressure while this is being done.  The entire block usually last less than 10 minutes.  Conditions which may be treated by epidural  steroids:   Low back and leg pain  Neck and arm pain  Spinal stenosis  Post-laminectomy syndrome  Herpes zoster (shingles) pain  Pain from compression fractures  Preparation for the injection:  1. Do not eat any solid food or dairy products within 8 hours of your appointment.  2. You may drink clear liquids up to 3 hours before appointment.  Clear liquids include water, black coffee, juice or soda.  No milk or cream please. 3. You may take your regular medication, including pain medications, with a sip of water before your appointment  Diabetics should hold regular insulin (if taken separately) and take 1/2 normal NPH dos the morning of the procedure.  Carry some sugar containing items with you to your appointment. 4. A driver must accompany you and be prepared to drive you home after your procedure.  5. Bring all your current medications with your. 6. An IV may be inserted and sedation may be given at the discretion  of the physician.   7. A blood pressure cuff, EKG and other monitors will often be applied during the procedure.  Some patients may need to have extra oxygen administered for a short period. 8. You will be asked to provide medical information, including your allergies, prior to the procedure.  We must know immediately if you are taking blood thinners (like Coumadin/Warfarin)  Or if you are allergic to IV iodine contrast (dye). We must know if you could possible be pregnant.  Possible side-effects:  Bleeding from needle site  Infection (rare, may require surgery)  Nerve injury (rare)  Numbness & tingling (temporary)  Difficulty urinating (rare, temporary)  Spinal headache ( a headache worse with upright posture)  Light -headedness (temporary)  Pain at injection site (several days)  Decreased blood pressure (temporary)  Weakness in arm/leg (temporary)  Pressure sensation in back/neck (temporary)  Call if you experience:  Fever/chills associated with  headache or increased back/neck pain.  Headache worsened by an upright position.  New onset weakness or numbness of an extremity below the injection site  Hives or difficulty breathing (go to the emergency room)  Inflammation or drainage at the infection site  Severe back/neck pain  Any new symptoms which are concerning to you  Please note:  Although the local anesthetic injected can often make your back or neck feel good for several hours after the injection, the pain will likely return.  It takes 3-7 days for steroids to work in the epidural space.  You may not notice any pain relief for at least that one week.  If effective, we will often do a series of three injections spaced 3-6 weeks apart to maximally decrease your pain.  After the initial series, we generally will wait several months before considering a repeat injection of the same type.  If you have any questions, please call [phone] [facility]

## 2016-06-19 ENCOUNTER — Ambulatory Visit: Payer: Medicare Other | Admitting: Pain Medicine

## 2016-06-21 ENCOUNTER — Ambulatory Visit: Payer: Medicare Other | Admitting: Pain Medicine

## 2016-06-21 LAB — TOXASSURE SELECT 13 (MW), URINE

## 2016-06-21 NOTE — Progress Notes (Signed)
NOTE: This forensic urine drug screen (UDS) test was conducted using a state-of-the-art ultra high performance liquid chromatography and mass spectrometry system (UPLC/MS-MS), the most sophisticated and accurate method available. UPLC/MS-MS is 1,000 times more precise and accurate than standard gas chromatography and mass spectrometry (GC/MS). This system can analyze 26 drug categories and 180 drug compounds.  Unreported substance: Tramadol  The findings of this UDT were reported as abnormal due to inconsistencies with expected results. An unreported substance was identified in the sample. Expectations were based on the medication history provided by the patient at the time of sample collection.  These results may suggest one of the following possibilities:  1). The use of multiple providers, suggesting the illegal practice of "Doctor Shopping", in violation of [place] Statutes, as well as our medication agreement.  2). The use of unsanctioned and possibly illegal substances, in violation of [place] Statutes, as well as our medication agreement. 3). Inaccurate list of reported substances.

## 2016-06-23 DIAGNOSIS — S52552G Other extraarticular fracture of lower end of left radius, subsequent encounter for closed fracture with delayed healing: Secondary | ICD-10-CM | POA: Diagnosis not present

## 2016-07-06 ENCOUNTER — Ambulatory Visit: Payer: Medicare Other | Admitting: Pain Medicine

## 2016-07-06 DIAGNOSIS — F331 Major depressive disorder, recurrent, moderate: Secondary | ICD-10-CM | POA: Diagnosis not present

## 2016-07-07 DIAGNOSIS — S52552G Other extraarticular fracture of lower end of left radius, subsequent encounter for closed fracture with delayed healing: Secondary | ICD-10-CM | POA: Diagnosis not present

## 2016-07-18 DIAGNOSIS — F331 Major depressive disorder, recurrent, moderate: Secondary | ICD-10-CM | POA: Diagnosis not present

## 2016-07-20 DIAGNOSIS — S52532D Colles' fracture of left radius, subsequent encounter for closed fracture with routine healing: Secondary | ICD-10-CM | POA: Diagnosis not present

## 2016-07-21 DIAGNOSIS — S52532D Colles' fracture of left radius, subsequent encounter for closed fracture with routine healing: Secondary | ICD-10-CM | POA: Diagnosis not present

## 2016-07-25 ENCOUNTER — Ambulatory Visit
Admission: EM | Admit: 2016-07-25 | Discharge: 2016-07-25 | Disposition: A | Discharge status: Home / Self Care | Payer: Medicare Other | Attending: Family Medicine | Admitting: Family Medicine

## 2016-07-25 DIAGNOSIS — M5440 Lumbago with sciatica, unspecified side: Secondary | ICD-10-CM

## 2016-07-25 DIAGNOSIS — G8929 Other chronic pain: Secondary | ICD-10-CM

## 2016-07-25 DIAGNOSIS — G894 Chronic pain syndrome: Secondary | ICD-10-CM

## 2016-07-25 DIAGNOSIS — S52532D Colles' fracture of left radius, subsequent encounter for closed fracture with routine healing: Secondary | ICD-10-CM | POA: Diagnosis not present

## 2016-07-25 DIAGNOSIS — M5441 Lumbago with sciatica, right side: Secondary | ICD-10-CM

## 2016-07-25 DIAGNOSIS — R1013 Epigastric pain: Secondary | ICD-10-CM

## 2016-07-25 DIAGNOSIS — F32A Depression, unspecified: Secondary | ICD-10-CM

## 2016-07-25 DIAGNOSIS — F329 Major depressive disorder, single episode, unspecified: Secondary | ICD-10-CM

## 2016-07-25 DIAGNOSIS — M797 Fibromyalgia: Secondary | ICD-10-CM | POA: Diagnosis not present

## 2016-07-25 MED ORDER — IBUPROFEN 800 MG PO TABS: 800.0000 mg | ORAL_TABLET | Freq: Three times a day (TID) | ORAL | 12 refills | Status: DC | PRN

## 2016-07-25 MED ORDER — RANITIDINE HCL 150 MG PO TABS: 150.0000 mg | ORAL_TABLET | Freq: Two times a day (BID) | ORAL | 12 refills | Status: DC

## 2016-07-25 MED ORDER — TIZANIDINE HCL 4 MG PO TABS: 8.0000 mg | ORAL_TABLET | Freq: Three times a day (TID) | ORAL | 12 refills | Status: DC | PRN

## 2016-07-25 MED ORDER — ESOMEPRAZOLE MAGNESIUM 40 MG PO CPDR: 40.0000 mg | DELAYED_RELEASE_CAPSULE | Freq: Every day | ORAL | 12 refills | Status: DC

## 2016-07-25 NOTE — ED Triage Notes (Signed)
Pt c/o that her fibromyalgia is causing her lots of pain all over, she describes it as if she has the flu and she is aching all over.

## 2016-07-25 NOTE — ED Provider Notes (Signed)
[facility]    CSN: [id number] Arrival date & time: 07/25/16  1509     History   Chief Complaint Chief Complaint  Patient presents with  . Fibromyalgia    HPI April Price is a 61 y.o. female.   Patient is a [age]-year-old white female who seen for number of years because of chronic fibromyalgia and chronic pain. She's had back surgery in the last 5 years as well. She's recently fallen and fractured her left wrist has a plate in it now. That was about a month ago. During this time she's had a difficulty in communicating with her current pain doctor and she has decided to leave his office. Has some concern about this but she states that she's talked to her psychologist who has recommended her to try the [facility] pain clinic. She is also trying to get into a primary care office unfortunately those doctors that she feels comfortable going to or not taking new patients. We talked about that as well. She has been able to come off the tramadol but she is to increase anti-inflammatory. She's taking Motrin 2-3 times a day and she is also taking Aleve over-the-counter as well about 2 tablets. Sebaceous she's taking by 600 mg Motrin and day and 500 mg of Aleve. We normally don't encourage mixed vaccine anti-inflammatories but she is on Zantac and she is on PPI after gastroc tramadol so be it. She still uses at that patch 25 g she has about a month or 2 of the fat no patches and will try to get her into a pain clinic.   The history is provided by the patient. No language interpreter was used.    Past Medical History:  Diagnosis Date  . Allergy   . Anxiety   . Arthritis   . Asthma   . Chronic fatigue   . Depression   . Depression screen   . Diffuse cystic mastopathy   . Fibromyalgia   . Fibromyalgia muscle pain   . GERD (gastroesophageal reflux disease)   . Heart murmur   . History of kidney stones   . Hypertension   . Neuromuscular disorder [facility])     Patient  Active Problem List   Diagnosis Date Noted  . Spasm of back muscles 01/31/2011    Priority: High  . Fibromyalgia 01/12/2011    Priority: High  . Chronic lumbar radicular pain (Left) 04/18/2016  . Vitamin D deficiency 03/07/2016  . Musculoskeletal pain 02/11/2016  . Neurogenic pain 02/11/2016  . Chronic pain syndrome 02/10/2016  . Long term current use of opiate analgesic 02/10/2016  . Long term prescription opiate use 02/10/2016  . Opiate use (80-140 MME/Day) 02/10/2016  . Chronic shoulder pain (Location of Primary Source of Pain) (Bilateral) (L>R) 02/10/2016  . Chronic hip pain (Location of Secondary source of pain) (Bilateral) (L>R) 02/10/2016  . Chronic sacroiliac joint pain (Location of Tertiary source of pain) (Bilateral) (L>R) 02/10/2016  . Chronic low back pain (Bilateral) (L>R) 02/10/2016  . 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 History of lumbar fusion 02/10/2016  . Chronic lower extremity pain (Bilateral) (L>R) 02/10/2016  . Chronic knee pain (Bilateral) (L>R) 02/10/2016  . Chronic elbow pain (Bilateral) (L>R) 02/10/2016  . Chronic hand pain (Bilateral) (L>R) 02/10/2016  . History of fibrocystic disease of breast 01/08/2013  . Dry eyes, bilateral 02/06/2012  . Hypoglycemia 02/06/2012  . Hypertension 02/06/2012  . Chronic fatigue syndrome 02/06/2012  . Osteopenia 02/06/2012  . Vaginal atrophy 02/06/2012  .  Chronic neck pain 01/31/2011  . Depression 01/13/2011  . History of seasonal allergies 01/13/2011  . Situational disturbance 01/13/2011  . Elevated blood pressure 01/13/2011    Past Surgical History:  Procedure Laterality Date  . ABDOMINAL HYSTERECTOMY    . BACK SURGERY     Spinal Fusion  . BREAST BIOPSY Right   . cataract    . CHOLECYSTECTOMY    . COLONOSCOPY  2006  . EYE SURGERY Bilateral 2011   cataract  . NASAL SINUS SURGERY    . NASAL SINUS SURGERY    . OPEN REDUCTION INTERNAL FIXATION (ORIF) DISTAL RADIAL FRACTURE Left 06/05/2016   Procedure:  OPEN REDUCTION INTERNAL FIXATION (ORIF) DISTAL RADIAL FRACTURE;  Surgeon: Earnestine Leys, MD;  Location: [facility] ORS;  Service: Orthopedics;  Laterality: Left;  Hand Innovations needed  . [place]  2003, 2012  . TYMPANOSTOMY TUBE PLACEMENT      OB History    Gravida Para Term Preterm AB Living   0 0 0 0 0 0   SAB TAB Ectopic Multiple Live Births   0 0 0 0        Obstetric Comments   Menstrual age: 67  Age 1st Pregnancy:       Home Medications    Prior to Admission medications   Medication Sig Start Date End Date Taking? Authorizing Provider  albuterol (PROVENTIL HFA;VENTOLIN HFA) 108 (90 Base) MCG/ACT inhaler Inhale 2 puffs into the lungs every 4 (four) hours as needed for wheezing or shortness of breath. 11/25/15  Yes Frederich Cha, MD  cholecalciferol (VITAMIN D) 1000 units tablet Take 1,000 Units by mouth daily.   Yes Historical Provider, MD  EPINEPHrine (EPIPEN 2-PAK) 0.3 mg/0.3 mL IJ SOAJ injection Inject 0.3 mLs (0.3 mg total) into the muscle once. Patient taking differently: Inject 0.3 mg into the muscle daily as needed (allergic reaction).  06/10/15   Frederich Cha, MD  esomeprazole (NEXIUM) 40 MG capsule Take 1 capsule (40 mg total) by mouth daily. 07/25/16   Frederich Cha, MD  fentaNYL (DURAGESIC) 25 MCG/HR patch Place 1 patch (25 mcg total) onto the skin every 3 (three) days. 06/17/16 07/17/16  Milinda Pointer, MD  ibuprofen (ADVIL,MOTRIN) 800 MG tablet Take 1 tablet (800 mg total) by mouth every 8 (eight) hours as needed. 07/25/16   Frederich Cha, MD  naloxone [facility]) 2 MG/2ML injection Inject content of syringe into thigh muscle. Call 911. 04/18/16   Milinda Pointer, MD  ranitidine (ZANTAC) 150 MG tablet Take 1 tablet (150 mg total) by mouth 2 (two) times daily. 07/25/16   Frederich Cha, MD  tiZANidine (ZANAFLEX) 4 MG tablet Take 2 tablets (8 mg total) by mouth every 8 (eight) hours as needed for muscle spasms. 07/25/16 08/24/16  Frederich Cha, MD    Family History Family History    Problem Relation Age of Onset  . Heart disease Mother   . Heart disease Father   . Vascular Disease Sister   . Heart disease Brother     Social History Social History  Substance Use Topics  . Smoking status: Current Every Day Smoker    Packs/day: 0.50    Years: 30.00    Types: Cigarettes  . Smokeless tobacco: Never Used  . Alcohol use No     Allergies   Gabapentin; Penicillins; Tylenol [acetaminophen]; Morphine and related; Nickel; Pepcid [famotidine]; Shrimp [shellfish allergy]; Tape; and Tetanus toxoids   Review of Systems Review of Systems  Musculoskeletal: Positive for back pain and myalgias.  All other  systems reviewed and are negative.    Physical Exam Triage Vital Signs ED Triage Vitals  Enc Vitals Group     BP 07/25/16 1540 (!) 161/82     Pulse Rate 07/25/16 1540 82     Resp 07/25/16 1540 18     Temp 07/25/16 1540 98 F (36.7 C)     Temp Source 07/25/16 1540 Oral     SpO2 07/25/16 1540 98 %     Weight 07/25/16 1541 165 lb (74.8 kg)     Height 07/25/16 1541 5\' 4"  (1.626 m)     Head Circumference --      Peak Flow --      Pain Score 07/25/16 1541 5     Pain Loc --      Pain Edu? --      Excl. in [place]? --    No data found.   Updated Vital Signs BP (!) 161/82 (BP Location: Left Arm)   Pulse 82   Temp 98 F (36.7 C) (Oral)   Resp 18   Ht 5\' 4"  (1.626 m)   Wt 165 lb (74.8 kg)   SpO2 98%   BMI 28.32 kg/m   Visual Acuity Right Eye Distance:   Left Eye Distance:   Bilateral Distance:    Right Eye Near:   Left Eye Near:    Bilateral Near:     Physical Exam  Constitutional: She is oriented to person, place, and time. She appears well-developed and well-nourished.  HENT:  Head: Normocephalic and atraumatic.  Right Ear: External ear normal.  Left Ear: External ear normal.  Eyes: Pupils are equal, round, and reactive to light.  Neck: Normal range of motion. Neck supple.  Cardiovascular: Normal rate, regular rhythm and normal heart sounds.    Pulmonary/Chest: Effort normal and breath sounds normal.  Musculoskeletal: She exhibits tenderness.  Neurological: She is alert and oriented to person, place, and time.  Skin: Skin is warm.  Psychiatric: She has a normal mood and affect.  Vitals reviewed.    UC Treatments / Results  Labs (all labs ordered are listed, but only abnormal results are displayed) Labs Reviewed - No data to display  EKG  EKG Interpretation None       Radiology No results found.  Procedures Procedures (including critical care time)  Medications Ordered in UC Medications - No data to display   Initial Impression / Assessment and Plan / UC Course  I have reviewed the triage vital signs and the nursing notes.  Pertinent labs & imaging results that were available during my care of the patient were reviewed by me and considered in my medical decision making (see chart for details).    We'll draw best to get patient into the pain clinic she progressed through her psychologist Dr. Norwood Levo which is [facility] [place] working to get her into primary care office of recommend she continue to try primary care offices outside of [facility] since he prefers at [facility] or [facility] system primary care office. Will renew her Zantac to Nexium Zanaflex and Motrin.   Final Clinical Impressions(s) / UC Diagnoses   Final diagnoses:  Fibromyalgia muscle pain  Chronic midline low back pain with sciatica, sciatica laterality unspecified  Dyspepsia  Depression, unspecified depression type  Chronic pain syndrome    New Prescriptions Discharge Medication List as of 07/25/2016  6:41 PM       Note: This dictation was prepared with Dragon dictation along with smaller phrase technology.  Any transcriptional errors that result from this process are unintentional.   Frederich Cha, MD 07/25/16 [phone]

## 2016-08-01 DIAGNOSIS — S52532D Colles' fracture of left radius, subsequent encounter for closed fracture with routine healing: Secondary | ICD-10-CM | POA: Diagnosis not present

## 2016-08-07 ENCOUNTER — Telehealth: Payer: Self-pay | Admitting: *Deleted

## 2016-08-07 NOTE — Telephone Encounter (Signed)
Patient called reporting that her medications prescribed by Dr Alveta Heimlich had not been received on 07/26/16. I called pharmacy on record and they confirmed that the prescriptions had been received and have been available for pick up since 07/26/16. Called patient and informed her that the prescriptions are available for pick up at her pharmacy on record. Patient confirmed understanding of information.

## 2016-08-15 DIAGNOSIS — F331 Major depressive disorder, recurrent, moderate: Secondary | ICD-10-CM | POA: Diagnosis not present

## 2016-08-18 DIAGNOSIS — S52532D Colles' fracture of left radius, subsequent encounter for closed fracture with routine healing: Secondary | ICD-10-CM | POA: Diagnosis not present

## 2016-08-31 DIAGNOSIS — F331 Major depressive disorder, recurrent, moderate: Secondary | ICD-10-CM | POA: Diagnosis not present

## 2016-09-14 DIAGNOSIS — F331 Major depressive disorder, recurrent, moderate: Secondary | ICD-10-CM | POA: Diagnosis not present

## 2016-10-04 DIAGNOSIS — F331 Major depressive disorder, recurrent, moderate: Secondary | ICD-10-CM | POA: Diagnosis not present

## 2016-12-06 ENCOUNTER — Ambulatory Visit
Admission: EM | Admit: 2016-12-06 | Discharge: 2016-12-06 | Disposition: A | Discharge status: Home / Self Care | Payer: Medicare Other | Attending: Family Medicine | Admitting: Family Medicine

## 2016-12-06 ENCOUNTER — Encounter: Payer: Self-pay | Admitting: Emergency Medicine

## 2016-12-06 DIAGNOSIS — Z8739 Personal history of other diseases of the musculoskeletal system and connective tissue: Secondary | ICD-10-CM

## 2016-12-06 DIAGNOSIS — G894 Chronic pain syndrome: Secondary | ICD-10-CM | POA: Diagnosis not present

## 2016-12-06 MED ORDER — ORPHENADRINE CITRATE ER 100 MG PO TB12: 100.0000 mg | ORAL_TABLET | Freq: Two times a day (BID) | ORAL | 0 refills | Status: DC

## 2016-12-06 MED ORDER — CELECOXIB 400 MG PO CAPS: 400.0000 mg | ORAL_CAPSULE | Freq: Every day | ORAL | 0 refills | Status: DC

## 2016-12-06 MED ORDER — KETOROLAC TROMETHAMINE 60 MG/2ML IM SOLN
60.0000 mg | Freq: Once | INTRAMUSCULAR | Status: AC
  Administered 2016-12-06: 60 mg via INTRAMUSCULAR

## 2016-12-06 MED ORDER — TRAMADOL HCL 50 MG PO TABS: 50.0000 mg | ORAL_TABLET | Freq: Three times a day (TID) | ORAL | 0 refills | Status: AC | PRN

## 2016-12-06 MED ORDER — PREDNISONE 10 MG (21) PO TBPK: ORAL_TABLET | ORAL | 1 refills | Status: DC

## 2016-12-06 MED ORDER — GABAPENTIN 300 MG PO CAPS: 300.0000 mg | ORAL_CAPSULE | Freq: Three times a day (TID) | ORAL | 0 refills | Status: DC

## 2016-12-06 MED ORDER — METHYLPREDNISOLONE SODIUM SUCC 125 MG IJ SOLR
125.0000 mg | Freq: Once | INTRAMUSCULAR | Status: AC
  Administered 2016-12-06: 125 mg via INTRAMUSCULAR

## 2016-12-06 MED ORDER — GABAPENTIN 100 MG PO CAPS: 100.0000 mg | ORAL_CAPSULE | Freq: Two times a day (BID) | ORAL | 0 refills | Status: DC

## 2016-12-06 NOTE — ED Triage Notes (Signed)
Patient c/o chronic fibromyalgia pain that started getting worse last week.  Patient c/ pain all over her body.

## 2016-12-06 NOTE — ED Provider Notes (Signed)
[facility]    CSN: [id number] Arrival date & time: 12/06/16  1028     History   Chief Complaint Chief Complaint  Patient presents with  . Fibromyalgia    HPI April Price is a 61 y.o. female.   April Price is a 61 year old white female who I'm well familiar with been seeing her for over 20 years. During this time would treat her for chronic pain and fibromyalgia. Due to the limitations of prescribing pain medication through the urgent care with a new guidelines medical board with try to get her into a pain clinic and a fine a PCP. The difficulty has been a lot of primary care doctor's reluctant to take people who require narcotics or controlled substances for pain. Getting into a pain doctor is also not been that successful since 1 week did get her into see she states wanted to do injections which she was sitting against in to do that initially so she was terminated from the practice. She has an appointment with the [facility] pain clinic but she still the waiting list. She has appointment with a nurse practitioner at [facility]  in about 2 weeks. Since then she's been running extremely low on her pain medication having a lot of pain in a lot of discomfort. She states that she is going to try some other modalities she's here about Celebrex but explained to her that I'm concerned about using Celebrex since she also likes to use Motrin and Naprosyn as well for pain. Gabapentin she was on 300 mg which he felt was too strong made her feel like she was coming out of anesthesia. She is going to try gabapentin again though at a lower dose and see if she can tolerate the increased dosage. She's had persistent and recurrent muscle spasm which has not been helped with the Zanaflex.   The history is provided by the patient. No language interpreter was used.    Past Medical History:  Diagnosis Date  . Allergy   . Anxiety   . Arthritis   . Asthma   . Chronic fatigue   .  Depression   . Depression screen   . Diffuse cystic mastopathy   . Fibromyalgia   . Fibromyalgia muscle pain   . GERD (gastroesophageal reflux disease)   . Heart murmur   . History of kidney stones   . Hypertension   . Neuromuscular disorder [facility])     Patient Active Problem List   Diagnosis Date Noted  . Spasm of back muscles 01/31/2011    Priority: High  . Fibromyalgia 01/12/2011    Priority: High  . Chronic lumbar radicular pain (Left) 04/18/2016  . Vitamin D deficiency 03/07/2016  . Musculoskeletal pain 02/11/2016  . Neurogenic pain 02/11/2016  . Chronic pain syndrome 02/10/2016  . Long term current use of opiate analgesic 02/10/2016  . Long term prescription opiate use 02/10/2016  . Opiate use (80-140 MME/Day) 02/10/2016  . Chronic shoulder pain (Location of Primary Source of Pain) (Bilateral) (L>R) 02/10/2016  . Chronic hip pain (Location of Secondary source of pain) (Bilateral) (L>R) 02/10/2016  . Chronic sacroiliac joint pain (Location of Tertiary source of pain) (Bilateral) (L>R) 02/10/2016  . Chronic low back pain (Bilateral) (L>R) 02/10/2016  . 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 History of lumbar fusion 02/10/2016  . Chronic lower extremity pain (Bilateral) (L>R) 02/10/2016  . Chronic knee pain (Bilateral) (L>R) 02/10/2016  . Chronic elbow pain (Bilateral) (L>R) 02/10/2016  .  Chronic hand pain (Bilateral) (L>R) 02/10/2016  . History of fibrocystic disease of breast 01/08/2013  . Dry eyes, bilateral 02/06/2012  . Hypoglycemia 02/06/2012  . Hypertension 02/06/2012  . Chronic fatigue syndrome 02/06/2012  . Osteopenia 02/06/2012  . Vaginal atrophy 02/06/2012  . Chronic neck pain 01/31/2011  . Depression 01/13/2011  . History of seasonal allergies 01/13/2011  . Situational disturbance 01/13/2011  . Elevated blood pressure 01/13/2011    Past Surgical History:  Procedure Laterality Date  . ABDOMINAL HYSTERECTOMY    . BACK SURGERY     Spinal Fusion  .  BREAST BIOPSY Right   . cataract    . CHOLECYSTECTOMY    . COLONOSCOPY  2006  . EYE SURGERY Bilateral 2011   cataract  . NASAL SINUS SURGERY    . NASAL SINUS SURGERY    . OPEN REDUCTION INTERNAL FIXATION (ORIF) DISTAL RADIAL FRACTURE Left 06/05/2016   Procedure: OPEN REDUCTION INTERNAL FIXATION (ORIF) DISTAL RADIAL FRACTURE;  Surgeon: Earnestine Leys, MD;  Location: [facility] ORS;  Service: Orthopedics;  Laterality: Left;  Hand Innovations needed  . [place]  2003, 2012  . TYMPANOSTOMY TUBE PLACEMENT      OB History    Gravida Para Term Preterm AB Living   0 0 0 0 0 0   SAB TAB Ectopic Multiple Live Births   0 0 0 0        Obstetric Comments   Menstrual age: 31  Age 1st Pregnancy:       Home Medications    Prior to Admission medications   Medication Sig Start Date End Date Taking? Authorizing Provider  albuterol (PROVENTIL HFA;VENTOLIN HFA) 108 (90 Base) MCG/ACT inhaler Inhale 2 puffs into the lungs every 4 (four) hours as needed for wheezing or shortness of breath. 11/25/15   Frederich Cha, MD  celecoxib (CELEBREX) 400 MG capsule Take 1 capsule (400 mg total) by mouth daily after breakfast. Do not take with Motrin or with Naprosyn 12/06/16   Frederich Cha, MD  celecoxib (CELEBREX) 400 MG capsule Take 1 capsule (400 mg total) by mouth daily after breakfast. .Take Motrin or Naprosyn90 01/06/17   Frederich Cha, MD  cholecalciferol (VITAMIN D) 1000 units tablet Take 1,000 Units by mouth daily.    [provider]  EPINEPHrine (EPIPEN 2-PAK) 0.3 mg/0.3 mL IJ SOAJ injection Inject 0.3 mLs (0.3 mg total) into the muscle once. Patient taking differently: Inject 0.3 mg into the muscle daily as needed (allergic reaction).  06/10/15   Frederich Cha, MD  esomeprazole (NEXIUM) 40 MG capsule Take 1 capsule (40 mg total) by mouth daily. 07/25/16   Frederich Cha, MD  fentaNYL (DURAGESIC) 25 MCG/HR patch Place 1 patch (25 mcg total) onto the skin every 3 (three) days. 06/17/16 07/17/16  Milinda Pointer, MD  gabapentin (NEURONTIN) 100 MG capsule Take 1 capsule (100 mg total) by mouth 2 (two) times daily. 12/06/16   Frederich Cha, MD  gabapentin (NEURONTIN) 300 MG capsule Take 1 capsule (300 mg total) by mouth 3 (three) times daily. If 100 mg is tolerated may go up to 300 mg twice a day 12/06/16   Frederich Cha, MD  ibuprofen (ADVIL,MOTRIN) 800 MG tablet Take 1 tablet (800 mg total) by mouth every 8 (eight) hours as needed. 07/25/16   Frederich Cha, MD  naloxone [facility]) 2 MG/2ML injection Inject content of syringe into thigh muscle. Call 911. 04/18/16   Milinda Pointer, MD  orphenadrine (NORFLEX) 100 MG tablet Take 1 tablet (100 mg total) by  mouth 2 (two) times daily. Do not take with Zanaflex 12/06/16   Frederich Cha, MD  orphenadrine (NORFLEX) 100 MG tablet Take 1 tablet (100 mg total) by mouth 2 (two) times daily. Do not take with Zanaflex 01/06/17   Frederich Cha, MD  predniSONE (STERAPRED UNI-PAK 21 TAB) 10 MG (21) TBPK tablet 6 tabs day 1 and 2, 5 tabs day 3 and 4, 4 tabs day 5 and 6, 3 tabs day 7 and 8, 2 tabs day 9 and 10, 1 tab day 11 and 12. Take orally 12/06/16   Frederich Cha, MD  ranitidine (ZANTAC) 150 MG tablet Take 1 tablet (150 mg total) by mouth 2 (two) times daily. 07/25/16   Frederich Cha, MD  tiZANidine (ZANAFLEX) 4 MG tablet Take 2 tablets (8 mg total) by mouth every 8 (eight) hours as needed for muscle spasms. 07/25/16 08/24/16  Frederich Cha, MD  traMADol (ULTRAM) 50 MG tablet Take 1-2 tablets (50-100 mg total) by mouth 3 (three) times daily as needed. 12/06/16 12/11/16  Frederich Cha, MD    Family History Family History  Problem Relation Age of Onset  . Heart disease Mother   . Heart disease Father   . Vascular Disease Sister   . Heart disease Brother     Social History Social History  Substance Use Topics  . Smoking status: Current Every Day Smoker    Packs/day: 0.50    Years: 30.00    Types: Cigarettes  . Smokeless tobacco: Never Used  . Alcohol use No      Allergies   Gabapentin; Penicillins; Tylenol [acetaminophen]; Morphine and related; Nickel; Pepcid [famotidine]; Shrimp [shellfish allergy]; Tape; and Tetanus toxoids   Review of Systems Review of Systems  Constitutional: Positive for activity change.  Musculoskeletal: Positive for arthralgias and myalgias.  All other systems reviewed and are negative.    Physical Exam Triage Vital Signs ED Triage Vitals  Enc Vitals Group     BP 12/06/16 1110 (S) (!) 178/90     Pulse Rate 12/06/16 1110 96     Resp 12/06/16 1110 16     Temp 12/06/16 1110 (!) 97.5 F (36.4 C)     Temp Source 12/06/16 1110 Oral     SpO2 12/06/16 1110 97 %     Weight 12/06/16 1108 170 lb (77.1 kg)     Height 12/06/16 1108 5\' 4"  (1.626 m)     Head Circumference --      Peak Flow --      Pain Score 12/06/16 1108 10     Pain Loc --      Pain Edu? --      Excl. in [place]? --    No data found.   Updated Vital Signs BP (S) (!) 178/90 (BP Location: Right Arm)   Pulse 96   Temp (!) 97.5 F (36.4 C) (Oral)   Resp 16   Ht 5\' 4"  (1.626 m)   Wt 170 lb (77.1 kg)   SpO2 97%   BMI 29.18 kg/m   Visual Acuity Right Eye Distance:   Left Eye Distance:   Bilateral Distance:    Right Eye Near:   Left Eye Near:    Bilateral Near:     Physical Exam  Constitutional: She is oriented to person, place, and time. She appears well-developed and well-nourished.  HENT:  Head: Normocephalic and atraumatic.  Right Ear: External ear normal.  Left Ear: External ear normal.  Eyes: Pupils are equal, round, and reactive to light.  Neck: Normal range of motion. Neck supple.  Cardiovascular: Normal rate and regular rhythm.   Pulmonary/Chest: Effort normal and breath sounds normal.  Musculoskeletal: Normal range of motion.  Neurological: She is alert and oriented to person, place, and time.  Skin: Skin is warm.  Psychiatric: She has a normal mood and affect.  Vitals reviewed.    UC Treatments / Results  Labs (all  labs ordered are listed, but only abnormal results are displayed) Labs Reviewed - No data to display  EKG  EKG Interpretation None       Radiology No results found.  Procedures Procedures (including critical care time)  Medications Ordered in UC Medications  ketorolac (TORADOL) injection 60 mg (60 mg Intramuscular Given 12/06/16 1217)  methylPREDNISolone sodium succinate (SOLU-MEDROL) 125 mg/2 mL injection 125 mg (125 mg Intramuscular Given 12/06/16 1218)     Initial Impression / Assessment and Plan / UC Course  I have reviewed the triage vital signs and the nursing notes.  Pertinent labs & imaging results that were available during my care of the patient were reviewed by me and considered in my medical decision making (see chart for details).    Will try several things will try Celebrex 40 mg but with clear instructions to not take this Motrin and if after a month this works I can give her 1 more prescription for 90 days. For the Norflex will try that said the Zanaflex once again no Zanaflex with this medication 1 tablet twice a day for the first month and then a 90 day prescription. I warned her that her Medicare may not pay for either one of these she may have to just half pocket try to see if there is some type of discount card or plan on at this prescriptions if she doesn't try to use her Medicare to pay for it. These options and she'll need to pursue but if this helps great. We'll going to put on gabapentin low-dose 100 mg 1 capsule or tablet twice a day for about 10 days if she tolerates this then we can have her go up to the 300 twice a day or 300 once a day. Will renew her tramadol for 5 days and give her since the maximum dose is 2 tablets 3 times a day based 30 tablets as a bridge until she can see her primary care provider.  Final Clinical Impressions(s) / UC Diagnoses   Final diagnoses:  Personal history of fibromyalgia  Chronic pain syndrome    New  Prescriptions Discharge Medication List as of 12/06/2016 12:32 PM    START taking these medications   Details  !! celecoxib (CELEBREX) 400 MG capsule Take 1 capsule (400 mg total) by mouth daily after breakfast. Do not take with Motrin or with Naprosyn, Starting Wed 12/06/2016, Normal    !! celecoxib (CELEBREX) 400 MG capsule Take 1 capsule (400 mg total) by mouth daily after breakfast. .Take Motrin or Naprosyn90, Starting Sat 01/06/2017, Normal    !! gabapentin (NEURONTIN) 100 MG capsule Take 1 capsule (100 mg total) by mouth 2 (two) times daily., Starting Wed 12/06/2016, Normal    !! gabapentin (NEURONTIN) 300 MG capsule Take 1 capsule (300 mg total) by mouth 3 (three) times daily. If 100 mg is tolerated may go up to 300 mg twice a day, Starting Wed 12/06/2016, Normal    !! orphenadrine (NORFLEX) 100 MG tablet Take 1 tablet (100 mg total) by mouth 2 (two) times daily. Do not take with Zanaflex, Starting  Wed 12/06/2016, Normal    !! orphenadrine (NORFLEX) 100 MG tablet Take 1 tablet (100 mg total) by mouth 2 (two) times daily. Do not take with Zanaflex, Starting Sat 01/06/2017, Normal    predniSONE (STERAPRED UNI-PAK 21 TAB) 10 MG (21) TBPK tablet 6 tabs day 1 and 2, 5 tabs day 3 and 4, 4 tabs day 5 and 6, 3 tabs day 7 and 8, 2 tabs day 9 and 10, 1 tab day 11 and 12. Take orally, Normal    traMADol (ULTRAM) 50 MG tablet Take 1-2 tablets (50-100 mg total) by mouth 3 (three) times daily as needed., Starting Wed 12/06/2016, Until Mon 12/11/2016, Normal     !! - Potential duplicate medications found. Please discuss with provider.      Note: This dictation was prepared with Dragon dictation along with smaller phrase technology. Any transcriptional errors that result from this process are unintentional.  Controlled Substance Prescriptions [place] Controlled Substance Registry consulted? Patient was looked up at the [facility] drug reporting site and her prescription medication was consistent with her  history that I'm aware   Frederich Cha, MD 12/06/16 1320

## 2016-12-06 NOTE — ED Notes (Signed)
Patient shows no signs of adverse reaction to medication at this time.  

## 2016-12-25 ENCOUNTER — Ambulatory Visit (INDEPENDENT_AMBULATORY_CARE_PROVIDER_SITE_OTHER): Payer: Medicare Other | Admitting: Nurse Practitioner

## 2016-12-25 ENCOUNTER — Encounter: Payer: Self-pay | Admitting: Nurse Practitioner

## 2016-12-25 VITALS — BP 150/64 | HR 79 | Temp 98.9°F | Ht 64.0 in | Wt 172.8 lb

## 2016-12-25 DIAGNOSIS — Z1231 Encounter for screening mammogram for malignant neoplasm of breast: Secondary | ICD-10-CM

## 2016-12-25 DIAGNOSIS — Z1239 Encounter for other screening for malignant neoplasm of breast: Secondary | ICD-10-CM

## 2016-12-25 DIAGNOSIS — G894 Chronic pain syndrome: Secondary | ICD-10-CM | POA: Diagnosis not present

## 2016-12-25 DIAGNOSIS — Z1211 Encounter for screening for malignant neoplasm of colon: Secondary | ICD-10-CM

## 2016-12-25 DIAGNOSIS — Z780 Asymptomatic menopausal state: Secondary | ICD-10-CM

## 2016-12-25 DIAGNOSIS — M797 Fibromyalgia: Secondary | ICD-10-CM | POA: Diagnosis not present

## 2016-12-25 MED ORDER — GABAPENTIN 100 MG PO CAPS: 300.0000 mg | ORAL_CAPSULE | Freq: Every day | ORAL | 2 refills | Status: DC

## 2016-12-25 MED ORDER — CELECOXIB 400 MG PO CAPS: 400.0000 mg | ORAL_CAPSULE | Freq: Every day | ORAL | 0 refills | Status: DC

## 2016-12-25 NOTE — Patient Instructions (Addendum)
April Price, Thank you for coming in to clinic today.  1. Continue Celebrex 400 mg once daily.  2. For your gabapentin: take three 100 mg tablets for 300 mg total at bedtime.  We will consider increasing to 400 mg total at your next visit if needed.  3. Continue your exercises for your muscles that you have learned at PT.  Please schedule a follow-up appointment with Cassell Smiles, AGNP. Return in about 3 months (around 03/26/2017) for fibromyalgia, arthritis, GERD.  If you have any other questions or concerns, please feel free to call the clinic or send a message through [place]. You may also schedule an earlier appointment if necessary.  You will receive a survey after today's visit either digitally by e-mail or paper by [employer]. Your experiences and feedback matter to [place].  Please respond so we know how we are doing as we provide care for you.   Cassell Smiles, DNP, AGNP-BC Adult Gerontology Nurse Practitioner [place]

## 2016-12-25 NOTE — Progress Notes (Signed)
Subjective:    Patient ID: April Price, female    DOB: 08-29-1955, 61 y.o.   MRN: [id number]  April Price is a 61 y.o. female presenting on 12/25/2016 for [place] Provider Pt last seen by PCP Dr. Alveta Heimlich 1 month ago.  Obtain records from [facility].   Fibromyalgia   Pt has struggled w/ fibromyalgia for many years and is currently not well controlled. Pain clinic: Didn't want nerve block - and will not go back to Dr. Currie Paris.  Waiting list pain clinic [facility].  - Currently having more flares and flares are worse.  Has had to have prednisone in past. - Had been on same dose of tramadol and fentanyl patch x 50 years.  This helps daily functioning. - Currently unable to clean, cook w/o pain.  Takes a long time because has to stop and rest.  Uses heating pad more during rainy days. Meds: - Gabapentin: Can't function x 3-4 hours during day after taking.  Has been taking at night, but wears off after 12 hours. - Celebrex: helps arthritis and "not so much" for the fibromyalgia. 400 mg once daily. - Lyrica - tried and leg and feet swell. - Last fentanyl patch 1 month ago. - Still taking tramadol some days, but is running out - Is also taking tazanidine - w/ planned change to norflex 100 mg bid to start on 9/29  Depression  - Psychologist: Dr. Later - Has not been recently, but states needs a new referral to different provider.  Depression screen [facility] 2/9 06/15/2016 04/18/2016 02/10/2016  Decreased Interest 0 0 0  Down, Depressed, Hopeless 0 0 0  PHQ - 2 Score 0 0 0    Past Medical History:  Diagnosis Date  . Allergy   . Anxiety   . Arthritis   . Asthma   . Chronic fatigue   . Depression   . Depression screen   . Diffuse cystic mastopathy   . Fibromyalgia   . Fibromyalgia muscle pain   . GERD (gastroesophageal reflux disease)   . Heart murmur   . History of kidney stones   . Hypertension    driven by stress and pain    Past Surgical History:  Procedure Laterality Date  . ABDOMINAL HYSTERECTOMY    . BACK SURGERY     Spinal Fusion  . BREAST BIOPSY Right   . cataract    . CHOLECYSTECTOMY    . COLONOSCOPY  2006  . EYE SURGERY Bilateral 2011   cataract  . NASAL SINUS SURGERY    . NASAL SINUS SURGERY    . OPEN REDUCTION INTERNAL FIXATION (ORIF) DISTAL RADIAL FRACTURE Left 06/05/2016   Procedure: OPEN REDUCTION INTERNAL FIXATION (ORIF) DISTAL RADIAL FRACTURE;  Surgeon: Earnestine Leys, MD;  Location: [facility] ORS;  Service: Orthopedics;  Laterality: Left;  Hand Innovations needed  . [place]  2003, 2012  . TYMPANOSTOMY TUBE PLACEMENT     Social History   Social History  . Marital status: Single    Spouse name: N/A  . Number of children: N/A  . Years of education: N/A   Occupational History  . Not on file.   Social History Main Topics  . Smoking status: Current Every Day Smoker    Packs/day: 0.75    Years: 40.00    Types: Cigarettes  . Smokeless tobacco: Never Used  . Alcohol use No  . Drug use: No  . Sexual activity: Not Currently  Other Topics Concern  . Not on file   Social History Narrative  . No narrative on file   Family History  Problem Relation Age of Onset  . Heart disease Mother   . Heart disease Father   . Stroke Father   . Vascular Disease Sister   . Heart disease Brother    Current Outpatient Prescriptions on File Prior to Visit  Medication Sig  . albuterol (PROVENTIL HFA;VENTOLIN HFA) 108 (90 Base) MCG/ACT inhaler Inhale 2 puffs into the lungs every 4 (four) hours as needed for wheezing or shortness of breath.  . cholecalciferol (VITAMIN D) 1000 units tablet Take 1,000 Units by mouth daily.  Marland Kitchen esomeprazole (NEXIUM) 40 MG capsule Take 1 capsule (40 mg total) by mouth daily.  . naloxone (NARCAN) 2 MG/2ML injection Inject content of syringe into thigh muscle. Call 911.  . ranitidine (ZANTAC) 150 MG tablet Take 1 tablet (150 mg total) by mouth 2 (two) times daily.  Derrill Memo ON 01/06/2017] celecoxib (CELEBREX) 400 MG capsule Take 1 capsule (400 mg total) by mouth daily after breakfast. .Take Motrin or Naprosyn90 (Patient not taking: Reported on 12/25/2016)  . EPINEPHrine (EPIPEN 2-PAK) 0.3 mg/0.3 mL IJ SOAJ injection Inject 0.3 mLs (0.3 mg total) into the muscle once. (Patient not taking: Reported on 12/25/2016)  . [START ON 01/06/2017] orphenadrine (NORFLEX) 100 MG tablet Take 1 tablet (100 mg total) by mouth 2 (two) times daily. Do not take with Zanaflex (Patient not taking: Reported on 12/25/2016)  . predniSONE (STERAPRED UNI-PAK 21 TAB) 10 MG (21) TBPK tablet 6 tabs day 1 and 2, 5 tabs day 3 and 4, 4 tabs day 5 and 6, 3 tabs day 7 and 8, 2 tabs day 9 and 10, 1 tab day 11 and 12. Take orally (Patient not taking: Reported on 12/25/2016)   No current facility-administered medications on file prior to visit.     Review of Systems Per HPI unless specifically indicated above     Objective:    BP (!) 150/64 (BP Location: Right Arm, Patient Position: Sitting, Cuff Size: Normal)   Pulse 79   Temp 98.9 F (37.2 C) (Oral)   Ht 5\' 4"  (1.626 m)   Wt 172 lb 12.8 oz (78.4 kg)   SpO2 96%   BMI 29.66 kg/m    Wt Readings from Last 3 Encounters:  12/25/16 172 lb 12.8 oz (78.4 kg)  12/06/16 170 lb (77.1 kg)  07/25/16 165 lb (74.8 kg)    Physical Exam  General - Obese, frail, well-appearing, NAD HEENT - Normocephalic, atraumatic, PERRL, EOMI, patent nares w/o congestion, oropharynx clear, MMM pale, very poor dentition w/ numerous broken teeth/caries.  Some missing teeth. Neck - supple, non-tender, no LAD, no carotid bruit Heart - RRR, 1/6 systolic murmur heard Lungs - Clear throughout all lobes, no wheezing, crackles, or rhonchi. Normal work of breathing. Extremeties - non-tender, no edema, cap refill < 2 seconds, peripheral pulses intact +2 bilaterally, bouchard's nodes Skin - warm, dry, no rashes Neuro - awake, alert, oriented x3, positive romberg, wide antalgic  gait. Psych - Normal mood and affect, normal behavior    Results for orders placed or performed in visit on 06/15/16  ToxASSURE Select 13 (MW), Urine  Result Value Ref Range   ToxAssure Select 13 FINAL       Assessment & Plan:   Problem List Items Addressed This Visit      Other   Fibromyalgia (Chronic)    Pain not currently well controlled,  but patient is tolerating for now.  Is on waiting list for new pain management provider to accept new patients.  Plan: 1. Continue Celebrex 400 mg once daily. 2. Increase Gabapentin: take three 100 mg tablets for 300 mg total at bedtime.  We will consider increasing to 400 mg total next visit if needed. 3. Continue your exercises learned at PT.  Recommend repeat visit to PT, but patient declines. 4. Reviewed no chronic opioids from this clinic. 5. Follow up as needed and in 3 months.      Relevant Medications   gabapentin (NEURONTIN) 100 MG capsule   celecoxib (CELEBREX) 400 MG capsule   Chronic pain syndrome (Chronic)    See A/P fibromyalgia.      Relevant Medications   gabapentin (NEURONTIN) 100 MG capsule   celecoxib (CELEBREX) 400 MG capsule    Other Visit Diagnoses    Colon cancer screening    -  Primary Pt requiring colon cancer screening.  No family history of colon cancer.  Plan: - Discussed timing for initiation of colon cancer screening ACS vs USPSTF guidelines - Mutual decision making discussion for options of colonoscopy vs cologuard.  Pt prefers cologuard. - Ordered Cologuard today   Relevant Orders   Cologuard   Breast cancer screening     Pt last mammogram > 2 years ago.  Result normal per pt.  Plan: 1. Screening mammogram order placed.  Pt will call to schedule appointment.  Information given.   Relevant Orders   MM DIGITAL SCREENING BILATERAL   Asymptomatic postmenopausal estrogen deficiency     No recent bone density scan, likely > 5 years per pt, and diagnosis osteopenia in past.  Pt cannot recall where it  was previously performed.  Plan: 1. Obtain DG bone density.     Relevant Orders   DG Bone Density      Meds ordered this encounter  Medications  . DISCONTD: tiZANidine (ZANAFLEX) 4 MG tablet  . gabapentin (NEURONTIN) 100 MG capsule    Sig: Take 3 capsules (300 mg total) by mouth at bedtime.    Dispense:  30 capsule    Refill:  2  . celecoxib (CELEBREX) 400 MG capsule    Sig: Take 1 capsule (400 mg total) by mouth daily after breakfast. Do not take with Motrin or with Naprosyn    Dispense:  30 capsule    Refill:  0      Follow up plan: Return in about 3 months (around 03/26/2017) for fibromyalgia, arthritis, GERD.  Cassell Smiles, DNP, AGPCNP-BC Adult Gerontology Primary Care Nurse Practitioner [facility] 12/28/2016, 4:08 PM

## 2016-12-28 ENCOUNTER — Encounter: Payer: Self-pay | Admitting: Nurse Practitioner

## 2016-12-28 NOTE — Assessment & Plan Note (Signed)
See A/P fibromyalgia.

## 2016-12-28 NOTE — Assessment & Plan Note (Addendum)
Pain not currently well controlled, but patient is tolerating for now.  Is on waiting list for new pain management provider to accept new patients.  Plan: 1. Continue Celebrex 400 mg once daily. 2. Increase Gabapentin: take three 100 mg tablets for 300 mg total at bedtime.  We will consider increasing to 400 mg total next visit if needed. 3. Continue your exercises learned at PT.  Recommend repeat visit to PT, but patient declines. 4. Reviewed no chronic opioids from this clinic. 5. Follow up as needed and in 3 months.

## 2017-01-03 DIAGNOSIS — F4320 Adjustment disorder, unspecified: Secondary | ICD-10-CM | POA: Diagnosis not present

## 2017-02-06 ENCOUNTER — Ambulatory Visit: Payer: Medicare Other

## 2017-02-07 DIAGNOSIS — F4320 Adjustment disorder, unspecified: Secondary | ICD-10-CM | POA: Diagnosis not present

## 2017-02-13 ENCOUNTER — Encounter: Payer: Self-pay | Admitting: Nurse Practitioner

## 2017-02-13 ENCOUNTER — Ambulatory Visit (INDEPENDENT_AMBULATORY_CARE_PROVIDER_SITE_OTHER): Payer: Medicare Other

## 2017-02-13 ENCOUNTER — Ambulatory Visit (INDEPENDENT_AMBULATORY_CARE_PROVIDER_SITE_OTHER): Payer: Medicare Other | Admitting: Nurse Practitioner

## 2017-02-13 VITALS — BP 170/98 | HR 90 | Temp 98.3°F | Resp 16 | Ht 64.0 in | Wt 173.8 lb

## 2017-02-13 VITALS — BP 160/94 | HR 86 | Resp 16 | Ht 64.0 in | Wt 173.0 lb

## 2017-02-13 DIAGNOSIS — Z0000 Encounter for general adult medical examination without abnormal findings: Secondary | ICD-10-CM | POA: Diagnosis not present

## 2017-02-13 DIAGNOSIS — F3289 Other specified depressive episodes: Secondary | ICD-10-CM | POA: Diagnosis not present

## 2017-02-13 MED ORDER — DULOXETINE HCL 30 MG PO CPEP: 30.0000 mg | ORAL_CAPSULE | Freq: Every day | ORAL | 3 refills | Status: DC

## 2017-02-13 NOTE — Progress Notes (Signed)
Subjective:    Patient ID: April Price, female    DOB: 05-20-1955, 61 y.o.   MRN: [id number]  April Price is a 61 y.o. female presenting on 02/13/2017 for Depression (High score PHQ9 at medicare wellness visit)   HPI Depression Pt has presented today for annual medicare wellness today and had elevated PHQ9 screening questionnaire. Pt agrees to visit w/ provider today to address depression.    Pt notes she has seasons when her depression is worse.  She has had SI and Suicide attempt in past, but states she is not currently suicidal and she does not want to be again.    Current treatment: Therapist w/ Dr. Bary Leriche and is taking buspirone 10 mg tid prn.  She further explains that dosing is seasonal more between October and March, but she still does not take the buspirone daily. - Pt also uses coping skills to keep moods higher during winter months.  She states she likes to stay busy, make sure she has interaction with other people and  "avoid isolation." - Pt currently notes pain causes depression to be worse and that her pain is sometimes worse w/ worsening depression.  She has tried celebrex w/o significant benefit for pain and has switched back to ibuprofen for pain.  Pt has not taken any long-acting antidepressants in past.   Depression screen [facility] 2/9 02/13/2017 06/15/2016 04/18/2016 02/10/2016  Decreased Interest 3 0 0 0  Down, Depressed, Hopeless 2 0 0 0  PHQ - 2 Score 5 0 0 0  Altered sleeping 3 - - -  Tired, decreased energy 3 - - -  Change in appetite 1 - - -  Feeling bad or failure about yourself  0 - - -  Trouble concentrating 3 - - -  Moving slowly or fidgety/restless 2 - - -  Suicidal thoughts 0 - - -  PHQ-9 Score 17 - - -  Difficult doing work/chores Extremely dIfficult - - -    Social History   Tobacco Use  . Smoking status: Current Every Day Smoker    Packs/day: 0.75    Years: 40.00    Pack years: 30.00    Types: Cigarettes  . Smokeless tobacco:  Never Used  Substance Use Topics  . Alcohol use: No  . Drug use: No    Review of Systems Per HPI unless specifically indicated above     Objective:    BP (!) 160/94   Pulse 86   Resp 16   Ht 5\' 4"  (1.626 m)   Wt 173 lb (78.5 kg)   BMI 29.70 kg/m   Wt Readings from Last 3 Encounters:  02/13/17 173 lb 12.8 oz (78.8 kg)  12/25/16 172 lb 12.8 oz (78.4 kg)  12/06/16 170 lb (77.1 kg)    Physical Exam  Constitutional: She is oriented to person, place, and time. She appears well-developed and well-nourished. No distress.  Pt smells of cigarette smoke.  HENT:  Head: Normocephalic and atraumatic.  Neurological: She is alert and oriented to person, place, and time.  Skin: Skin is warm and dry.  Psychiatric: Her speech is normal and behavior is normal. Judgment and thought content normal. Cognition and memory are normal. She exhibits a depressed mood.  Pt able to carry on appropriate conversation.     Results for orders placed or performed in visit on 06/15/16  ToxASSURE Select 9 (MW), Urine  Result Value Ref Range   ToxAssure Select 13 FINAL  Assessment & Plan:   Problem List Items Addressed This Visit      Other   Depression - Primary    Pt has depression w/ worsening symptoms seasonally, consistent w/ seasonal affective disorder.  Pt has significant change in PHQ9 since last assessment in March.  Pt attributes some of this change to her worsening pain, but also to change in weather and daylight.  Pt is not regularly dosing buspirone and is on no other antidepressants.  Plan: 1. START Cymbalta 30 mg once daily. Should receive benefit for pain control and depression. 2. Continue buspirone 10 mg tid prn.  Recommend taking at least one tablet once daily.  Discussed w/ pt need for consistent dosing. 3. Continue therapeutic counseling relationship and non-pharm coping skills. 4. Followup in December as scheduled or sooner if needed.      Relevant Medications    DULoxetine (CYMBALTA) 30 MG capsule      Meds ordered this encounter  Medications  . DULoxetine (CYMBALTA) 30 MG capsule    Sig: Take 1 capsule (30 mg total) daily by mouth.    Dispense:  30 capsule    Refill:  3    Order Specific Question:   Supervising Provider    Answer:   Olin Hauser [[id number]]    Follow up plan: No Follow-up on file.  Cassell Smiles, DNP, AGPCNP-BC Adult Gerontology Primary Care Nurse Practitioner [facility] 02/13/2017, 5:13 PM

## 2017-02-13 NOTE — Progress Notes (Signed)
Subjective:   April Price is a 61 y.o. female who presents for Medicare Annual (Subsequent) preventive examination.  Review of Systems:   Cardiac Risk Factors include: hypertension     Objective:     Vitals: BP (!) 170/98 (BP Location: Left Arm, Patient Position: Sitting)   Pulse 90   Temp 98.3 F (36.8 C) (Oral)   Resp 16   Ht 5\' 4"  (1.626 m)   Wt 173 lb 12.8 oz (78.8 kg)   BMI 29.83 kg/m   Body mass index is 29.83 kg/m.   Tobacco Social History   Tobacco Use  Smoking Status Current Every Day Smoker  . Packs/day: 0.75  . Years: 40.00  . Pack years: 30.00  . Types: Cigarettes  Smokeless Tobacco Never Used     Ready to quit: No Counseling given: Yes   Past Medical History:  Diagnosis Date  . Allergy   . Anxiety   . Arthritis   . Asthma   . Chronic fatigue   . Depression   . Depression screen   . Diffuse cystic mastopathy   . Fibromyalgia   . Fibromyalgia muscle pain   . GERD (gastroesophageal reflux disease)   . Heart murmur   . History of kidney stones   . Hypertension    driven by stress and pain   Past Surgical History:  Procedure Laterality Date  . ABDOMINAL HYSTERECTOMY    . BACK SURGERY     Spinal Fusion  . BREAST BIOPSY Right   . cataract    . CHOLECYSTECTOMY    . COLONOSCOPY  2006  . EYE SURGERY Bilateral 2011   cataract  . NASAL SINUS SURGERY    . NASAL SINUS SURGERY    . [place]  2003, 2012  . TYMPANOSTOMY TUBE PLACEMENT     Family History  Problem Relation Age of Onset  . Heart disease Mother   . Heart disease Father   . Stroke Father   . Vascular Disease Sister   . Heart disease Brother    Social History   Substance and Sexual Activity  Sexual Activity Not Currently    Outpatient Encounter Medications as of 02/13/2017  Medication Sig  . albuterol (PROVENTIL HFA;VENTOLIN HFA) 108 (90 Base) MCG/ACT inhaler Inhale 2 puffs into the lungs every 4 (four) hours as needed for wheezing or shortness of breath.   . busPIRone (BUSPAR) 10 MG tablet Take 10 mg 3 (three) times daily by mouth. Taking as needed  . cholecalciferol (VITAMIN D) 1000 units tablet Take 1,000 Units by mouth daily.  Marland Kitchen esomeprazole (NEXIUM) 40 MG capsule Take 1 capsule (40 mg total) by mouth daily.  Marland Kitchen guaiFENesin (MUCINEX) 600 MG 12 hr tablet Take 600 mg 2 (two) times daily by mouth.  Marland Kitchen ibuprofen (ADVIL,MOTRIN) 800 MG tablet Take 800 mg every 8 (eight) hours as needed by mouth.  . ranitidine (ZANTAC) 150 MG tablet Take 1 tablet (150 mg total) by mouth 2 (two) times daily.  Marland Kitchen tiZANidine (ZANAFLEX) 4 MG tablet 4 mg daily.  Marland Kitchen EPINEPHrine (EPIPEN 2-PAK) 0.3 mg/0.3 mL IJ SOAJ injection Inject 0.3 mLs (0.3 mg total) into the muscle once. (Patient not taking: Reported on 12/25/2016)  . gabapentin (NEURONTIN) 100 MG capsule Take 3 capsules (300 mg total) by mouth at bedtime. (Patient not taking: Reported on 02/13/2017)  . naloxone [facility]) 2 MG/2ML injection Inject content of syringe into thigh muscle. Call 911. (Patient not taking: Reported on 02/13/2017)  . [DISCONTINUED] celecoxib (CELEBREX) 400  MG capsule Take 1 capsule (400 mg total) by mouth daily after breakfast. .Take Motrin or Naprosyn90 (Patient not taking: Reported on 12/25/2016)  . [DISCONTINUED] celecoxib (CELEBREX) 400 MG capsule Take 1 capsule (400 mg total) by mouth daily after breakfast. Do not take with Motrin or with Naprosyn (Patient not taking: Reported on 02/13/2017)  . [DISCONTINUED] orphenadrine (NORFLEX) 100 MG tablet Take 1 tablet (100 mg total) by mouth 2 (two) times daily. Do not take with Zanaflex (Patient not taking: Reported on 12/25/2016)  . [DISCONTINUED] predniSONE (STERAPRED UNI-PAK 21 TAB) 10 MG (21) TBPK tablet 6 tabs day 1 and 2, 5 tabs day 3 and 4, 4 tabs day 5 and 6, 3 tabs day 7 and 8, 2 tabs day 9 and 10, 1 tab day 11 and 12. Take orally (Patient not taking: Reported on 12/25/2016)   No facility-administered encounter medications on file as of 02/13/2017.      Activities of Daily Living In your present state of health, do you have any difficulty performing the following activities: 02/13/2017 06/01/2016  Hearing? N N  Vision? N N  Difficulty concentrating or making decisions? Y N  Walking or climbing stairs? Y Y  Dressing or bathing? N N  Doing errands, shopping? N -  Preparing Food and eating ? N -  Using the Toilet? N -  In the past six months, have you accidently leaked urine? N -  Do you have problems with loss of bowel control? N -  Managing your Medications? N -  Managing your Finances? N -  Housekeeping or managing your Housekeeping? N -  Some recent data might be hidden    Patient Care Team: Mikey College, NP as PCP - General (Nurse Practitioner) Christene Lye, MD (General Surgery) Saffo, Delcie Roch, MD as Referring Physician (Psychology)    Assessment:     Exercise Activities and Dietary recommendations Current Exercise Habits: Home exercise routine, Type of exercise: yoga, Time (Minutes): 10, Frequency (Times/Week): 4, Weekly Exercise (Minutes/Week): 40, Intensity: Mild, Exercise limited by: None identified  Goals    None     Fall Risk Fall Risk  02/13/2017 06/15/2016 04/18/2016 02/10/2016  Falls in the past year? Yes Yes Yes No  Number falls in past yr: 2 or more 1 - -  Injury with Fall? Yes Yes No -  Comment - patient fell after losing her balance and fx'd left wrist which required ORIF by Dr Earnestine Leys approx 1 month ago.  - -  Risk Factor Category  High Fall Risk High Fall Risk - -  Risk for fall due to : History of fall(s) - - -  Follow up Falls prevention discussed - - -   Depression Screen PHQ 2/9 Scores 02/13/2017 06/15/2016 04/18/2016 02/10/2016  PHQ - 2 Score 5 0 0 0  PHQ- 9 Score 17 - - -     Cognitive Function     6CIT Screen 02/13/2017  What Year? 0 points  What month? 0 points  What time? 0 points  Count back from 20 0 points  Months in reverse 0 points  Repeat phrase 0 points   Total Score 0    Immunization History  Administered Date(s) Administered  . Influenza Split 02/16/2011, 02/06/2012  . Influenza Whole 04/10/2009  . Pneumococcal Polysaccharide-23 04/10/2008   Screening Tests Health Maintenance  Topic Date Due  . COLONOSCOPY  11/29/2015  . MAMMOGRAM  01/09/2016  . INFLUENZA VACCINE  11/08/2016  . PAP SMEAR  02/06/2019 (Originally  09/09/2007)  . TETANUS/TDAP  08/28/2020 (Originally 01/22/1975)  . Hepatitis C Screening  Completed  . HIV Screening  Completed      Plan:     I have personally reviewed and addressed the Medicare Annual Wellness questionnaire and have noted the following in the patient's chart:  A. Medical and social history B. Use of alcohol, tobacco or illicit drugs  C. Current medications and supplements D. Functional ability and status E.  Nutritional status F.  Physical activity G. Advance directives H. List of other physicians I.  Hospitalizations, surgeries, and ER visits in previous 12 months J.  [place] such as hearing and vision if needed, cognitive and depression L. Referrals and appointments   In addition, I have reviewed and discussed with patient certain preventive protocols, quality metrics, and best practice recommendations. A written personalized care plan for preventive services as well as general preventive health recommendations were provided to patient.   Signed,  Tyler Aas, LPN Nurse Health Advisor   Nurse Notes: Patients depression screening score 17, no HI/SI at this time. Discussed with Lissa Merlin.Patient states she takes Buspar as needed and sees Dr.Saffo for therapy. Lauren to see patient today to discuss this.  Patient stats she mailed back cologuard 2 weeks ago. Has Mammogram scheduled for 03/06/2017. Will get influenza vaccine at her pharmacy and bring records of immunizations.

## 2017-02-13 NOTE — Patient Instructions (Addendum)
April Price , Thank you for taking time to come for your Medicare Wellness Visit. I appreciate your ongoing commitment to your health goals. Please review the following plan we discussed and let me know if I can assist you in the future.   Screening recommendations/referrals: Colonoscopy: last completed 11/28/2005, cologuard mailed in recently  Mammogram: last completed 01/08/2014, due now Bone Density: completed 03/12/2012 Recommended yearly ophthalmology/optometry visit for glaucoma screening and checkup Recommended yearly dental visit for hygiene and checkup  Vaccinations: Influenza vaccine: due now Pneumococcal vaccine: due at age 61 Tdap vaccine: due, check with your insurance company for coverage  Shingles vaccine: up to date, please bring a record.   Advanced directives: Please bring a copy of your health care power of attorney and living will to the office at your convenience.  Conditions/risks identified: Recommend drinking at least 6-7 glasses of water a day  Smoking cessation discussed   Next appointment: Follow up on 03/26/2017 at 8:00am with Lauren kennedy,NP. Follow up in one year for your annual wellness exam.   Preventive Care 40-64 Years, Female Preventive care refers to lifestyle choices and visits with your health care provider that can promote health and wellness. What does preventive care include?  A yearly physical exam. This is also called an annual well check.  Dental exams once or twice a year.  Routine eye exams. Ask your health care provider how often you should have your eyes checked.  Personal lifestyle choices, including:  Daily care of your teeth and gums.  Regular physical activity.  Eating a healthy diet.  Avoiding tobacco and drug use.  Limiting alcohol use.  Practicing safe sex.  Taking low-dose aspirin daily starting at age 80.  Taking vitamin and mineral supplements as recommended by your health care provider. What happens during an  annual well check? The services and screenings done by your health care provider during your annual well check will depend on your age, overall health, lifestyle risk factors, and family history of disease. Counseling  Your health care provider may ask you questions about your:  Alcohol use.  Tobacco use.  Drug use.  Emotional well-being.  Home and relationship well-being.  Sexual activity.  Eating habits.  Work and work Statistician.  Method of birth control.  Menstrual cycle.  Pregnancy history. Screening  You may have the following tests or measurements:  Height, weight, and BMI.  Blood pressure.  Lipid and cholesterol levels. These may be checked every 5 years, or more frequently if you are over 89 years old.  Skin check.  Lung cancer screening. You may have this screening every year starting at age 45 if you have a 30-pack-year history of smoking and currently smoke or have quit within the past 15 years.  Fecal occult blood test (FOBT) of the stool. You may have this test every year starting at age 73.  Flexible sigmoidoscopy or colonoscopy. You may have a sigmoidoscopy every 5 years or a colonoscopy every 10 years starting at age 42.  Hepatitis C blood test.  Hepatitis B blood test.  Sexually transmitted disease (STD) testing.  Diabetes screening. This is done by checking your blood sugar (glucose) after you have not eaten for a while (fasting). You may have this done every 1-3 years.  Mammogram. This may be done every 1-2 years. Talk to your health care provider about when you should start having regular mammograms. This may depend on whether you have a family history of breast cancer.  BRCA-related cancer screening.  This may be done if you have a family history of breast, ovarian, tubal, or peritoneal cancers.  Pelvic exam and Pap test. This may be done every 3 years starting at age 56. Starting at age 67, this may be done every 5 years if you have a Pap  test in combination with an HPV test.  Bone density scan. This is done to screen for osteoporosis. You may have this scan if you are at high risk for osteoporosis. Discuss your test results, treatment options, and if necessary, the need for more tests with your health care provider. Vaccines  Your health care provider may recommend certain vaccines, such as:  Influenza vaccine. This is recommended every year.  Tetanus, diphtheria, and acellular pertussis (Tdap, Td) vaccine. You may need a Td booster every 10 years.  Zoster vaccine. You may need this after age 70.  Pneumococcal 13-valent conjugate (PCV13) vaccine. You may need this if you have certain conditions and were not previously vaccinated.  Pneumococcal polysaccharide (PPSV23) vaccine. You may need one or two doses if you smoke cigarettes or if you have certain conditions. Talk to your health care provider about which screenings and vaccines you need and how often you need them. This information is not intended to replace advice given to you by your health care provider. Make sure you discuss any questions you have with your health care provider. Document Released: 04/23/2015 Document Revised: 12/15/2015 Document Reviewed: 01/26/2015 Elsevier Interactive Patient Education  2017 [employer] Prevention in the Home Falls can cause injuries. They can happen to people of all ages. There are many things you can do to make your home safe and to help prevent falls. What can I do on the outside of my home?  Regularly fix the edges of walkways and driveways and fix any cracks.  Remove anything that might make you trip as you walk through a door, such as a raised step or threshold.  Trim any bushes or trees on the path to your home.  Use bright outdoor lighting.  Clear any walking paths of anything that might make someone trip, such as rocks or tools.  Regularly check to see if handrails are loose or broken. Make sure that  both sides of any steps have handrails.  Any raised decks and porches should have guardrails on the edges.  Have any leaves, snow, or ice cleared regularly.  Use sand or salt on walking paths during winter.  Clean up any spills in your garage right away. This includes oil or grease spills. What can I do in the bathroom?  Use night lights.  Install grab bars by the toilet and in the tub and shower. Do not use towel bars as grab bars.  Use non-skid mats or decals in the tub or shower.  If you need to sit down in the shower, use a plastic, non-slip stool.  Keep the floor dry. Clean up any water that spills on the floor as soon as it happens.  Remove soap buildup in the tub or shower regularly.  Attach bath mats securely with double-sided non-slip rug tape.  Do not have throw rugs and other things on the floor that can make you trip. What can I do in the bedroom?  Use night lights.  Make sure that you have a light by your bed that is easy to reach.  Do not use any sheets or blankets that are too big for your bed. They should not hang  down onto the floor.  Have a firm chair that has side arms. You can use this for support while you get dressed.  Do not have throw rugs and other things on the floor that can make you trip. What can I do in the kitchen?  Clean up any spills right away.  Avoid walking on wet floors.  Keep items that you use a lot in easy-to-reach places.  If you need to reach something above you, use a strong step stool that has a grab bar.  Keep electrical cords out of the way.  Do not use floor polish or wax that makes floors slippery. If you must use wax, use non-skid floor wax.  Do not have throw rugs and other things on the floor that can make you trip. What can I do with my stairs?  Do not leave any items on the stairs.  Make sure that there are handrails on both sides of the stairs and use them. Fix handrails that are broken or loose. Make sure  that handrails are as long as the stairways.  Check any carpeting to make sure that it is firmly attached to the stairs. Fix any carpet that is loose or worn.  Avoid having throw rugs at the top or bottom of the stairs. If you do have throw rugs, attach them to the floor with carpet tape.  Make sure that you have a light switch at the top of the stairs and the bottom of the stairs. If you do not have them, ask someone to add them for you. What else can I do to help prevent falls?  Wear shoes that:  Do not have high heels.  Have rubber bottoms.  Are comfortable and fit you well.  Are closed at the toe. Do not wear sandals.  If you use a stepladder:  Make sure that it is fully opened. Do not climb a closed stepladder.  Make sure that both sides of the stepladder are locked into place.  Ask someone to hold it for you, if possible.  Clearly mark and make sure that you can see:  Any grab bars or handrails.  First and last steps.  Where the edge of each step is.  Use tools that help you move around (mobility aids) if they are needed. These include:  Canes.  Walkers.  Scooters.  Crutches.  Turn on the lights when you go into a dark area. Replace any light bulbs as soon as they burn out.  Set up your furniture so you have a clear path. Avoid moving your furniture around.  If any of your floors are uneven, fix them.  If there are any pets around you, be aware of where they are.  Review your medicines with your doctor. Some medicines can make you feel dizzy. This can increase your chance of falling. Ask your doctor what other things that you can do to help prevent falls. This information is not intended to replace advice given to you by your health care provider. Make sure you discuss any questions you have with your health care provider. Document Released: 01/21/2009 Document Revised: 09/02/2015 Document Reviewed: 05/01/2014 Elsevier Interactive Patient Education   2017 [employer].

## 2017-02-13 NOTE — Assessment & Plan Note (Addendum)
Pt has depression w/ worsening symptoms seasonally, consistent w/ seasonal affective disorder.  Pt has significant change in PHQ9 since last assessment in March.  Pt attributes some of this change to her worsening pain, but also to change in weather and daylight.  Pt is not regularly dosing buspirone and is on no other antidepressants.  Plan: 1. START Cymbalta 30 mg once daily. Should receive benefit for pain control and depression. 2. Continue buspirone 10 mg tid prn.  Recommend taking at least one tablet once daily.  Discussed w/ pt need for consistent dosing. 3. Continue therapeutic counseling relationship and non-pharm coping skills. 4. Followup in December as scheduled or sooner if needed.

## 2017-03-06 ENCOUNTER — Other Ambulatory Visit: Payer: Medicare Other

## 2017-03-07 DIAGNOSIS — F4320 Adjustment disorder, unspecified: Secondary | ICD-10-CM | POA: Diagnosis not present

## 2017-03-26 ENCOUNTER — Other Ambulatory Visit: Payer: Self-pay

## 2017-03-26 ENCOUNTER — Ambulatory Visit (INDEPENDENT_AMBULATORY_CARE_PROVIDER_SITE_OTHER): Payer: Medicare Other | Admitting: Nurse Practitioner

## 2017-03-26 VITALS — BP 153/78 | HR 81 | Temp 98.6°F | Ht 64.0 in | Wt 178.2 lb

## 2017-03-26 DIAGNOSIS — M545 Low back pain, unspecified: Secondary | ICD-10-CM

## 2017-03-26 DIAGNOSIS — Z23 Encounter for immunization: Secondary | ICD-10-CM | POA: Diagnosis not present

## 2017-03-26 DIAGNOSIS — F3289 Other specified depressive episodes: Secondary | ICD-10-CM | POA: Diagnosis not present

## 2017-03-26 DIAGNOSIS — I10 Essential (primary) hypertension: Secondary | ICD-10-CM | POA: Diagnosis not present

## 2017-03-26 DIAGNOSIS — G8929 Other chronic pain: Secondary | ICD-10-CM

## 2017-03-26 DIAGNOSIS — M797 Fibromyalgia: Secondary | ICD-10-CM | POA: Diagnosis not present

## 2017-03-26 MED ORDER — LISINOPRIL 5 MG PO TABS: 5.0000 mg | ORAL_TABLET | Freq: Every day | ORAL | 3 refills | Status: DC

## 2017-03-26 MED ORDER — DULOXETINE HCL 60 MG PO CPEP: 60.0000 mg | ORAL_CAPSULE | Freq: Every day | ORAL | 5 refills | Status: DC

## 2017-03-26 MED ORDER — TIZANIDINE HCL 4 MG PO TABS: 8.0000 mg | ORAL_TABLET | Freq: Four times a day (QID) | ORAL | 2 refills | Status: DC | PRN

## 2017-03-26 MED ORDER — BUSPIRONE HCL 10 MG PO TABS: 10.0000 mg | ORAL_TABLET | Freq: Three times a day (TID) | ORAL | 5 refills | Status: DC

## 2017-03-26 NOTE — Progress Notes (Signed)
Subjective:    Patient ID: April Price, female    DOB: 1955-06-17, 61 y.o.   MRN: [id number]  April Price is a 61 y.o. female presenting on 03/26/2017 for Depression   HPI Depression States her duloxetine is not improving her depression, but buspar stopped when she started her duloxetine.    - Pt reports persistent sadness, crying. States she has frequent "pity parties." - Uses distractors to get out of the sadness.  Admits symptoms have not gotten worse and continues to deny SI/HI and plans to carry out if they arise.  Fibromyalgia Would like to change her muscle relaxer as she states tizanidine is no longer working very well.  Last night was a horrible night for left lateral hip pain despite taking tizanidine 8 mg tid.  Pt does not necessarily desire different medication, but requests dose increase.  Pt has regular pain from trigger points and has previously been on opioid therapy.  Is still waiting on call-back from [facility] pain management. - notes no decrease in fibromyalgia pain on duloxetine 30 mg once daily. - Admits she has to budget her energy level.  If overexerts herself, is significantly tired and in more pain the following day.  Hypertension No current medications.  Has had several elevated BP readings on exam.  Has tried one hypertension medication in past, but states it made her BP too low.  Pt is willing to resume medication and start a low dose today.  She does not remember which med she has previously tried. - She is checking BP at home or outside of clinic.  Readings 120-130/70-80 - She is not currently symptomatic. - Pt denies headache, lightheadedness, dizziness, changes in vision, chest tightness/pressure, palpitations, leg swelling, sudden loss of speech or loss of consciousness. - She  reports no regular exercise routine. - Her diet is moderate in salt, moderate in fat, and moderate in carbohydrates.  Social History   Tobacco Use  . Smoking  status: Current Every Day Smoker    Packs/day: 0.75    Years: 40.00    Pack years: 30.00    Types: Cigarettes  . Smokeless tobacco: Never Used  Substance Use Topics  . Alcohol use: No  . Drug use: No    Review of Systems Per HPI unless specifically indicated above     Objective:    BP (!) 153/78 (BP Location: Right Arm, Patient Position: Sitting, Cuff Size: Normal)   Pulse 81   Temp 98.6 F (37 C) (Oral)   Ht 5\' 4"  (1.626 m)   Wt 178 lb 3.2 oz (80.8 kg)   BMI 30.59 kg/m   Wt Readings from Last 3 Encounters:  03/26/17 178 lb 3.2 oz (80.8 kg)  02/13/17 173 lb (78.5 kg)  02/13/17 173 lb 12.8 oz (78.8 kg)    Physical Exam  General - obese, well-appearing, NAD HEENT - Normocephalic, atraumatic Neck - supple, non-tender, no LAD, no thyromegaly, no carotid bruit Heart - RRR, no murmurs heard Lungs - Clear throughout all lobes, no wheezing, crackles, or rhonchi. Normal work of breathing. Musculoskeletal - Pt has multiple trigger points lateral hips, lower back, left shoulder at medial edge of shoulder blade, trapezius muscles.  Pt also has hypertonic paraspinal muscles from cervical to lumbar spine. Extremeties - non-tender, no edema, cap refill < 2 seconds, peripheral pulses intact +2 bilaterally Skin - warm, dry Neuro - awake, alert, oriented x3, normal gait Psych - Normal mood and affect, normal behavior  Assessment & Plan:   Problem List Items Addressed This Visit      Cardiovascular and Mediastinum   Hypertension    Uncontrolled hypertension not currently on meds.  Pt has previously refused, stating her pain needs to be controlled for better BP.  Pt is still waiting on pain management and is not working on lifestyle modifications.  No apparent complications.  Plan: 1. START taking lisinopril 5 mg once daily.  Low dose because pt reports hypotension on BP med in past. 2. Obtain labs - CMP today and BMP in 2 weeks.  3. Encouraged heart healthy diet and increasing  exercise to 30 minutes most days of the week. 4. Check BP 1-2 x per week at home, keep log, and bring to clinic at next appointment. 5. Follow up 4 weeks.      Relevant Medications   lisinopril (PRINIVIL,ZESTRIL) 5 MG tablet   Other Relevant Orders   Comprehensive metabolic panel (Completed)   Basic Metabolic Panel (BMET)     Other   Fibromyalgia - Primary (Chronic)    Pain not currently well controlled, but patient is tolerating for now.  Is on waiting list for new pain management provider to accept new patients. Has stopped Celebrex in past and is currently getting mild relief from pain with new duloxetine.  Plan: 1. Increase duloxetine to 60 mg once daily. 2. STOP gabapentin per pt preference. 3. Continue your exercises learned at PT.  Recommend repeat visit to PT, but patient declines. 4. Reviewed no chronic opioids from this clinic. 5. Follow up as needed and in 3 months.      Relevant Medications   tiZANidine (ZANAFLEX) 4 MG tablet   Chronic low back pain (Bilateral) (L>R) (Chronic)    See AP above for fibromyalgia. Pt encouraged to continue plans for pain management clinic.      Relevant Medications   tiZANidine (ZANAFLEX) 4 MG tablet   Depression    Pt has depression w/ worsening symptoms seasonally, consistent w/ seasonal affective disorder.  With start of duloxetine 30 mg once daily, pt stopped buspirone.  Has noted some improvement in symptoms, but not much and is regularly tearful.  PHQ9 remains elevated.  Plan: 1. INCREASE to duloxetine 60mg  once daily. Should receive benefit for pain control and depression. 2. RESUME buspirone 10 mg tid prn.  Should try taking two tablets daily.  Discussed w/ pt need for consistent dosing. 3. Continue therapeutic counseling relationship and non-pharm coping skills. 4. Followup 4 weeks      Relevant Medications   DULoxetine (CYMBALTA) 60 MG capsule   busPIRone (BUSPAR) 10 MG tablet    Other Visit Diagnoses    Need for  immunization against influenza       Pt age< 65.  Administer quadrivalent flu.   Relevant Orders   Flu Vaccine QUAD 6+ mos PF IM (Fluarix Quad PF) (Completed)      Meds ordered this encounter  Medications  . DULoxetine (CYMBALTA) 60 MG capsule    Sig: Take 1 capsule (60 mg total) by mouth daily.    Dispense:  30 capsule    Refill:  5    Order Specific Question:   Supervising Provider    Answer:   Olin Hauser [[id number]]  . tiZANidine (ZANAFLEX) 4 MG tablet    Sig: Take 2 tablets (8 mg total) by mouth 4 (four) times daily as needed for muscle spasms.    Dispense:  120 tablet    Refill:  2  Order Specific Question:   Supervising Provider    Answer:   Olin Hauser [[id number]]  . lisinopril (PRINIVIL,ZESTRIL) 5 MG tablet    Sig: Take 1 tablet (5 mg total) by mouth daily.    Dispense:  90 tablet    Refill:  3    Order Specific Question:   Supervising Provider    Answer:   Olin Hauser [[id number]]  . busPIRone (BUSPAR) 10 MG tablet    Sig: Take 1 tablet (10 mg total) by mouth 3 (three) times daily. Taking as needed    Dispense:  60 tablet    Refill:  5    Order Specific Question:   Supervising Provider    Answer:   Olin Hauser [[id number]]    Follow up plan: Return in about 4 weeks (around 04/23/2017) for Blood pressure and depression.  Cassell Smiles, DNP, AGPCNP-BC Adult Gerontology Primary Care Nurse Practitioner [facility] 04/06/2017, 1:33 PM

## 2017-03-26 NOTE — Patient Instructions (Addendum)
[place], Thank you for coming in to clinic today.  1. For your depression: - RESUME your buspar 10 mg twice daily.  2. For your fibromyalgia: - INCREASE cymbalta to 60 mg once daily.   You can take two 30 mg tablets at once until you get your new fill.  3. For your hypertension: - START lisinopril 5 mg once daily. - Return to clinic in 2 weeks for blood pressure check and labs for kidney function.  Please schedule a follow-up appointment with Cassell Smiles, AGNP. Return in about 4 weeks (around 04/23/2017) for Blood pressure and depression.  If you have any other questions or concerns, please feel free to call the clinic or send a message through [place]. You may also schedule an earlier appointment if necessary.  You will receive a survey after today's visit either digitally by e-mail or paper by [employer]. Your experiences and feedback matter to [place].  Please respond so we know how we are doing as we provide care for you.   Cassell Smiles, DNP, AGNP-BC Adult Gerontology Nurse Practitioner [place]

## 2017-03-27 LAB — COMPREHENSIVE METABOLIC PANEL
AG Ratio: 1.3 (calc) (ref 1.0–2.5)
ALT: 9 U/L (ref 6–29)
AST: 14 U/L (ref 10–35)
Albumin: 3.9 g/dL (ref 3.6–5.1)
Alkaline phosphatase (APISO): 85 U/L (ref 33–130)
BUN/Creatinine Ratio: 16 (calc) (ref 6–22)
BUN: 20 mg/dL (ref 7–25)
CO2: 23 mmol/L (ref 20–32)
Calcium: 9.5 mg/dL (ref 8.6–10.4)
Chloride: 104 mmol/L (ref 98–110)
Creat: 1.25 mg/dL — ABNORMAL HIGH (ref 0.50–0.99)
Globulin: 2.9 g/dL (calc) (ref 1.9–3.7)
Glucose, Bld: 82 mg/dL (ref 65–139)
Potassium: 4.2 mmol/L (ref 3.5–5.3)
Sodium: 138 mmol/L (ref 135–146)
Total Bilirubin: 0.4 mg/dL (ref 0.2–1.2)
Total Protein: 6.8 g/dL (ref 6.1–8.1)

## 2017-04-06 ENCOUNTER — Encounter: Payer: Self-pay | Admitting: Nurse Practitioner

## 2017-04-06 NOTE — Assessment & Plan Note (Signed)
See AP above for fibromyalgia. Pt encouraged to continue plans for pain management clinic.

## 2017-04-06 NOTE — Assessment & Plan Note (Signed)
Pain not currently well controlled, but patient is tolerating for now.  Is on waiting list for new pain management provider to accept new patients. Has stopped Celebrex in past and is currently getting mild relief from pain with new duloxetine.  Plan: 1. Increase duloxetine to 60 mg once daily. 2. STOP gabapentin per pt preference. 3. Continue your exercises learned at PT.  Recommend repeat visit to PT, but patient declines. 4. Reviewed no chronic opioids from this clinic. 5. Follow up as needed and in 3 months.

## 2017-04-06 NOTE — Assessment & Plan Note (Signed)
Uncontrolled hypertension not currently on meds.  Pt has previously refused, stating her pain needs to be controlled for better BP.  Pt is still waiting on pain management and is not working on lifestyle modifications.  No apparent complications.  Plan: 1. START taking lisinopril 5 mg once daily.  Low dose because pt reports hypotension on BP med in past. 2. Obtain labs - CMP today and BMP in 2 weeks.  3. Encouraged heart healthy diet and increasing exercise to 30 minutes most days of the week. 4. Check BP 1-2 x per week at home, keep log, and bring to clinic at next appointment. 5. Follow up 4 weeks.

## 2017-04-06 NOTE — Assessment & Plan Note (Signed)
Pt has depression w/ worsening symptoms seasonally, consistent w/ seasonal affective disorder.  With start of duloxetine 30 mg once daily, pt stopped buspirone.  Has noted some improvement in symptoms, but not much and is regularly tearful.  PHQ9 remains elevated.  Plan: 1. INCREASE to duloxetine 60mg  once daily. Should receive benefit for pain control and depression. 2. RESUME buspirone 10 mg tid prn.  Should try taking two tablets daily.  Discussed w/ pt need for consistent dosing. 3. Continue therapeutic counseling relationship and non-pharm coping skills. 4. Followup 4 weeks

## 2017-04-09 ENCOUNTER — Telehealth: Payer: Self-pay | Admitting: Nurse Practitioner

## 2017-04-09 MED ORDER — BENZONATATE 100 MG PO CAPS: 100.0000 mg | ORAL_CAPSULE | Freq: Two times a day (BID) | ORAL | 0 refills | Status: DC | PRN

## 2017-04-09 NOTE — Telephone Encounter (Signed)
Pt said she has had bronchitis and is still coughing, no fever with clear mucus. She has been taking 1200 mg of generic mucinex twice daily.  She asked if she could get some cough pills called into [facility].  Her call back number is [phone]

## 2017-04-12 ENCOUNTER — Ambulatory Visit
Admission: RE | Admit: 2017-04-12 | Discharge: 2017-04-12 | Disposition: A | Discharge status: Home / Self Care | Payer: Medicare Other | Source: Ambulatory Visit | Attending: Nurse Practitioner | Admitting: Nurse Practitioner

## 2017-04-12 ENCOUNTER — Other Ambulatory Visit: Payer: Self-pay | Admitting: Nurse Practitioner

## 2017-04-12 DIAGNOSIS — Z1231 Encounter for screening mammogram for malignant neoplasm of breast: Secondary | ICD-10-CM | POA: Insufficient documentation

## 2017-04-12 DIAGNOSIS — Z13820 Encounter for screening for osteoporosis: Secondary | ICD-10-CM | POA: Insufficient documentation

## 2017-04-12 DIAGNOSIS — M810 Age-related osteoporosis without current pathological fracture: Secondary | ICD-10-CM | POA: Insufficient documentation

## 2017-04-12 DIAGNOSIS — Z780 Asymptomatic menopausal state: Secondary | ICD-10-CM

## 2017-04-12 DIAGNOSIS — I10 Essential (primary) hypertension: Secondary | ICD-10-CM | POA: Diagnosis not present

## 2017-04-12 DIAGNOSIS — Z1239 Encounter for other screening for malignant neoplasm of breast: Secondary | ICD-10-CM

## 2017-04-13 ENCOUNTER — Encounter: Payer: Self-pay | Admitting: Nurse Practitioner

## 2017-04-13 LAB — BASIC METABOLIC PANEL
BUN/Creatinine Ratio: 15 (calc) (ref 6–22)
BUN: 16 mg/dL (ref 7–25)
CO2: 21 mmol/L (ref 20–32)
Calcium: 9.5 mg/dL (ref 8.6–10.4)
Chloride: 108 mmol/L (ref 98–110)
Creat: 1.1 mg/dL — ABNORMAL HIGH (ref 0.50–0.99)
Glucose, Bld: 79 mg/dL (ref 65–99)
Potassium: 4.2 mmol/L (ref 3.5–5.3)
Sodium: 137 mmol/L (ref 135–146)

## 2017-04-19 ENCOUNTER — Encounter: Payer: Self-pay | Admitting: Nurse Practitioner

## 2017-04-19 ENCOUNTER — Ambulatory Visit (INDEPENDENT_AMBULATORY_CARE_PROVIDER_SITE_OTHER): Payer: Medicare Other | Admitting: Nurse Practitioner

## 2017-04-19 VITALS — BP 148/83 | HR 81 | Temp 98.5°F | Ht 64.0 in | Wt 176.0 lb

## 2017-04-19 DIAGNOSIS — F3289 Other specified depressive episodes: Secondary | ICD-10-CM | POA: Diagnosis not present

## 2017-04-19 DIAGNOSIS — I10 Essential (primary) hypertension: Secondary | ICD-10-CM | POA: Diagnosis not present

## 2017-04-19 NOTE — Progress Notes (Signed)
Subjective:    Patient ID: April Price, female    DOB: September 17, 1955, 62 y.o.   MRN: [id number]  April Price is a 62 y.o. female presenting on 04/19/2017 for Hypertension and Depression   HPI Hypertension - She is checking BP at home or outside of clinic.  Readings 126-134/70-82 - Current medications: lisinopril 5 mg once daily, tolerating well without side effects - She is not currently symptomatic. - Pt denies headache, lightheadedness, dizziness, changes in vision, chest tightness/pressure, palpitations, leg swelling, sudden loss of speech or loss of consciousness. - She  reports no regular exercise routine. - Her diet is moderate in salt, moderate in fat, and moderate in carbohydrates.  Depression Feels she is doing much better.  Rest is somewhat improved, but has been sick so still isn't getting out to do much.  (URI symptoms x last 2 weeks, but is beginning to improve) - Reports some improved pain level since increasing duloxetine.   Depression screen [facility] 2/9 04/19/2017 03/26/2017 02/13/2017 06/15/2016 04/18/2016  Decreased Interest 0 1 3 0 0  Down, Depressed, Hopeless 1 2 2  0 0  PHQ - 2 Score 1 3 5  0 0  Altered sleeping 2 3 3  - -  Tired, decreased energy 2 2 3  - -  Change in appetite 1 1 1  - -  Feeling bad or failure about yourself  0 0 0 - -  Trouble concentrating 0 2 3 - -  Moving slowly or fidgety/restless 0 0 2 - -  Suicidal thoughts 0 0 0 - -  PHQ-9 Score 6 11 17  - -  Difficult doing work/chores Somewhat difficult Somewhat difficult Extremely dIfficult - -    Social History   Tobacco Use  . Smoking status: Current Every Day Smoker    Packs/day: 0.75    Years: 40.00    Pack years: 30.00    Types: Cigarettes  . Smokeless tobacco: Never Used  Substance Use Topics  . Alcohol use: No  . Drug use: No    Review of Systems Per HPI unless specifically indicated above     Objective:    BP (!) 148/83 (BP Location: Right Arm, Patient Position:  Sitting, Cuff Size: Normal)   Pulse 81   Temp 98.5 F (36.9 C) (Oral)   Ht 5\' 4"  (1.626 m)   Wt 176 lb (79.8 kg)   BMI 30.21 kg/m   Wt Readings from Last 3 Encounters:  04/19/17 176 lb (79.8 kg)  03/26/17 178 lb 3.2 oz (80.8 kg)  02/13/17 173 lb (78.5 kg)    Physical Exam  General - overweight, well-appearing, NAD HEENT - Normocephalic, atraumatic Neck - supple, non-tender, no LAD, no thyromegaly, no carotid bruit Heart - RRR, no murmurs heard Lungs - Clear throughout all lobes, no wheezing, crackles, or rhonchi. Normal work of breathing. Extremeties - non-tender, no edema, cap refill < 2 seconds, peripheral pulses intact +2 bilaterally Skin - warm, dry Neuro - awake, alert, oriented x3, normal gait Psych - Normal mood and affect, normal behavior    Results for orders placed or performed in visit on 47/09/62  Basic Metabolic Panel (BMET)  Result Value Ref Range   Glucose, Bld 79 65 - 99 mg/dL   BUN 16 7 - 25 mg/dL   Creat 1.10 (H) 0.50 - 0.99 mg/dL   BUN/Creatinine Ratio 15 6 - 22 (calc)   Sodium 137 135 - 146 mmol/L   Potassium 4.2 3.5 - 5.3 mmol/L   Chloride 108 98 -  110 mmol/L   CO2 21 20 - 32 mmol/L   Calcium 9.5 8.6 - 10.4 mg/dL      Assessment & Plan:   Problem List Items Addressed This Visit      Cardiovascular and Mediastinum   Hypertension - Primary    Improved, but persistently uncontrolled hypertension on lisinopril.  Pt has previously refused, stating her pain needs to be controlled for better BP.  Pt is still waiting on pain management and is not working on lifestyle modifications.  No apparent complications.  Plan: 1. Continue taking lisinopril 5 mg once daily.  Consider increase at next appointment if persistently elevated and after acute cold/illness with taking decongestants. 2. Kidney function remained stable on med. 3. Encouraged heart healthy diet and increasing exercise to 30 minutes most days of the week. 4. Check BP 1-2 x per week at home,  keep log, and bring to clinic at next appointment. 5. Follow up 3 months.        Other   Depression    Pt has depression w/ slight improvement of symptoms over last 4 weeks on higher dose duloxetine and buspirone. Pt continues to note is worse seasonally, consistent w/ seasonal affective disorder.  Is still having some tearfulness, but is improved and improving anhedonia.  PHQ9 remains higher than goal, but no changes are required today.  Plan: 1. CONTINUE duloxetine 60mg  once daily. Should receive benefit for pain control and depression. 2. CONTINUE buspirone 10 mg BID. 3. Continue therapeutic counseling relationship and non-pharm coping skills. 4. Followup 3 months.           Follow up plan: Return in about 3 months (around 07/18/2017) for hypertension and depression.   Cassell Smiles, DNP, AGPCNP-BC Adult Gerontology Primary Care Nurse Practitioner [facility] 05/08/2017, 12:42 PM

## 2017-04-19 NOTE — Patient Instructions (Addendum)
April Price, Thank you for coming in to clinic today.  1.  It sounds like you have a Upper Respiratory Virus and some additional mucous production related to allergies. - Start anti-histamine cetirizine 10mg  daily, also can use Flonase 2 sprays each nostril daily for up to 4-6 weeks - If congestion is worse, start OTC Mucinex (or may try Mucinex-DM for cough) up to 7-10 days then stop - Drink plenty of fluids to improve congestion - You may try over the counter Nasal Saline spray (Simply Saline, Ocean Spray) as needed to reduce congestion. - Drink warm herbal tea with honey for sore throat. - Start taking Tylenol extra strength 1 to 2 tablets every 6-8 hours for aches or fever/chills for next few days as needed.  Do not take more than 3,000 mg in 24 hours from all medicines.  May take Ibuprofen as well if tolerated 200-400mg  every 8 hours as needed.  If symptoms significantly worsening with persistent fevers/chills despite tylenol/ibpurofen, nausea, vomiting unable to tolerate food/fluids or medicine, body aches, or shortness of breath, sinus pain pressure or worsening productive cough, then follow-up for re-evaluation, may seek more immediate care at Urgent Care or ED if more concerned for emergency.  2. Continue lisinopril 5 mg once daily.  It is not causing low blood pressures.  3. For you moods, continue buspar 10 mg three times daily and duloxetine 60 mg once daily.  Please schedule a follow-up appointment with Cassell Smiles, AGNP. Return in about 3 months (around 07/18/2017) for hypertension and depression.  If you have any other questions or concerns, please feel free to call the clinic or send a message through [place]. You may also schedule an earlier appointment if necessary.  You will receive a survey after today's visit either digitally by e-mail or paper by [employer]. Your experiences and feedback matter to [place].  Please respond so we know how we are doing as we provide care for  you.   Cassell Smiles, DNP, AGNP-BC Adult Gerontology Nurse Practitioner [place]

## 2017-04-23 ENCOUNTER — Other Ambulatory Visit: Payer: Self-pay | Admitting: Nurse Practitioner

## 2017-04-23 ENCOUNTER — Ambulatory Visit: Payer: Medicare Other | Admitting: Nurse Practitioner

## 2017-04-23 DIAGNOSIS — J069 Acute upper respiratory infection, unspecified: Secondary | ICD-10-CM

## 2017-04-23 MED ORDER — DOXYCYCLINE HYCLATE 100 MG PO TABS: 100.0000 mg | ORAL_TABLET | Freq: Two times a day (BID) | ORAL | 0 refills | Status: DC

## 2017-04-23 NOTE — Telephone Encounter (Signed)
Pt. Called states that she was a little better but wanted you to call in an antibiotic to  [facility]. Pt call back  # is   [phone]

## 2017-04-23 NOTE — Telephone Encounter (Signed)
Provided doxycycline 100 mg tablet.  Take 1 tablet twice daily for 10 days.

## 2017-04-23 NOTE — Telephone Encounter (Signed)
The pt was notified. No questions or concerns. 

## 2017-05-08 ENCOUNTER — Encounter: Payer: Self-pay | Admitting: Nurse Practitioner

## 2017-05-08 NOTE — Assessment & Plan Note (Signed)
Improved, but persistently uncontrolled hypertension on lisinopril.  Pt has previously refused, stating her pain needs to be controlled for better BP.  Pt is still waiting on pain management and is not working on lifestyle modifications.  No apparent complications.  Plan: 1. Continue taking lisinopril 5 mg once daily.  Consider increase at next appointment if persistently elevated and after acute cold/illness with taking decongestants. 2. Kidney function remained stable on med. 3. Encouraged heart healthy diet and increasing exercise to 30 minutes most days of the week. 4. Check BP 1-2 x per week at home, keep log, and bring to clinic at next appointment. 5. Follow up 3 months.

## 2017-05-08 NOTE — Assessment & Plan Note (Signed)
Pt has depression w/ slight improvement of symptoms over last 4 weeks on higher dose duloxetine and buspirone. Pt continues to note is worse seasonally, consistent w/ seasonal affective disorder.  Is still having some tearfulness, but is improved and improving anhedonia.  PHQ9 remains higher than goal, but no changes are required today.  Plan: 1. CONTINUE duloxetine 60mg  once daily. Should receive benefit for pain control and depression. 2. CONTINUE buspirone 10 mg BID. 3. Continue therapeutic counseling relationship and non-pharm coping skills. 4. Followup 3 months.

## 2017-06-11 ENCOUNTER — Other Ambulatory Visit: Payer: Self-pay

## 2017-06-11 ENCOUNTER — Ambulatory Visit (INDEPENDENT_AMBULATORY_CARE_PROVIDER_SITE_OTHER): Payer: Medicare Other | Admitting: Nurse Practitioner

## 2017-06-11 ENCOUNTER — Encounter: Payer: Self-pay | Admitting: Nurse Practitioner

## 2017-06-11 ENCOUNTER — Ambulatory Visit
Admission: RE | Admit: 2017-06-11 | Discharge: 2017-06-11 | Disposition: A | Discharge status: Home / Self Care | Payer: No Typology Code available for payment source | Source: Ambulatory Visit | Attending: Nurse Practitioner | Admitting: Nurse Practitioner

## 2017-06-11 DIAGNOSIS — M549 Dorsalgia, unspecified: Secondary | ICD-10-CM | POA: Insufficient documentation

## 2017-06-11 DIAGNOSIS — T84498A Other mechanical complication of other internal orthopedic devices, implants and grafts, initial encounter: Secondary | ICD-10-CM

## 2017-06-11 DIAGNOSIS — M25511 Pain in right shoulder: Secondary | ICD-10-CM | POA: Diagnosis not present

## 2017-06-11 DIAGNOSIS — M545 Low back pain, unspecified: Secondary | ICD-10-CM

## 2017-06-11 DIAGNOSIS — M797 Fibromyalgia: Secondary | ICD-10-CM | POA: Diagnosis not present

## 2017-06-11 DIAGNOSIS — G8929 Other chronic pain: Secondary | ICD-10-CM

## 2017-06-11 DIAGNOSIS — M542 Cervicalgia: Secondary | ICD-10-CM | POA: Diagnosis not present

## 2017-06-11 DIAGNOSIS — J3089 Other allergic rhinitis: Secondary | ICD-10-CM

## 2017-06-11 DIAGNOSIS — M5136 Other intervertebral disc degeneration, lumbar region: Secondary | ICD-10-CM | POA: Diagnosis not present

## 2017-06-11 MED ORDER — TIZANIDINE HCL 4 MG PO TABS: 8.0000 mg | ORAL_TABLET | Freq: Four times a day (QID) | ORAL | 2 refills | Status: DC | PRN

## 2017-06-11 MED ORDER — LIDO-CAPSAICIN-MEN-METHYL SAL 0.5-0.035-5-20 % EX PTCH: 1.0000 | MEDICATED_PATCH | Freq: Every day | CUTANEOUS | 1 refills | Status: DC

## 2017-06-11 MED ORDER — LIDOCAINE 5 % EX OINT: 1.0000 "application " | TOPICAL_OINTMENT | CUTANEOUS | 0 refills | Status: DC | PRN

## 2017-06-11 MED ORDER — FLUTICASONE PROPIONATE 50 MCG/ACT NA SUSP: 2.0000 | Freq: Every day | NASAL | 6 refills | Status: DC

## 2017-06-11 MED ORDER — PREDNISONE 20 MG PO TABS: ORAL_TABLET | ORAL | 0 refills | Status: DC
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
## 2017-06-11 NOTE — Addendum Note (Signed)
Addended by: Cleaster Corin on: 06/11/2017 05:34 PM   Modules accepted: Orders
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
## 2017-06-22 ENCOUNTER — Telehealth: Payer: Self-pay | Admitting: Nurse Practitioner

## 2017-06-22 NOTE — Telephone Encounter (Signed)
Pt called to check the status of referral to neuro surgery and also asked it physical therapy might help [phone]

## 2017-06-22 NOTE — Telephone Encounter (Signed)
While screws are unstable, we should not proceed with any lumbar spine physical therapy.

## 2017-06-25 NOTE — Telephone Encounter (Signed)
Attempted to contact the pt back, no answer. LMOM to return my call.  

## 2017-06-27 ENCOUNTER — Other Ambulatory Visit: Payer: Self-pay

## 2017-07-16 DIAGNOSIS — Z6830 Body mass index (BMI) 30.0-30.9, adult: Secondary | ICD-10-CM | POA: Diagnosis not present

## 2017-07-16 DIAGNOSIS — M545 Low back pain: Secondary | ICD-10-CM | POA: Diagnosis not present

## 2017-07-16 DIAGNOSIS — I10 Essential (primary) hypertension: Secondary | ICD-10-CM | POA: Diagnosis not present

## 2017-07-24 ENCOUNTER — Encounter: Payer: Self-pay | Admitting: Nurse Practitioner

## 2017-07-24 ENCOUNTER — Ambulatory Visit (INDEPENDENT_AMBULATORY_CARE_PROVIDER_SITE_OTHER): Payer: Medicare Other | Admitting: Nurse Practitioner

## 2017-07-24 ENCOUNTER — Other Ambulatory Visit: Payer: Self-pay

## 2017-07-24 VITALS — BP 152/70 | HR 83 | Temp 98.7°F | Ht 64.0 in | Wt 175.0 lb

## 2017-07-24 DIAGNOSIS — G894 Chronic pain syndrome: Secondary | ICD-10-CM

## 2017-07-24 DIAGNOSIS — F331 Major depressive disorder, recurrent, moderate: Secondary | ICD-10-CM

## 2017-07-24 DIAGNOSIS — I10 Essential (primary) hypertension: Secondary | ICD-10-CM | POA: Diagnosis not present

## 2017-07-24 DIAGNOSIS — R293 Abnormal posture: Secondary | ICD-10-CM | POA: Diagnosis not present

## 2017-07-24 DIAGNOSIS — R262 Difficulty in walking, not elsewhere classified: Secondary | ICD-10-CM | POA: Diagnosis not present

## 2017-07-24 DIAGNOSIS — M6281 Muscle weakness (generalized): Secondary | ICD-10-CM | POA: Diagnosis not present

## 2017-07-24 DIAGNOSIS — M545 Low back pain: Secondary | ICD-10-CM | POA: Diagnosis not present

## 2017-07-24 MED ORDER — LISINOPRIL 10 MG PO TABS
10.0000 mg | ORAL_TABLET | Freq: Every day | ORAL | 6 refills | Status: DC
Start: 2017-07-24 — End: 2017-10-23

## 2017-07-24 NOTE — Assessment & Plan Note (Signed)
Persistently unontrolled hypertension, worse control on days with increased pain levels.  BP goal < 130/80.  Pt is working on lifestyle modifications and pain management with last neurosurgery appointment.  Taking medications tolerating well without side effects. No current complications.  Plan: 1. INCREASE lisinopril to 10 mg once daily 2. Obtain labs 3 months.  Last kidney function normal 3 months ago.  3. Encouraged heart healthy diet and increasing exercise to 30 minutes most days of the week. 4. Check BP 1-2 x per week at home, keep log, and bring to clinic at next appointment. 5. Follow up 3 months.

## 2017-07-24 NOTE — Assessment & Plan Note (Signed)
Pt is currently waiting on pain management referral in [place].  Pain is currently not well controlled, but patient is tolerating pain.  Robaxin is helping greater than tizanidine.  Continue with follow-up pain management and neurosurgery as requested.
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
## 2017-07-24 NOTE — Assessment & Plan Note (Signed)
Stable symptoms w/ slight improvement of symptoms over last 3 months on consistent doses of medication duloxetine and buspirone.  Seasonal affective disorder now resolving with springtime weather.  Improving anhedonia.  PHQ9 remains higher than goal, but no changes are required today.  Plan: 1. CONTINUE duloxetine 60mg  once daily. Should receive benefit for pain control and depression. 2. CONTINUE buspirone 10 mg BID. 3. Continue therapeutic counseling relationship and non-pharm coping skills. 4. Followup 3 months.

## 2017-07-24 NOTE — Patient Instructions (Addendum)
April Price,   Thank you for coming in to clinic today.  1. Continue plan for pain management.  2. Continue buspirone 10 mg three times daily.  3. Continue duloxetine 60 mg daily  4. INCREASE lisinopril to 10 mg once daily.  If you are dizzy or lightheaded, call clinic and reduce dose back to 5 mg.  Please schedule a follow-up appointment with Cassell Smiles, AGNP. Return in about 3 months (around 10/23/2017) for hypertension, depression.  If you have any other questions or concerns, please feel free to call the clinic or send a message through [place]. You may also schedule an earlier appointment if necessary.  You will receive a survey after today's visit either digitally by e-mail or paper by [employer]. Your experiences and feedback matter to [place].  Please respond so we know how we are doing as we provide care for you.   Cassell Smiles, DNP, AGNP-BC Adult Gerontology Nurse Practitioner [place]

## 2017-07-26 DIAGNOSIS — R293 Abnormal posture: Secondary | ICD-10-CM | POA: Diagnosis not present

## 2017-07-26 DIAGNOSIS — M545 Low back pain: Secondary | ICD-10-CM | POA: Diagnosis not present

## 2017-07-26 DIAGNOSIS — R262 Difficulty in walking, not elsewhere classified: Secondary | ICD-10-CM | POA: Diagnosis not present

## 2017-07-26 DIAGNOSIS — M6281 Muscle weakness (generalized): Secondary | ICD-10-CM | POA: Diagnosis not present

## 2017-07-27 DIAGNOSIS — F4320 Adjustment disorder, unspecified: Secondary | ICD-10-CM | POA: Diagnosis not present

## 2017-07-31 DIAGNOSIS — R293 Abnormal posture: Secondary | ICD-10-CM | POA: Diagnosis not present

## 2017-07-31 DIAGNOSIS — M545 Low back pain: Secondary | ICD-10-CM | POA: Diagnosis not present

## 2017-07-31 DIAGNOSIS — M6281 Muscle weakness (generalized): Secondary | ICD-10-CM | POA: Diagnosis not present

## 2017-07-31 DIAGNOSIS — R262 Difficulty in walking, not elsewhere classified: Secondary | ICD-10-CM | POA: Diagnosis not present

## 2017-08-01 ENCOUNTER — Telehealth: Payer: Self-pay | Admitting: Nurse Practitioner

## 2017-08-01 DIAGNOSIS — M62838 Other muscle spasm: Secondary | ICD-10-CM

## 2017-08-01 NOTE — Telephone Encounter (Signed)
Pt's muscle spasms are worse.  Please call [phone]

## 2017-08-02 DIAGNOSIS — M62838 Other muscle spasm: Secondary | ICD-10-CM | POA: Diagnosis not present

## 2017-08-02 NOTE — Telephone Encounter (Signed)
Is now having spasms with muscle twitching "all over my body" including rib cage spasms.  Started last Wed and she was advised by a physical therapist to eat more bananas and eat mustard.  Initially, they worsened.  - States they are better today because "I didn't take my lisinopril." - Is eating 4 bananas per day and is maxed on Mag, Vit C, Vit D - Pt states she has already called neurosurgeon for advice and he recommended pt call PCP.  Recently increased dose of lisinopril at last visit from 5 mg to 10 mg daily.  Plan: - Come to clinic for CMP, Mag, Phos to evaluate kidneys, electrolytes.  Concern for possible kidney impairment vs hyperkalemia with lisinopril increased dose. - Reduce potassium intake back to normal intake. - Continue to hold lisinopril.  Will resume at lower dose of 5 mg daily once labs return and once spasms resolve. - If worsening again, may benefit from ED visit to evaluate and treat possible urgent abnormality.  Pt verbalizes understanding of above instructions.

## 2017-08-03 LAB — COMPLETE METABOLIC PANEL WITH GFR
AG Ratio: 1.4 (calc) (ref 1.0–2.5)
ALT: 10 U/L (ref 6–29)
AST: 13 U/L (ref 10–35)
Albumin: 4 g/dL (ref 3.6–5.1)
Alkaline phosphatase (APISO): 99 U/L (ref 33–130)
BUN/Creatinine Ratio: 16 (calc) (ref 6–22)
BUN: 17 mg/dL (ref 7–25)
CO2: 23 mmol/L (ref 20–32)
Calcium: 9.7 mg/dL (ref 8.6–10.4)
Chloride: 106 mmol/L (ref 98–110)
Creat: 1.07 mg/dL — ABNORMAL HIGH (ref 0.50–0.99)
GFR, Est African American: 65 mL/min/{1.73_m2} (ref >=60)
GFR, Est Non African American: 56 mL/min/{1.73_m2} — ABNORMAL LOW (ref >=60)
Globulin: 2.9 g/dL (calc) (ref 1.9–3.7)
Glucose, Bld: 97 mg/dL (ref 65–99)
Potassium: 4.4 mmol/L (ref 3.5–5.3)
Sodium: 136 mmol/L (ref 135–146)
Total Bilirubin: 0.5 mg/dL (ref 0.2–1.2)
Total Protein: 6.9 g/dL (ref 6.1–8.1)

## 2017-08-03 LAB — MAGNESIUM: Magnesium: 1.9 mg/dL (ref 1.5–2.5)

## 2017-08-03 LAB — PHOSPHORUS: Phosphorus: 4.2 mg/dL (ref 2.5–4.5)

## 2017-08-14 DIAGNOSIS — R293 Abnormal posture: Secondary | ICD-10-CM | POA: Diagnosis not present

## 2017-08-14 DIAGNOSIS — M6281 Muscle weakness (generalized): Secondary | ICD-10-CM | POA: Diagnosis not present

## 2017-08-14 DIAGNOSIS — M545 Low back pain: Secondary | ICD-10-CM | POA: Diagnosis not present

## 2017-08-14 DIAGNOSIS — R262 Difficulty in walking, not elsewhere classified: Secondary | ICD-10-CM | POA: Diagnosis not present

## 2017-08-15 DIAGNOSIS — F4320 Adjustment disorder, unspecified: Secondary | ICD-10-CM | POA: Diagnosis not present

## 2017-08-21 ENCOUNTER — Other Ambulatory Visit: Payer: Self-pay | Admitting: Nurse Practitioner

## 2017-08-31 DIAGNOSIS — M797 Fibromyalgia: Secondary | ICD-10-CM | POA: Diagnosis not present

## 2017-08-31 DIAGNOSIS — M7062 Trochanteric bursitis, left hip: Secondary | ICD-10-CM | POA: Diagnosis not present

## 2017-08-31 DIAGNOSIS — M545 Low back pain: Secondary | ICD-10-CM | POA: Diagnosis not present

## 2017-09-06 DIAGNOSIS — F4320 Adjustment disorder, unspecified: Secondary | ICD-10-CM | POA: Diagnosis not present

## 2017-10-03 DIAGNOSIS — F4320 Adjustment disorder, unspecified: Secondary | ICD-10-CM | POA: Diagnosis not present

## 2017-10-15 ENCOUNTER — Other Ambulatory Visit: Payer: Self-pay | Admitting: Nurse Practitioner

## 2017-10-23 ENCOUNTER — Encounter: Payer: Self-pay | Admitting: Nurse Practitioner

## 2017-10-23 ENCOUNTER — Other Ambulatory Visit: Payer: Self-pay

## 2017-10-23 ENCOUNTER — Ambulatory Visit (INDEPENDENT_AMBULATORY_CARE_PROVIDER_SITE_OTHER): Payer: Medicare Other | Admitting: Nurse Practitioner

## 2017-10-23 VITALS — BP 118/54 | HR 84 | Temp 98.1°F | Resp 18 | Ht 64.0 in | Wt 182.0 lb

## 2017-10-23 DIAGNOSIS — K219 Gastro-esophageal reflux disease without esophagitis: Secondary | ICD-10-CM

## 2017-10-23 DIAGNOSIS — M7918 Myalgia, other site: Secondary | ICD-10-CM | POA: Diagnosis not present

## 2017-10-23 DIAGNOSIS — G8929 Other chronic pain: Secondary | ICD-10-CM | POA: Diagnosis not present

## 2017-10-23 DIAGNOSIS — F3289 Other specified depressive episodes: Secondary | ICD-10-CM | POA: Diagnosis not present

## 2017-10-23 DIAGNOSIS — M79604 Pain in right leg: Secondary | ICD-10-CM | POA: Diagnosis not present

## 2017-10-23 DIAGNOSIS — M79605 Pain in left leg: Secondary | ICD-10-CM | POA: Diagnosis not present

## 2017-10-23 DIAGNOSIS — M542 Cervicalgia: Secondary | ICD-10-CM

## 2017-10-23 DIAGNOSIS — M797 Fibromyalgia: Secondary | ICD-10-CM | POA: Diagnosis not present

## 2017-10-23 MED ORDER — DICLOFENAC SODIUM 75 MG PO TBEC: 75.0000 mg | DELAYED_RELEASE_TABLET | Freq: Two times a day (BID) | ORAL | 5 refills | Status: DC

## 2017-10-23 MED ORDER — BUSPIRONE HCL 5 MG PO TABS: 5.0000 mg | ORAL_TABLET | Freq: Two times a day (BID) | ORAL | 5 refills | Status: DC

## 2017-10-23 MED ORDER — RANITIDINE HCL 150 MG PO TABS: 150.0000 mg | ORAL_TABLET | Freq: Two times a day (BID) | ORAL | 12 refills | Status: DC

## 2017-10-23 NOTE — Progress Notes (Signed)
Subjective:    Patient ID: April Price, female    DOB: 09-30-1955, 62 y.o.   MRN: [id number]  April Price is a 62 y.o. female presenting on 10/23/2017 for Hypertension and Depression   HPI Hypertension - She is not checking BP at home or outside of clinic.    - Current medications: lisinopril 10 mg once daily is prescribed (increased from 5 mg last visit), but patient has stopped taking this.  Reports her pain has significantly reduced over warmer months.  She has also had additional care by Dr. Brayton Mars pain management. He diagnosed her with left hip bursitis and now 60% better after corticosteroid injection 2 months ago.    Depression Patient reports for followup of depression. She is taking duloxetine 60 mg once daily, buspirone 10 mg tid by prescription, but is taking only if she gets stressed 1-2 times per week.  She reports that it does help her symptoms when she takes it.   - Overall, patient reports symptoms have significantly improved over summer months with improvement in pain. - Patient continues to see psychology at [facility] every 3-6 weeks.   Depression screen [facility] 2/9 10/23/2017 07/24/2017 04/19/2017 03/26/2017 02/13/2017  Decreased Interest 0 0 0 1 3  Down, Depressed, Hopeless 0 0 1 2 2   PHQ - 2 Score 0 0 1 3 5   Altered sleeping 0 2 2 3 3   Tired, decreased energy 3 3 2 2 3   Change in appetite 0 0 1 1 1   Feeling bad or failure about yourself  0 0 0 0 0  Trouble concentrating 0 0 0 2 3  Moving slowly or fidgety/restless 0 0 0 0 2  Suicidal thoughts 0 0 0 0 0  PHQ-9 Score 3 5 6 11 17   Difficult doing work/chores Not difficult at all Not difficult at all Somewhat difficult Somewhat difficult Extremely dIfficult     Chantix Patient was started on Chantix by Dr. Brayton Mars with pain management.  She is just finishing her first month starting pack.  Nausea is biggest side effect.  Is taking with food.  Notes no worsening depression, SI, or vivid dreams.  Patient  has reduced smoking to 1/4 ppd.  She still has significant cigarette cravings in early am and after eating, which is consistent with prior attempts to quit smoking.     Chronic pain Patient reports significantly improved chronic pain with warmer weather.  Dr. Maryjean Ka has also placed corticosteroid injections in lumbar spine and left hip.  Patient continues to take duloxetine 60 mg once daily, tizanidine 4 mg 4 times daily as needed spasm, ibuprofen 800 mg 3 times daily.  Patient also takes ranitidine 150 mg twice daily and Nexium 40 mg daily to prevent GERD and PUD.  -Patient reports no prior success with pain management using meloxicam.   Social History   Tobacco Use  . Smoking status: Current Every Day Smoker    Packs/day: 0.25    Years: 40.00    Pack years: 10.00    Types: Cigarettes  . Smokeless tobacco: Never Used  Substance Use Topics  . Alcohol use: No  . Drug use: No    Review of Systems Per HPI unless specifically indicated above     Objective:    BP (!) 118/54 (BP Location: Right Arm, Patient Position: Sitting, Cuff Size: Normal)   Pulse 84   Temp 98.1 F (36.7 C) (Oral)   Resp 18   Ht 5\' 4"  (1.626 m)  Wt 182 lb (82.6 kg)   SpO2 98%   BMI 31.24 kg/m   Wt Readings from Last 3 Encounters:  10/23/17 182 lb (82.6 kg)  07/24/17 175 lb (79.4 kg)  06/11/17 171 lb 12.8 oz (77.9 kg)    Physical Exam  Constitutional: She is oriented to person, place, and time. She appears well-developed and well-nourished. No distress.  HENT:  Head: Normocephalic and atraumatic.  Cardiovascular: Normal rate, regular rhythm, S1 normal, S2 normal, normal heart sounds and intact distal pulses.  Pulmonary/Chest: Effort normal and breath sounds normal. No respiratory distress.  Abdominal: Soft. Bowel sounds are normal.  Neurological: She is alert and oriented to person, place, and time.  Skin: Skin is warm and dry. Capillary refill takes less than 2 seconds.  Psychiatric: She has a  normal mood and affect. Her behavior is normal. Judgment and thought content normal.  Vitals reviewed.    Results for orders placed or performed in visit on 08/01/17  COMPLETE METABOLIC PANEL WITH GFR  Result Value Ref Range   Glucose, Bld 97 65 - 99 mg/dL   BUN 17 7 - 25 mg/dL   Creat 1.07 (H) 0.50 - 0.99 mg/dL   GFR, Est Non African American 56 (L) > OR = 60 mL/min/1.79m2   GFR, Est African American 65 > OR = 60 mL/min/1.16m2   BUN/Creatinine Ratio 16 6 - 22 (calc)   Sodium 136 135 - 146 mmol/L   Potassium 4.4 3.5 - 5.3 mmol/L   Chloride 106 98 - 110 mmol/L   CO2 23 20 - 32 mmol/L   Calcium 9.7 8.6 - 10.4 mg/dL   Total Protein 6.9 6.1 - 8.1 g/dL   Albumin 4.0 3.6 - 5.1 g/dL   Globulin 2.9 1.9 - 3.7 g/dL (calc)   AG Ratio 1.4 1.0 - 2.5 (calc)   Total Bilirubin 0.5 0.2 - 1.2 mg/dL   Alkaline phosphatase (APISO) 99 33 - 130 U/L   AST 13 10 - 35 U/L   ALT 10 6 - 29 U/L  Magnesium  Result Value Ref Range   Magnesium 1.9 1.5 - 2.5 mg/dL  Phosphorus  Result Value Ref Range   Phosphorus 4.2 2.5 - 4.5 mg/dL      Assessment & Plan:   Problem List Items Addressed This Visit      Other   Fibromyalgia (Chronic)   Chronic neck pain (Chronic)   Relevant Medications   diclofenac (VOLTAREN) 75 MG EC tablet   Chronic lower extremity pain (Bilateral) (L>R) (Chronic)   Relevant Medications   diclofenac (VOLTAREN) 75 MG EC tablet   Musculoskeletal pain (Chronic)   Relevant Medications   diclofenac (VOLTAREN) 75 MG EC tablet   Depression   Relevant Medications   busPIRone (BUSPAR) 5 MG tablet    Other Visit Diagnoses    Gastroesophageal reflux disease, esophagitis presence not specified    -  Primary   Relevant Medications   ranitidine (ZANTAC) 150 MG tablet      #Hypertension: Resolved.  Is secondary to uncontrolled pain.  STOP lisionpril.  Consider resuming in winter months when pain is more difficult to control.  Leg cramps occur with lisinopril, so consider alternative  agent in future.  Followup October.  #Depression: stable with summer weather and improved pain.  Patient taking duloxetine daily, but is not taking buspirone regularly.  Patient will likely need this augmentation during winter months more than summer.   - Continue duloxetine 60 mg once daily - Take buspirone 5 mg  bid daily.   - Continue staying active in summer, regular activity to reduce pain also as weather cools. - Followup 3 months.  #Chronic Pain:Improved with summer weather and recent corticosteroid hip injections and spine injection per Dr. Brayton Mars.  Continued relief with duloxetine.  Patient currently very happy about current pain control level.  Patient taking ibuprofen 800 mg tid every day.  High risk of PUD and GI bleed.  STOP ibuprofen.  START diclofenac 75 mg twice daily prn.  Encourage occasionally reducing or skipping a dose for prostaglandin recovery. Followup prn and with Dr. Brayton Mars.  #GERD: stable on ranidine 150 mg bid and PPI.  Reduce NSAID dose.  Change to diclofenac as above.  Monitor for GI bleeding.  No current symptoms.  Followup 3 months.   Meds ordered this encounter  Medications  . busPIRone (BUSPAR) 5 MG tablet    Sig: Take 1 tablet (5 mg total) by mouth 2 (two) times daily. Taking as needed    Dispense:  60 tablet    Refill:  5    Fill when patient requests refill.  She has supply currently.    Order Specific Question:   Supervising Provider    Answer:   Olin Hauser [[id number]]  . ranitidine (ZANTAC) 150 MG tablet    Sig: Take 1 tablet (150 mg total) by mouth 2 (two) times daily.    Dispense:  60 tablet    Refill:  12    Order Specific Question:   Supervising Provider    Answer:   Olin Hauser [[id number]]  . diclofenac (VOLTAREN) 75 MG EC tablet    Sig: Take 1 tablet (75 mg total) by mouth 2 (two) times daily.    Dispense:  60 tablet    Refill:  5    Order Specific Question:   Supervising Provider    Answer:   Olin Hauser  [[id number]]    Follow up plan: Return in about 3 months (around 01/23/2018) for depression AND after 02/13/2018.  April Smiles, DNP, AGPCNP-BC Adult Gerontology Primary Care Nurse Practitioner [facility] 10/23/2017, 8:08 AM

## 2017-10-23 NOTE — Patient Instructions (Addendum)
April Price,   Thank you for coming in to clinic today.  1. Continue Chantix.  You are doing well.  Keep working to reduce smoking by at least 2 cigarettes per week until you are quit. We can continue this longer if you need.  Call clinic for a 1 month continuation if needed before your next appointment.  2. Change buspirone to 5 mg twice daily and take every day.  3. Continue duloxetine 60 mg once daily.  4. STOP ibuprofen - START diclofenac 75 mg tablet twice daily.  Take for at least 2 weeks.  If not continuing to control pain, call clinic and we can resume your ibuprofen.  Please schedule a follow-up appointment with Cassell Smiles, AGNP. Return in about 3 months (around 01/23/2018) for depression AND after 02/13/2018.  If you have any other questions or concerns, please feel free to call the clinic or send a message through [place]. You may also schedule an earlier appointment if necessary.  You will receive a survey after today's visit either digitally by e-mail or paper by [employer]. Your experiences and feedback matter to [place].  Please respond so we know how we are doing as we provide care for you.   Cassell Smiles, DNP, AGNP-BC Adult Gerontology Nurse Practitioner [employer]

## 2017-10-24 ENCOUNTER — Encounter: Payer: Self-pay | Admitting: Nurse Practitioner

## 2017-11-01 DIAGNOSIS — Z6833 Body mass index (BMI) 33.0-33.9, adult: Secondary | ICD-10-CM | POA: Diagnosis not present

## 2017-11-01 DIAGNOSIS — M797 Fibromyalgia: Secondary | ICD-10-CM | POA: Diagnosis not present

## 2017-11-01 DIAGNOSIS — M7062 Trochanteric bursitis, left hip: Secondary | ICD-10-CM | POA: Diagnosis not present

## 2017-11-01 DIAGNOSIS — M545 Low back pain: Secondary | ICD-10-CM | POA: Diagnosis not present

## 2017-11-01 DIAGNOSIS — R030 Elevated blood-pressure reading, without diagnosis of hypertension: Secondary | ICD-10-CM | POA: Diagnosis not present

## 2017-11-08 DIAGNOSIS — F4320 Adjustment disorder, unspecified: Secondary | ICD-10-CM | POA: Diagnosis not present

## 2017-12-05 DIAGNOSIS — H353131 Nonexudative age-related macular degeneration, bilateral, early dry stage: Secondary | ICD-10-CM | POA: Diagnosis not present

## 2017-12-15 ENCOUNTER — Other Ambulatory Visit: Payer: Self-pay | Admitting: Nurse Practitioner

## 2017-12-18 DIAGNOSIS — F4320 Adjustment disorder, unspecified: Secondary | ICD-10-CM | POA: Diagnosis not present

## 2018-01-09 DIAGNOSIS — Z961 Presence of intraocular lens: Secondary | ICD-10-CM | POA: Diagnosis not present

## 2018-01-10 DIAGNOSIS — M791 Myalgia, unspecified site: Secondary | ICD-10-CM | POA: Diagnosis not present

## 2018-01-10 DIAGNOSIS — Z6833 Body mass index (BMI) 33.0-33.9, adult: Secondary | ICD-10-CM | POA: Diagnosis not present

## 2018-01-10 DIAGNOSIS — M797 Fibromyalgia: Secondary | ICD-10-CM | POA: Diagnosis not present

## 2018-01-10 DIAGNOSIS — I10 Essential (primary) hypertension: Secondary | ICD-10-CM | POA: Diagnosis not present

## 2018-01-15 DIAGNOSIS — F4320 Adjustment disorder, unspecified: Secondary | ICD-10-CM | POA: Diagnosis not present

## 2018-01-29 ENCOUNTER — Ambulatory Visit: Payer: Medicare Other | Admitting: Nurse Practitioner

## 2018-01-30 ENCOUNTER — Ambulatory Visit (INDEPENDENT_AMBULATORY_CARE_PROVIDER_SITE_OTHER): Payer: Medicare Other | Admitting: Nurse Practitioner

## 2018-01-30 ENCOUNTER — Encounter: Payer: Self-pay | Admitting: Nurse Practitioner

## 2018-01-30 ENCOUNTER — Other Ambulatory Visit: Payer: Self-pay

## 2018-01-30 VITALS — BP 134/71 | HR 95 | Temp 98.2°F | Resp 18 | Ht 64.0 in | Wt 184.5 lb

## 2018-01-30 DIAGNOSIS — G894 Chronic pain syndrome: Secondary | ICD-10-CM

## 2018-01-30 DIAGNOSIS — Z23 Encounter for immunization: Secondary | ICD-10-CM | POA: Diagnosis not present

## 2018-01-30 DIAGNOSIS — J410 Simple chronic bronchitis: Secondary | ICD-10-CM | POA: Diagnosis not present

## 2018-01-30 DIAGNOSIS — F3289 Other specified depressive episodes: Secondary | ICD-10-CM | POA: Diagnosis not present

## 2018-01-30 DIAGNOSIS — H353130 Nonexudative age-related macular degeneration, bilateral, stage unspecified: Secondary | ICD-10-CM | POA: Diagnosis not present

## 2018-01-30 DIAGNOSIS — H353190 Nonexudative age-related macular degeneration, unspecified eye, stage unspecified: Secondary | ICD-10-CM | POA: Insufficient documentation

## 2018-01-30 MED ORDER — BUSPIRONE HCL 5 MG PO TABS: 5.0000 mg | ORAL_TABLET | Freq: Two times a day (BID) | ORAL | 5 refills | Status: DC

## 2018-01-30 MED ORDER — DULOXETINE HCL 60 MG PO CPEP: 60.0000 mg | ORAL_CAPSULE | Freq: Every day | ORAL | 5 refills | Status: DC

## 2018-01-30 MED ORDER — ALBUTEROL SULFATE HFA 108 (90 BASE) MCG/ACT IN AERS: 2.0000 | INHALATION_SPRAY | RESPIRATORY_TRACT | 12 refills | Status: DC | PRN

## 2018-01-30 MED ORDER — ALBUTEROL SULFATE HFA 108 (90 BASE) MCG/ACT IN AERS: 2.0000 | INHALATION_SPRAY | RESPIRATORY_TRACT | 2 refills | Status: DC | PRN
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
## 2018-01-30 NOTE — Patient Instructions (Addendum)
April Price,   Thank you for coming in to clinic today.  1. Use aspercreme or generic with lidocaine over the counter instead of prescription lidocaine.  2. For depression: go ahead and resume your buspar in preparation for your worsening  3. Schedule trigger point injection visit with Dr. Raliegh Ip in about 1-3 weeks.  4. After trigger point injections, consider physical therapy for RIGHT shoulder pain.  May also consider tramadol with increased pain as needed during winter. - This will help give you a break from ibuprofen.  Please schedule a follow-up appointment with Cassell Smiles, AGNP. Return in about 3 months (around 05/02/2018) for depression or sooner if needed.  If you have any other questions or concerns, please feel free to call the clinic or send a message through [place]. You may also schedule an earlier appointment if necessary.  You will receive a survey after today's visit either digitally by e-mail or paper by [employer]. Your experiences and feedback matter to [place].  Please respond so we know how we are doing as we provide care for you.   Cassell Smiles, DNP, AGNP-BC Adult Gerontology Nurse Practitioner [place]

## 2018-02-05 ENCOUNTER — Encounter: Payer: Self-pay | Admitting: Nurse Practitioner

## 2018-02-11 ENCOUNTER — Ambulatory Visit (INDEPENDENT_AMBULATORY_CARE_PROVIDER_SITE_OTHER): Payer: Medicare Other | Admitting: Family Medicine

## 2018-02-11 ENCOUNTER — Encounter: Payer: Self-pay | Admitting: Family Medicine

## 2018-02-11 VITALS — BP 131/68 | HR 92 | Temp 98.2°F | Resp 16 | Ht 64.0 in | Wt 185.0 lb

## 2018-02-11 DIAGNOSIS — M25512 Pain in left shoulder: Secondary | ICD-10-CM | POA: Diagnosis not present

## 2018-02-11 DIAGNOSIS — M25559 Pain in unspecified hip: Secondary | ICD-10-CM | POA: Diagnosis not present

## 2018-02-11 DIAGNOSIS — M79609 Pain in unspecified limb: Secondary | ICD-10-CM | POA: Diagnosis not present

## 2018-02-11 DIAGNOSIS — M62830 Muscle spasm of back: Secondary | ICD-10-CM | POA: Diagnosis not present

## 2018-02-11 DIAGNOSIS — M25511 Pain in right shoulder: Secondary | ICD-10-CM | POA: Diagnosis not present

## 2018-02-11 DIAGNOSIS — G8929 Other chronic pain: Secondary | ICD-10-CM | POA: Diagnosis not present

## 2018-02-11 DIAGNOSIS — G894 Chronic pain syndrome: Secondary | ICD-10-CM | POA: Diagnosis not present

## 2018-02-11 MED ORDER — LIDOCAINE HCL (PF) 1 % IJ SOLN
6.0000 mL | Freq: Once | INTRAMUSCULAR | Status: AC
  Administered 2018-02-11: 6 mL via INTRADERMAL

## 2018-02-11 NOTE — Patient Instructions (Addendum)
Thank you for coming to the office today.  You received multiple trigger point muscle injections today with lidocaine / 1% epi - without steroid - Lidocaine numbing medicine may ease the pain initially for a few hours until it wears off - the goal as you know is the relax the muscle tension areas - We will determine how successful these initial injections are and adjust our strategy in future - Try to take it easy for next day, avoid over activity and strain on joint (limit lifting for shoulder) - Recommend the following:   - For pain in future may use heating pad or moist heat as needed  Medication - Contiue current medications - may follow-up with pain doctors / PCP for future med changes  If you develop unusual redness worse pain, swelling of skin or drainage of pus, fever or chills - there is a risk that due to injection may get superficial skin infection, please return or seek care immediately if this happens.  Please schedule a Follow-up Appointment to: Return in about 4 weeks (around 03/11/2018), or if symptoms worsen or fail to improve, for trigger point injection / pain.  If you have any other questions or concerns, please feel free to call the office or send a message through [place]. You may also schedule an earlier appointment if necessary.  Additionally, you may be receiving a survey about your experience at our office within a few days to 1 week by e-mail or mail. We value your feedback.  Nobie Putnam, DO [place]
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
## 2018-02-27 DIAGNOSIS — F4320 Adjustment disorder, unspecified: Secondary | ICD-10-CM | POA: Diagnosis not present

## 2018-03-12 DIAGNOSIS — F4320 Adjustment disorder, unspecified: Secondary | ICD-10-CM | POA: Diagnosis not present

## 2018-03-18 ENCOUNTER — Ambulatory Visit (INDEPENDENT_AMBULATORY_CARE_PROVIDER_SITE_OTHER): Payer: Medicare Other | Admitting: Family Medicine

## 2018-03-18 ENCOUNTER — Encounter: Payer: Self-pay | Admitting: Family Medicine

## 2018-03-18 VITALS — BP 117/56 | HR 96 | Temp 98.8°F | Resp 16 | Ht 64.0 in | Wt 183.0 lb

## 2018-03-18 DIAGNOSIS — M25512 Pain in left shoulder: Secondary | ICD-10-CM | POA: Diagnosis not present

## 2018-03-18 DIAGNOSIS — M62830 Muscle spasm of back: Secondary | ICD-10-CM | POA: Diagnosis not present

## 2018-03-18 DIAGNOSIS — G894 Chronic pain syndrome: Secondary | ICD-10-CM | POA: Diagnosis not present

## 2018-03-18 DIAGNOSIS — G8929 Other chronic pain: Secondary | ICD-10-CM

## 2018-03-18 DIAGNOSIS — M25511 Pain in right shoulder: Secondary | ICD-10-CM | POA: Diagnosis not present

## 2018-03-18 MED ORDER — LIDOCAINE HCL (PF) 1 % IJ SOLN
6.0000 mL | Freq: Once | INTRAMUSCULAR | Status: AC
  Administered 2018-03-18: 6 mL

## 2018-03-18 NOTE — Patient Instructions (Addendum)
Thank you for coming to the office today.  You received multiple trigger point muscle injections today with lidocaine / 1% epi - without steroid - Lidocaine numbing medicine may ease the pain initially for a few hours until it wears off - the goal as you know is the relax the muscle tension areas - We will determine how successful these initial injections are and adjust our strategy in future - Try to take it easy for next day, avoid over activity and strain on joint (limit lifting for shoulder) - Recommend the following:   - For pain in future may use heating pad or moist heat as needed  Medication - Contiue current medications - may follow-up with pain doctors / PCP for future med changes  If you develop unusual redness worse pain, swelling of skin or drainage of pus, fever or chills - there is a risk that due to injection may get superficial skin infection, please return or seek care immediately if this happens.  Please schedule a Follow-up Appointment to: Return if symptoms worsen or fail to improve, for 6-8 wk for trigger point injection.  If you have any other questions or concerns, please feel free to call the office or send a message through [place]. You may also schedule an earlier appointment if necessary.  Additionally, you may be receiving a survey about your experience at our office within a few days to 1 week by e-mail or mail. We value your feedback.  Nobie Putnam, DO [place]
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
## 2018-03-25 ENCOUNTER — Other Ambulatory Visit: Payer: Self-pay | Admitting: Nurse Practitioner

## 2018-03-28 DIAGNOSIS — F4320 Adjustment disorder, unspecified: Secondary | ICD-10-CM | POA: Diagnosis not present

## 2018-05-02 ENCOUNTER — Other Ambulatory Visit: Payer: Self-pay

## 2018-05-02 ENCOUNTER — Encounter: Payer: Self-pay | Admitting: Nurse Practitioner

## 2018-05-02 ENCOUNTER — Ambulatory Visit: Payer: Medicare Other | Admitting: Nurse Practitioner

## 2018-05-02 ENCOUNTER — Ambulatory Visit (INDEPENDENT_AMBULATORY_CARE_PROVIDER_SITE_OTHER): Payer: PPO | Admitting: Nurse Practitioner

## 2018-05-02 VITALS — BP 144/64 | Temp 98.3°F | Resp 18 | Ht 64.0 in | Wt 174.8 lb

## 2018-05-02 DIAGNOSIS — Z1231 Encounter for screening mammogram for malignant neoplasm of breast: Secondary | ICD-10-CM | POA: Diagnosis not present

## 2018-05-02 DIAGNOSIS — K219 Gastro-esophageal reflux disease without esophagitis: Secondary | ICD-10-CM | POA: Diagnosis not present

## 2018-05-02 DIAGNOSIS — Z889 Allergy status to unspecified drugs, medicaments and biological substances status: Secondary | ICD-10-CM | POA: Diagnosis not present

## 2018-05-02 DIAGNOSIS — F3289 Other specified depressive episodes: Secondary | ICD-10-CM | POA: Diagnosis not present

## 2018-05-02 DIAGNOSIS — G894 Chronic pain syndrome: Secondary | ICD-10-CM | POA: Diagnosis not present

## 2018-05-02 DIAGNOSIS — M25512 Pain in left shoulder: Secondary | ICD-10-CM | POA: Diagnosis not present

## 2018-05-02 DIAGNOSIS — J410 Simple chronic bronchitis: Secondary | ICD-10-CM | POA: Diagnosis not present

## 2018-05-02 DIAGNOSIS — M25559 Pain in unspecified hip: Secondary | ICD-10-CM

## 2018-05-02 DIAGNOSIS — Z87892 Personal history of anaphylaxis: Secondary | ICD-10-CM | POA: Diagnosis not present

## 2018-05-02 DIAGNOSIS — G8929 Other chronic pain: Secondary | ICD-10-CM | POA: Diagnosis not present

## 2018-05-02 DIAGNOSIS — M25511 Pain in right shoulder: Secondary | ICD-10-CM

## 2018-05-02 MED ORDER — DULOXETINE HCL 60 MG PO CPEP: 60.0000 mg | ORAL_CAPSULE | Freq: Every day | ORAL | 1 refills | Status: DC

## 2018-05-02 MED ORDER — LIDOCAINE HCL (PF) 1 % IJ SOLN
6.0000 mL | Freq: Once | INTRAMUSCULAR | Status: AC
  Administered 2018-05-02: 6 mL

## 2018-05-02 MED ORDER — LIDOCAINE HCL 1 % IJ SOLN: 6.0000 mL | Freq: Once | INTRAMUSCULAR | Status: DC

## 2018-05-02 MED ORDER — TIZANIDINE HCL 4 MG PO TABS: ORAL_TABLET | ORAL | 1 refills | Status: DC

## 2018-05-02 MED ORDER — IBUPROFEN 800 MG PO TABS: 800.0000 mg | ORAL_TABLET | Freq: Three times a day (TID) | ORAL | 3 refills | Status: DC

## 2018-05-02 MED ORDER — ALBUTEROL SULFATE HFA 108 (90 BASE) MCG/ACT IN AERS: 2.0000 | INHALATION_SPRAY | RESPIRATORY_TRACT | 1 refills | Status: AC | PRN

## 2018-05-02 MED ORDER — EPINEPHRINE 0.3 MG/0.3ML IJ SOAJ: 0.3000 mg | Freq: Once | INTRAMUSCULAR | 12 refills | Status: DC

## 2018-05-02 MED ORDER — ESOMEPRAZOLE MAGNESIUM 40 MG PO CPDR: 40.0000 mg | DELAYED_RELEASE_CAPSULE | Freq: Every day | ORAL | 4 refills | Status: DC

## 2018-05-02 MED ORDER — BUSPIRONE HCL 10 MG PO TABS: 10.0000 mg | ORAL_TABLET | Freq: Three times a day (TID) | ORAL | 1 refills | Status: DC | PRN

## 2018-05-02 MED ORDER — AZELASTINE HCL 0.1 % NA SOLN: 2.0000 | Freq: Two times a day (BID) | NASAL | 5 refills | Status: DC | PRN

## 2018-05-02 NOTE — Progress Notes (Signed)
Subjective:    Patient ID: April Price, female    DOB: 12-27-1955, 63 y.o.   MRN: [id number]  Ted Leonhart is a 63 y.o. female presenting on 05/02/2018 for Back Pain (chronic pain in the back and left hip. The pt comes in today for a trigger point injection) and Depression  HPI Chronic Pain RIGHT shoulder near Cspine caused increased pain with other disc disease. Hip pain returns.  Left shoulder blade, radiates to arm. Right shoulder at top. Occasionally has muscle spasms in rib cage.   Most recent injections lasted a full 4 weeks.  Is possible continuing regular trigger point injections will bring longer duration of relief. - Increasing pain is contributing to disruption of sleep and mildly worsening depression.  Overall with cold weather is feeling better than usual.  Depression Pain is keeping her awake/wakes her up.  Has no difficulty going to sleep with ibuprofen and tizanidine.  Over last week has had leg cramps.    Did not do leg/hip trigger point last visit.   GAD 7 : Generalized Anxiety Score 05/02/2018 10/23/2017  Nervous, Anxious, on Edge 0 0  Control/stop worrying 0 0  Worry too much - different things 0 0  Trouble relaxing 1 0  Restless 0 0  Easily annoyed or irritable 0 0  Afraid - awful might happen 0 0  Total GAD 7 Score 1 0  Anxiety Difficulty Not difficult at all Not difficult at all   Depression screen [facility] 2/9 05/02/2018 03/18/2018 02/11/2018 01/30/2018 10/23/2017  Decreased Interest 0 0 0 1 0  Down, Depressed, Hopeless 0 0 0 1 0  PHQ - 2 Score 0 0 0 2 0  Altered sleeping 1 - 0 1 0  Tired, decreased energy 3 0 0 0 3  Change in appetite 0 0 0 0 0  Feeling bad or failure about yourself  0 0 0 0 0  Trouble concentrating 1 0 0 0 0  Moving slowly or fidgety/restless 0 0 0 0 0  Suicidal thoughts 0 0 0 0 0  PHQ-9 Score 5 - 0 3 3  Difficult doing work/chores Somewhat difficult Not difficult at all Not difficult at all Somewhat difficult Not difficult  at all  Some recent data might be hidden   Social History   Tobacco Use  . Smoking status: Current Every Day Smoker    Packs/day: 0.50    Years: 40.00    Pack years: 20.00    Types: Cigarettes  . Smokeless tobacco: Current User  Substance Use Topics  . Alcohol use: No  . Drug use: No    Review of Systems Per HPI unless specifically indicated above     Objective:    BP (!) 144/64 (BP Location: Left Arm, Patient Position: Sitting, Cuff Size: Normal)   Temp 98.3 F (36.8 C) (Oral)   Resp 18   Ht 5\' 4"  (1.626 m)   Wt 174 lb 12.8 oz (79.3 kg)   SpO2 97%   BMI 30.00 kg/m   Wt Readings from Last 3 Encounters:  05/02/18 174 lb 12.8 oz (79.3 kg)  03/18/18 183 lb (83 kg)  02/11/18 185 lb (83.9 kg)    Physical Exam Vitals signs reviewed.  Constitutional:      General: She is not in acute distress.    Appearance: She is well-developed.  HENT:     Head: Normocephalic and atraumatic.  Cardiovascular:     Rate and Rhythm: Normal rate and regular rhythm.  Pulses:          Radial pulses are 2+ on the right side and 2+ on the left side.       Posterior tibial pulses are 1+ on the right side and 1+ on the left side.     Heart sounds: Normal heart sounds, S1 normal and S2 normal.  Pulmonary:     Effort: Pulmonary effort is normal. No respiratory distress.     Breath sounds: Normal breath sounds and air entry.  Musculoskeletal:     Right lower leg: No edema.     Left lower leg: No edema.     Comments: Well defined tender trigger points located at: 1- Left subscap 2- Right levator scapulae 3- LEFT lateral hip  Skin:    General: Skin is warm and dry.     Capillary Refill: Capillary refill takes less than 2 seconds.  Neurological:     Mental Status: She is alert and oriented to person, place, and time.  Psychiatric:        Attention and Perception: Attention normal.        Mood and Affect: Mood and affect normal.        Behavior: Behavior normal. Behavior is  cooperative.     Results for orders placed or performed in visit on 08/01/17  COMPLETE METABOLIC PANEL WITH GFR  Result Value Ref Range   Glucose, Bld 97 65 - 99 mg/dL   BUN 17 7 - 25 mg/dL   Creat 1.07 (H) 0.50 - 0.99 mg/dL   GFR, Est Non African American 56 (L) > OR = 60 mL/min/1.80m2   GFR, Est African American 65 > OR = 60 mL/min/1.40m2   BUN/Creatinine Ratio 16 6 - 22 (calc)   Sodium 136 135 - 146 mmol/L   Potassium 4.4 3.5 - 5.3 mmol/L   Chloride 106 98 - 110 mmol/L   CO2 23 20 - 32 mmol/L   Calcium 9.7 8.6 - 10.4 mg/dL   Total Protein 6.9 6.1 - 8.1 g/dL   Albumin 4.0 3.6 - 5.1 g/dL   Globulin 2.9 1.9 - 3.7 g/dL (calc)   AG Ratio 1.4 1.0 - 2.5 (calc)   Total Bilirubin 0.5 0.2 - 1.2 mg/dL   Alkaline phosphatase (APISO) 99 33 - 130 U/L   AST 13 10 - 35 U/L   ALT 10 6 - 29 U/L  Magnesium  Result Value Ref Range   Magnesium 1.9 1.5 - 2.5 mg/dL  Phosphorus  Result Value Ref Range   Phosphorus 4.2 2.5 - 4.5 mg/dL      Assessment & Plan:   Problem List Items Addressed This Visit      Other   Chronic pain syndrome (Chronic)-  Primary Patient with chronic pain and moderate relief with trigger point injections.  This is helping better control depression and blood pressure this winter.  Pain always worst during cold months.  Repeat trigger point injections today 6 weeks after last injections performed by myself and Dr. Parks Ranger.   - See procedure note below for three trigger point injections.  Used 2 mL lidocaine 1% in each trigger point. - Follow-up 6 weeks for additional injections prn.   Relevant Medications   tiZANidine (ZANAFLEX) 4 MG tablet   ibuprofen (ADVIL,MOTRIN) 800 MG tablet   lidocaine (PF) (XYLOCAINE) 1 % injection 6 mL (Completed)   Chronic shoulder pain (Location of Primary Source of Pain) (Bilateral) (L>R) (Chronic)   Relevant Medications   DULoxetine (CYMBALTA) 60 MG capsule  tiZANidine (ZANAFLEX) 4 MG tablet   ibuprofen (ADVIL,MOTRIN) 800 MG tablet     lidocaine (PF) (XYLOCAINE) 1 % injection 6 mL (Completed)   Chronic hip pain (Location of Secondary source of pain) (Bilateral) (L>R) (Chronic)   Relevant Medications   DULoxetine (CYMBALTA) 60 MG capsule   tiZANidine (ZANAFLEX) 4 MG tablet   ibuprofen (ADVIL,MOTRIN) 800 MG tablet   lidocaine (PF) (XYLOCAINE) 1 % injection 6 mL (Completed)   Depression Stable currently. Complicated by pain.  Treat pain today with trigger point injections as above and procedure note below.   Relevant Medications   busPIRone (BUSPAR) 10 MG tablet   DULoxetine (CYMBALTA) 60 MG capsule   History of seasonal allergies Patient requests refill.  Refill provided.   Relevant Medications   azelastine (ASTELIN) 0.1 % nasal spray    Other Visit Diagnoses    History of anaphylaxis     Patient requests refill.  Refill provided.   Simple chronic bronchitis ([place])     Stable on exam today without any complications. Patient requests refill.  Refill provided.   Relevant Medications   albuterol (PROVENTIL HFA;VENTOLIN HFA) 108 (90 Base) MCG/ACT inhaler   Gastroesophageal reflux disease, esophagitis presence not specified     Patient requests refill.  Refill provided.   Relevant Medications   esomeprazole (NEXIUM) 40 MG capsule   Encounter for screening mammogram for breast cancer       Relevant Orders   MM DIGITAL SCREENING BILATERAL      Meds ordered this encounter  Medications  . albuterol (PROVENTIL HFA;VENTOLIN HFA) 108 (90 Base) MCG/ACT inhaler    Sig: Inhale 2 puffs into the lungs every 4 (four) hours as needed for wheezing or shortness of breath.    Dispense:  3 Inhaler    Refill:  1    Order Specific Question:   Supervising Provider    Answer:   Olin Hauser [[id number]]  . busPIRone (BUSPAR) 10 MG tablet    Sig: Take 1 tablet (10 mg total) by mouth 3 (three) times daily as needed.    Dispense:  270 tablet    Refill:  1    Order Specific Question:   Supervising Provider    Answer:    Olin Hauser [[id number]]  . DULoxetine (CYMBALTA) 60 MG capsule    Sig: Take 1 capsule (60 mg total) by mouth daily.    Dispense:  90 capsule    Refill:  1    Order Specific Question:   Supervising Provider    Answer:   Olin Hauser [[id number]]  . EPINEPHrine (EPIPEN 2-PAK) 0.3 mg/0.3 mL IJ SOAJ injection    Sig: Inject 0.3 mLs (0.3 mg total) into the muscle once for 1 dose.    Dispense:  1 Device    Refill:  12    Order Specific Question:   Supervising Provider    Answer:   Olin Hauser [[id number]]  . esomeprazole (NEXIUM) 40 MG capsule    Sig: Take 1 capsule (40 mg total) by mouth daily.    Dispense:  90 capsule    Refill:  4    Order Specific Question:   Supervising Provider    Answer:   Olin Hauser [[id number]]  . azelastine (ASTELIN) 0.1 % nasal spray    Sig: Place 2 sprays into both nostrils 2 (two) times daily as needed for rhinitis. Use in each nostril as directed    Dispense:  30 mL    Refill:  5  Order Specific Question:   Supervising Provider    Answer:   Olin Hauser [[id number]]  . tiZANidine (ZANAFLEX) 4 MG tablet    Sig: TAKE ONE TABLET FOUR TIMES DAILY AS NEEDED FOR MUSCLE SPASM    Dispense:  360 tablet    Refill:  1    Order Specific Question:   Supervising Provider    Answer:   Olin Hauser [[id number]]  . ibuprofen (ADVIL,MOTRIN) 800 MG tablet    Sig: Take 1 tablet (800 mg total) by mouth 3 (three) times daily.    Dispense:  270 tablet    Refill:  3    Order Specific Question:   Supervising Provider    Answer:   Olin Hauser [[id number]]    ________________________________________________________ PROCEDURE NOTE Date: 05/01/2018 Trigger point injections Discussed benefits and risks (including pain, bleeding, infection, steroid flare). Verbal consent given by patient.  The trigger points at Left subscapularis, Right levator scapulae, and LEFT lateral hip were identified and confirmed by the patient. The  spots were marked. Skin was cleaned with alcohol. 2 cc of 1% lidocaine was injected in a hub and spoke fashion in the area. Procedure repeated for each trigger point. cold spray used for superficial anesthetic.Patient tolerated procedure well without bleeding or paresthesias.No complications.   Follow up plan: Return in about 6 weeks (around 06/13/2018) for trigger point injection.  Cassell Smiles, DNP, AGPCNP-BC Adult Gerontology Primary Care Nurse Practitioner [facility] 05/02/2018, 8:28 AM

## 2018-05-02 NOTE — Patient Instructions (Addendum)
[facility],   Thank you for coming in to clinic today.  1. We have done your three trigger point injections today with lidocaine / 1% epi - without steroid - Lidocaine numbing medicine may ease the pain initially for a few hours until it wears off - the goal as you know is the relax the muscle tension areas - We will determine how successful these initial injections are and adjust our strategy in future - Try to take it easy for next day, avoid over activity and strain on joint (limit lifting for shoulder) - Recommend the following: - For pain in future may use heating pad or moist heat as needed  2. Continue medications for depression without change.  3. Your mammogram order has been placed.  Call the Scheduling phone number at [phone] to schedule your mammogram at your convenience.  You can choose to go to either location listed below.  Let the scheduler know which location you prefer.  [place]  [place], [place] [address]   [facility] [address] [place], [place] [address]  Please schedule a follow-up appointment with Cassell Smiles, AGNP. Return in about 6 weeks (around 06/13/2018) for trigger point injection.  If you have any other questions or concerns, please feel free to call the clinic or send a message through [place]. You may also schedule an earlier appointment if necessary.  You will receive a survey after today's visit either digitally by e-mail or paper by [employer]. Your experiences and feedback matter to [place].  Please respond so we know how we are doing as we provide care for you.   Cassell Smiles, DNP, AGNP-BC Adult Gerontology Nurse Practitioner [place]

## 2018-05-09 ENCOUNTER — Encounter: Payer: Self-pay | Admitting: Nurse Practitioner

## 2018-05-21 ENCOUNTER — Ambulatory Visit (INDEPENDENT_AMBULATORY_CARE_PROVIDER_SITE_OTHER): Payer: PPO

## 2018-05-21 VITALS — BP 152/78 | HR 94 | Temp 97.9°F | Resp 18 | Ht 64.0 in | Wt 173.4 lb

## 2018-05-21 DIAGNOSIS — Z0000 Encounter for general adult medical examination without abnormal findings: Secondary | ICD-10-CM

## 2018-05-21 NOTE — Progress Notes (Signed)
Subjective:   April Price is a 63 y.o. female who presents for Medicare Annual (Subsequent) preventive examination.  Review of Systems:  Cardiac Risk Factors include: advanced age (>81men, >28 women);hypertension;dyslipidemia      Objective:     Vitals: BP (!) 152/78 (BP Location: Left Arm, Patient Position: Sitting, Cuff Size: Normal)   Pulse 94   Temp 97.9 F (36.6 C) (Oral)   Resp 18   Ht 5\' 4"  (1.626 m)   Wt 173 lb 6.4 oz (78.7 kg)   BMI 29.76 kg/m   Body mass index is 29.76 kg/m.  Advanced Directives 05/21/2018 02/13/2017 12/06/2016 07/25/2016 06/15/2016 06/05/2016 06/01/2016  Does Patient Have a Medical Advance Directive? Yes Yes No No No No Yes  Type of Advance Directive Living will;Healthcare Power of [place];Living will - - - (No Data) [place];Living will  Does patient want to make changes to medical advance directive? - - - - - - No - Patient declined  Copy of [place] in Chart? No - copy requested No - copy requested - - - - -  Would patient like information on creating a medical advance directive? - - - - - No - Patient declined -    Tobacco Social History   Tobacco Use  Smoking Status Current Every Day Smoker  . Packs/day: 0.25  . Years: 40.00  . Pack years: 10.00  . 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 Allergy   .  Anxiety   . Arthritis   . Asthma   . Cataract    surgery both eyes in 2011  . Chronic fatigue   . Depression   . Depression screen   . Diffuse cystic mastopathy   . Fibromyalgia   . Fibromyalgia muscle pain   . GERD (gastroesophageal reflux disease)   . Heart murmur   . History of kidney stones   . Hypertension    driven by stress and pain   Past Surgical History:  Procedure Laterality Date  . ABDOMINAL HYSTERECTOMY    . BACK SURGERY     Spinal Fusion  . BREAST BIOPSY Right   . cataract    . CHOLECYSTECTOMY    . COLONOSCOPY  2006  . EYE SURGERY Bilateral 2011   cataract  . FRACTURE SURGERY  2018   left wrist  . NASAL SINUS SURGERY    . NASAL SINUS SURGERY    . OPEN REDUCTION INTERNAL FIXATION (ORIF) DISTAL RADIAL FRACTURE Left 06/05/2016   Procedure: OPEN REDUCTION INTERNAL FIXATION (ORIF) DISTAL RADIAL FRACTURE;  Surgeon: Earnestine Leys, MD;  Location: [facility] ORS;  Service: Orthopedics;  Laterality: Left;  Hand Innovations needed  . [place]  2003, 2012  . TYMPANOSTOMY TUBE PLACEMENT     Family History  Problem Relation Age of Onset  . Heart disease Mother   . Arthritis Mother   .  Vision loss Mother   . Heart disease Father   . Stroke Father   . Vascular Disease Sister   . Heart disease Brother    Social History   Socioeconomic History  . Marital status: Single    Spouse name: Not on file  . Number of children: Not on file  . Years of education: Not on file  . Highest education level: Some college, no degree  Occupational History  . Not on file  Social Needs  . Financial resource strain: Somewhat hard  . Food insecurity:    Worry: Never true    Inability: Never true  . Transportation needs:    Medical: No    Non-medical: No  Tobacco Use  . Smoking status: Current Every Day Smoker    Packs/day: 0.25    Years: 40.00    Pack years: 10.00    Types: Cigarettes  . Smokeless tobacco: Never Used  Substance and Sexual Activity  . Alcohol use: No  .  Drug use: No  . Sexual activity: Not Currently    Birth control/protection: None  Lifestyle  . Physical activity:    Days per week: 4 days    Minutes per session: 10 min  . Stress: Not at all  Relationships  . Social connections:    Talks on phone: More than three times a week    Gets together: Once a week    Attends religious service: More than 4 times per year    Active member of club or organization: Yes    Attends meetings of clubs or organizations: 1 to 4 times per year    Relationship status: Never married  Other Topics Concern  . Not on file  Social History Narrative  . Not on file    Outpatient Encounter Medications as of 05/21/2018  Medication Sig  . azelastine (ASTELIN) 0.1 % nasal spray Place 2 sprays into both nostrils 2 (two) times daily as needed for rhinitis. Use in each nostril as directed  . busPIRone (BUSPAR) 10 MG tablet Take 1 tablet (10 mg total) by mouth 3 (three) times daily as needed.  . cholecalciferol (VITAMIN D) 1000 units tablet Take 1,000 Units by mouth daily.  . DULoxetine (CYMBALTA) 60 MG capsule Take 1 capsule (60 mg total) by mouth daily.  Marland Kitchen esomeprazole (NEXIUM) 40 MG capsule Take 1 capsule (40 mg total) by mouth daily.  Marland Kitchen ibuprofen (ADVIL,MOTRIN) 800 MG tablet Take 1 tablet (800 mg total) by mouth 3 (three) times daily.  . Magnesium 300 MG CAPS Take by mouth.  . Multiple Vitamins-Minerals (PRESERVISION AREDS 2 PO) Take by mouth.  Marland Kitchen tiZANidine (ZANAFLEX) 4 MG tablet TAKE ONE TABLET FOUR TIMES DAILY AS NEEDED FOR MUSCLE SPASM  . albuterol (PROVENTIL HFA;VENTOLIN HFA) 108 (90 Base) MCG/ACT inhaler Inhale 2 puffs into the lungs every 4 (four) hours as needed for wheezing or shortness of breath. (Patient not taking: Reported on 05/21/2018)  . EPINEPHrine (EPIPEN 2-PAK) 0.3 mg/0.3 mL IJ SOAJ injection Inject 0.3 mLs (0.3 mg total) into the muscle once for 1 dose. (Patient not taking: Reported on 05/21/2018)  . fluticasone (FLONASE) 50 MCG/ACT nasal spray  Place 2 sprays into both nostrils daily. (Patient not taking: Reported on 05/21/2018)   No facility-administered encounter medications on file as of 05/21/2018.     Activities of Daily Living In your present state of health, do you have any difficulty performing the following activities: 05/21/2018 03/18/2018  Hearing? N N  Vision? Y N  Comment  macular degeneration  -  Difficulty concentrating or making decisions? N N  Walking or climbing stairs? Y N  Comment pain in knee -  Dressing or bathing? N N  Doing errands, shopping? N N  Preparing Food and eating ? N -  Using the Toilet? N -  In the past six months, have you accidently leaked urine? N -  Do you have problems with loss of bowel control? N -  Managing your Medications? N -  Managing your Finances? N -  Housekeeping or managing your Housekeeping? N -  Some recent data might be hidden    Patient Care Team: Mikey College, NP as PCP - General (Nurse Practitioner) Clydell Hakim, MD as Consulting Physician (Anesthesiology)    Assessment:   This is a routine wellness examination for April Price. Exercise Activities and Dietary recommendations Current Exercise Habits: Structured exercise class, Time (Minutes): 45, Frequency (Times/Week): 2, Weekly Exercise (Minutes/Week): 90, Intensity: Mild, Exercise limited by: None identified  Goals    . Increase water intake     Recommend drinking at least 6-7 glasses of water a day     . Quit smoking / using tobacco     Smoking cessation discussed        Fall Risk Fall Risk  05/21/2018 03/18/2018 02/11/2018 02/13/2017 06/15/2016  Falls in the past year? 0 0 0 Yes Yes  Number falls in past yr: 0 - - 2 or more 1  Injury with Fall? 0 - - Yes Yes  Comment - - - - patient fell after losing her balance and fx'd left wrist which required ORIF by Dr Earnestine Leys approx 1 month ago.   Risk Factor Category  - - - High Fall Risk High Fall Risk  Risk for fall due to : - - - History of fall(s) -    Follow up - Falls evaluation completed - Falls prevention discussed -   FALL RISK PREVENTION PERTAINING TO THE HOME:  Any stairs in or around the home WITH handrails? Yes  all stairs have handrails  Home free of loose throw rugs in walkways, pet beds, electrical cords, etc? Yes  Adequate lighting in your home to reduce risk of falls? Yes   ASSISTIVE DEVICES UTILIZED TO PREVENT FALLS:  Life alert? No  Use of a cane, walker or w/c? Yes  Grab bars in the bathroom? Yes  Shower chair or bench in shower? Yes  Elevated toilet seat or a handicapped toilet? Yes   DME ORDERS:  DME order needed?  No   TIMED UP AND GO:  Was the test performed? No .  Length of time to ambulate 10 feet: 10 sec.   GAIT:  Appearance of gait: Gait stead-fast without the use of an assistive device.  Education: Fall risk prevention has been discussed.  Intervention(s) required? No    Depression Screen PHQ 2/9 Scores 05/21/2018 05/02/2018 03/18/2018 02/11/2018  PHQ - 2 Score 1 0 0 0  PHQ- 9 Score - 5 - 0     Cognitive Function     6CIT Screen 05/21/2018 02/13/2017  What Year? 0 points 0 points  What month? 0 points 0 points  What time? 0 points 0 points  Count back from 20 0 points 0 points  Months in reverse 0 points 0 points  Repeat phrase 0 points 0 points  Total Score 0 0    Immunization History  Administered Date(s) Administered  . Influenza Split 02/16/2011, 02/06/2012  . Influenza Whole 04/10/2009  .  Influenza,inj,Quad PF,6+ Mos 03/26/2017, 01/30/2018  . Pneumococcal Polysaccharide-23 04/10/2008    Qualifies for Shingles Vaccine? Yes   Due for Shingrix. Education has been provided regarding the importance of this vaccine. Pt has been advised to call insurance company to determine out of pocket expense. Advised may also receive vaccine at local pharmacy or Health Dept. Verbalized acceptance and understanding.  Tdap: Although this vaccine is not a covered service during a Wellness Exam, does  the patient still wish to receive this vaccine today?  No .  Education has been provided regarding the importance of this vaccine. Advised may receive this vaccine at local pharmacy or Health Dept. Aware to provide a copy of the vaccination record if obtained from local pharmacy or Health Dept. Verbalized acceptance and understanding.  Flu Vaccine: up to date   Pneumococcal Vaccine: up to date, due again at 33   Screening Tests Health Maintenance  Topic Date Due  . Fecal DNA (Cologuard)  01/21/2006  . PAP SMEAR-Modifier  02/06/2019 (Originally 09/09/2007)  . MAMMOGRAM  04/13/2019  . INFLUENZA VACCINE  Completed  . Hepatitis C Screening  Completed  . HIV Screening  Completed  . TETANUS/TDAP  Discontinued   Cancer Screenings:  Colorectal Screening: cologuard completed  Mammogram: Completed 04/12/2017. Repeat every year, scheduled 06/03/2018  Bone Density: Completed 04/12/2017.  Lung Cancer Screening: (Low Dose CT Chest recommended if Age 40-80 years, 30 pack-year currently smoking OR have quit w/in 15years.) does not qualify.     Additional Screening:  Hepatitis C Screening: does qualify; Completed 02/09/1984  Vision Screening: Recommended annual ophthalmology exams for early detection of glaucoma and other disorders of the eye. Is the patient up to date with their annual eye exam?  Yes  Who is the provider or what is the name of the office in which the pt attends annual eye exams? [facility]    Dental Screening: Recommended annual dental exams for proper oral hygiene  Community Resource Referral:  CRR required this visit?  No      Plan:    I have personally reviewed and addressed the Medicare Annual Wellness questionnaire and have noted the following in the patient's chart:  A. Medical and social history B. Use of alcohol, tobacco or illicit drugs  C. Current medications and supplements D. Functional ability and status E.  Nutritional status F.  Physical  activity G. Advance directives H. List of other physicians I.  Hospitalizations, surgeries, and ER visits in previous 12 months J.  [place] such as hearing and vision if needed, cognitive and depression L. Referrals and appointments   In addition, I have reviewed and discussed with patient certain preventive protocols, quality metrics, and best practice recommendations. A written personalized care plan for preventive services as well as general preventive health recommendations were provided to patient.   Signed,  Tyler Aas, LPN Nurse Health Advisor   Nurse Notes:none

## 2018-05-21 NOTE — Patient Instructions (Signed)
April Price , Thank you for taking time to come for your Medicare Wellness Visit. I appreciate your ongoing commitment to your health goals. Please review the following plan we discussed and let me know if I can assist you in the future.   Screening recommendations/referrals: Colonoscopy: cologuard done, please bring a copy for our records Mammogram: completed 04/12/2017 Bone Density: completed 04/12/2017 Recommended yearly ophthalmology/optometry visit for glaucoma screening and checkup Recommended yearly dental visit for hygiene and checkup  Vaccinations: Influenza vaccine: up to date Pneumococcal vaccine: due at age 62 Tdap vaccine: n/a Shingles vaccine: shingrix eligible, check with your insurance company for coverage     Advanced directives: Please bring a copy of your health care power of attorney and living will to the office at your convenience.  Conditions/risks identified: smoking cessation discussed If you wish to quit smoking, help is available. For free tobacco cessation program offerings call the [facility] at [phone] or Live Well Line at (910)644-8907. You may also visit [email or web address] or email [email or web address] for more information on other programs.   Next appointment: Follow up on 06/13/2018 at 8:20am with Lissa Merlin. Follow up in one year for your annual wellness exam.  Preventive Care 40-64 Years, Female Preventive care refers to lifestyle choices and visits with your health care provider that can promote health and wellness. What does preventive care include?  A yearly physical exam. This is also called an annual well check.  Dental exams once or twice a year.  Routine eye exams. Ask your health care provider how often you should have your eyes checked.  Personal lifestyle choices, including:  Daily care of your teeth and gums.  Regular physical activity.  Eating a healthy diet.  Avoiding tobacco and drug  use.  Limiting alcohol use.  Practicing safe sex.  Taking low-dose aspirin daily starting at age 39.  Taking vitamin and mineral supplements as recommended by your health care provider. What happens during an annual well check? The services and screenings done by your health care provider during your annual well check will depend on your age, overall health, lifestyle risk factors, and family history of disease. Counseling  Your health care provider may ask you questions about your:  Alcohol use.  Tobacco use.  Drug use.  Emotional well-being.  Home and relationship well-being.  Sexual activity.  Eating habits.  Work and work Statistician.  Method of birth control.  Menstrual cycle.  Pregnancy history. Screening  You may have the following tests or measurements:  Height, weight, and BMI.  Blood pressure.  Lipid and cholesterol levels. These may be checked every 5 years, or more frequently if you are over 64 years old.  Skin check.  Lung cancer screening. You may have this screening every year starting at age 22 if you have a 30-pack-year history of smoking and currently smoke or have quit within the past 15 years.  Fecal occult blood test (FOBT) of the stool. You may have this test every year starting at age 32.  Flexible sigmoidoscopy or colonoscopy. You may have a sigmoidoscopy every 5 years or a colonoscopy every 10 years starting at age 5.  Hepatitis C blood test.  Hepatitis B blood test.  Sexually transmitted disease (STD) testing.  Diabetes screening. This is done by checking your blood sugar (glucose) after you have not eaten for a while (fasting). You may have this done every 1-3 years.  Mammogram. This may be done every 1-2 years. Talk to your  health care provider about when you should start having regular mammograms. This may depend on whether you have a family history of breast cancer.  BRCA-related cancer screening. This may be done if you  have a family history of breast, ovarian, tubal, or peritoneal cancers.  Pelvic exam and Pap test. This may be done every 3 years starting at age 66. Starting at age 41, this may be done every 5 years if you have a Pap test in combination with an HPV test.  Bone density scan. This is done to screen for osteoporosis. You may have this scan if you are at high risk for osteoporosis. Discuss your test results, treatment options, and if necessary, the need for more tests with your health care provider. Vaccines  Your health care provider may recommend certain vaccines, such as:  Influenza vaccine. This is recommended every year.  Tetanus, diphtheria, and acellular pertussis (Tdap, Td) vaccine. You may need a Td booster every 10 years.  Zoster vaccine. You may need this after age 1.  Pneumococcal 13-valent conjugate (PCV13) vaccine. You may need this if you have certain conditions and were not previously vaccinated.  Pneumococcal polysaccharide (PPSV23) vaccine. You may need one or two doses if you smoke cigarettes or if you have certain conditions. Talk to your health care provider about which screenings and vaccines you need and how often you need them. This information is not intended to replace advice given to you by your health care provider. Make sure you discuss any questions you have with your health care provider. Document Released: 04/23/2015 Document Revised: 12/15/2015 Document Reviewed: 01/26/2015 Elsevier Interactive Patient Education  2017 [place] Prevention in the Home Falls can cause injuries. They can happen to people of all ages. There are many things you can do to make your home safe and to help prevent falls. What can I do on the outside of my home?  Regularly fix the edges of walkways and driveways and fix any cracks.  Remove anything that might make you trip as you walk through a door, such as a raised step or threshold.  Trim any bushes or trees on  the path to your home.  Use bright outdoor lighting.  Clear any walking paths of anything that might make someone trip, such as rocks or tools.  Regularly check to see if handrails are loose or broken. Make sure that both sides of any steps have handrails.  Any raised decks and porches should have guardrails on the edges.  Have any leaves, snow, or ice cleared regularly.  Use sand or salt on walking paths during winter.  Clean up any spills in your garage right away. This includes oil or grease spills. What can I do in the bathroom?  Use night lights.  Install grab bars by the toilet and in the tub and shower. Do not use towel bars as grab bars.  Use non-skid mats or decals in the tub or shower.  If you need to sit down in the shower, use a plastic, non-slip stool.  Keep the floor dry. Clean up any water that spills on the floor as soon as it happens.  Remove soap buildup in the tub or shower regularly.  Attach bath mats securely with double-sided non-slip rug tape.  Do not have throw rugs and other things on the floor that can make you trip. What can I do in the bedroom?  Use night lights.  Make sure that you have a  light by your bed that is easy to reach.  Do not use any sheets or blankets that are too big for your bed. They should not hang down onto the floor.  Have a firm chair that has side arms. You can use this for support while you get dressed.  Do not have throw rugs and other things on the floor that can make you trip. What can I do in the kitchen?  Clean up any spills right away.  Avoid walking on wet floors.  Keep items that you use a lot in easy-to-reach places.  If you need to reach something above you, use a strong step stool that has a grab bar.  Keep electrical cords out of the way.  Do not use floor polish or wax that makes floors slippery. If you must use wax, use non-skid floor wax.  Do not have throw rugs and other things on the floor that  can make you trip. What can I do with my stairs?  Do not leave any items on the stairs.  Make sure that there are handrails on both sides of the stairs and use them. Fix handrails that are broken or loose. Make sure that handrails are as long as the stairways.  Check any carpeting to make sure that it is firmly attached to the stairs. Fix any carpet that is loose or worn.  Avoid having throw rugs at the top or bottom of the stairs. If you do have throw rugs, attach them to the floor with carpet tape.  Make sure that you have a light switch at the top of the stairs and the bottom of the stairs. If you do not have them, ask someone to add them for you. What else can I do to help prevent falls?  Wear shoes that:  Do not have high heels.  Have rubber bottoms.  Are comfortable and fit you well.  Are closed at the toe. Do not wear sandals.  If you use a stepladder:  Make sure that it is fully opened. Do not climb a closed stepladder.  Make sure that both sides of the stepladder are locked into place.  Ask someone to hold it for you, if possible.  Clearly mark and make sure that you can see:  Any grab bars or handrails.  First and last steps.  Where the edge of each step is.  Use tools that help you move around (mobility aids) if they are needed. These include:  Canes.  Walkers.  Scooters.  Crutches.  Turn on the lights when you go into a dark area. Replace any light bulbs as soon as they burn out.  Set up your furniture so you have a clear path. Avoid moving your furniture around.  If any of your floors are uneven, fix them.  If there are any pets around you, be aware of where they are.  Review your medicines with your doctor. Some medicines can make you feel dizzy. This can increase your chance of falling. Ask your doctor what other things that you can do to help prevent falls. This information is not intended to replace advice given to you by your health care  provider. Make sure you discuss any questions you have with your health care provider. Document Released: 01/21/2009 Document Revised: 09/02/2015 Document Reviewed: 05/01/2014 Elsevier Interactive Patient Education  2017 [employer].

## 2018-06-03 ENCOUNTER — Inpatient Hospital Stay: Admission: RE | Admit: 2018-06-03 | Payer: Self-pay | Source: Ambulatory Visit

## 2018-06-13 ENCOUNTER — Other Ambulatory Visit: Payer: Self-pay

## 2018-06-13 ENCOUNTER — Encounter: Payer: Self-pay | Admitting: Nurse Practitioner

## 2018-06-13 ENCOUNTER — Ambulatory Visit (INDEPENDENT_AMBULATORY_CARE_PROVIDER_SITE_OTHER): Payer: PPO | Admitting: Nurse Practitioner

## 2018-06-13 VITALS — BP 122/57 | HR 82 | Temp 97.9°F | Resp 16 | Ht 64.0 in | Wt 169.8 lb

## 2018-06-13 DIAGNOSIS — M25559 Pain in unspecified hip: Secondary | ICD-10-CM | POA: Diagnosis not present

## 2018-06-13 DIAGNOSIS — M25512 Pain in left shoulder: Secondary | ICD-10-CM | POA: Diagnosis not present

## 2018-06-13 DIAGNOSIS — M25511 Pain in right shoulder: Secondary | ICD-10-CM | POA: Diagnosis not present

## 2018-06-13 DIAGNOSIS — M79609 Pain in unspecified limb: Secondary | ICD-10-CM | POA: Diagnosis not present

## 2018-06-13 DIAGNOSIS — G8929 Other chronic pain: Secondary | ICD-10-CM

## 2018-06-13 MED ORDER — LIDOCAINE HCL (PF) 1 % IJ SOLN
6.0000 mL | Freq: Once | INTRAMUSCULAR | Status: AC
  Administered 2018-06-13: 6 mL

## 2018-06-13 NOTE — Progress Notes (Signed)
Subjective:    Patient ID: April Price, female    DOB: December 05, 1955, 63 y.o.   MRN: [id number]  April Price is a 63 y.o. female presenting on 06/13/2018 for Pain (trigger point injection)   HPI Pain management (trigger points)  Recent history of tirgger point injections 06/13/2018: Left subscapularis, Right levator scapulae, and LEFT lateral hip  05/02/2018: Left subscapularis, Right levator scapulae, and LEFT lateral hip - DURATION of relief with > 75% reduction of pain x 5 weeks 03/18/2018: Left subscapularis, Right levator scapulae, Right paraspinal lower cervical - DURATION of relief with > 75% reduction of pain x 4 weeks  02/11/2018: Left subscapularis, Right levator scapulae, and LEFT lateral hip - DURATION of relief with > 75% reduction of pain x 4 weeks and no return of pain by 03/18/2018 for left hip  Pt last had trigger point injections on 05/02/2018. Injections were of  Left subscapularis, Right levator scapulae, and LEFT lateral hip.  These are the same trigger points patient would like to have injected again. Since that time, patient has had a good 5 weeks of relief.  This past week she has had increasing pain, but has been tolerable. Patient notes increased left leg spasms when she has this increased pain.  - Since starting regular trigger point injections, patient is able to be much more active.   She has also had significantly reduced depression over the most recent winter months, which are usually her most difficult months of the year for pain and depression.  Patient is very satisfied with current level of pain control.  Social History   Tobacco Use  . Smoking status: Current Every Day Smoker    Packs/day: 0.25    Years: 40.00    Pack years: 10.00    Types: Cigarettes  . Smokeless tobacco: Never Used  Substance Use Topics  . Alcohol use: No  . Drug use: No    Review of Systems Per HPI unless specifically indicated above     Objective:    BP (!)  122/57   Pulse 82   Temp 97.9 F (36.6 C) (Oral)   Resp 16   Ht 5\' 4"  (1.626 m)   Wt 169 lb 12.8 oz (77 kg)   SpO2 96%   BMI 29.15 kg/m   Wt Readings from Last 3 Encounters:  06/13/18 169 lb 12.8 oz (77 kg)  05/21/18 173 lb 6.4 oz (78.7 kg)  05/02/18 174 lb 12.8 oz (79.3 kg)    Physical Exam Vitals signs reviewed.  Constitutional:      General: She is not in acute distress.    Appearance: She is well-developed.  HENT:     Head: Normocephalic and atraumatic.  Cardiovascular:     Rate and Rhythm: Normal rate and regular rhythm.     Pulses:          Radial pulses are 2+ on the right side and 2+ on the left side.       Posterior tibial pulses are 1+ on the right side and 1+ on the left side.     Heart sounds: Normal heart sounds, S1 normal and S2 normal.  Pulmonary:     Effort: Pulmonary effort is normal. No respiratory distress.     Breath sounds: Normal breath sounds and air entry.  Musculoskeletal:     Right lower leg: No edema.     Left lower leg: No edema.     Comments: Well defined tender trigger points located at:  1- Left subscap 2- Right levator scapulae 3- LEFT lateral hip  Skin:    General: Skin is warm and dry.     Capillary Refill: Capillary refill takes less than 2 seconds.  Neurological:     General: No focal deficit present.     Mental Status: She is alert and oriented to person, place, and time. Mental status is at baseline.  Psychiatric:        Attention and Perception: Attention normal.        Mood and Affect: Mood and affect normal.        Behavior: Behavior normal. Behavior is cooperative.        Thought Content: Thought content normal.        Judgment: Judgment normal.    Results for orders placed or performed in visit on 08/01/17  COMPLETE METABOLIC PANEL WITH GFR  Result Value Ref Range   Glucose, Bld 97 65 - 99 mg/dL   BUN 17 7 - 25 mg/dL   Creat 1.07 (H) 0.50 - 0.99 mg/dL   GFR, Est Non African American 56 (L) > OR = 60 mL/min/1.46m2    GFR, Est African American 65 > OR = 60 mL/min/1.29m2   BUN/Creatinine Ratio 16 6 - 22 (calc)   Sodium 136 135 - 146 mmol/L   Potassium 4.4 3.5 - 5.3 mmol/L   Chloride 106 98 - 110 mmol/L   CO2 23 20 - 32 mmol/L   Calcium 9.7 8.6 - 10.4 mg/dL   Total Protein 6.9 6.1 - 8.1 g/dL   Albumin 4.0 3.6 - 5.1 g/dL   Globulin 2.9 1.9 - 3.7 g/dL (calc)   AG Ratio 1.4 1.0 - 2.5 (calc)   Total Bilirubin 0.5 0.2 - 1.2 mg/dL   Alkaline phosphatase (APISO) 99 33 - 130 U/L   AST 13 10 - 35 U/L   ALT 10 6 - 29 U/L  Magnesium  Result Value Ref Range   Magnesium 1.9 1.5 - 2.5 mg/dL  Phosphorus  Result Value Ref Range   Phosphorus 4.2 2.5 - 4.5 mg/dL      Assessment & Plan:   Problem List Items Addressed This Visit      Other   Chronic shoulder pain (Location of Primary Source of Pain) (Bilateral) (L>R) - Primary (Chronic)   Relevant Medications   lidocaine (PF) (XYLOCAINE) 1 % injection 6 mL (Completed) (Start on 06/13/2018  9:15 AM)   Chronic hip pain (Location of Secondary source of pain) (Bilateral) (L>R) (Chronic)   Relevant Medications   lidocaine (PF) (XYLOCAINE) 1 % injection 6 mL (Completed) (Start on 06/13/2018  9:15 AM)    Other Visit Diagnoses    Trigger point of left shoulder region       Relevant Medications   lidocaine (PF) (XYLOCAINE) 1 % injection 6 mL (Completed) (Start on 06/13/2018  9:15 AM)   Trigger point of right shoulder region       Relevant Medications   lidocaine (PF) (XYLOCAINE) 1 % injection 6 mL (Completed) (Start on 06/13/2018  9:15 AM)   Trigger point of extremity       Relevant Medications   lidocaine (PF) (XYLOCAINE) 1 % injection 6 mL (Completed) (Start on 06/13/2018  9:15 AM)     Patient with chronic pain and moderate relief with trigger point injections.  This is helping better control depression and blood pressure this winter. Now depression improving with increased daylight, warmer weather.  Pain always worst during cold months and patient expresses satisfaction  with current pain relief regimen.  Repeat trigger point injections today 6 weeks after last injections performed by myself. - See procedure note below for three trigger point injections.  Used 2 mL lidocaine 1% in each trigger point. - Follow-up 5 weeks for additional injections prn.  PROCEDURE NOTE Date: 06/13/2018 Trigger point injections Discussed benefits and risks (including pain, bleeding, infection, steroid flare). Verbal consent given by patient.  Assisted by Donnie Mesa, CMA.  Observer Mia, CMA student  The trigger points at Left subscapularis, Right levator scapulae, and LEFT lateral hip were identified and confirmed by the patient. The spots were marked. Skin was cleaned with alcohol. 2 cc of 1% lidocaine was injected in a hub and spoke fashion in the area. Procedure repeated for each trigger point. cold spray used for superficial anesthetic.Patient tolerated procedure well without bleeding or paresthesias.No complications.  Patient was able to verbalize when specific trigger point was injected with lidocaine and successful injection was obtained in all 3 locations per patient feedback.  Meds ordered this encounter  Medications  . lidocaine (PF) (XYLOCAINE) 1 % injection 6 mL    Follow up plan: Return in about 5 weeks (around 07/18/2018) for trigger point injections.  Cassell Smiles, DNP, AGPCNP-BC Adult Gerontology Primary Care Nurse Practitioner [facility] 06/13/2018, 8:39 AM

## 2018-06-13 NOTE — Patient Instructions (Addendum)
[place],   Thank you for coming in to clinic today.  1. You received 3 trigger point injections today.   - Rest muscles today  2. No changes to medications.  Please schedule a follow-up appointment with Cassell Smiles, AGNP. Return in about 5 weeks (around 07/18/2018) for trigger point injections.  If you have any other questions or concerns, please feel free to call the clinic or send a message through [place]. You may also schedule an earlier appointment if necessary.  You will receive a survey after today's visit either digitally by e-mail or paper by [employer]. Your experiences and feedback matter to [place].  Please respond so we know how we are doing as we provide care for you.   Cassell Smiles, DNP, AGNP-BC Adult Gerontology Nurse Practitioner [place]

## 2018-06-19 ENCOUNTER — Encounter: Payer: Self-pay | Admitting: Nurse Practitioner

## 2018-07-16 ENCOUNTER — Ambulatory Visit: Payer: PPO | Admitting: Nurse Practitioner

## 2018-08-29 ENCOUNTER — Encounter: Payer: Self-pay | Admitting: Family Medicine

## 2018-08-29 ENCOUNTER — Other Ambulatory Visit: Payer: Self-pay

## 2018-08-29 ENCOUNTER — Ambulatory Visit (INDEPENDENT_AMBULATORY_CARE_PROVIDER_SITE_OTHER): Payer: PPO | Admitting: Family Medicine

## 2018-08-29 VITALS — BP 152/78 | HR 79 | Temp 98.0°F | Resp 16 | Ht 64.0 in | Wt 175.6 lb

## 2018-08-29 DIAGNOSIS — G894 Chronic pain syndrome: Secondary | ICD-10-CM | POA: Diagnosis not present

## 2018-08-29 DIAGNOSIS — M25559 Pain in unspecified hip: Secondary | ICD-10-CM | POA: Diagnosis not present

## 2018-08-29 DIAGNOSIS — M25511 Pain in right shoulder: Secondary | ICD-10-CM | POA: Diagnosis not present

## 2018-08-29 DIAGNOSIS — M25512 Pain in left shoulder: Secondary | ICD-10-CM | POA: Diagnosis not present

## 2018-08-29 DIAGNOSIS — M79609 Pain in unspecified limb: Secondary | ICD-10-CM | POA: Diagnosis not present

## 2018-08-29 DIAGNOSIS — G8929 Other chronic pain: Secondary | ICD-10-CM

## 2018-08-29 MED ORDER — LIDOCAINE HCL (PF) 1 % IJ SOLN
2.0000 mL | Freq: Once | INTRAMUSCULAR | Status: AC
  Administered 2018-08-29: 2 mL

## 2018-08-29 MED ORDER — LIDOCAINE HCL (PF) 1 % IJ SOLN: 6.0000 mL | Freq: Once | INTRAMUSCULAR | Status: DC

## 2018-08-29 NOTE — Patient Instructions (Addendum)
Thank you for coming to the office today.  You received multiple trigger point muscle injection today - Lidocaine numbing medicine may ease the pain initially for a few hours until it wears off - the goal as you know is the relax the muscle tension areas - Try to take it easy for next day, avoid over activity and strain on joint - Recommend the following:   - For pain in future may use heating pad or moist heat as needed  If you develop unusual redness worse pain, swelling of skin or drainage of pus, fever or chills - there is a risk that due to injection may get superficial skin infection, please return or seek care immediately at hospital or urgent care if this happens.   Please schedule a Follow-up Appointment to: Return in about 4 weeks (around 09/26/2018) for Trigger point injections.  If you have any other questions or concerns, please feel free to call the office or send a message through [place]. You may also schedule an earlier appointment if necessary.  Additionally, you may be receiving a survey about your experience at our office within a few days to 1 week by e-mail or mail. We value your feedback.  Nobie Putnam, DO [place]

## 2018-08-29 NOTE — Progress Notes (Signed)
Subjective:    Patient ID: April Price, female    DOB: [birth year], 63 y.o.   MRN: [id number]  April Price is a 63 y.o. female presenting on 08/29/2018 for Shoulder Pain and Leg Pain (left side)  PCP is Cassell Smiles, AGPCNP-BC - I am currently covering during her maternity leave.   HPI   Chronic Pain Syndrome / Left Hip Pain / Left Scapular Pain / R shoulder pain - Followed by Pain Management [facility] / has had extensive therapy in past including joint steroid injections, trigger point injections, PT. - Recent history has been doing well on trigger point injections with lidocaine only several locations up to 3 points, each visit about every 4-8 weeks with good result.  - Insurance not cover PT for injections and she has followed up with PCP here for them - She goes to pain management once yearly - Last injections 06/13/18 - improvement, then wore off 3-4 weeks after - Today reports due for 3 trigger point muscle injections last time, one in Left Subscapular region, one in Right Levator Scapulae, and one in Left Gluteal Hip lateral aspect. - Pain improved in muscles, and she was able to rest and sleep well initially which was a big improvement. Worse flare up with activities. Cramps in muscles occasionally - Currently taking Cymbalta 60mg  daily, Tizanidine 4mg  as needed - limited results - No recent injury or trauma or worsening pain - Denies any fevers chills, nausea vomiting, skin rash redness, edema, any new numbness tingling weakness   Depression screen [facility] 2/9 08/29/2018 06/13/2018 05/21/2018  Decreased Interest 1 0 0  Down, Depressed, Hopeless 2 1 1   PHQ - 2 Score 3 1 1   Altered sleeping 2 3 -  Tired, decreased energy 1 0 -  Change in appetite 1 0 -  Feeling bad or failure about yourself  0 0 -  Trouble concentrating 0 0 -  Moving slowly or fidgety/restless 0 0 -  Suicidal thoughts 0 0 -  PHQ-9 Score 7 4 -  Difficult doing work/chores Not  difficult at all Not difficult at all -  Some recent data might be hidden    Social History   Tobacco Use  . Smoking status: Current Every Day Smoker    Packs/day: 0.25    Years: 40.00    Pack years: 10.00    Types: Cigarettes  . Smokeless tobacco: Current User  Substance Use Topics  . Alcohol use: No  . Drug use: No    Review of Systems Per HPI unless specifically indicated above     Objective:    BP (!) 152/78   Pulse 79   Temp 98 F (36.7 C) (Oral)   Resp 16   Ht 5\' 4"  (1.626 m)   Wt 175 lb 9.6 oz (79.7 kg)   BMI 30.14 kg/m   Wt Readings from Last 3 Encounters:  08/29/18 175 lb 9.6 oz (79.7 kg)  06/13/18 169 lb 12.8 oz (77 kg)  05/21/18 173 lb 6.4 oz (78.7 kg)    Physical Exam Vitals signs and nursing note reviewed.  Constitutional:      General: She is not in acute distress.    Appearance: She is well-developed. She is not diaphoretic.     Comments: Well-appearing, comfortable, cooperative, overweight  HENT:     Head: Normocephalic and atraumatic.  Eyes:     General:        Right eye: No discharge.  Left eye: No discharge.     Conjunctiva/sclera: Conjunctivae normal.  Cardiovascular:     Rate and Rhythm: Normal rate.  Pulmonary:     Effort: Pulmonary effort is normal.  Musculoskeletal:     Comments: Trigger points: - Left subscapular - mid scapula thoracic region more central, palpable muscle knot trigger point tender with adjacent paraspinal muscle spasm  - R levator scapulae at superior medial aspect of scapula, with muscle hypertonicity and spasm localized trigger point  - L gluteal/lateral hip - deeper palpation with distinct trigger point tenderness, not consistent with trochanteric bursa but likely some component of troch bursitis as well  Skin:    General: Skin is warm and dry.     Findings: No erythema or rash.  Neurological:     Mental Status: She is alert and oriented to person, place, and time.  Psychiatric:        Behavior:  Behavior normal.     Comments: Well groomed, good eye contact, normal speech and thoughts      PROCEDURE NOTE Date: 02/11/18 Trigger point injections, x 3 #1: LEFT Subscapular #2: RIGHT Levator Scapulae #3: LEFT Gluteal / Hip, lateral Discussed benefits and risks (including pain, bleeding, infection, steroid flare). Verbal consent given by patient. Medication:  Total 6 cc Lidocaine 1% without epi - 2 cc per trigger point approx Time Out taken  Landmarks identified individually each spot located with palpable muscle knot and localized tenderness. Area cleansed with alcohol wipes, cold spray used for superficial anesthetic.Using 25 gauge 1.5 inch needle, medicine was injected in a wheel fashion in the area.No bandage applied per request.Patient tolerated procedure well without bleeding or paresthesias.No complications.   Results for orders placed or performed in visit on 08/01/17  COMPLETE METABOLIC PANEL WITH GFR  Result Value Ref Range   Glucose, Bld 97 65 - 99 mg/dL   BUN 17 7 - 25 mg/dL   Creat 1.07 (H) 0.50 - 0.99 mg/dL   GFR, Est Non African American 56 (L) > OR = 60 mL/min/1.55m2   GFR, Est African American 65 > OR = 60 mL/min/1.31m2   BUN/Creatinine Ratio 16 6 - 22 (calc)   Sodium 136 135 - 146 mmol/L   Potassium 4.4 3.5 - 5.3 mmol/L   Chloride 106 98 - 110 mmol/L   CO2 23 20 - 32 mmol/L   Calcium 9.7 8.6 - 10.4 mg/dL   Total Protein 6.9 6.1 - 8.1 g/dL   Albumin 4.0 3.6 - 5.1 g/dL   Globulin 2.9 1.9 - 3.7 g/dL (calc)   AG Ratio 1.4 1.0 - 2.5 (calc)   Total Bilirubin 0.5 0.2 - 1.2 mg/dL   Alkaline phosphatase (APISO) 99 33 - 130 U/L   AST 13 10 - 35 U/L   ALT 10 6 - 29 U/L  Magnesium  Result Value Ref Range   Magnesium 1.9 1.5 - 2.5 mg/dL  Phosphorus  Result Value Ref Range   Phosphorus 4.2 2.5 - 4.5 mg/dL      Assessment & Plan:   Problem List Items Addressed This Visit    Chronic hip pain (Location of Secondary source of pain) (Bilateral) (L>R) (Chronic)    Relevant Medications   lidocaine (PF) (XYLOCAINE) 1 % injection 2 mL (Completed)   lidocaine (PF) (XYLOCAINE) 1 % injection 2 mL (Completed)   lidocaine (PF) (XYLOCAINE) 1 % injection 2 mL (Completed)   Chronic pain syndrome - Primary (Chronic)   Relevant Medications   lidocaine (PF) (XYLOCAINE) 1 % injection  2 mL (Completed)   lidocaine (PF) (XYLOCAINE) 1 % injection 2 mL (Completed)   lidocaine (PF) (XYLOCAINE) 1 % injection 2 mL (Completed)   Chronic shoulder pain (Location of Primary Source of Pain) (Bilateral) (L>R) (Chronic)   Relevant Medications   lidocaine (PF) (XYLOCAINE) 1 % injection 2 mL (Completed)   lidocaine (PF) (XYLOCAINE) 1 % injection 2 mL (Completed)   lidocaine (PF) (XYLOCAINE) 1 % injection 2 mL (Completed)    Other Visit Diagnoses    Trigger point of left shoulder region       Relevant Medications   lidocaine (PF) (XYLOCAINE) 1 % injection 2 mL (Completed)   Trigger point of right shoulder region       Relevant Medications   lidocaine (PF) (XYLOCAINE) 1 % injection 2 mL (Completed)   Trigger point of extremity       Relevant Medications   lidocaine (PF) (XYLOCAINE) 1 % injection 2 mL (Completed)      Clinically with chronic pain syndrome, multiple pain generators w/ fibromyalgia / MSK pain Followed by Pain Management, previously ortho, and current PCP Recent flare of MSK pain and muscle spasm with trigger points various locations, most significant 3 areas L subscapular, R levator scap, R paraspinal lower cervical  Plan - Performed trigger point injections today see procedure note, tolerated well with good results after injections - Continue current management - Recommend return within 4-6 weeks for re-evaluation, repeat injections if progress or new trigger points  Future consider PT referral / other med options  Meds ordered this encounter  Medications  . DISCONTD: lidocaine (PF) (XYLOCAINE) 1 % injection 6 mL  . lidocaine (PF) (XYLOCAINE) 1 %  injection 2 mL  . lidocaine (PF) (XYLOCAINE) 1 % injection 2 mL  . lidocaine (PF) (XYLOCAINE) 1 % injection 2 mL      Follow up plan: Return in about 4 weeks (around 09/26/2018) for Trigger point injections.   Nobie Putnam, DO [facility] 08/29/2018, 8:26 AM

## 2018-09-04 ENCOUNTER — Ambulatory Visit: Payer: PPO | Admitting: Nurse Practitioner

## 2018-10-01 ENCOUNTER — Other Ambulatory Visit: Payer: Self-pay

## 2018-10-01 ENCOUNTER — Encounter: Payer: Self-pay | Admitting: Family Medicine

## 2018-10-01 ENCOUNTER — Ambulatory Visit (INDEPENDENT_AMBULATORY_CARE_PROVIDER_SITE_OTHER): Payer: PPO | Admitting: Family Medicine

## 2018-10-01 VITALS — BP 104/64 | HR 78 | Temp 98.3°F | Resp 16 | Ht 64.0 in | Wt 172.0 lb

## 2018-10-01 DIAGNOSIS — G8929 Other chronic pain: Secondary | ICD-10-CM | POA: Diagnosis not present

## 2018-10-01 DIAGNOSIS — M25559 Pain in unspecified hip: Secondary | ICD-10-CM | POA: Diagnosis not present

## 2018-10-01 DIAGNOSIS — G894 Chronic pain syndrome: Secondary | ICD-10-CM

## 2018-10-01 DIAGNOSIS — M79609 Pain in unspecified limb: Secondary | ICD-10-CM | POA: Diagnosis not present

## 2018-10-01 DIAGNOSIS — M25511 Pain in right shoulder: Secondary | ICD-10-CM | POA: Diagnosis not present

## 2018-10-01 DIAGNOSIS — M25512 Pain in left shoulder: Secondary | ICD-10-CM | POA: Diagnosis not present

## 2018-10-01 MED ORDER — LIDOCAINE HCL (PF) 1 % IJ SOLN: 2.0000 mL | Freq: Once | INTRAMUSCULAR | Status: DC

## 2018-10-01 NOTE — Progress Notes (Signed)
Subjective:    Patient ID: April Price, female    DOB: 1955/10/17, 63 y.o.   MRN: [id number]  April Price is a 63 y.o. female presenting on 10/01/2018 for Shoulder Pain (left side injection) and Hip Pain (left side injection)  PCP is Cassell Smiles, AGPCNP-BC - I am currently covering during her maternity leave.   HPI   Chronic Pain Syndrome / Left Hip Pain / Left Scapular Pain - Followed by Pain Management [facility] / has had extensive therapy in past including joint steroid injections, trigger point injections, PT. - Recent history within last 1 month has been doing well on trigger point injections with lidocaine only several locations up to 3 points, each visit about every 4-8 weeks with good result.  - She follows with PCP for injections due to coverage and access to care - She goes to pain management once yearly - Last injections 08/29/18 - improvement, then wore off 3-4 weeks after - Today reports due for 2 trigger point muscle injections last time, one in Left Subscapular region, one in Left Gluteal Hip lateral aspect. - She notes improvement in Right Levator Scapulae after previous injections, no repeat needed today - Pain improved in muscles with injections, otherwise now worsening with pain and cramps in left leg. Worse flare up with activities. - Currently taking Cymbalta 60mg  daily, Tizanidine 4mg  as needed - limited results - No recent injury or trauma or worsening pain - Denies any fevers chills, nausea vomiting, skin rash redness, edema, any new numbness tingling weakness  Depression screen [facility] 2/9 08/29/2018 06/13/2018 05/21/2018  Decreased Interest 1 0 0  Down, Depressed, Hopeless 2 1 1   PHQ - 2 Score 3 1 1   Altered sleeping 2 3 -  Tired, decreased energy 1 0 -  Change in appetite 1 0 -  Feeling bad or failure about yourself  0 0 -  Trouble concentrating 0 0 -  Moving slowly or fidgety/restless 0 0 -  Suicidal thoughts 0 0 -    PHQ-9 Score 7 4 -  Difficult doing work/chores Not difficult at all Not difficult at all -  Some recent data might be hidden    Social History   Tobacco Use   Smoking status: Current Every Day Smoker    Packs/day: 0.25    Years: 40.00    Pack years: 10.00    Types: Cigarettes   Smokeless tobacco: Current User  Substance Use Topics   Alcohol use: No   Drug use: No    Review of Systems Per HPI unless specifically indicated above     Objective:    BP 104/64    Pulse 78    Temp 98.3 F (36.8 C) (Oral)    Resp 16    Ht 5\' 4"  (1.626 m)    Wt 172 lb (78 kg)    BMI 29.52 kg/m   Wt Readings from Last 3 Encounters:  10/01/18 172 lb (78 kg)  08/29/18 175 lb 9.6 oz (79.7 kg)  06/13/18 169 lb 12.8 oz (77 kg)    Physical Exam Vitals signs and nursing note reviewed.  Constitutional:      General: She is not in acute distress.    Appearance: She is well-developed. She is not diaphoretic.     Comments: Well-appearing, comfortable, cooperative  HENT:     Head: Normocephalic and atraumatic.  Eyes:     General:        Right eye: No discharge.  Left eye: No discharge.     Conjunctiva/sclera: Conjunctivae normal.  Cardiovascular:     Rate and Rhythm: Normal rate.  Pulmonary:     Effort: Pulmonary effort is normal.  Musculoskeletal:     Comments: Trigger points: - Left subscapular - mid scapula thoracic region more central, palpable muscle knot trigger point tender with adjacent paraspinal muscle spasm  - L gluteal/lateral hip - deeper palpation with distinct trigger point tenderness, not consistent with trochanteric bursa but likely some component of troch bursitis as well   Skin:    General: Skin is warm and dry.     Findings: No erythema or rash.  Neurological:     Mental Status: She is alert and oriented to person, place, and time.  Psychiatric:        Behavior: Behavior normal.     Comments: Well groomed, good eye contact, normal speech and thoughts     PROCEDURE NOTE Date: 10/01/18 Trigger pointinjections, x 3 #1: LEFT Subscapular #2: LEFT Gluteal / Hip, lateral Discussed benefits and risks (including pain, bleeding, infection, steroid flare). Verbal consent given by patient. Medication:Total 4cc Lidocaine 1% without epi - 2 cc per trigger point approx Time Out taken  Landmarks identifiedindividually each spotlocated with palpable muscle knot and localized tenderness. Area cleansed with alcohol wipes, cold spray used for superficial anesthetic.Using 25gauge 1.5 inchneedle, medicine was injected in a wheel fashion in the area.No bandage applied per request.Patient tolerated procedure well without bleeding or paresthesias.No complications  Results for orders placed or performed in visit on 08/01/17  COMPLETE METABOLIC PANEL WITH GFR  Result Value Ref Range   Glucose, Bld 97 65 - 99 mg/dL   BUN 17 7 - 25 mg/dL   Creat 1.07 (H) 0.50 - 0.99 mg/dL   GFR, Est Non African American 56 (L) > OR = 60 mL/min/1.61m2   GFR, Est African American 65 > OR = 60 mL/min/1.24m2   BUN/Creatinine Ratio 16 6 - 22 (calc)   Sodium 136 135 - 146 mmol/L   Potassium 4.4 3.5 - 5.3 mmol/L   Chloride 106 98 - 110 mmol/L   CO2 23 20 - 32 mmol/L   Calcium 9.7 8.6 - 10.4 mg/dL   Total Protein 6.9 6.1 - 8.1 g/dL   Albumin 4.0 3.6 - 5.1 g/dL   Globulin 2.9 1.9 - 3.7 g/dL (calc)   AG Ratio 1.4 1.0 - 2.5 (calc)   Total Bilirubin 0.5 0.2 - 1.2 mg/dL   Alkaline phosphatase (APISO) 99 33 - 130 U/L   AST 13 10 - 35 U/L   ALT 10 6 - 29 U/L  Magnesium  Result Value Ref Range   Magnesium 1.9 1.5 - 2.5 mg/dL  Phosphorus  Result Value Ref Range   Phosphorus 4.2 2.5 - 4.5 mg/dL      Assessment & Plan:   Problem List Items Addressed This Visit    Chronic hip pain (Location of Secondary source of pain) (Bilateral) (L>R) (Chronic)   Relevant Medications   lidocaine (PF) (XYLOCAINE) 1 % injection 2 mL (Start on 10/01/2018  8:30 AM)   lidocaine (PF)  (XYLOCAINE) 1 % injection 2 mL (Start on 10/01/2018  8:30 AM)   Chronic pain syndrome - Primary (Chronic)   Relevant Medications   lidocaine (PF) (XYLOCAINE) 1 % injection 2 mL (Start on 10/01/2018  8:30 AM)   lidocaine (PF) (XYLOCAINE) 1 % injection 2 mL (Start on 10/01/2018  8:30 AM)   Chronic shoulder pain (Location of Primary Source of  Pain) (Bilateral) (L>R) (Chronic)   Relevant Medications   lidocaine (PF) (XYLOCAINE) 1 % injection 2 mL (Start on 10/01/2018  8:30 AM)   lidocaine (PF) (XYLOCAINE) 1 % injection 2 mL (Start on 10/01/2018  8:30 AM)    Other Visit Diagnoses    Trigger point of left shoulder region       Relevant Medications   lidocaine (PF) (XYLOCAINE) 1 % injection 2 mL (Start on 10/01/2018  8:30 AM)   Trigger point of extremity       Relevant Medications   lidocaine (PF) (XYLOCAINE) 1 % injection 2 mL (Start on 10/01/2018  8:30 AM)      Meds ordered this encounter  Medications   lidocaine (PF) (XYLOCAINE) 1 % injection 2 mL   lidocaine (PF) (XYLOCAINE) 1 % injection 2 mL   Clinically with chronic pain syndrome, multiple pain generators w/ fibromyalgia / MSK pain Followed by Pain Management, previously ortho, and current PCP Recent flare of MSK pain and muscle spasm with trigger points various locations, most significant 2 areas L subscapular, Left Hip - Previously also has received trigger patient injection R levator scap,  Plan - Performed trigger point injections x2 today see procedure note, tolerated well with good results after injections - Continue current management - Recommend return within 4-6 weeks for re-evaluation, repeat injections if progress or new trigger points  Future consider PT referral / other med options    Follow up plan: Return in about 4 weeks (around 10/29/2018), or if symptoms worsen or fail to improve, for trigger pt injections.   Nobie Putnam, DO [facility] 10/01/2018, 8:14 AM

## 2018-10-01 NOTE — Patient Instructions (Addendum)
Thank you for coming to the office today.  You received multiple trigger point muscle injection today - Lidocaine numbing medicine may ease the pain initially for a few hours until it wears off - the goal as you know is the relax the muscle tension areas - Try to take it easy for next day, avoid over activity and strain on joint - Recommend the following:   - For pain in future may use heating pad or moist heat as needed  If you develop unusual redness worse pain, swelling of skin or drainage of pus, fever or chills - there is a risk that due to injection may get superficial skin infection, please return or seek care immediately at hospital or urgent care if this happens.   Please schedule a Follow-up Appointment to: Return in about 4 weeks (around 10/29/2018), or if symptoms worsen or fail to improve, for trigger pt injections.  If you have any other questions or concerns, please feel free to call the office or send a message through [place]. You may also schedule an earlier appointment if necessary.  Additionally, you may be receiving a survey about your experience at our office within a few days to 1 week by e-mail or mail. We value your feedback.  Nobie Putnam, DO [facility]

## 2018-11-13 ENCOUNTER — Other Ambulatory Visit: Payer: Self-pay

## 2018-11-13 ENCOUNTER — Encounter: Payer: Self-pay | Admitting: Nurse Practitioner

## 2018-11-13 ENCOUNTER — Ambulatory Visit (INDEPENDENT_AMBULATORY_CARE_PROVIDER_SITE_OTHER): Payer: PPO | Admitting: Nurse Practitioner

## 2018-11-13 VITALS — BP 141/71 | Ht 64.0 in | Wt 173.2 lb

## 2018-11-13 DIAGNOSIS — M25512 Pain in left shoulder: Secondary | ICD-10-CM | POA: Diagnosis not present

## 2018-11-13 DIAGNOSIS — G8929 Other chronic pain: Secondary | ICD-10-CM

## 2018-11-13 DIAGNOSIS — G894 Chronic pain syndrome: Secondary | ICD-10-CM

## 2018-11-13 DIAGNOSIS — J302 Other seasonal allergic rhinitis: Secondary | ICD-10-CM

## 2018-11-13 DIAGNOSIS — M25559 Pain in unspecified hip: Secondary | ICD-10-CM

## 2018-11-13 DIAGNOSIS — M25511 Pain in right shoulder: Secondary | ICD-10-CM

## 2018-11-13 DIAGNOSIS — H6982 Other specified disorders of Eustachian tube, left ear: Secondary | ICD-10-CM | POA: Diagnosis not present

## 2018-11-13 DIAGNOSIS — M79609 Pain in unspecified limb: Secondary | ICD-10-CM

## 2018-11-13 MED ORDER — MONTELUKAST SODIUM 10 MG PO TABS: 10.0000 mg | ORAL_TABLET | Freq: Every day | ORAL | 3 refills | Status: DC

## 2018-11-13 MED ORDER — LIDOCAINE HCL (PF) 1 % IJ SOLN
6.0000 mL | Freq: Once | INTRAMUSCULAR | Status: AC
  Administered 2018-11-13: 6 mL

## 2018-11-13 MED ORDER — TIZANIDINE HCL 4 MG PO TABS: ORAL_TABLET | ORAL | 1 refills | Status: DC

## 2018-11-13 NOTE — Progress Notes (Signed)
Subjective:    Patient ID: April Price, female    DOB: [birth year], 63 y.o.   MRN: [id number]  April Price is a 63 y.o. female presenting on 11/13/2018 for Shoulder Pain (bilateral chronic shoulder pain and left hip pain )   HPI Chronic pain syndrome/left hip pain/left scapular pain  Patient continues to be followed by pain management at [facility].  Since of therapy in past of included joint steroid injections, trigger point injections, PT.  Most helpful treatment in the past has been lidocaine only injections.  Most recent visit on 10/01/2018 with Dr. Parks Ranger involved trigger point injections to locations in the left hip and left subscapular areas.  At that visit, left gluteal hip lateral aspect region trigger point was quite large.  Suggested possible 2 injection site at next visit.  Usually, patient has good relief from lidocaine only injections for 4-6 weeks. - Patient had hip pain recur at about 2 weeks after last injection, but was bearable.  Yesterday was very difficult to manage.   - LEFT Subscapular trigger point has had good pain relief for last 4-5 weeks, just starting to get painful again.  - Not having RIGHT posterior levator scapulae pain at this time. 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  She has had surgery x 2 on sinuses by Dr.Juengel in the past. She notes her porch chair had been sitting next to a bird house where a bird built a nest.  New "allergy" to Purell hand sanitizer and activation of sinus passages with coughing / sneezing, runny eyes. Is using just plain alcohol for sanitizer.  Now has intermittent vertigo.  Feels like her ear drum is retracted.   - Patient has tried Human resources officer, Claritin, Zyrtec without relief - Old  prescription of Singulair is helping - Patient continues Flonase and Astelin without full resolution. - Denies any fever, chills, sweats, tooth or jaw pain, ear pain, sore throat, cough.  Social History   Tobacco Use  . Smoking status: Current Every Day Smoker    Packs/day: 0.25    Years: 40.00    Pack years: 10.00    Types: Cigarettes  . Smokeless tobacco: Current User  Substance Use Topics  . Alcohol use: No  . Drug use: No    Review of Systems Per HPI unless specifically indicated above     Objective:    BP (!) 141/71 (BP Location: Right Arm, Patient Position: Sitting, Cuff Size: Normal)   Ht 5\' 4"  (1.626 m)   Wt 173 lb 3.2 oz (78.6 kg)   BMI 29.73 kg/m   Wt Readings from Last 3 Encounters:  11/13/18 173 lb 3.2 oz (78.6 kg)  10/01/18 172 lb (78 kg)  08/29/18 175 lb 9.6 oz (79.7 kg)    Physical Exam Vitals signs reviewed.  Constitutional:      General: She is not in acute distress.    Appearance: She is well-developed.  HENT:     Head: Normocephalic and atraumatic.     Right Ear: Tympanic membrane, ear canal and external ear normal.     Left Ear: Ear canal and external ear normal. A middle ear effusion is present. Tympanic membrane is scarred and retracted. Tympanic membrane is not injected, perforated or erythematous.     Nose: Nose normal.  Mouth/Throat:     Lips: Pink.     Mouth: Mucous membranes are moist.     Pharynx: Oropharynx is clear.     Tonsils: 0 on the right. 0 on the left.  Cardiovascular:     Rate and Rhythm: Normal rate and regular rhythm.     Pulses:          Radial pulses are 2+ on the right side and 2+ on the left side.       Posterior tibial pulses are 1+ on the right side and 1+ on the left side.     Heart sounds: Normal heart sounds, S1 normal and S2 normal.  Pulmonary:     Effort: Pulmonary effort is normal. No respiratory distress.     Breath sounds: Normal breath sounds and air entry.  Musculoskeletal:     Right lower leg: No  edema.     Left lower leg: No edema.     Comments: Well defined tender trigger points located at: 1- Left subscap 2- LEFT lateral hip with two trigger points within same hypertonic muscle  Skin:    General: Skin is warm and dry.     Capillary Refill: Capillary refill takes less than 2 seconds.  Neurological:     General: No focal deficit present.     Mental Status: She is alert and oriented to person, place, and time. Mental status is at baseline.  Psychiatric:        Attention and Perception: Attention normal.        Mood and Affect: Mood and affect normal.        Behavior: Behavior normal. Behavior is cooperative.        Thought Content: Thought content normal.        Judgment: Judgment normal.     Results for orders placed or performed in visit on 08/01/17  COMPLETE METABOLIC PANEL WITH GFR  Result Value Ref Range   Glucose, Bld 97 65 - 99 mg/dL   BUN 17 7 - 25 mg/dL   Creat 1.07 (H) 0.50 - 0.99 mg/dL   GFR, Est Non African American 56 (L) > OR = 60 mL/min/1.98m2   GFR, Est African American 65 > OR = 60 mL/min/1.79m2   BUN/Creatinine Ratio 16 6 - 22 (calc)   Sodium 136 135 - 146 mmol/L   Potassium 4.4 3.5 - 5.3 mmol/L   Chloride 106 98 - 110 mmol/L   CO2 23 20 - 32 mmol/L   Calcium 9.7 8.6 - 10.4 mg/dL   Total Protein 6.9 6.1 - 8.1 g/dL   Albumin 4.0 3.6 - 5.1 g/dL   Globulin 2.9 1.9 - 3.7 g/dL (calc)   AG Ratio 1.4 1.0 - 2.5 (calc)   Total Bilirubin 0.5 0.2 - 1.2 mg/dL   Alkaline phosphatase (APISO) 99 33 - 130 U/L   AST 13 10 - 35 U/L   ALT 10 6 - 29 U/L  Magnesium  Result Value Ref Range   Magnesium 1.9 1.5 - 2.5 mg/dL  Phosphorus  Result Value Ref Range   Phosphorus 4.2 2.5 - 4.5 mg/dL      Assessment & Plan:   Problem List Items Addressed This Visit      Other   Chronic pain syndrome - Primary (Chronic)   Relevant Medications   tiZANidine (ZANAFLEX) 4 MG tablet   Chronic shoulder pain (Location of Primary Source of Pain) (Bilateral) (L>R) (Chronic)    Relevant Medications   tiZANidine (ZANAFLEX) 4 MG tablet  Chronic hip pain (Location of Secondary source of pain) (Bilateral) (L>R) (Chronic)   Relevant Medications   tiZANidine (ZANAFLEX) 4 MG tablet    Other Visit Diagnoses    Seasonal allergies       Relevant Medications   montelukast (SINGULAIR) 10 MG tablet   Trigger point of left shoulder region       Relevant Medications   lidocaine (PF) (XYLOCAINE) 1 % injection 6 mL (Completed)   Trigger point of extremity       Relevant Medications   lidocaine (PF) (XYLOCAINE) 1 % injection 6 mL (Completed)      Chronic Pain Syndrome / Trigger points Patient with chronic pain and good to moderate relief with trigger point injections.  Pain recurrent with return of trigger point pain approximately 4-5 weeks after last trigger point injections. Repeat trigger point injections today after last injections performed 10/01/2018 by Dr. Parks Ranger. - See procedure note below for three trigger point injections.  Used 2 mL lidocaine 1% in each trigger point. - Follow-up 4-5 weeks for additional injections prn.  Seasonal allergies, eustachian tube dysfunction Uncontrolled at this time with effusion LEFT ear.  No active infection.  Relief with Singulair, needs refill. - Take Singulair 10 mg once daily - Continue Flonase and Astelin daily - Consider repeat visit to ENT if needed - May need prednisone for eustachian tube dysfunction if not improving - FOLLOW-UP 2-4 weeks prn  Meds ordered this encounter  Medications  . montelukast (SINGULAIR) 10 MG tablet    Sig: Take 1 tablet (10 mg total) by mouth at bedtime.    Dispense:  30 tablet    Refill:  3    Order Specific Question:   Supervising Provider    Answer:   Olin Hauser [[id number]]  . tiZANidine (ZANAFLEX) 4 MG tablet    Sig: TAKE ONE TABLET FOUR TIMES DAILY AS NEEDED FOR MUSCLE SPASM    Dispense:  360 tablet    Refill:  1    Order Specific Question:   Supervising Provider     Answer:   Olin Hauser [[id number]]  . lidocaine (PF) (XYLOCAINE) 1 % injection 6 mL   PROCEDURE NOTE Date: 11/13/18  Trigger point injections Discussed benefits and risks (including pain, bleeding, infection, steroid flare). Verbal consent given by patient.    The trigger points at Left subscapularis, LEFT lateral hip x 2 were identified and confirmed by the patient. The spots were marked. Skin was cleaned with alcohol. 2 cc of 1% lidocaine was injected in a hub and spoke fashion in the area. Procedure repeated for each trigger point. Cold spray used for superficial anesthetic.Patient tolerated procedure well without bleeding or paresthesias.No complications.  Patient was able to verbalize when specific trigger point was injected with lidocaine and successful injection was obtained in all 3 locations per patient feedback.  Follow up plan: Return in about 4 weeks (around 12/11/2018) for trigger point injections.  Cassell Smiles, DNP, AGPCNP-BC Adult Gerontology Primary Care Nurse Practitioner [facility] 11/13/2018, 8:14 AM

## 2018-11-13 NOTE — Patient Instructions (Addendum)
[facility] [place],   Thank you for coming in to clinic today.  1. Continue Flonase and Astelin - RESTART Singulair  2. Trigger point injections again in about 4 weeks. - Continue tizanidine  Please schedule a follow-up appointment with Cassell Smiles, AGNP. Return in about 4 weeks (around 12/11/2018) for trigger point injections.  If you have any other questions or concerns, please feel free to call the clinic or send a message through [place]. You may also schedule an earlier appointment if necessary.  You will receive a survey after today's visit either digitally by e-mail or paper by [employer]. Your experiences and feedback matter to [place].  Please respond so we know how we are doing as we provide care for you.   Cassell Smiles, DNP, AGNP-BC Adult Gerontology Nurse Practitioner [place]

## 2018-12-10 ENCOUNTER — Ambulatory Visit
Admission: RE | Admit: 2018-12-10 | Discharge: 2018-12-10 | Disposition: A | Discharge status: Home / Self Care | Payer: PPO | Source: Ambulatory Visit | Attending: Nurse Practitioner | Admitting: Nurse Practitioner

## 2018-12-10 DIAGNOSIS — Z1231 Encounter for screening mammogram for malignant neoplasm of breast: Secondary | ICD-10-CM | POA: Diagnosis not present

## 2018-12-11 ENCOUNTER — Other Ambulatory Visit: Payer: Self-pay

## 2018-12-11 ENCOUNTER — Encounter: Payer: Self-pay | Admitting: Nurse Practitioner

## 2018-12-11 ENCOUNTER — Ambulatory Visit (INDEPENDENT_AMBULATORY_CARE_PROVIDER_SITE_OTHER): Payer: PPO | Admitting: Nurse Practitioner

## 2018-12-11 VITALS — BP 120/64 | HR 73 | Ht 64.0 in | Wt 177.2 lb

## 2018-12-11 DIAGNOSIS — G8929 Other chronic pain: Secondary | ICD-10-CM | POA: Diagnosis not present

## 2018-12-11 DIAGNOSIS — J302 Other seasonal allergic rhinitis: Secondary | ICD-10-CM | POA: Diagnosis not present

## 2018-12-11 DIAGNOSIS — F3289 Other specified depressive episodes: Secondary | ICD-10-CM

## 2018-12-11 DIAGNOSIS — M25559 Pain in unspecified hip: Secondary | ICD-10-CM

## 2018-12-11 DIAGNOSIS — M79609 Pain in unspecified limb: Secondary | ICD-10-CM

## 2018-12-11 DIAGNOSIS — M25512 Pain in left shoulder: Secondary | ICD-10-CM

## 2018-12-11 DIAGNOSIS — M25511 Pain in right shoulder: Secondary | ICD-10-CM

## 2018-12-11 DIAGNOSIS — G894 Chronic pain syndrome: Secondary | ICD-10-CM | POA: Diagnosis not present

## 2018-12-11 MED ORDER — MONTELUKAST SODIUM 10 MG PO TABS: 10.0000 mg | ORAL_TABLET | Freq: Every day | ORAL | 3 refills | Status: DC

## 2018-12-11 MED ORDER — LIDOCAINE HCL (PF) 1 % IJ SOLN
6.0000 mL | Freq: Once | INTRAMUSCULAR | Status: AC
  Administered 2018-12-11: 6 mL

## 2018-12-11 NOTE — Progress Notes (Signed)
Subjective:    Patient ID: April Price, female    DOB: Apr 10, 1956, 63 y.o.   MRN: [id number]  April Price is a 63 y.o. female presenting on 12/11/2018 for Shoulder Pain (chronic left side shoulder and hip pain)  HPI Chronic Pain Syndrome LEFT shoulder and LEFT hip s/p lidocaine only trigger point injections last on 11/13/2018.    Patient received trigger point injection into two left hip trigger points, which provided longer relief (4-5 weeks instead of 2 weeks).  Right posterior shoulder also with good relief through last week.  Patient notes likely trigger was weather, cool and damp on Thursday.  "Feels a pressure inside" when weather is like this. - Two injections into hip were "much better."  When pain relief subsides, patient begins having spasms in back, chest wall, leg.  Radiating pain from her hip trigger points.  Tizanidine helps these some. Heat helps. - RIGHT shoulder/Subscap not currently bothering patient.  Singulair - no dizziness when she is on this medication- needs refill  Depression Feels mostly stable on current regimen.  "I have my moments."   - Patient attracts people - listens.  Negativity of neighbors gets to her occasionally.  Patient notes this is what makes it hard.  States she internalizes people's stories and does not want to be surrounded with negativity.  Tries her best to stay positive and upbeat despite chronic pain/societal circumstances (Covid isolation, political climate)  Depression screen [facility] 2/9 12/11/2018 08/29/2018 06/13/2018 05/21/2018 05/02/2018  Decreased Interest 0 1 0 0 0  Down, Depressed, Hopeless 1 2 1 1  0  PHQ - 2 Score 1 3 1 1  0  Altered sleeping 0 2 3 - 1  Tired, decreased energy 0 1 0 - 3  Change in appetite 0 1 0 - 0  Feeling bad or failure about yourself  0 0 0 - 0  Trouble concentrating 0 0 0 - 1  Moving slowly or fidgety/restless 0 0 0 - 0  Suicidal thoughts 0 0 0 - 0  PHQ-9 Score 1 7 4  - 5  Difficult doing work/chores  Somewhat difficult Not difficult at all Not difficult at all - Somewhat difficult  Some recent data might be hidden    Social History   Tobacco Use  . Smoking status: Current Every Day Smoker    Packs/day: 0.25    Years: 40.00    Pack years: 10.00    Types: Cigarettes  . Smokeless tobacco: Never Used  Substance Use Topics  . Alcohol use: No  . Drug use: No    Review of Systems Per HPI unless specifically indicated above     Objective:    BP 120/64 (BP Location: Left Arm, Patient Position: Sitting, Cuff Size: Normal)   Pulse 73   Ht 5\' 4"  (1.626 m)   Wt 177 lb 3.2 oz (80.4 kg)   BMI 30.42 kg/m   Wt Readings from Last 3 Encounters:  12/11/18 177 lb 3.2 oz (80.4 kg)  11/13/18 173 lb 3.2 oz (78.6 kg)  10/01/18 172 lb (78 kg)    Physical Exam Vitals signs reviewed.  Constitutional:      General: She is not in acute distress.    Appearance: She is well-developed.  HENT:     Head: Normocephalic and atraumatic.     Nose: Nose normal.     Mouth/Throat:     Lips: Pink.     Mouth: Mucous membranes are moist.     Pharynx: Oropharynx is  clear.  Cardiovascular:     Rate and Rhythm: Normal rate and regular rhythm.     Pulses:          Radial pulses are 2+ on the right side and 2+ on the left side.       Posterior tibial pulses are 1+ on the right side and 1+ on the left side.     Heart sounds: Normal heart sounds, S1 normal and S2 normal.  Pulmonary:     Effort: Pulmonary effort is normal. No respiratory distress.     Breath sounds: Normal breath sounds and air entry.  Musculoskeletal:     Right lower leg: No edema.     Left lower leg: No edema.     Comments: Well defined tender trigger points located at: 1- Left subscap 2- LEFT lateral hip with two trigger points within same hypertonic muscle  Skin:    General: Skin is warm and dry.     Capillary Refill: Capillary refill takes less than 2 seconds.  Neurological:     General: No focal deficit present.     Mental  Status: She is alert and oriented to person, place, and time. Mental status is at baseline.  Psychiatric:        Attention and Perception: Attention normal.        Mood and Affect: Mood and affect normal.        Behavior: Behavior normal. Behavior is cooperative.        Thought Content: Thought content normal.        Judgment: Judgment normal.    Results for orders placed or performed in visit on 08/01/17  COMPLETE METABOLIC PANEL WITH GFR  Result Value Ref Range   Glucose, Bld 97 65 - 99 mg/dL   BUN 17 7 - 25 mg/dL   Creat 1.07 (H) 0.50 - 0.99 mg/dL   GFR, Est Non African American 56 (L) > OR = 60 mL/min/1.46m2   GFR, Est African American 65 > OR = 60 mL/min/1.41m2   BUN/Creatinine Ratio 16 6 - 22 (calc)   Sodium 136 135 - 146 mmol/L   Potassium 4.4 3.5 - 5.3 mmol/L   Chloride 106 98 - 110 mmol/L   CO2 23 20 - 32 mmol/L   Calcium 9.7 8.6 - 10.4 mg/dL   Total Protein 6.9 6.1 - 8.1 g/dL   Albumin 4.0 3.6 - 5.1 g/dL   Globulin 2.9 1.9 - 3.7 g/dL (calc)   AG Ratio 1.4 1.0 - 2.5 (calc)   Total Bilirubin 0.5 0.2 - 1.2 mg/dL   Alkaline phosphatase (APISO) 99 33 - 130 U/L   AST 13 10 - 35 U/L   ALT 10 6 - 29 U/L  Magnesium  Result Value Ref Range   Magnesium 1.9 1.5 - 2.5 mg/dL  Phosphorus  Result Value Ref Range   Phosphorus 4.2 2.5 - 4.5 mg/dL      Assessment & Plan:   Problem List Items Addressed This Visit      Other   Chronic pain syndrome - Primary (Chronic)   Chronic shoulder pain (Location of Primary Source of Pain) (Bilateral) (L>R) (Chronic)   Chronic hip pain (Location of Secondary source of pain) (Bilateral) (L>R) (Chronic)   Depression    Other Visit Diagnoses    Seasonal allergies       Relevant Medications   montelukast (SINGULAIR) 10 MG tablet   Trigger point of left shoulder region       Relevant Medications  lidocaine (PF) (XYLOCAINE) 1 % injection 6 mL (Completed)   Trigger point of extremity       Relevant Medications   lidocaine (PF)  (XYLOCAINE) 1 % injection 6 mL (Completed)      Seasonal allergies improved on singulair - continue.  Refill provided.  Follow-up 3-6 months and prn.  Depression currently stable and controlled with warmer weather and good pain relief.  Continue current medications.  Chronic Pain Syndrome / Trigger points Patient with chronic pain and good to moderate relief with trigger point injections.  Pain recurrent with return of trigger point pain approximately 4-5 weeks after last trigger point injections. Repeat trigger point injections today after last injections performed 11/13/2018. - See procedure note below for three trigger point injections.  Used 2 mL lidocaine 1% in each trigger point. - Continue use of heat, self massage - Follow-up 4-5 weeks for additional injections prn.    PROCEDURE NOTE Date: 12/11/2018  Trigger point injections Discussed benefits and risks (including pain, bleeding, infection, steroid flare). Verbal consent given by patient.    The trigger points at Left subscapularis, LEFT lateral hip x 2 were identified and confirmed by the patient. The spots were marked. Skin was cleaned with alcohol. 2 cc of 1% lidocaine was injected in a hub and spoke fashion in the area. Procedure repeated for each trigger point. Cold spray used for superficial anesthetic.Patient tolerated procedure well without bleeding or paresthesias.No complications.  Patient was able to verbalize when specific trigger point was injected with lidocaine and successful injection was obtained in all 3 locations per patient feedback.  Meds ordered this encounter  Medications  . montelukast (SINGULAIR) 10 MG tablet    Sig: Take 1 tablet (10 mg total) by mouth at bedtime.    Dispense:  90 tablet    Refill:  3    Order Specific Question:   Supervising Provider    Answer:   Olin Hauser [[id number]]  . lidocaine (PF) (XYLOCAINE) 1 % injection 6 mL    Follow up plan: Return in about 5 weeks (around  01/15/2019).  Cassell Smiles, DNP, AGPCNP-BC Adult Gerontology Primary Care Nurse Practitioner [facility] 12/11/2018, 8:38 AM

## 2018-12-11 NOTE — Patient Instructions (Addendum)
[facility] [place],   Thank you for coming in to clinic today.  1. You had trigger point injections today.  Rest today.  2. Continue singulair.  3. Continue all other medications without changes.  Please schedule a follow-up appointment with Cassell Smiles, AGNP. Return in about 5 weeks (around 01/15/2019).  If you have any other questions or concerns, please feel free to call the clinic or send a message through [place]. You may also schedule an earlier appointment if necessary.  You will receive a survey after today's visit either digitally by e-mail or paper by [employer]. Your experiences and feedback matter to [place].  Please respond so we know how we are doing as we provide care for you.   Cassell Smiles, DNP, AGNP-BC Adult Gerontology Nurse Practitioner [place]

## 2019-01-13 DIAGNOSIS — H353131 Nonexudative age-related macular degeneration, bilateral, early dry stage: Secondary | ICD-10-CM | POA: Diagnosis not present

## 2019-01-15 ENCOUNTER — Other Ambulatory Visit: Payer: Self-pay

## 2019-01-15 ENCOUNTER — Ambulatory Visit (INDEPENDENT_AMBULATORY_CARE_PROVIDER_SITE_OTHER): Payer: PPO | Admitting: Nurse Practitioner

## 2019-01-15 ENCOUNTER — Encounter: Payer: Self-pay | Admitting: Nurse Practitioner

## 2019-01-15 VITALS — BP 164/81 | HR 80 | Ht 64.0 in | Wt 179.4 lb

## 2019-01-15 DIAGNOSIS — M25559 Pain in unspecified hip: Secondary | ICD-10-CM

## 2019-01-15 DIAGNOSIS — Z23 Encounter for immunization: Secondary | ICD-10-CM

## 2019-01-15 DIAGNOSIS — G8929 Other chronic pain: Secondary | ICD-10-CM | POA: Diagnosis not present

## 2019-01-15 DIAGNOSIS — G894 Chronic pain syndrome: Secondary | ICD-10-CM | POA: Diagnosis not present

## 2019-01-15 DIAGNOSIS — M25512 Pain in left shoulder: Secondary | ICD-10-CM | POA: Diagnosis not present

## 2019-01-15 DIAGNOSIS — M25511 Pain in right shoulder: Secondary | ICD-10-CM

## 2019-01-15 MED ORDER — LIDOCAINE HCL (PF) 1 % IJ SOLN
4.0000 mL | Freq: Once | INTRAMUSCULAR | Status: AC
  Administered 2019-01-15: 4 mL

## 2019-01-15 NOTE — Patient Instructions (Addendum)
[facility] [place],   Thank you for coming in to clinic today.  1. You had trigger point injections today. Rest today. - Continue heat therapy and self massage between visits using theracane, racquet ball/tennis ball as needed.  Please schedule a follow-up appointment. Return in about 5 weeks (around 02/19/2019) for trigger point injections, chronic pain.  If you have any other questions or concerns, please feel free to call the clinic or send a message through [place]. You may also schedule an earlier appointment if necessary.  You will receive a survey after today's visit either digitally by e-mail or paper by [employer]. Your experiences and feedback matter to [place].  Please respond so we know how we are doing as we provide care for you.  Cassell Smiles, DNP, AGNP-BC Adult Gerontology Nurse Practitioner [place]

## 2019-01-15 NOTE — Progress Notes (Signed)
Subjective:    Patient ID: April Price, female    DOB: 1956/04/07, 63 y.o.   MRN: [id number]  April Price is a 63 y.o. female presenting on 01/15/2019 for Shoulder Pain (chronic hip pain. Trigger point injection)   HPI Trigger point injections, spasm Patient is "not certain" she needs an injection Left shoulder  Notes pain is increasing, but not severe. Notes she may not have trigger point return.  Right shoulder remains okay as long as she limits lifting.  LEFT hip started hurting again about 3 weeks after last injection.  Patient notes the longer from injection, the more cramping she has in left leg from hip trigger point.   - Patient also endorses posterior rib cage cramps.  Has not identified any trigger point linked to these.  Patient stopped tizanidine x 3 days, but no improvement in efficacy with break. - Cooler weather is causing increased pain.  For now, moods remain stable.  Social History   Tobacco Use  . Smoking status: Current Every Day Smoker    Packs/day: 0.25    Years: 40.00    Pack years: 10.00    Types: Cigarettes  . Smokeless tobacco: Never Used  Substance Use Topics  . Alcohol use: No  . Drug use: No    Review of Systems Per HPI unless specifically indicated above     Objective:    BP (!) 164/81 (BP Location: Right Arm, Patient Position: Sitting, Cuff Size: Normal)   Pulse 80   Ht 5\' 4"  (1.626 m)   Wt 179 lb 6.4 oz (81.4 kg)   BMI 30.79 kg/m   Wt Readings from Last 3 Encounters:  01/15/19 179 lb 6.4 oz (81.4 kg)  12/11/18 177 lb 3.2 oz (80.4 kg)  11/13/18 173 lb 3.2 oz (78.6 kg)    Physical Exam Vitals signs reviewed.  Constitutional:      General: She is not in acute distress.    Appearance: She is well-developed.  HENT:     Head: Normocephalic and atraumatic.     Nose: Nose normal.     Mouth/Throat:     Lips: Pink.     Mouth: Mucous membranes are moist.     Pharynx: Oropharynx is clear.  Cardiovascular:     Rate  and Rhythm: Normal rate and regular rhythm.     Pulses:          Radial pulses are 2+ on the right side and 2+ on the left side.       Posterior tibial pulses are 1+ on the right side and 1+ on the left side.     Heart sounds: Normal heart sounds, S1 normal and S2 normal.  Pulmonary:     Effort: Pulmonary effort is normal. No respiratory distress.     Breath sounds: Normal breath sounds and air entry.  Musculoskeletal:     Right lower leg: No edema.     Left lower leg: No edema.     Comments: Well defined tender trigger points located at: 1- LEFT lateral hip with two trigger points within same hypertonic muscle  Left subscap with muscle hypertonicity, but no trigger point today.  Skin:    General: Skin is warm and dry.     Capillary Refill: Capillary refill takes less than 2 seconds.  Neurological:     General: No focal deficit present.     Mental Status: She is alert and oriented to person, place, and time. Mental status is at baseline.  Psychiatric:        Attention and Perception: Attention normal.        Mood and Affect: Mood and affect normal.        Behavior: Behavior normal. Behavior is cooperative.        Thought Content: Thought content normal.        Judgment: Judgment normal.    Results for orders placed or performed in visit on 08/01/17  COMPLETE METABOLIC PANEL WITH GFR  Result Value Ref Range   Glucose, Bld 97 65 - 99 mg/dL   BUN 17 7 - 25 mg/dL   Creat 1.07 (H) 0.50 - 0.99 mg/dL   GFR, Est Non African American 56 (L) > OR = 60 mL/min/1.58m2   GFR, Est African American 65 > OR = 60 mL/min/1.41m2   BUN/Creatinine Ratio 16 6 - 22 (calc)   Sodium 136 135 - 146 mmol/L   Potassium 4.4 3.5 - 5.3 mmol/L   Chloride 106 98 - 110 mmol/L   CO2 23 20 - 32 mmol/L   Calcium 9.7 8.6 - 10.4 mg/dL   Total Protein 6.9 6.1 - 8.1 g/dL   Albumin 4.0 3.6 - 5.1 g/dL   Globulin 2.9 1.9 - 3.7 g/dL (calc)   AG Ratio 1.4 1.0 - 2.5 (calc)   Total Bilirubin 0.5 0.2 - 1.2 mg/dL    Alkaline phosphatase (APISO) 99 33 - 130 U/L   AST 13 10 - 35 U/L   ALT 10 6 - 29 U/L  Magnesium  Result Value Ref Range   Magnesium 1.9 1.5 - 2.5 mg/dL  Phosphorus  Result Value Ref Range   Phosphorus 4.2 2.5 - 4.5 mg/dL      Assessment & Plan:   Problem List Items Addressed This Visit      Other   Chronic pain syndrome - Primary (Chronic)   Chronic shoulder pain (Location of Primary Source of Pain) (Bilateral) (L>R) (Chronic)   Chronic hip pain (Location of Secondary source of pain) (Bilateral) (L>R) (Chronic)    Other Visit Diagnoses    Needs flu shot       Relevant Orders   Flu Vaccine QUAD 6+ mos PF IM (Fluarix Quad PF) (Completed)      Meds ordered this encounter  Medications  . lidocaine (PF) (XYLOCAINE) 1 % injection 4 mL   Patient with chronic pain and good to moderate relief with trigger point injections.  Pain recurrent with return of trigger point pain approximately 4-5 weeks after last trigger point injections. Repeat trigger point injections today after last injections performed 12/11/2018. - See procedure note below for three trigger point injections.  Used 2 mL lidocaine 1% in each trigger point. - Continue use of heat, self massage - Follow-up 4-5 weeks for additional injections prn.  PROCEDURE NOTE Date: 01/15/2019  Trigger point injections Discussed benefits and risks (including pain, bleeding, infection, steroid flare). Verbal consent given by patient.    The trigger points at LEFT lateral hip x 2 were identified and confirmed by the patient. The spots were marked. Skin was cleaned with alcohol. 2 cc of 1% lidocaine was injected in a hub and spoke fashion in the area. Procedure repeated for each trigger point. Cold spray used for superficial anesthetic.Patient tolerated procedure well without bleeding or paresthesias.No complications.  Patient was able to verbalize when specific trigger point was injected with lidocaine and successful injection was  obtained in all 3 locations per patient feedback.   Follow up plan: Return in about  5 weeks (around 02/19/2019) for trigger point injections, chronic pain.  Cassell Smiles, DNP, AGPCNP-BC Adult Gerontology Primary Care Nurse Practitioner [facility] 01/15/2019, 8:15 AM

## 2019-01-25 ENCOUNTER — Encounter: Payer: Self-pay | Admitting: Nurse Practitioner

## 2019-02-04 ENCOUNTER — Telehealth: Payer: Self-pay | Admitting: Nurse Practitioner

## 2019-02-04 NOTE — Chronic Care Management (AMB) (Signed)
°  Chronic Care Management   Outreach Note  02/04/2019 Name: April Price MRN: [id number] DOB: 04/04/1956  Referred by: Mikey College, NP Reason for referral : Chronic Care Management (Initial CCM outreach was unsuccessful.)   An unsuccessful telephone outreach was attempted today. The patient was referred to the case management team by for assistance with care management and care coordination.   Follow Up Plan: A HIPPA compliant phone message was left for the patient providing contact information and requesting a return call.  The care management team will reach out to the patient again over the next 7 days.  If patient returns call to provider office, please advise to call [place] at [place]  [email or web address]   ??[phone]

## 2019-02-04 NOTE — Chronic Care Management (AMB) (Signed)
Chronic Care Management   Note  02/04/2019 Name: April Price MRN: [id number] DOB: 1955/11/09  April Price is a 63 y.o. year old female who is a primary care patient of Mikey College, NP. I reached out to [name] by phone today in response to a referral sent by April Price's health plan.     April Price was given information about Chronic Care Management services today including:  1. CCM service includes personalized support from designated clinical staff supervised by her physician, including individualized plan of care and coordination with other care providers 2. 24/7 contact phone numbers for assistance for urgent and routine care needs. 3. Service will only be billed when office clinical staff spend 20 minutes or more in a month to coordinate care. 4. Only one practitioner may furnish and bill the service in a calendar month. 5. The patient may stop CCM services at any time (effective at the end of the month) by phone call to the office staff. 6. The patient will be responsible for cost sharing (co-pay) of up to 20% of the service fee (after annual deductible is met).  Patient agreed to services and verbal consent obtained.   Follow up plan: Telephone appointment with CCM team member scheduled for: 03/13/2019  [place]  [email or web address]   ??[phone]

## 2019-02-18 ENCOUNTER — Other Ambulatory Visit: Payer: Self-pay

## 2019-02-18 ENCOUNTER — Other Ambulatory Visit: Payer: Self-pay | Admitting: Nurse Practitioner

## 2019-02-18 ENCOUNTER — Ambulatory Visit (INDEPENDENT_AMBULATORY_CARE_PROVIDER_SITE_OTHER): Payer: PPO | Admitting: Family Medicine

## 2019-02-18 ENCOUNTER — Encounter: Payer: Self-pay | Admitting: Family Medicine

## 2019-02-18 VITALS — BP 138/70 | HR 85 | Temp 98.2°F | Resp 16 | Ht 64.0 in | Wt 174.0 lb

## 2019-02-18 DIAGNOSIS — G894 Chronic pain syndrome: Secondary | ICD-10-CM

## 2019-02-18 DIAGNOSIS — J320 Chronic maxillary sinusitis: Secondary | ICD-10-CM

## 2019-02-18 DIAGNOSIS — H6982 Other specified disorders of Eustachian tube, left ear: Secondary | ICD-10-CM

## 2019-02-18 MED ORDER — MECLIZINE HCL 25 MG PO TABS: 25.0000 mg | ORAL_TABLET | Freq: Three times a day (TID) | ORAL | 2 refills | Status: DC | PRN

## 2019-02-18 MED ORDER — PREDNISONE 10 MG PO TABS: ORAL_TABLET | ORAL | 0 refills | Status: DC

## 2019-02-18 NOTE — Progress Notes (Signed)
Subjective:    Patient ID: April Price, female    DOB: 1955-12-30, 63 y.o.   MRN: [id number]  April Price is a 63 y.o. female presenting on 02/18/2019 for Dizziness   HPI   Seasonal Allergies / Sinusitis / Vertigo dizziness Previously seen by PCP for this issue earlier in 2020. She has She has had chronic issues with L ear and has had x 3 tymp tubes as an adult, had seen Dr Inda [facility] ENT, she has had had prior multiple 2-3 sinus surgeries int he past. 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 She has issues with ear drum popping as well. - Has walker to assist with ambulation, affected her balance, concern with difficulty of risk of fall - improved on meclizine requests refill - She takes Singulair 10mg  daily, Azelastine nasal anti histamine, Flonase nasal steroid - Rarely takes oral steroid for this as needed Denies any headache, loss of vision, numbness weakness or tingling, dyspnea, productive cough fever chills  She will re-schedule for trigger point injections   Depression screen [facility] 2/9 02/18/2019 12/11/2018 08/29/2018  Decreased Interest 0 0 1  Down, Depressed, Hopeless 0 1 2  PHQ - 2 Score 0 1 3  Altered sleeping - 0 2  Tired, decreased energy - 0 1  Change in appetite - 0 1  Feeling bad or failure about yourself  - 0 0  Trouble concentrating - 0 0  Moving slowly or fidgety/restless - 0 0  Suicidal thoughts - 0 0  PHQ-9 Score - 1 7  Difficult doing work/chores - Somewhat difficult Not difficult at all  Some recent data might be hidden    Social History   Tobacco Use  . Smoking status: Current Every Day Smoker    Packs/day: 0.25    Years: 40.00    Pack years: 10.00    Types: Cigarettes  . Smokeless tobacco: Current User  Substance Use Topics  . Alcohol use: No  . Drug use: No    Review of Systems Per HPI unless specifically indicated  above     Objective:    BP 138/70 (BP Location: Left Arm, Cuff Size: Normal)   Pulse 85   Temp 98.2 F (36.8 C) (Oral)   Resp 16   Ht 5\' 4"  (S99990927 m)   Wt 174 lb (78.9 kg)   BMI 29.87 kg/m   Wt Readings from Last 3 Encounters:  02/18/19 174 lb (78.9 kg)  01/15/19 179 lb 6.4 oz (81.4 kg)  12/11/18 177 lb 3.2 oz (80.4 kg)    Physical Exam Vitals signs and nursing note reviewed.  Constitutional:      General: She is not in acute distress.    Appearance: She is well-developed. She is not diaphoretic.     Comments: Well-appearing, comfortable, cooperative, using cane  HENT:     Head: Normocephalic and atraumatic.     Ears:     Comments: Left ear canal with small ball of wax debris, not impacted. Mild fluid behind L ear, some old scaring on TM. No erythema or other abnormality. Eyes:     General:        Right eye: No discharge.        Left eye: No discharge.     Conjunctiva/sclera: Conjunctivae normal.  Cardiovascular:     Rate and Rhythm: Normal rate.  Pulmonary:     Effort: Pulmonary effort is  normal.  Musculoskeletal:     Comments: Able to ambulate with cane  Skin:    General: Skin is warm and dry.     Findings: No erythema or rash.  Neurological:     Mental Status: She is alert and oriented to person, place, and time.  Psychiatric:        Behavior: Behavior normal.     Comments: Well groomed, good eye contact, normal speech and thoughts    Results for orders placed or performed in visit on 08/01/17  COMPLETE METABOLIC PANEL WITH GFR  Result Value Ref Range   Glucose, Bld 97 65 - 99 mg/dL   BUN 17 7 - 25 mg/dL   Creat 1.07 (H) 0.50 - 0.99 mg/dL   GFR, Est Non African American 56 (L) > OR = 60 mL/min/1.35m2   GFR, Est African American 65 > OR = 60 mL/min/1.59m2   BUN/Creatinine Ratio 16 6 - 22 (calc)   Sodium 136 135 - 146 mmol/L   Potassium 4.4 3.5 - 5.3 mmol/L   Chloride 106 98 - 110 mmol/L   CO2 23 20 - 32 mmol/L   Calcium 9.7 8.6 - 10.4 mg/dL   Total  Protein 6.9 6.1 - 8.1 g/dL   Albumin 4.0 3.6 - 5.1 g/dL   Globulin 2.9 1.9 - 3.7 g/dL (calc)   AG Ratio 1.4 1.0 - 2.5 (calc)   Total Bilirubin 0.5 0.2 - 1.2 mg/dL   Alkaline phosphatase (APISO) 99 33 - 130 U/L   AST 13 10 - 35 U/L   ALT 10 6 - 29 U/L  Magnesium  Result Value Ref Range   Magnesium 1.9 1.5 - 2.5 mg/dL  Phosphorus  Result Value Ref Range   Phosphorus 4.2 2.5 - 4.5 mg/dL      Assessment & Plan:   Problem List Items Addressed This Visit    None    Visit Diagnoses    Chronic maxillary sinusitis    -  Primary   Relevant Medications   predniSONE (DELTASONE) 10 MG tablet   Chronic dysfunction of left eustachian tube       Relevant Medications   meclizine (ANTIVERT) 25 MG tablet   predniSONE (DELTASONE) 10 MG tablet      Subacute on chronic Recurrent L > R bilateral eustachian tube dysfunction with secondary L ear effusion, without loss of hearing or evidence of AOM or sinusitis. 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 Also chronic sinus problems, with complicated prior history multiple surgeries / tymp tubes etc.  No sign of bacterial infection today, not offer antibiotic  Plan: 1. ADD Prednisone burst today for 6 day taper 60 to 10mg  then off 2. CONTINUE current nearly maximal therapy with Singulair, Flonase, Azelastine - Add Meclizine PRN has used before with good results, for vertigo / dizzy - Can do home epley maneuver if needed 3. Follow-up if not improved or worsening, return criteria  Offer to return to [facility] Dr Kathyrn Sheriff, long history with ENT if needed.   Meds ordered this encounter  Medications  . meclizine (ANTIVERT) 25 MG tablet    Sig: Take 1 tablet (25 mg total) by mouth 3 (three) times daily as needed for dizziness.    Dispense:  30 tablet    Refill:  2  . predniSONE (DELTASONE) 10 MG tablet    Sig: Take 6 tabs with breakfast Day 1, 5 tabs Day 2, 4 tabs Day 3, 3 tabs Day 4, 2 tabs Day 5, 1  tab Day 6.     Dispense:  21 tablet    Refill:  0      Follow up plan: Return in about 2 weeks (around 03/04/2019), or if symptoms worsen or fail to improve, for sinusitis.   Nobie Putnam, DO [facility] 02/18/2019, 8:08 AM

## 2019-02-18 NOTE — Patient Instructions (Addendum)
Thank you for coming to the office today.  Start Prednisone taper and Meclizine as needed. If need extended Prednisone let us know.  If not improving we can return to Dr Kathyrn Sheriff [facility]  Continue other sinus medications.  Please schedule a Follow-up Appointment to: Return in about 2 weeks (around 03/04/2019), or if symptoms worsen or fail to improve, for sinusitis.  If you have any other questions or concerns, please feel free to call the office or send a message through [place]. You may also schedule an earlier appointment if necessary.  Additionally, you may be receiving a survey about your experience at our office within a few days to 1 week by e-mail or mail. We value your feedback.  Nobie Putnam, DO [place]

## 2019-02-25 ENCOUNTER — Ambulatory Visit: Payer: PPO | Admitting: Family Medicine

## 2019-02-28 ENCOUNTER — Other Ambulatory Visit: Payer: Self-pay

## 2019-02-28 DIAGNOSIS — J3089 Other allergic rhinitis: Secondary | ICD-10-CM

## 2019-02-28 MED ORDER — FLUTICASONE PROPIONATE 50 MCG/ACT NA SUSP: 2.0000 | Freq: Every day | NASAL | 6 refills | Status: DC

## 2019-03-05 ENCOUNTER — Ambulatory Visit (INDEPENDENT_AMBULATORY_CARE_PROVIDER_SITE_OTHER): Payer: PPO | Admitting: Family Medicine

## 2019-03-05 ENCOUNTER — Other Ambulatory Visit: Payer: Self-pay

## 2019-03-05 ENCOUNTER — Encounter: Payer: Self-pay | Admitting: Family Medicine

## 2019-03-05 VITALS — BP 151/71 | HR 71 | Temp 97.3°F | Resp 16 | Ht 64.0 in | Wt 184.6 lb

## 2019-03-05 DIAGNOSIS — M25559 Pain in unspecified hip: Secondary | ICD-10-CM

## 2019-03-05 DIAGNOSIS — G894 Chronic pain syndrome: Secondary | ICD-10-CM

## 2019-03-05 DIAGNOSIS — M797 Fibromyalgia: Secondary | ICD-10-CM

## 2019-03-05 DIAGNOSIS — G8929 Other chronic pain: Secondary | ICD-10-CM

## 2019-03-05 DIAGNOSIS — M79609 Pain in unspecified limb: Secondary | ICD-10-CM

## 2019-03-05 MED ORDER — LIDOCAINE HCL (PF) 1 % IJ SOLN
2.0000 mL | Freq: Once | INTRAMUSCULAR | Status: AC
  Administered 2019-03-05: 2 mL

## 2019-03-05 NOTE — Progress Notes (Signed)
Subjective:    Patient ID: April Price, female    DOB: 08-02-1955, 63 y.o.   MRN: [id number]  April Price is a 63 y.o. female presenting on 03/05/2019 for Hip Pain   HPI   Chronic Pain Syndrome / Left Hip Pain  - Last visit with me 09/2018 for same trigger point injection, she had see prior PCP Cassell Smiles, AGPCNP-BC in 01/2019 for prior injection as well, treated with trigger point inj with lidocaine and not steroid, see prior notes for background information. - Interval update with now her pain has gradually increased - last injection wore off 1 week ago approx, now some increase pain worse with ambulation. She uses cane sometimes. She does not need other trigger points, today only requesting L hip gluteal. She has increasing pain with fibromyalgia prior to rain or other weather. - Prior injections last for several weeks to month Additional history - Followed by Pain Management [facility] / has had extensive therapy in past including joint steroid injections, trigger point injections, PT. - She goes to pain management once yearly - Currently taking Cymbalta 60mg  daily, Tizanidine 4mg  as needed - limited results - No recent injury or trauma or worsening pain - Denies any fevers chills, nausea vomiting, skin rash redness, edema, any new numbness tingling weakness  Depression screen [facility] 2/9 03/05/2019 02/18/2019 12/11/2018  Decreased Interest 0 0 0  Down, Depressed, Hopeless 0 0 1  PHQ - 2 Score 0 0 1  Altered sleeping - - 0  Tired, decreased energy - - 0  Change in appetite - - 0  Feeling bad or failure about yourself  - - 0  Trouble concentrating - - 0  Moving slowly or fidgety/restless - - 0  Suicidal thoughts - - 0  PHQ-9 Score - - 1  Difficult doing work/chores - - Somewhat difficult  Some recent data might be hidden    Social History   Tobacco Use  . Smoking status: Current Every Day Smoker    Packs/day: 0.25    Years: 40.00   Pack years: 10.00    Types: Cigarettes  . Smokeless tobacco: Current User  Substance Use Topics  . Alcohol use: No  . Drug use: No    Review of Systems Per HPI unless specifically indicated above     Objective:    BP (!) 151/71   Pulse 71   Temp (!) 97.3 F (36.3 C) (Oral)   Resp 16   Ht 5\' 4"  (1.626 m)   Wt 184 lb 9.6 oz (83.7 kg)   BMI 31.69 kg/m   Wt Readings from Last 3 Encounters:  03/05/19 184 lb 9.6 oz (83.7 kg)  02/18/19 174 lb (78.9 kg)  01/15/19 179 lb 6.4 oz (81.4 kg)    Physical Exam Vitals signs and nursing note reviewed.  Constitutional:      General: She is not in acute distress.    Appearance: She is well-developed. She is not diaphoretic.     Comments: Well-appearing, comfortable, cooperative  HENT:     Head: Normocephalic and atraumatic.  Eyes:     General:        Right eye: No discharge.        Left eye: No discharge.     Conjunctiva/sclera: Conjunctivae normal.  Cardiovascular:     Rate and Rhythm: Normal rate.  Pulmonary:     Effort: Pulmonary effort is normal.  Musculoskeletal:     Comments: L gluteal/lateral hip - deeper  palpation with distinct trigger point tenderness, seems more closely approximating trochanteric bursa location today  Skin:    General: Skin is warm and dry.     Findings: No erythema or rash.  Neurological:     Mental Status: She is alert and oriented to person, place, and time.  Psychiatric:        Behavior: Behavior normal.     Comments: Well groomed, good eye contact, normal speech and thoughts       ________________________________________________________ PROCEDURE NOTE Date: 03/05/19 LEFT Gluteal / Hip, lateral Trigger point injection x 1 Discussed benefits and risks (including pain, bleeding, infection, steroid flare). Verbal consent given by patient. Medication:Total 2cc Lidocaine 1% without epi - 2 cc per trigger point approx Time Out taken  Landmarks identifiedindividually the spot located with  palpable muscle knot and localized tenderness. Area cleansed with alcohol wipes, cold spray used for superficial anesthetic.Using 25gauge 1.5 inchneedle, medicine was injected in a wheel fashion in the area.No bandage applied per request.Patient tolerated procedure well without bleeding or paresthesias.No complications    Results for orders placed or performed in visit on 08/01/17  COMPLETE METABOLIC PANEL WITH GFR  Result Value Ref Range   Glucose, Bld 97 65 - 99 mg/dL   BUN 17 7 - 25 mg/dL   Creat 1.07 (H) 0.50 - 0.99 mg/dL   GFR, Est Non African American 56 (L) > OR = 60 mL/min/1.92m2   GFR, Est African American 65 > OR = 60 mL/min/1.14m2   BUN/Creatinine Ratio 16 6 - 22 (calc)   Sodium 136 135 - 146 mmol/L   Potassium 4.4 3.5 - 5.3 mmol/L   Chloride 106 98 - 110 mmol/L   CO2 23 20 - 32 mmol/L   Calcium 9.7 8.6 - 10.4 mg/dL   Total Protein 6.9 6.1 - 8.1 g/dL   Albumin 4.0 3.6 - 5.1 g/dL   Globulin 2.9 1.9 - 3.7 g/dL (calc)   AG Ratio 1.4 1.0 - 2.5 (calc)   Total Bilirubin 0.5 0.2 - 1.2 mg/dL   Alkaline phosphatase (APISO) 99 33 - 130 U/L   AST 13 10 - 35 U/L   ALT 10 6 - 29 U/L  Magnesium  Result Value Ref Range   Magnesium 1.9 1.5 - 2.5 mg/dL  Phosphorus  Result Value Ref Range   Phosphorus 4.2 2.5 - 4.5 mg/dL      Assessment & Plan:   Problem List Items Addressed This Visit    Fibromyalgia (Chronic)   Chronic pain syndrome - Primary (Chronic)   Chronic hip pain (Location of Secondary source of pain) (Bilateral) (L>R) (Chronic)    Other Visit Diagnoses    Trigger point of extremity       Relevant Medications   lidocaine (PF) (XYLOCAINE) 1 % injection 2 mL (Completed)      Clinically with chronic pain syndrome, multiple pain generators w/ fibromyalgia / MSK pain Followed by Pain Management, previously ortho, and current PCP Recent flare of MSK pain and muscle spasm with trigger point at one location Left hip, chronic recurrent area  Plan - Performed  trigger point injections x1 today see procedure note, tolerated well with good results after injections - Continue current management - Recommend return within 4-6 weeks for re-evaluation, repeat injections if progress or new trigger points  Future consider PT referral / other med options   Meds ordered this encounter  Medications  . lidocaine (PF) (XYLOCAINE) 1 % injection 2 mL      Follow up  plan: Return in about 3 months (around 06/05/2019), or if symptoms worsen or fail to improve.   Nobie Putnam, DO [facility] 03/05/2019, 8:39 AM

## 2019-03-05 NOTE — Patient Instructions (Addendum)
Thank you for coming to the office today.  You received left hip trigger point muscle injection today - Lidocaine numbing medicine may ease the pain initially for a few hours until it wears off - the goal as you know is the relax the muscle tension areas - Try to take it easy for next day, avoid over activity and strain on joint - Recommend the following:   - For pain in future may use heating pad or moist heat as needed  If you develop unusual redness worse pain, swelling of skin or drainage of pus, fever or chills - there is a risk that due to injection may get superficial skin infection, please return or seek care immediately at hospital or urgent care if this happens.  Please schedule a Follow-up Appointment to: Return in about 3 months (around 06/05/2019), or if symptoms worsen or fail to improve.  If you have any other questions or concerns, please feel free to call the office or send a message through [place]. You may also schedule an earlier appointment if necessary.  Additionally, you may be receiving a survey about your experience at our office within a few days to 1 week by e-mail or mail. We value your feedback.  Nobie Putnam, DO [place]

## 2019-03-13 ENCOUNTER — Ambulatory Visit (INDEPENDENT_AMBULATORY_CARE_PROVIDER_SITE_OTHER): Payer: PPO

## 2019-03-13 DIAGNOSIS — I10 Essential (primary) hypertension: Secondary | ICD-10-CM | POA: Diagnosis not present

## 2019-03-13 DIAGNOSIS — F3289 Other specified depressive episodes: Secondary | ICD-10-CM

## 2019-03-13 DIAGNOSIS — G894 Chronic pain syndrome: Secondary | ICD-10-CM

## 2019-03-13 DIAGNOSIS — M797 Fibromyalgia: Secondary | ICD-10-CM

## 2019-03-13 NOTE — Patient Instructions (Signed)
Thank you allowing the Chronic Care Management Team to be a part of your care! It was a pleasure speaking with you today!   CCM (Chronic Care Management) Team   Jamiah Recore RN, BSN Nurse Care Coordinator  [phone]  Harlow Asa PharmD  Clinical Pharmacist  [phone]  Eula Fried LCSW Clinical Social Worker [phone]  Goals Addressed            This Visit's Progress   . RN-Inital CCM Care Plan (pt-stated)       Current Barriers:  . Chronic Disease Management support, education, and care coordination needs related to HTN, Depression, and Fibromyalgia   Clinical Goal(s) related to HTN, Depression, and chronic Pain  :  Over the next 120 days, patient will:  . Work with the care management team to address educational, disease management, and care coordination needs  . Call provider office for new or worsened signs and symptoms New or worsened symptom related to Chronic Pain . Call care management team with questions or concerns . Verbalize basic understanding of patient centered plan of care established today  Interventions related to HTN, Depression, and Chronic Pain  :  . Evaluation of current treatment plans and patient's adherence to plan as established by provider . Assessed patient understanding of disease states . Assessed patient's education and care coordination needs . Provided disease specific education to patient  . Collaborated with appropriate clinical care team members regarding patient needs . Referral to CCM LCSW for patient reports of this being a difficult time of year for her related to grief, low social support and struggle with depression   Patient Self Care Activities related to HTN, Depression, and Chronic pain :  . Patient would benefit from support to  self-manage chronic health conditions  Initial goal documentation        The patient verbalized understanding of instructions provided today and declined a print copy of patient  instruction materials.   The patient has been provided with contact information for the care management team and has been advised to call with any health related questions or concerns.

## 2019-03-13 NOTE — Chronic Care Management (AMB) (Signed)
  Chronic Care Management   Initial Visit Note  03/13/2019 Name: April Price MRN: [id number] DOB: 07/26/1955  Referred by: Mikey College, NP (Inactive) Reason for referral : Chronic Care Management (Intro to CCM )   April Price is a 63 y.o. year old female who is a primary care patient of Mikey College, NP (Inactive). The CCM team was consulted for assistance with chronic disease management and care coordination needs.   Review of patient status, including review of consultants reports, relevant laboratory and other test results, and collaboration with appropriate care team members and the patient's provider was performed as part of comprehensive patient evaluation and provision of chronic care management services.    SDOH (Social Determinants of Health) screening performed today. See Care Plan Entry related to challenges with: Depression   Social Connections Physical Activity  Objective:   Goals Addressed            This Visit's Progress   . RN-Inital CCM Care Plan (pt-stated)       Current Barriers:  . Chronic Disease Management support, education, and care coordination needs related to HTN, Depression, and Fibromyalgia   Clinical Goal(s) related to HTN, Depression, and chronic Pain  :  Over the next 120 days, patient will:  . Work with the care management team to address educational, disease management, and care coordination needs  . Call provider office for new or worsened signs and symptoms New or worsened symptom related to Chronic Pain . Call care management team with questions or concerns . Verbalize basic understanding of patient centered plan of care established today  Interventions related to HTN, Depression, and Chronic Pain  :  . Evaluation of current treatment plans and patient's adherence to plan as established by provider . Assessed patient understanding of disease states . Assessed patient's education and care coordination  needs . Provided disease specific education to patient  . Collaborated with appropriate clinical care team members regarding patient needs . Referral to CCM LCSW for patient reports of this being a difficult time of year for her related to grief, low social support and struggle with depression   Patient Self Care Activities related to HTN, Depression, and Chronic pain :  . Patient would benefit from support to  self-manage chronic health conditions  Initial goal documentation         Plan:   The care management team will reach out to the patient again over the next 30  days.  The patient has been provided with contact information for the care management team and has been advised to call with any health related questions or concerns.    Merlene Morse April Absher RN, BSN Nurse Case Pharmacist, [facility]  [phone]) [employer]

## 2019-03-26 ENCOUNTER — Ambulatory Visit: Payer: PPO | Admitting: Family Medicine

## 2019-04-01 ENCOUNTER — Other Ambulatory Visit: Payer: Self-pay

## 2019-04-01 ENCOUNTER — Encounter: Payer: Self-pay | Admitting: Family Medicine

## 2019-04-01 ENCOUNTER — Ambulatory Visit (INDEPENDENT_AMBULATORY_CARE_PROVIDER_SITE_OTHER): Payer: PPO | Admitting: Family Medicine

## 2019-04-01 VITALS — BP 158/61 | HR 89 | Temp 97.1°F | Ht 64.0 in | Wt 183.0 lb

## 2019-04-01 DIAGNOSIS — M797 Fibromyalgia: Secondary | ICD-10-CM | POA: Diagnosis not present

## 2019-04-01 DIAGNOSIS — M25559 Pain in unspecified hip: Secondary | ICD-10-CM | POA: Diagnosis not present

## 2019-04-01 DIAGNOSIS — G8929 Other chronic pain: Secondary | ICD-10-CM | POA: Diagnosis not present

## 2019-04-01 DIAGNOSIS — M79609 Pain in unspecified limb: Secondary | ICD-10-CM | POA: Diagnosis not present

## 2019-04-01 DIAGNOSIS — G894 Chronic pain syndrome: Secondary | ICD-10-CM | POA: Diagnosis not present

## 2019-04-01 MED ORDER — LIDOCAINE HCL (PF) 1 % IJ SOLN
2.0000 mL | Freq: Once | INTRAMUSCULAR | Status: AC
  Administered 2019-04-01: 2 mL

## 2019-04-01 NOTE — Progress Notes (Signed)
Subjective:    Patient ID: April Price, female    DOB: 1955/10/25, 63 y.o.   MRN: [id number]  April Price is a 63 y.o. female presenting on 04/01/2019 for Pain (chronic bilateral hips pain. mostly the left leg. )   HPI   Chronic Pain Syndrome / Left Hip Pain  - Last visit with me 03/05/19 for same trigger point injection, treated with trigger point inj with lidocaine and not steroid, see prior notes for background information. - Interval update with now her pain has gradually increased - last injection wore off 1 week ago approx, now some increase pain worse with ambulation.  - Requesting one trigger point injection again today, same as last time. - history of fibromyalgia contributing - admits muscle spasms as a result of this trigger point Additional history - Followed by Pain Management [facility] / has had extensive therapy in past including joint steroid injections, trigger point injections, PT. -She goes to pain management once yearly - Currently taking Cymbalta 60mg  daily, Tizanidine 4mg  as needed - limited results - No recent injury or trauma or worsening pain - Denies any fevers chills, nausea vomiting, skin rash redness, edema, any new numbness tingling weakness   Depression screen [facility] 2/9 03/05/2019 02/18/2019 12/11/2018  Decreased Interest 0 0 0  Down, Depressed, Hopeless 0 0 1  PHQ - 2 Score 0 0 1  Altered sleeping - - 0  Tired, decreased energy - - 0  Change in appetite - - 0  Feeling bad or failure about yourself  - - 0  Trouble concentrating - - 0  Moving slowly or fidgety/restless - - 0  Suicidal thoughts - - 0  PHQ-9 Score - - 1  Difficult doing work/chores - - Somewhat difficult  Some recent data might be hidden    Social History   Tobacco Use  . Smoking status: Current Every Day Smoker    Packs/day: 0.25    Years: 40.00    Pack years: 10.00    Types: Cigarettes  . Smokeless tobacco: Never Used  Substance Use  Topics  . Alcohol use: No  . Drug use: No    Review of Systems Per HPI unless specifically indicated above     Objective:    BP (!) 158/61 (BP Location: Right Arm, Patient Position: Sitting, Cuff Size: Normal)   Pulse 89   Temp (!) 97.1 F (36.2 C) (Oral)   Ht 5\' 4"  (1.626 m)   Wt 183 lb (83 kg)   BMI 31.41 kg/m   Wt Readings from Last 3 Encounters:  04/01/19 183 lb (83 kg)  03/05/19 184 lb 9.6 oz (83.7 kg)  02/18/19 174 lb (78.9 kg)    Physical Exam Vitals and nursing note reviewed.  Constitutional:      General: She is not in acute distress.    Appearance: She is well-developed. She is not diaphoretic.     Comments: Well-appearing, comfortable, cooperative  HENT:     Head: Normocephalic and atraumatic.  Eyes:     General:        Right eye: No discharge.        Left eye: No discharge.     Conjunctiva/sclera: Conjunctivae normal.  Cardiovascular:     Rate and Rhythm: Normal rate.  Pulmonary:     Effort: Pulmonary effort is normal.  Musculoskeletal:     Comments: L gluteal/lateral hip - deeper palpation with distinct trigger point tenderness, seems more closely approximating trochanteric bursa location  today  Skin:    General: Skin is warm and dry.     Findings: No erythema or rash.  Neurological:     Mental Status: She is alert and oriented to person, place, and time.  Psychiatric:        Behavior: Behavior normal.     Comments: Well groomed, good eye contact, normal speech and thoughts     ________________________________________________________ PROCEDURE NOTE Date: 04/01/19 LEFT Gluteal / Hip, lateral Trigger point injection x 1 Discussed benefits and risks (including pain, bleeding, infection, steroid flare). Verbal consent given by patient. Medication:Total2cc Lidocaine 1% without epi - 2 cc per trigger point approx Time Out taken  Landmarks identifiedindividually the spot located with palpable muscle knot and localized tenderness. Area cleansed  with alcohol wipes, cold spray used for superficial anesthetic.Using 25gauge 1.5 inchneedle, medicine was injected in a wheel fashion in the area. Also one location superior received < 1 ml repeat injection.No bandage applied per request.Patient tolerated procedure well without bleeding or paresthesias.No complications  Results for orders placed or performed in visit on 08/01/17  COMPLETE METABOLIC PANEL WITH GFR  Result Value Ref Range   Glucose, Bld 97 65 - 99 mg/dL   BUN 17 7 - 25 mg/dL   Creat 1.07 (H) 0.50 - 0.99 mg/dL   GFR, Est Non African American 56 (L) > OR = 60 mL/min/1.48m2   GFR, Est African American 65 > OR = 60 mL/min/1.73m2   BUN/Creatinine Ratio 16 6 - 22 (calc)   Sodium 136 135 - 146 mmol/L   Potassium 4.4 3.5 - 5.3 mmol/L   Chloride 106 98 - 110 mmol/L   CO2 23 20 - 32 mmol/L   Calcium 9.7 8.6 - 10.4 mg/dL   Total Protein 6.9 6.1 - 8.1 g/dL   Albumin 4.0 3.6 - 5.1 g/dL   Globulin 2.9 1.9 - 3.7 g/dL (calc)   AG Ratio 1.4 1.0 - 2.5 (calc)   Total Bilirubin 0.5 0.2 - 1.2 mg/dL   Alkaline phosphatase (APISO) 99 33 - 130 U/L   AST 13 10 - 35 U/L   ALT 10 6 - 29 U/L  Magnesium  Result Value Ref Range   Magnesium 1.9 1.5 - 2.5 mg/dL  Phosphorus  Result Value Ref Range   Phosphorus 4.2 2.5 - 4.5 mg/dL      Assessment & Plan:   Problem List Items Addressed This Visit    Fibromyalgia (Chronic)   Relevant Medications   IBU 800 MG tablet   Chronic pain syndrome - Primary (Chronic)   Relevant Medications   IBU 800 MG tablet   Chronic hip pain (Location of Secondary source of pain) (Bilateral) (L>R) (Chronic)   Relevant Medications   IBU 800 MG tablet    Other Visit Diagnoses    Trigger point of extremity       Relevant Medications   lidocaine (PF) (XYLOCAINE) 1 % injection 2 mL (Completed)      Clinically with chronic pain syndrome, multiple pain generators w/ fibromyalgia / MSK pain Followed by Pain Management, previously ortho, and current PCP Recent  flare of MSK pain and muscle spasm with trigger point at one location Left hip, chronic recurrent area  Plan - Performed trigger point injectionx1 today see procedure note, tolerated well with good results after injections - Continue current management - Recommend return within 4-6 weeks for re-evaluation, repeat injections if progress or new trigger points  Future consider PT referral / other med options   Meds ordered  this encounter  Medications  . lidocaine (PF) (XYLOCAINE) 1 % injection 2 mL      Follow up plan: Return in about 4 weeks (around 04/29/2019), or if symptoms worsen or fail to improve, for hip pain.   Nobie Putnam, DO [facility] 04/01/2019, 8:45 AM

## 2019-04-01 NOTE — Patient Instructions (Signed)
Thank you for coming to the office today.  You received left hip trigger point muscle injection today - Lidocaine numbing medicine may ease the pain initially for a few hours until it wears off - the goal as you know is the relax the muscle tension areas - Try to take it easy for next day, avoid over activity and strain on joint - Recommend the following: - For pain in future may use heating pad or moist heat as needed  If you develop unusual redness worse pain, swelling of skin or drainage of pus, fever or chills - there is a risk that due to injection may get superficial skin infection, please return or seek care immediately at hospital or urgent care if this happens.  Please schedule a Follow-up Appointment to: No follow-ups on file.  If you have any other questions or concerns, please feel free to call the office or send a message through [place]. You may also schedule an earlier appointment if necessary.  Additionally, you may be receiving a survey about your experience at our office within a few days to 1 week by e-mail or mail. We value your feedback.  Nobie Putnam, DO [place]

## 2019-04-22 ENCOUNTER — Other Ambulatory Visit: Payer: Self-pay | Admitting: Nurse Practitioner

## 2019-04-22 DIAGNOSIS — F3289 Other specified depressive episodes: Secondary | ICD-10-CM

## 2019-04-22 DIAGNOSIS — G894 Chronic pain syndrome: Secondary | ICD-10-CM

## 2019-04-24 ENCOUNTER — Telehealth: Payer: Self-pay

## 2019-04-29 ENCOUNTER — Other Ambulatory Visit: Payer: Self-pay | Admitting: Family Medicine

## 2019-04-29 ENCOUNTER — Other Ambulatory Visit: Payer: Self-pay

## 2019-04-29 ENCOUNTER — Ambulatory Visit (INDEPENDENT_AMBULATORY_CARE_PROVIDER_SITE_OTHER): Payer: PPO | Admitting: Family Medicine

## 2019-04-29 ENCOUNTER — Encounter: Payer: Self-pay | Admitting: Family Medicine

## 2019-04-29 VITALS — BP 152/66 | HR 91 | Temp 98.6°F | Resp 16 | Ht 64.0 in | Wt 176.0 lb

## 2019-04-29 DIAGNOSIS — G8929 Other chronic pain: Secondary | ICD-10-CM

## 2019-04-29 DIAGNOSIS — M25559 Pain in unspecified hip: Secondary | ICD-10-CM

## 2019-04-29 DIAGNOSIS — M797 Fibromyalgia: Secondary | ICD-10-CM

## 2019-04-29 DIAGNOSIS — M79609 Pain in unspecified limb: Secondary | ICD-10-CM | POA: Diagnosis not present

## 2019-04-29 DIAGNOSIS — G894 Chronic pain syndrome: Secondary | ICD-10-CM

## 2019-04-29 DIAGNOSIS — F3289 Other specified depressive episodes: Secondary | ICD-10-CM

## 2019-04-29 MED ORDER — LIDOCAINE HCL (PF) 1 % IJ SOLN
4.0000 mL | Freq: Once | INTRAMUSCULAR | Status: AC
  Administered 2019-04-29: 4 mL

## 2019-04-29 MED ORDER — BUSPIRONE HCL 10 MG PO TABS: 10.0000 mg | ORAL_TABLET | Freq: Three times a day (TID) | ORAL | 1 refills | Status: DC

## 2019-04-29 NOTE — Progress Notes (Signed)
Subjective:    Patient ID: April Price, female    DOB: [birth year], 64 y.o.   MRN: [id number]  April Price is a 64 y.o. female presenting on 04/29/2019 for Hip Pain (Left side )   HPI   Chronic Pain Syndrome / Left Hip Pain - Last visit with me on 04/01/19 for same trigger point injection, treated withtrigger point inj with lidocaine and not steroid, see prior notes for background information. - Interval update withnow her pain has gradually increased - last injection was not as effective using 1 injection to provide medicine to 2 locations, some increase pain worse with ambulation.  - Requesting repeat trigger point injection again today, same as last time. - history of fibromyalgia contributing - admits muscle spasms as a result of this trigger point Additional history-Followedby Pain Management [facility] / has had extensive therapy in past including joint steroid injections, trigger point injections, PT. -She goes to pain management once yearly - Currently taking Cymbalta 60mg  daily, Tizanidine 4mg  as needed - limited results - No recent injury or trauma or worsening pain - Denies any fevers chills, nausea vomiting, skin rash redness, edema, any new numbness tingling weakness   Depression screen [facility] 2/9 03/05/2019 02/18/2019 12/11/2018  Decreased Interest 0 0 0  Down, Depressed, Hopeless 0 0 1  PHQ - 2 Score 0 0 1  Altered sleeping - - 0  Tired, decreased energy - - 0  Change in appetite - - 0  Feeling bad or failure about yourself  - - 0  Trouble concentrating - - 0  Moving slowly or fidgety/restless - - 0  Suicidal thoughts - - 0  PHQ-9 Score - - 1  Difficult doing work/chores - - Somewhat difficult  Some recent data might be hidden    Social History   Tobacco Use  . Smoking status: Current Every Day Smoker    Packs/day: 0.25    Years: 40.00    Pack years: 10.00    Types: Cigarettes  . Smokeless tobacco: Current User    Substance Use Topics  . Alcohol use: No  . Drug use: No    Review of Systems Per HPI unless specifically indicated above     Objective:    BP (!) 152/66   Pulse 91   Temp 98.6 F (37 C) (Oral)   Resp 16   Ht 5\' 4"  (1.626 m)   Wt 176 lb (79.8 kg)   BMI 30.21 kg/m   Wt Readings from Last 3 Encounters:  04/29/19 176 lb (79.8 kg)  04/01/19 183 lb (83 kg)  03/05/19 184 lb 9.6 oz (83.7 kg)    Physical Exam Vitals and nursing note reviewed.  Constitutional:      General: She is not in acute distress.    Appearance: She is well-developed. She is not diaphoretic.     Comments: Well-appearing, comfortable, cooperative  HENT:     Head: Normocephalic and atraumatic.  Eyes:     General:        Right eye: No discharge.        Left eye: No discharge.     Conjunctiva/sclera: Conjunctivae normal.  Cardiovascular:     Rate and Rhythm: Normal rate.  Pulmonary:     Effort: Pulmonary effort is normal.  Musculoskeletal:     Comments: L gluteal/lateral hip - deeper palpation with distinct trigger point x2 tenderness and knot  Skin:    General: Skin is warm and dry.  Findings: No erythema or rash.  Neurological:     Mental Status: She is alert and oriented to person, place, and time.  Psychiatric:        Behavior: Behavior normal.     Comments: Well groomed, good eye contact, normal speech and thoughts      ________________________________________________________ PROCEDURE NOTE Date: 04/29/19 LEFT Gluteal / Hip, lateral Trigger point injection x 2 Discussed benefits and risks (including pain, bleeding, infection, steroid flare). Verbal consent given by patient. Medication:Total2cc Lidocaine 1% without epi - 2 cc per trigger point approx Time Out taken  Landmarks identifiedindividuallythe spotlocated with palpable muscle knot and localized tenderness. Area cleansed with alcohol wipes, cold spray used for superficial anesthetic.Using 25gauge 1.5 inchneedle, medicine  was injected in a wheel fashion in the area. Also one location superior received < 1 ml repeat injection.No bandage applied per request.Patient tolerated procedure well without bleeding or paresthesias.No complications  Results for orders placed or performed in visit on 08/01/17  COMPLETE METABOLIC PANEL WITH GFR  Result Value Ref Range   Glucose, Bld 97 65 - 99 mg/dL   BUN 17 7 - 25 mg/dL   Creat 1.07 (H) 0.50 - 0.99 mg/dL   GFR, Est Non African American 56 (L) > OR = 60 mL/min/1.32m2   GFR, Est African American 65 > OR = 60 mL/min/1.9m2   BUN/Creatinine Ratio 16 6 - 22 (calc)   Sodium 136 135 - 146 mmol/L   Potassium 4.4 3.5 - 5.3 mmol/L   Chloride 106 98 - 110 mmol/L   CO2 23 20 - 32 mmol/L   Calcium 9.7 8.6 - 10.4 mg/dL   Total Protein 6.9 6.1 - 8.1 g/dL   Albumin 4.0 3.6 - 5.1 g/dL   Globulin 2.9 1.9 - 3.7 g/dL (calc)   AG Ratio 1.4 1.0 - 2.5 (calc)   Total Bilirubin 0.5 0.2 - 1.2 mg/dL   Alkaline phosphatase (APISO) 99 33 - 130 U/L   AST 13 10 - 35 U/L   ALT 10 6 - 29 U/L  Magnesium  Result Value Ref Range   Magnesium 1.9 1.5 - 2.5 mg/dL  Phosphorus  Result Value Ref Range   Phosphorus 4.2 2.5 - 4.5 mg/dL      Assessment & Plan:   Problem List Items Addressed This Visit    Fibromyalgia (Chronic)   Chronic pain syndrome - Primary (Chronic)   Chronic hip pain (Location of Secondary source of pain) (Bilateral) (L>R) (Chronic)    Other Visit Diagnoses    Trigger point of extremity       Relevant Medications   lidocaine (PF) (XYLOCAINE) 1 % injection 4 mL (Completed)      Clinically with chronic pain syndrome, multiple pain generators w/ fibromyalgia / MSK pain Followed by Pain Management, previously ortho, and current PCP Recent flare of MSK pain and muscle spasm with trigger pointat one location Left hip, chronic recurrent area  Plan - Performed trigger point injectionx2today see procedure note, tolerated well with good results after injections - Continue  current management - Recommend return within 4-6 weeks for re-evaluation, repeat injections if progress or new trigger points  Future consider PT referral / other med options   Meds ordered this encounter  Medications  . lidocaine (PF) (XYLOCAINE) 1 % injection 4 mL     Follow up plan: Return in about 4 weeks (around 05/27/2019) for Trigger point injection.   Nobie Putnam, [facility] 04/29/2019, 8:30 AM

## 2019-04-29 NOTE — Patient Instructions (Addendum)
Thank you for coming to the office today.  Thank you for coming to the office today.  You received left hip trigger point muscle injection today - Lidocaine numbing medicine may ease the pain initially for a few hours until it wears off - the goal as you know is the relax the muscle tension areas - Try to take it easy for next day, avoid over activity and strain on joint - Recommend the following: - For pain in future may use heating pad or moist heat as needed  If you develop unusual redness worse pain, swelling of skin or drainage of pus, fever or chills - there is a risk that due to injection may get superficial skin infection, please return or seek care immediately at hospital or urgent care if this happens.  Please schedule a Follow-up Appointment to: Return in about 4 weeks (around 05/27/2019) for Trigger point injection.  If you have any other questions or concerns, please feel free to call the office or send a message through [place]. You may also schedule an earlier appointment if necessary.  Additionally, you may be receiving a survey about your experience at our office within a few days to 1 week by e-mail or mail. We value your feedback.  Nobie Putnam, DO [place]  Please schedule a Follow-up Appointment to: Return in about 4 weeks (around 05/27/2019) for Trigger point injection.  If you have any other questions or concerns, please feel free to call the office or send a message through [place]. You may also schedule an earlier appointment if necessary.  Additionally, you may be receiving a survey about your experience at our office within a few days to 1 week by e-mail or mail. We value your feedback.  Nobie Putnam, DO [place]

## 2019-05-27 ENCOUNTER — Ambulatory Visit (INDEPENDENT_AMBULATORY_CARE_PROVIDER_SITE_OTHER): Payer: PPO

## 2019-05-27 DIAGNOSIS — Z0000 Encounter for general adult medical examination without abnormal findings: Secondary | ICD-10-CM | POA: Diagnosis not present

## 2019-05-27 NOTE — Progress Notes (Signed)
Subjective:   April Price is a 64 y.o. female who presents for Medicare Annual (Subsequent) preventive examination.  Review of Systems:   Cardiac Risk Factors include: advanced age (>33men, >56 women);dyslipidemia;hypertension;smoking/ tobacco exposure     Objective:     Vitals: There were no vitals taken for this visit.  There is no height or weight on file to calculate BMI.  Advanced Directives 05/27/2019 05/21/2018 02/13/2017 12/06/2016 07/25/2016 06/15/2016 06/05/2016  Does Patient Have a Medical Advance Directive? Yes Yes Yes No No No No  Type of Advance Directive Healthcare Power of Attorney Living will;Healthcare Power of [place];Living will - - - (No Data)  Does patient want to make changes to medical advance directive? - - - - - - -  Copy of [place] in Chart? - No - copy requested No - copy requested - - - -  Would patient like information on creating a medical advance directive? - - - - - - No - Patient declined    Tobacco Social History   Tobacco Use  Smoking Status Current Every Day Smoker  . Packs/day: 0.50  . Years: 40.00  . Pack years: 20.00  . Types: Cigarettes  Smokeless Tobacco Never Used     Ready to quit: No Counseling given: Yes   Clinical Intake:  Pre-visit preparation completed: Yes  Pain : 0-10 Pain Score: 4  Pain Type: Chronic pain Pain Location: Generalized Pain Descriptors / Indicators: Aching Pain Onset: More than a month ago Pain Frequency: Constant     Nutritional Risks: None Diabetes: No  How often do you need to have someone help you when you read instructions, pamphlets, or other written materials from your doctor or pharmacy?: 1 - Never  Interpreter Needed?: No  Information entered by :: Chelsa Stout,LPN  Past Medical History:  Diagnosis Date  . Allergy   . Anxiety   . Arthritis   . Asthma   . Cataract    surgery both eyes in 2011  . Chronic fatigue   .  Depression   . Depression screen   . Diffuse cystic mastopathy   . Fibromyalgia   . Fibromyalgia muscle pain   . GERD (gastroesophageal reflux disease)   . Heart murmur   . History of kidney stones   . Hypertension    driven by stress and pain   Past Surgical History:  Procedure Laterality Date  . ABDOMINAL HYSTERECTOMY    . BACK SURGERY     Spinal Fusion  . BREAST BIOPSY Right   . cataract    . CHOLECYSTECTOMY    . COLONOSCOPY  2006  . EYE SURGERY Bilateral 2011   cataract  . FRACTURE SURGERY  2018   left wrist  . NASAL SINUS SURGERY    . NASAL SINUS SURGERY    . OPEN REDUCTION INTERNAL FIXATION (ORIF) DISTAL RADIAL FRACTURE Left 06/05/2016   Procedure: OPEN REDUCTION INTERNAL FIXATION (ORIF) DISTAL RADIAL FRACTURE;  Surgeon: Earnestine Leys, MD;  Location: [facility] ORS;  Service: Orthopedics;  Laterality: Left;  Hand Innovations needed  . [place]  2003, 2012  . TYMPANOSTOMY TUBE PLACEMENT     Family History  Problem Relation Age of Onset  . Heart disease Mother   . Arthritis Mother   . Vision loss Mother   . Heart disease Father   . Stroke Father   . Vascular Disease Sister   . Heart disease Brother    Social History  Socioeconomic History  . Marital status: Single    Spouse name: Not on file  . Number of children: Not on file  . Years of education: Not on file  . Highest education level: Some college, no degree  Occupational History  . Not on file  Tobacco Use  . Smoking status: Current Every Day Smoker    Packs/day: 0.50    Years: 40.00    Pack years: 20.00    Types: Cigarettes  . Smokeless tobacco: Never Used  Substance and Sexual Activity  . Alcohol use: Never  . Drug use: Never  . Sexual activity: Not Currently    Birth control/protection: None  Other Topics Concern  . Not on file  Social History Narrative  . Not on file   Social Determinants of Health   Financial Resource Strain:   . Difficulty of Paying Living Expenses: Not on file    Food Insecurity:   . Worried About Charity fundraiser in the Last Year: Not on file  . Ran Out of Food in the Last Year: Not on file  Transportation Needs:   . Lack of Transportation (Medical): Not on file  . Lack of Transportation (Non-Medical): Not on file  Physical Activity:   . Days of Exercise per Week: Not on file  . Minutes of Exercise per Session: Not on file  Stress:   . Feeling of Stress : Not on file  Social Connections:   . Frequency of Communication with Friends and Family: Not on file  . Frequency of Social Gatherings with Friends and Family: Not on file  . Attends Religious Services: Not on file  . Active Member of Clubs or Organizations: Not on file  . Attends Archivist Meetings: Not on file  . Marital Status: Not on file    Outpatient Encounter Medications as of 05/27/2019  Medication Sig  . albuterol (PROVENTIL HFA;VENTOLIN HFA) 108 (90 Base) MCG/ACT inhaler Inhale 2 puffs into the lungs every 4 (four) hours as needed for wheezing or shortness of breath.  Marland Kitchen azelastine (ASTELIN) 0.1 % nasal spray Place 2 sprays into both nostrils 2 (two) times daily as needed for rhinitis. Use in each nostril as directed  . busPIRone (BUSPAR) 10 MG tablet Take 1 tablet (10 mg total) by mouth 3 (three) times daily.  . cholecalciferol (VITAMIN D) 1000 units tablet Take 1,000 Units by mouth daily.  . DULoxetine (CYMBALTA) 60 MG capsule TAKE 1 CAPSULE EVERY DAY  . esomeprazole (NEXIUM) 40 MG capsule Take 1 capsule (40 mg total) by mouth daily.  . fluticasone (FLONASE) 50 MCG/ACT nasal spray Place 2 sprays into both nostrils daily.  . IBU 800 MG tablet TAKE ONE TABLET 3 TIMES DAILY  . meclizine (ANTIVERT) 25 MG tablet Take 1 tablet (25 mg total) by mouth 3 (three) times daily as needed for dizziness.  . montelukast (SINGULAIR) 10 MG tablet Take 1 tablet (10 mg total) by mouth at bedtime.  . Multiple Vitamins-Minerals (PRESERVISION AREDS 2 PO) Take by mouth.  Marland Kitchen tiZANidine  (ZANAFLEX) 4 MG tablet TAKE ONE TABLET FOUR TIMES DAILY AS NEEDED FOR MUSCLE SPASM  . EPINEPHrine (EPIPEN 2-PAK) 0.3 mg/0.3 mL IJ SOAJ injection Inject 0.3 mLs (0.3 mg total) into the muscle once for 1 dose. (Patient not taking: Reported on 05/21/2018)   No facility-administered encounter medications on file as of 05/27/2019.    Activities of Daily Living In your present state of health, do you have any difficulty performing the following activities:  05/27/2019  Hearing? N  Comment no hearing aids  Vision? N  Comment reading glasses  Difficulty concentrating or making decisions? N  Walking or climbing stairs? Y  Dressing or bathing? N  Doing errands, shopping? N  Preparing Food and eating ? N  Using the Toilet? N  In the past six months, have you accidently leaked urine? N  Do you have problems with loss of bowel control? N  Managing your Medications? N  Managing your Finances? N  Housekeeping or managing your Housekeeping? N  Some recent data might be hidden    Patient Care Team: Malfi, Lupita Raider, FNP as PCP - General (Family Medicine) Clydell Hakim, MD as Consulting Physician (Anesthesiology) Minor, Dalbert Garnet, RN (Inactive) as [place] Management    Assessment:   This is a routine wellness examination for Alivya.  Exercise Activities and Dietary recommendations Current Exercise Habits: Home exercise routine, Type of exercise: yoga(silver sneakers online), Time (Minutes): 30, Frequency (Times/Week): 2, Weekly Exercise (Minutes/Week): 60, Intensity: Mild, Exercise limited by: None identified  Goals    . Increase water intake     Recommend drinking at least 6-7 glasses of water a day     . Quit smoking / using tobacco     Smoking cessation discussed     . RN-Inital CCM Care Plan (pt-stated)     Current Barriers:  . Chronic Disease Management support, education, and care coordination needs related to HTN, Depression, and Fibromyalgia   Clinical Goal(s)  related to HTN, Depression, and chronic Pain  :  Over the next 120 days, patient will:  . Work with the care management team to address educational, disease management, and care coordination needs  . Call provider office for new or worsened signs and symptoms New or worsened symptom related to Chronic Pain . Call care management team with questions or concerns . Verbalize basic understanding of patient centered plan of care established today  Interventions related to HTN, Depression, and Chronic Pain  :  . Evaluation of current treatment plans and patient's adherence to plan as established by provider . Assessed patient understanding of disease states . Assessed patient's education and care coordination needs . Provided disease specific education to patient  . Collaborated with appropriate clinical care team members regarding patient needs . Referral to CCM LCSW for patient reports of this being a difficult time of year for her related to grief, low social support and struggle with depression   Patient Self Care Activities related to HTN, Depression, and Chronic pain :  . Patient would benefit from support to  self-manage chronic health conditions  Initial goal documentation        Fall Risk: Fall Risk  05/27/2019 03/05/2019 02/18/2019 08/29/2018 06/13/2018  Falls in the past year? 0 0 0 0 0  Number falls in past yr: 0 0 0 - 0  Injury with Fall? 0 - - - -  Comment - - - - -  Risk Factor Category  - - - - -  Risk for fall due to : - - - - -  Follow up - Falls evaluation completed - Falls evaluation completed Falls evaluation completed    [place]:  Any stairs in or around the home? Yes  If so, are there any without handrails? No   Home free of loose throw rugs in walkways, pet beds, electrical cords, etc? Yes  Adequate lighting in your home to reduce risk of falls?  Yes   ASSISTIVE DEVICES UTILIZED TO PREVENT FALLS:  Life alert? No  Use of a  cane, walker or w/c? Yes  cane and walker  Grab bars in the bathroom? yes Shower chair or bench in shower? No  Elevated toilet seat or a handicapped toilet? Yes   DME ORDERS:  DME order needed?  No   TIMED UP AND GO:  Unable to perform   Depression Screen PHQ 2/9 Scores 05/27/2019 03/05/2019 02/18/2019 12/11/2018  PHQ - 2 Score 0 0 0 1  PHQ- 9 Score - - - 1  Exception Documentation - - - -     Cognitive Function     6CIT Screen 05/21/2018 02/13/2017  What Year? 0 points 0 points  What month? 0 points 0 points  What time? 0 points 0 points  Count back from 20 0 points 0 points  Months in reverse 0 points 0 points  Repeat phrase 0 points 0 points  Total Score 0 0    Immunization History  Administered Date(s) Administered  . Influenza Split 02/16/2011, 02/06/2012  . Influenza Whole 04/10/2009  . Influenza,inj,Quad PF,6+ Mos 03/26/2017, 01/30/2018, 01/15/2019  . PFIZER SARS-COV-2 Vaccination 05/14/2019  . Pneumococcal Polysaccharide-23 04/10/2008    Qualifies for Shingles Vaccine? Yes  Zostavax completed n/a . Due for Shingrix. Education has been provided regarding the importance of this vaccine. Pt has been advised to call insurance company to determine out of pocket expense. Advised may also receive vaccine at local pharmacy or Health Dept. Verbalized acceptance and understanding.  Tdap: not indicated   Flu Vaccine: up to date   Pneumococcal Vaccine: up to date.   Covid-19: fist dose done on 05/14/2019, scheduled 06/04/2019 for second dose.  Screening Tests Health Maintenance  Topic Date Due  . Fecal DNA (Cologuard)  01/21/2006  . PAP SMEAR-Modifier  09/09/2007  . MAMMOGRAM  12/09/2020  . INFLUENZA VACCINE  Completed  . Hepatitis C Screening  Completed  . HIV Screening  Completed  . TETANUS/TDAP  Discontinued    Cancer Screenings:  Colorectal Screening: Completed cologuard 12/2016 per patient. Unable to find report. Patient states she will complete  12/2019  Mammogram: Completed 12/10/2018. Repeat every year  Bone Density: not indicated   Lung Cancer Screening: (Low Dose CT Chest recommended if Age 18-80 years, 30 pack-year currently smoking OR have quit w/in 15years.) does not qualify.     Additional Screening:  Hepatitis C Screening: does qualify; Completed  Vision Screening: Recommended annual ophthalmology exams for early detection of glaucoma and other disorders of the eye. Is the patient up to date with their annual eye exam?  Yes  Who is the provider or what is the name of the office in which the pt attends annual eye exams? [facility]   Dental Screening: Recommended annual dental exams for proper oral hygiene  Community Resource Referral:  CRR required this visit?  No       Plan:  I have personally reviewed and addressed the Medicare Annual Wellness questionnaire and have noted the following in the patient's chart:  A. Medical and social history B. Use of alcohol, tobacco or illicit drugs  C. Current medications and supplements D. Functional ability and status E.  Nutritional status F.  Physical activity G. Advance directives H. List of other physicians I.  Hospitalizations, surgeries, and ER visits in previous 12 months J.  [place] such as hearing and vision if needed, cognitive and depression L. Referrals and appointments   In  addition, I have reviewed and discussed with patient certain preventive protocols, quality metrics, and best practice recommendations. A written personalized care plan for preventive services as well as general preventive health recommendations were provided to patient.  Signed,    Bevelyn Ngo, LPN  [id number] Nurse Health Advisor   Nurse Notes: none

## 2019-05-27 NOTE — Patient Instructions (Signed)
Ms. April Price , Thank you for taking time to come for your Medicare Wellness Visit. I appreciate your ongoing commitment to your health goals. Please review the following plan we discussed and let me know if I can assist you in the future.   Screening recommendations/referrals: Colonoscopy: cologuard due 12/2019 Mammogram: completed 12/2018 Bone Density: not indicated  Recommended yearly ophthalmology/optometry visit for glaucoma screening and checkup Recommended yearly dental visit for hygiene and checkup  Vaccinations: Influenza vaccine: up to date Pneumococcal vaccine: up to date  Tdap vaccine: not indicated  Shingles vaccine: shingrix eligible     Advanced directives: Please bring a copy of your health care power of attorney and living will to the office at your convenience.  Conditions/risks identified: discussed chronic care management team   Next appointment: Follow up in one year for your annual wellness visit   Preventive Care 40-64 Years, Female Preventive care refers to lifestyle choices and visits with your health care provider that can promote health and wellness. What does preventive care include?  A yearly physical exam. This is also called an annual well check.  Dental exams once or twice a year.  Routine eye exams. Ask your health care provider how often you should have your eyes checked.  Personal lifestyle choices, including:  Daily care of your teeth and gums.  Regular physical activity.  Eating a healthy diet.  Avoiding tobacco and drug use.  Limiting alcohol use.  Practicing safe sex.  Taking low-dose aspirin daily starting at age 23.  Taking vitamin and mineral supplements as recommended by your health care provider. What happens during an annual well check? The services and screenings done by your health care provider during your annual well check will depend on your age, overall health, lifestyle risk factors, and family history of  disease. Counseling  Your health care provider may ask you questions about your:  Alcohol use.  Tobacco use.  Drug use.  Emotional well-being.  Home and relationship well-being.  Sexual activity.  Eating habits.  Work and work Statistician.  Method of birth control.  Menstrual cycle.  Pregnancy history. Screening  You may have the following tests or measurements:  Height, weight, and BMI.  Blood pressure.  Lipid and cholesterol levels. These may be checked every 5 years, or more frequently if you are over 60 years old.  Skin check.  Lung cancer screening. You may have this screening every year starting at age 73 if you have a 30-pack-year history of smoking and currently smoke or have quit within the past 15 years.  Fecal occult blood test (FOBT) of the stool. You may have this test every year starting at age 37.  Flexible sigmoidoscopy or colonoscopy. You may have a sigmoidoscopy every 5 years or a colonoscopy every 10 years starting at age 50.  Hepatitis C blood test.  Hepatitis B blood test.  Sexually transmitted disease (STD) testing.  Diabetes screening. This is done by checking your blood sugar (glucose) after you have not eaten for a while (fasting). You may have this done every 1-3 years.  Mammogram. This may be done every 1-2 years. Talk to your health care provider about when you should start having regular mammograms. This may depend on whether you have a family history of breast cancer.  BRCA-related cancer screening. This may be done if you have a family history of breast, ovarian, tubal, or peritoneal cancers.  Pelvic exam and Pap test. This may be done every 3 years starting at age  21. Starting at age 30, this may be done every 5 years if you have a Pap test in combination with an HPV test.  Bone density scan. This is done to screen for osteoporosis. You may have this scan if you are at high risk for osteoporosis. Discuss your test results,  treatment options, and if necessary, the need for more tests with your health care provider. Vaccines  Your health care provider may recommend certain vaccines, such as:  Influenza vaccine. This is recommended every year.  Tetanus, diphtheria, and acellular pertussis (Tdap, Td) vaccine. You may need a Td booster every 10 years.  Zoster vaccine. You may need this after age 60.  Pneumococcal 13-valent conjugate (PCV13) vaccine. You may need this if you have certain conditions and were not previously vaccinated.  Pneumococcal polysaccharide (PPSV23) vaccine. You may need one or two doses if you smoke cigarettes or if you have certain conditions. Talk to your health care provider about which screenings and vaccines you need and how often you need them. This information is not intended to replace advice given to you by your health care provider. Make sure you discuss any questions you have with your health care provider. Document Released: 04/23/2015 Document Revised: 12/15/2015 Document Reviewed: 01/26/2015 Elsevier Interactive Patient Education  2017 Else[employer] Inc[employer]    Fall Prevention in the Home Falls can cause injuries. They can happen to people of all ages. There are many things you can do to make your home safe and to help prevent falls. What can I do on the outside of my home?  Regularly fix the edges of walkways and driveways and fix any cracks.  Remove anything that might make you trip as you walk through a door, such as a raised step or threshold.  Trim any bushes or trees on the path to your home.  Use bright outdoor lighting.  Clear any walking paths of anything that might make someone trip, such as rocks or tools.  Regularly check to see if handrails are loose or broken. Make sure that both sides of any steps have handrails.  Any raised decks and porches should have guardrails on the edges.  Have any leaves, snow, or ice cleared regularly.  Use sand or salt on walking  paths during winter.  Clean up any spills in your garage right away. This includes oil or grease spills. What can I do in the bathroom?  Use night lights.  Install grab bars by the toilet and in the tub and shower. Do not use towel bars as grab bars.  Use non-skid mats or decals in the tub or shower.  If you need to sit down in the shower, use a plastic, non-slip stool.  Keep the floor dry. Clean up any water that spills on the floor as soon as it happens.  Remove soap buildup in the tub or shower regularly.  Attach bath mats securely with double-sided non-slip rug tape.  Do not have throw rugs and other things on the floor that can make you trip. What can I do in the bedroom?  Use night lights.  Make sure that you have a light by your bed that is easy to reach.  Do not use any sheets or blankets that are too big for your bed. They should not hang down onto the floor.  Have a firm chair that has side arms. You can use this for support while you get dressed.  Do not have throw rugs and other things on   the floor that can make you trip. What can I do in the kitchen?  Clean up any spills right away.  Avoid walking on wet floors.  Keep items that you use a lot in easy-to-reach places.  If you need to reach something above you, use a strong step stool that has a grab bar.  Keep electrical cords out of the way.  Do not use floor polish or wax that makes floors slippery. If you must use wax, use non-skid floor wax.  Do not have throw rugs and other things on the floor that can make you trip. What can I do with my stairs?  Do not leave any items on the stairs.  Make sure that there are handrails on both sides of the stairs and use them. Fix handrails that are broken or loose. Make sure that handrails are as long as the stairways.  Check any carpeting to make sure that it is firmly attached to the stairs. Fix any carpet that is loose or worn.  Avoid having throw rugs at  the top or bottom of the stairs. If you do have throw rugs, attach them to the floor with carpet tape.  Make sure that you have a light switch at the top of the stairs and the bottom of the stairs. If you do not have them, ask someone to add them for you. What else can I do to help prevent falls?  Wear shoes that:  Do not have high heels.  Have rubber bottoms.  Are comfortable and fit you well.  Are closed at the toe. Do not wear sandals.  If you use a stepladder:  Make sure that it is fully opened. Do not climb a closed stepladder.  Make sure that both sides of the stepladder are locked into place.  Ask someone to hold it for you, if possible.  Clearly mark and make sure that you can see:  Any grab bars or handrails.  First and last steps.  Where the edge of each step is.  Use tools that help you move around (mobility aids) if they are needed. These include:  Canes.  Walkers.  Scooters.  Crutches.  Turn on the lights when you go into a dark area. Replace any light bulbs as soon as they burn out.  Set up your furniture so you have a clear path. Avoid moving your furniture around.  If any of your floors are uneven, fix them.  If there are any pets around you, be aware of where they are.  Review your medicines with your doctor. Some medicines can make you feel dizzy. This can increase your chance of falling. Ask your doctor what other things that you can do to help prevent falls. This information is not intended to replace advice given to you by your health care provider. Make sure you discuss any questions you have with your health care provider. Document Released: 01/21/2009 Document Revised: 09/02/2015 Document Reviewed: 05/01/2014 Elsevier Interactive Patient Education  2017 [employer].

## 2019-06-05 ENCOUNTER — Ambulatory Visit (INDEPENDENT_AMBULATORY_CARE_PROVIDER_SITE_OTHER): Payer: PPO | Admitting: Licensed Clinical Social Worker

## 2019-06-05 DIAGNOSIS — M797 Fibromyalgia: Secondary | ICD-10-CM

## 2019-06-05 DIAGNOSIS — F3289 Other specified depressive episodes: Secondary | ICD-10-CM | POA: Diagnosis not present

## 2019-06-05 DIAGNOSIS — G894 Chronic pain syndrome: Secondary | ICD-10-CM

## 2019-06-05 DIAGNOSIS — I10 Essential (primary) hypertension: Secondary | ICD-10-CM | POA: Diagnosis not present

## 2019-06-05 NOTE — Chronic Care Management (AMB) (Signed)
Chronic Care Management    Clinical Social Work Follow Up Note  06/05/2019 Name: April Price MRN: [id number] DOB: 1955-05-24  April Price is a 64 y.o. year old female who is a primary care patient of Lorine Bears, Lupita Raider, FNP. The CCM team was consulted for assistance with Mental Health Counseling and Resources.   Review of patient status, including review of consultants reports, other relevant assessments, and collaboration with appropriate care team members and the patient's provider was performed as part of comprehensive patient evaluation and provision of chronic care management services.    SDOH (Social Determinants of Health) assessments performed: Yes    Advanced Directives Status: <no information> See Care Plan for related entries.   Outpatient Encounter Medications as of 06/05/2019  Medication Sig  . albuterol (PROVENTIL HFA;VENTOLIN HFA) 108 (90 Base) MCG/ACT inhaler Inhale 2 puffs into the lungs every 4 (four) hours as needed for wheezing or shortness of breath.  Marland Kitchen azelastine (ASTELIN) 0.1 % nasal spray Place 2 sprays into both nostrils 2 (two) times daily as needed for rhinitis. Use in each nostril as directed  . busPIRone (BUSPAR) 10 MG tablet Take 1 tablet (10 mg total) by mouth 3 (three) times daily.  . cholecalciferol (VITAMIN D) 1000 units tablet Take 1,000 Units by mouth daily.  . DULoxetine (CYMBALTA) 60 MG capsule TAKE 1 CAPSULE EVERY DAY  . EPINEPHrine (EPIPEN 2-PAK) 0.3 mg/0.3 mL IJ SOAJ injection Inject 0.3 mLs (0.3 mg total) into the muscle once for 1 dose. (Patient not taking: Reported on 05/21/2018)  . esomeprazole (NEXIUM) 40 MG capsule Take 1 capsule (40 mg total) by mouth daily.  . fluticasone (FLONASE) 50 MCG/ACT nasal spray Place 2 sprays into both nostrils daily.  . IBU 800 MG tablet TAKE ONE TABLET 3 TIMES DAILY  . meclizine (ANTIVERT) 25 MG tablet Take 1 tablet (25 mg total) by mouth 3 (three) times daily as needed for dizziness.  .  montelukast (SINGULAIR) 10 MG tablet Take 1 tablet (10 mg total) by mouth at bedtime.  . Multiple Vitamins-Minerals (PRESERVISION AREDS 2 PO) Take by mouth.  Marland Kitchen tiZANidine (ZANAFLEX) 4 MG tablet TAKE ONE TABLET FOUR TIMES DAILY AS NEEDED FOR MUSCLE SPASM   No facility-administered encounter medications on file as of 06/05/2019.     Goals Addressed            This Visit's Progress   . "I need help with managing my chronic pain and depression." (pt-stated)       Current Barriers:  . Chronic Mental Health needs related to pain and major depressive disorder . Financial constraints related to managing health care expenses . Limited social support . ADL IADL limitations . Mental Health Concerns  . Limited access to caregiver . Suicidal Ideation/Homicidal Ideation: No  Clinical Social Work Goal(s):  Marland Kitchen Over the next 120 days, patient will work with SW  bi-monthly  by telephone or in person to reduce or manage symptoms related to chronic pain and depression . Over the next 120 days, patient will work with SW to address concerns related to ongoing stress, isolation, pain and depressive symptoms  Interventions: . Patient interviewed and appropriate assessments performed: brief mental health assessment . Patient reports ongoing chronic pain and has not been able to find any relief. Patient is interested in a possible rheumatologist referral.  . Patient completed COVID 2 shot this week.  . Patient interviewed and appropriate assessments performed . Provided patient with information about  . Discussed plans with patient  for ongoing care management follow up and provided patient with direct contact information for care management team . Advised patient to contact CCM Social Worker for urgent mental health needs/case management needs . Assisted patient/caregiver with obtaining information about health plan benefits . A voluntary and extensive discussion about advanced care planning including  explanation and discussion of advanced was undertaken with the patient.  Explanation regarding healthcare proxy and living will was reviewed and packet with forms with explanation of how to fill them out was given.   . Provided education to patient/caregiver about Hospice and/or Palliative Care services .  Marland Kitchen Brief CBT provided throughout entire session. Patient is interested in receiving ongoing therapy from this LCSW until she can find a therapist that accepts her insurance. Patient does not want psychiatric referral.   Patient Self Care Activities:  . Ability for insight . Motivation for treatment . Strong family or social support  Patient Coping Strengths:  . Hopefulness . Self Advocate . Able to Communicate Effectively  Patient Self Care Deficits:  . Lacks social connections  Initial goal documentation      Follow Up Plan: SW will follow up with patient by phone over the next month  Eula Fried, [employer], MSW, [email or web address] Phone: [phone]

## 2019-06-06 ENCOUNTER — Encounter: Payer: Self-pay | Admitting: Family Medicine

## 2019-06-06 ENCOUNTER — Ambulatory Visit (INDEPENDENT_AMBULATORY_CARE_PROVIDER_SITE_OTHER): Payer: PPO | Admitting: Family Medicine

## 2019-06-06 ENCOUNTER — Other Ambulatory Visit: Payer: Self-pay

## 2019-06-06 VITALS — BP 169/79 | HR 80 | Temp 97.5°F | Resp 16 | Ht 64.0 in | Wt 181.0 lb

## 2019-06-06 DIAGNOSIS — M797 Fibromyalgia: Secondary | ICD-10-CM

## 2019-06-06 DIAGNOSIS — M79609 Pain in unspecified limb: Secondary | ICD-10-CM

## 2019-06-06 DIAGNOSIS — M25559 Pain in unspecified hip: Secondary | ICD-10-CM

## 2019-06-06 DIAGNOSIS — G8929 Other chronic pain: Secondary | ICD-10-CM

## 2019-06-06 DIAGNOSIS — G894 Chronic pain syndrome: Secondary | ICD-10-CM | POA: Diagnosis not present

## 2019-06-06 MED ORDER — LIDOCAINE HCL (PF) 1 % IJ SOLN
4.0000 mL | Freq: Once | INTRAMUSCULAR | Status: AC
  Administered 2019-06-06: 4 mL

## 2019-06-06 NOTE — Patient Instructions (Addendum)
Thank you for coming to the office today.  You receivedleft hiptrigger point muscle injection today - Lidocaine numbing medicine may ease the pain initially for a few hours until it wears off - the goal as you know is the relax the muscle tension areas - Try to take it easy for next day, avoid over activity and strain on joint - Recommend the following: - For pain in future may use heating pad or moist heat as needed  If you develop unusual redness worse pain, swelling of skin or drainage of pus, fever or chills - there is a risk that due to injection may get superficial skin infection, please return or seek care immediately at hospital or urgent care if this happens.  Please schedule a Follow-up Appointment to: Return in about 4 weeks (around 07/04/2019), or if symptoms worsen or fail to improve, for trigger point.  If you have any other questions or concerns, please feel free to call the office or send a message through [facility]. You may also schedule an earlier appointment if necessary.  Additionally, you may be receiving a survey about your experience at our office within a few days to 1 week by e-mail or mail. We value your feedback.  Nobie Putnam, DO [place]
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
## 2019-06-09 ENCOUNTER — Telehealth: Payer: Self-pay

## 2019-06-09 ENCOUNTER — Ambulatory Visit: Payer: Self-pay | Admitting: General Practice

## 2019-06-09 NOTE — Chronic Care Management (AMB) (Signed)
  Chronic Care Management   Outreach Note  06/09/2019 Name: April Price MRN: [id number] DOB: Sep 05, 1955  Referred by: Verl Bangs, FNP Reason for referral : Chronic Care Management (Follow up on Pain, HTN, and depression)   An unsuccessful telephone outreach was attempted today. The patient was referred to the case management team for assistance with care management and care coordination.   Follow Up Plan: A HIPPA compliant phone message was left for the patient providing contact information and requesting a return call.   Noreene Larsson RN, MSN, [place] Mobile: [phone]

## 2019-06-20 ENCOUNTER — Encounter: Payer: Self-pay | Admitting: Family Medicine

## 2019-06-26 ENCOUNTER — Telehealth: Payer: Self-pay | Admitting: General Practice

## 2019-06-26 ENCOUNTER — Ambulatory Visit (INDEPENDENT_AMBULATORY_CARE_PROVIDER_SITE_OTHER): Payer: PPO | Admitting: General Practice

## 2019-06-26 DIAGNOSIS — M797 Fibromyalgia: Secondary | ICD-10-CM

## 2019-06-26 DIAGNOSIS — G894 Chronic pain syndrome: Secondary | ICD-10-CM

## 2019-06-26 DIAGNOSIS — F3289 Other specified depressive episodes: Secondary | ICD-10-CM

## 2019-06-26 DIAGNOSIS — I10 Essential (primary) hypertension: Secondary | ICD-10-CM

## 2019-06-26 NOTE — Patient Instructions (Signed)
Visit Information  Goals Addressed            This Visit's Progress   . RN-Inital CCM Care Plan (pt-stated)       Current Barriers:  . Chronic Disease Management support, education, and care coordination needs related to HTN, Depression, Fibromyalgia, and Chronic pain . Financial Barriers . Lack of social support   Clinical Goal(s) related to HTN, Depression, Fibromyalgia, and Chronic pain :  Over the next 120 days, patient will:  . Work with the care management team to address educational, disease management, and care coordination needs  . Call provider office for new or worsened signs and symptoms New or worsened symptom related to Chronic Pain . Call care management team with questions or concerns . Verbalize basic understanding of patient centered plan of care established today  Interventions related to HTN, Depression, Fibromyalgia, and Chronic pain : :  . Evaluation of current treatment plans and patient's adherence to plan as established by provider . Assessed patient understanding of disease states- The patient has a good understanding of how to manage her pain effectively. The patient says heat works the best for her and raining colder days she is in bed most of the day. The patient is not having the best of days today and rates her pain level "all over" at a 7-8.  Her goal is for her pain level to be <5 . Assessed patient's education and care coordination needs: The patient is smoking still. Has tried to quit before. Was successful with Chantix but cost prohibitive- well discuss options with CCM team Pharmacist.  Also will provide educational material by EMMI and My Chart system. Will use the email: [email or web address] . Provided disease specific education to patient- review of Heart Healthy diet.  The patient likes vegetables and does not fry foods . Collaborated with appropriate clinical care team members regarding patient needs . Referral to CCM LCSW for patient reports of this  being a difficult time of year for her related to grief, low social support and struggle with depression- LCSW is currently working with the patient . Referral to CCM pharmacist. The patient was getting medication help at one time but then lost it due to having a part time job. The patient can no longer do her part time job and is interested in reapplying for medication help.   Patient Self Care Activities related to HTN, Depression, Fibromyalgia, and Chronic pain :  . Patient would benefit from support to  self-manage chronic health conditions  Please see past updates related to this goal by clicking on the "Past Updates" button in the selected goal         Patient verbalizes understanding of instructions provided today.   The care management team will reach out to the patient again over the next 60 days.   Noreene Larsson RN, MSN, [facility] Mobile: [phone]  DASH Eating Plan DASH stands for "Dietary Approaches to Stop Hypertension." The DASH eating plan is a healthy eating plan that has been shown to reduce high blood pressure (hypertension). It may also reduce your risk for type 2 diabetes, heart disease, and stroke. The DASH eating plan may also help with weight loss. What are tips for following this plan?  General guidelines  Avoid eating more than 2,300 mg (milligrams) of salt (sodium) a day. If you have hypertension, you may need to reduce your sodium intake to 1,500 mg a day.  Limit alcohol intake to no more than 1 drink a day for nonpregnant women and 2 drinks a day for men. One drink equals 12 oz of beer, 5 oz of wine, or 1 oz of hard liquor.  Work with your health care provider to maintain a healthy body weight or to lose weight. Ask what an ideal weight is for you.  Get at least 30 minutes of exercise that causes your heart to beat faster (aerobic exercise) most days of the week.  Activities may include walking, swimming, or biking.  Work with your health care provider or diet and nutrition specialist (dietitian) to adjust your eating plan to your individual calorie needs. Reading food labels   Check food labels for the amount of sodium per serving. Choose foods with less than 5 percent of the Daily Value of sodium. Generally, foods with less than 300 mg of sodium per serving fit into this eating plan.  To find whole grains, look for the word "whole" as the first word in the ingredient list. Shopping  Buy products labeled as "low-sodium" or "no salt added."  Buy fresh foods. Avoid canned foods and premade or frozen meals. Cooking  Avoid adding salt when cooking. Use salt-free seasonings or herbs instead of table salt or sea salt. Check with your health care provider or pharmacist before using salt substitutes.  Do not fry foods. Cook foods using healthy methods such as baking, boiling, grilling, and broiling instead.  Cook with heart-healthy oils, such as olive, canola, soybean, or sunflower oil. Meal planning  Eat a balanced diet that includes: ? 5 or more servings of fruits and vegetables each day. At each meal, try to fill half of your plate with fruits and vegetables. ? Up to 6-8 servings of whole grains each day. ? Less than 6 oz of lean meat, poultry, or fish each day. A 3-oz serving of meat is about the same size as a deck of cards. One egg equals 1 oz. ? 2 servings of low-fat dairy each day. ? A serving of nuts, seeds, or beans 5 times each week. ? Heart-healthy fats. Healthy fats called Omega-3 fatty acids are found in foods such as flaxseeds and coldwater fish, like sardines, salmon, and mackerel.  Limit how much you eat of the following: ? Canned or prepackaged foods. ? Food that is high in trans fat, such as fried foods. ? Food that is high in saturated fat, such as fatty meat. ? Sweets, desserts, sugary drinks, and other foods with added  sugar. ? Full-fat dairy products.  Do not salt foods before eating.  Try to eat at least 2 vegetarian meals each week.  Eat more home-cooked food and less restaurant, buffet, and fast food.  When eating at a restaurant, ask that your food be prepared with less salt or no salt, if possible. What foods are recommended? The items listed may not be a complete list. Talk with your dietitian about what dietary choices are best for you. Grains Whole-grain or whole-wheat bread. Whole-grain or whole-wheat pasta. Brown rice. [place]. Bulgur. Whole-grain and low-sodium cereals. Pita bread. Low-fat, low-sodium crackers. Whole-wheat flour tortillas. Vegetables Fresh or frozen vegetables (raw, steamed, roasted, or grilled). Low-sodium or reduced-sodium tomato and vegetable juice. Low-sodium or reduced-sodium tomato sauce and tomato paste. Low-sodium or reduced-sodium canned vegetables. Fruits All fresh, dried, or frozen fruit. Canned fruit in natural juice (without added sugar). Meat and other protein foods Skinless chicken or [place]. Ground chicken or [place]. Pork with fat  trimmed off. Fish and seafood. Egg whites. Dried beans, peas, or lentils. Unsalted nuts, nut butters, and seeds. Unsalted canned beans. Lean cuts of beef with fat trimmed off. Low-sodium, lean deli meat. Dairy Low-fat (1%) or fat-free (skim) milk. Fat-free, low-fat, or reduced-fat cheeses. Nonfat, low-sodium ricotta or cottage cheese. Low-fat or nonfat yogurt. Low-fat, low-sodium cheese. Fats and oils Soft margarine without trans fats. Vegetable oil. Low-fat, reduced-fat, or light mayonnaise and salad dressings (reduced-sodium). Canola, safflower, olive, soybean, and sunflower oils. Avocado. Seasoning and other foods Herbs. Spices. Seasoning mixes without salt. Unsalted popcorn and pretzels. Fat-free sweets. What foods are not recommended? The items listed may not be a complete list. Talk with your dietitian about what  dietary choices are best for you. Grains Baked goods made with fat, such as croissants, muffins, or some breads. Dry pasta or rice meal packs. Vegetables Creamed or fried vegetables. Vegetables in a cheese sauce. Regular canned vegetables (not low-sodium or reduced-sodium). Regular canned tomato sauce and paste (not low-sodium or reduced-sodium). Regular tomato and vegetable juice (not low-sodium or reduced-sodium). Angie Fava. Olives. Fruits Canned fruit in a light or heavy syrup. Fried fruit. Fruit in cream or butter sauce. Meat and other protein foods Fatty cuts of meat. Ribs. Fried meat. Berniece Salines. Sausage. Bologna and other processed lunch meats. Salami. Fatback. Hotdogs. Bratwurst. Salted nuts and seeds. Canned beans with added salt. Canned or smoked fish. Whole eggs or egg yolks. Chicken or [place] with skin. Dairy Whole or 2% milk, cream, and half-and-half. Whole or full-fat cream cheese. Whole-fat or sweetened yogurt. Full-fat cheese. Nondairy creamers. Whipped toppings. Processed cheese and cheese spreads. Fats and oils Butter. Stick margarine. Lard. Shortening. Ghee. Bacon fat. Tropical oils, such as coconut, palm kernel, or palm oil. Seasoning and other foods Salted popcorn and pretzels. Onion salt, garlic salt, seasoned salt, table salt, and sea salt. Worcestershire sauce. Tartar sauce. Barbecue sauce. Teriyaki sauce. Soy sauce, including reduced-sodium. Steak sauce. Canned and packaged gravies. Fish sauce. Oyster sauce. Cocktail sauce. Horseradish that you find on the shelf. Ketchup. Mustard. Meat flavorings and tenderizers. Bouillon cubes. Hot sauce and Tabasco sauce. Premade or packaged marinades. Premade or packaged taco seasonings. Relishes. Regular salad dressings. Where to find more information:  National Heart, Lung, and [place]: https://wilson-eaton.com/  American Heart Association: www.heart.org Summary  The DASH eating plan is a healthy eating plan that has been shown to reduce  high blood pressure (hypertension). It may also reduce your risk for type 2 diabetes, heart disease, and stroke.  With the DASH eating plan, you should limit salt (sodium) intake to 2,300 mg a day. If you have hypertension, you may need to reduce your sodium intake to 1,500 mg a day.  When on the DASH eating plan, aim to eat more fresh fruits and vegetables, whole grains, lean proteins, low-fat dairy, and heart-healthy fats.  Work with your health care provider or diet and nutrition specialist (dietitian) to adjust your eating plan to your individual calorie needs. This information is not intended to replace advice given to you by your health care provider. Make sure you discuss any questions you have with your health care provider. Document Revised: 03/09/2017 Document Reviewed: 03/20/2016 Elsevier Patient Education  2020 [employer].  Hypertension, Adult Hypertension is another name for high blood pressure. High blood pressure forces your heart to work harder to pump blood. This can cause problems over time. There are two numbers in a blood pressure reading. There is a top number (systolic) over a bottom number (diastolic).  It is best to have a blood pressure that is below 120/80. Healthy choices can help lower your blood pressure, or you may need medicine to help lower it. What are the causes? The cause of this condition is not known. Some conditions may be related to high blood pressure. What increases the risk?  Smoking.  Having type 2 diabetes mellitus, high cholesterol, or both.  Not getting enough exercise or physical activity.  Being overweight.  Having too much fat, sugar, calories, or salt (sodium) in your diet.  Drinking too much alcohol.  Having long-term (chronic) kidney disease.  Having a family history of high blood pressure.  Age. Risk increases with age.  Race. You may be at higher risk if you are African American.  Gender. Men are at higher risk than women  before age 17. After age 47, women are at higher risk than men.  Having obstructive sleep apnea.  Stress. What are the signs or symptoms?  High blood pressure may not cause symptoms. Very high blood pressure (hypertensive crisis) may cause: ? Headache. ? Feelings of worry or nervousness (anxiety). ? Shortness of breath. ? Nosebleed. ? A feeling of being sick to your stomach (nausea). ? Throwing up (vomiting). ? Changes in how you see. ? Very bad chest pain. ? Seizures. How is this treated?  This condition is treated by making healthy lifestyle changes, such as: ? Eating healthy foods. ? Exercising more. ? Drinking less alcohol.  Your health care provider may prescribe medicine if lifestyle changes are not enough to get your blood pressure under control, and if: ? Your top number is above 130. ? Your bottom number is above 80.  Your personal target blood pressure may vary. Follow these instructions at home: Eating and drinking   If told, follow the DASH eating plan. To follow this plan: ? Fill one half of your plate at each meal with fruits and vegetables. ? Fill one fourth of your plate at each meal with whole grains. Whole grains include whole-wheat pasta, brown rice, and whole-grain bread. ? Eat or drink low-fat dairy products, such as skim milk or low-fat yogurt. ? Fill one fourth of your plate at each meal with low-fat (lean) proteins. Low-fat proteins include fish, chicken without skin, eggs, beans, and tofu. ? Avoid fatty meat, cured and processed meat, or chicken with skin. ? Avoid pre-made or processed food.  Eat less than 1,500 mg of salt each day.  Do not drink alcohol if: ? Your doctor tells you not to drink. ? You are pregnant, may be pregnant, or are planning to become pregnant.  If you drink alcohol: ? Limit how much you use to:  0-1 drink a day for women.  0-2 drinks a day for men. ? Be aware of how much alcohol is in your drink. In the [place] one  drink equals one 12 oz bottle of beer (355 mL), one 5 oz glass of wine (148 mL), or one 1 oz glass of hard liquor (44 mL). Lifestyle   Work with your doctor to stay at a healthy weight or to lose weight. Ask your doctor what the best weight is for you.  Get at least 30 minutes of exercise most days of the week. This may include walking, swimming, or biking.  Get at least 30 minutes of exercise that strengthens your muscles (resistance exercise) at least 3 days a week. This may include lifting weights or doing Pilates.  Do not use any products that contain  nicotine or tobacco, such as cigarettes, e-cigarettes, and chewing tobacco. If you need help quitting, ask your doctor.  Check your blood pressure at home as told by your doctor.  Keep all follow-up visits as told by your doctor. This is important. Medicines  Take over-the-counter and prescription medicines only as told by your doctor. Follow directions carefully.  Do not skip doses of blood pressure medicine. The medicine does not work as well if you skip doses. Skipping doses also puts you at risk for problems.  Ask your doctor about side effects or reactions to medicines that you should watch for. Contact a doctor if you:  Think you are having a reaction to the medicine you are taking.  Have headaches that keep coming back (recurring).  Feel dizzy.  Have swelling in your ankles.  Have trouble with your vision. Get help right away if you:  Get a very bad headache.  Start to feel mixed up (confused).  Feel weak or numb.  Feel faint.  Have very bad pain in your: ? Chest. ? Belly (abdomen).  Throw up more than once.  Have trouble breathing. Summary  Hypertension is another name for high blood pressure.  High blood pressure forces your heart to work harder to pump blood.  For most people, a normal blood pressure is less than 120/80.  Making healthy choices can help lower blood pressure. If your blood pressure  does not get lower with healthy choices, you may need to take medicine. This information is not intended to replace advice given to you by your health care provider. Make sure you discuss any questions you have with your health care provider. Document Revised: 12/05/2017 Document Reviewed: 12/05/2017 Elsevier Patient Education  Ages.  Smoking and Musculoskeletal Health Smoking is bad for your health. Most people know that smoking causes lung disease, heart disease, and cancer. But people may not realize that it also affects their bones, muscles, and joints (musculoskeletal system). When you smoke, the effects on your lungs and heart result in less oxygen for your musculoskeletal system. This can lead to poor bone and joint health. How can smoking affect my musculoskeletal health? Smoking can:  Increase your risk of having weak, thin bones (osteoporosis). Elderly smokers are at higher risk for bone fractures related to osteoporosis.  Decrease the ability of bone-forming cells to make and replace bone (in addition to reducing oxygen and blood flow).  Reduce your body's ability to absorb calcium from your diet. Less calcium means weaker bones.  Interfere with the breakdown of the female hormone estrogen. Smoking lowers estrogen, which is a hormone that helps keep bones strong. Women who smoke may have earlier menopause. Menopause is a risk factor for osteoporosis.  Weaken the tissues that attach bones to muscles (tendons). This can lead to shoulder, back, and other joint injuries.  Increase your risk of rheumatoid arthritis or make the condition worse if you already have it.  Slow down healing and increase your risk of infection and other complications if you have a bone fracture or surgery that involves your musculoskeletal system.  Make you get out of breath easily. This can keep you from getting the exercise you need to keep your bones and joints healthy.  Decrease your  appetite and body mass. You may lose weight and muscle strength. This can put you at higher risk for muscle injury, joint injury, and broken bones. What actions can I take to prevent musculoskeletal problems? Quit smoking  Do not start smoking. Quit if you already do. Even stopping later in life can improve musculoskeletal health.  Do not use any products that contain nicotine or tobacco. Do not replace cigarette smoking with e-cigarettes. The safety of e-cigarettes is not known, and some may contain harmful chemicals.  Make a plan to quit smoking and commit to it. Look for programs to help you, and ask your health care provider for recommendations and ideas.  Talk with your health care provider about using nicotine replacement medicines to help you quit, such as gum, lozenges, patches, sprays, or pills. Make other lifestyle changes   Eat a healthy diet that includes calcium and vitamin D. These nutrients are important for bone health. ? Calcium is found in dairy foods and green leafy vegetables. ? Vitamin D is found in eggs, fish, and liver. ? Many foods also have vitamin D and calcium added to them (are fortified). ? Ask your health care provider if you would benefit from taking a supplement.  Get out in the sunshine for a short time every day. This increases production of vitamin D.  Get 30 minutes of exercise at least 5 days a week. Weight-bearing and strength exercises are best for musculoskeletal health. Ask your health care provider what type of exercise is safe for you.  Do not drink alcohol if: ? Your health care provider tells you not to drink. ? You are pregnant, may be pregnant, or are planning to become pregnant.  If you drink alcohol, limit how much you have: ? 0-1 drink a day for women. ? 0-2 drinks a day for men.  Be aware of how much alcohol is in your drink. In the [place] one drink equals one 12 oz bottle of beer (355 mL), one 5 oz glass of wine (148 mL), or  one 1 oz glass of hard liquor (44 mL). Where to find more information You may find more information about smoking, musculoskeletal health, and quitting smoking from:  [place] [employer]: orthoinfo.aaos.[place], Osteoporosis and Related [place]: bones.SouthExposed.es  HelpGuide.org: helpguide.org  https://hall.com/: smokefree.gov  American Lung Association: lung.org Contact a health care provider if:  You need help to quit smoking. Summary  When you smoke, the effects on your lungs and heart result in less oxygen for your musculoskeletal system.  Even stopping smoking later in life can improve musculoskeletal health.  Do not use any products that contain nicotine or tobacco, such as cigarettes and e-cigarettes.  If you need help quitting, ask your health care provider. This information is not intended to replace advice given to you by your health care provider. Make sure you discuss any questions you have with your health care provider. Document Revised: 12/20/2018 Document Reviewed: 07/23/2017 Elsevier Patient Education  [place].  Steps to Quit Smoking Smoking tobacco is the leading cause of preventable death. It can affect almost every organ in the body. Smoking puts you and people around you at risk for many serious, long-lasting (chronic) diseases. Quitting smoking can be hard, but it is one of the best things that you can do for your health. It is never too late to quit. How do I get ready to quit? When you decide to quit smoking, make a plan to help you succeed. Before you quit:  Pick a date to quit. Set a date within the next 2 weeks to give you time to prepare.  Write down the reasons why you are  quitting. Keep this list in places where you will see it often.  Tell your family, friends, and co-workers that you are quitting. Their support is important.  Talk with your doctor about the choices  that may help you quit.  Find out if your health insurance will pay for these treatments.  Know the people, places, things, and activities that make you want to smoke (triggers). Avoid them. What first steps can I take to quit smoking?  Throw away all cigarettes at home, at work, and in your car.  Throw away the things that you use when you smoke, such as ashtrays and lighters.  Clean your car. Make sure to empty the ashtray.  Clean your home, including curtains and carpets. What can I do to help me quit smoking? Talk with your doctor about taking medicines and seeing a counselor at the same time. You are more likely to succeed when you do both.  If you are pregnant or breastfeeding, talk with your doctor about counseling or other ways to quit smoking. Do not take medicine to help you quit smoking unless your doctor tells you to do so. To quit smoking: Quit right away  Quit smoking totally, instead of slowly cutting back on how much you smoke over a period of time.  Go to counseling. You are more likely to quit if you go to counseling sessions regularly. Take medicine You may take medicines to help you quit. Some medicines need a prescription, and some you can buy over-the-counter. Some medicines may contain a drug called nicotine to replace the nicotine in cigarettes. Medicines may:  Help you to stop having the desire to smoke (cravings).  Help to stop the problems that come when you stop smoking (withdrawal symptoms). Your doctor may ask you to use:  Nicotine patches, gum, or lozenges.  Nicotine inhalers or sprays.  Non-nicotine medicine that is taken by mouth. Find resources Find resources and other ways to help you quit smoking and remain smoke-free after you quit. These resources are most helpful when you use them often. They include:  Online chats with a Social worker.  Phone quitlines.  Printed Furniture conservator/restorer.  Support groups or group counseling.  Text  messaging programs.  Mobile phone apps. Use apps on your mobile phone or tablet that can help you stick to your quit plan. There are many free apps for mobile phones and tablets as well as websites. Examples include Quit Guide from the [employer] and smokefree.gov  What things can I do to make it easier to quit?   Talk to your family and friends. Ask them to support and encourage you.  Call a phone quitline (1-800-QUIT-NOW), reach out to support groups, or work with a Social worker.  Ask people who smoke to not smoke around you.  Avoid places that make you want to smoke, such as: ? Bars. ? Parties. ? Smoke-break areas at work.  Spend time with people who do not smoke.  Lower the stress in your life. Stress can make you want to smoke. Try these things to help your stress: ? Getting regular exercise. ? Doing deep-breathing exercises. ? Doing yoga. ? Meditating. ? Doing a body scan. To do this, close your eyes, focus on one area of your body at a time from head to toe. Notice which parts of your body are tense. Try to relax the muscles in those areas. How will I feel when I quit smoking? Day 1 to 3 weeks Within the first 24 hours, you  may start to have some problems that come from quitting tobacco. These problems are very bad 2-3 days after you quit, but they do not often last for more than 2-3 weeks. You may get these symptoms:  Mood swings.  Feeling restless, nervous, angry, or annoyed.  Trouble concentrating.  Dizziness.  Strong desire for high-sugar foods and nicotine.  Weight gain.  Trouble pooping (constipation).  Feeling like you may vomit (nausea).  Coughing or a sore throat.  Changes in how the medicines that you take for other issues work in your body.  Depression.  Trouble sleeping (insomnia). Week 3 and afterward After the first 2-3 weeks of quitting, you may start to notice more positive results, such as:  Better sense of smell and taste.  Less coughing and  sore throat.  Slower heart rate.  Lower blood pressure.  Clearer skin.  Better breathing.  Fewer sick days. Quitting smoking can be hard. Do not give up if you fail the first time. Some people need to try a few times before they succeed. Do your best to stick to your quit plan, and talk with your doctor if you have any questions or concerns. Summary  Smoking tobacco is the leading cause of preventable death. Quitting smoking can be hard, but it is one of the best things that you can do for your health.  When you decide to quit smoking, make a plan to help you succeed.  Quit smoking right away, not slowly over a period of time.  When you start quitting, seek help from your doctor, family, or friends. This information is not intended to replace advice given to you by your health care provider. Make sure you discuss any questions you have with your health care provider. Document Revised: 12/20/2018 Document Reviewed: 06/15/2018 Elsevier Patient Education  [place].

## 2019-06-26 NOTE — Chronic Care Management (AMB) (Signed)
Chronic Care Management   Follow Up Note   06/26/2019 Name: April Price MRN: [id number] DOB: [birth year]  Referred by: Verl Bangs, FNP Reason for referral : Chronic Care Management (HTN/Pain/Depression)   April Price is a 64 y.o. year old female who is a primary care patient of Verl Bangs, FNP. The CCM team was consulted for assistance with chronic disease management and care coordination needs.    Review of patient status, including review of consultants reports, relevant laboratory and other test results, and collaboration with appropriate care team members and the patient's provider was performed as part of comprehensive patient evaluation and provision of chronic care management services.    SDOH (Social Determinants of Health) assessments performed: Yes See Care Plan activities for detailed interventions related to SDOH)  SDOH Interventions     Most Recent Value  SDOH Interventions  SDOH Interventions for the Following Domains  Tobacco, Physical Activity  Physical Activity Interventions  Other (Comments) [since COVID she has been unable to exercise]  Tobacco Interventions  Other (Comment) [the patient has used Chantix in the past and it helped but she can not afford. Provided smoking cessation information.]  Alcohol Brief Interventions/Follow-up  AUDIT Score <7 follow-up not indicated       Outpatient Encounter Medications as of 06/26/2019  Medication Sig  . albuterol (PROVENTIL HFA;VENTOLIN HFA) 108 (90 Base) MCG/ACT inhaler Inhale 2 puffs into the lungs every 4 (four) hours as needed for wheezing or shortness of breath.  Marland Kitchen azelastine (ASTELIN) 0.1 % nasal spray Place 2 sprays into both nostrils 2 (two) times daily as needed for rhinitis. Use in each nostril as directed  . busPIRone (BUSPAR) 10 MG tablet Take 1 tablet (10 mg total) by mouth 3 (three) times daily.  . cholecalciferol (VITAMIN D) 1000 units tablet Take 1,000 Units by mouth daily.  .  DULoxetine (CYMBALTA) 60 MG capsule TAKE 1 CAPSULE EVERY DAY  . esomeprazole (NEXIUM) 40 MG capsule Take 1 capsule (40 mg total) by mouth daily.  . fluticasone (FLONASE) 50 MCG/ACT nasal spray Place 2 sprays into both nostrils daily.  . IBU 800 MG tablet TAKE ONE TABLET 3 TIMES DAILY  . meclizine (ANTIVERT) 25 MG tablet Take 1 tablet (25 mg total) by mouth 3 (three) times daily as needed for dizziness.  . montelukast (SINGULAIR) 10 MG tablet Take 1 tablet (10 mg total) by mouth at bedtime.  . Multiple Vitamins-Minerals (PRESERVISION AREDS 2 PO) Take by mouth.  Marland Kitchen tiZANidine (ZANAFLEX) 4 MG tablet TAKE ONE TABLET FOUR TIMES DAILY AS NEEDED FOR MUSCLE SPASM  . EPINEPHrine (EPIPEN 2-PAK) 0.3 mg/0.3 mL IJ SOAJ injection Inject 0.3 mLs (0.3 mg total) into the muscle once for 1 dose. (Patient not taking: Reported on 05/21/2018)   No facility-administered encounter medications on file as of 06/26/2019.     Objective:  BP Readings from Last 3 Encounters:  06/06/19 (!) 169/79  04/29/19 (!) 152/66  04/01/19 (!) 158/61    Goals Addressed            This Visit's Progress   . RN-Inital CCM Care Plan (pt-stated)       Current Barriers:  . Chronic Disease Management support, education, and care coordination needs related to HTN, Depression, Fibromyalgia, and Chronic pain . Financial Barriers . Lack of social support   Clinical Goal(s) related to HTN, Depression, Fibromyalgia, and Chronic pain :  Over the next 120 days, patient will:  . Work with the care management team to  address educational, disease management, and care coordination needs  . Call provider office for new or worsened signs and symptoms New or worsened symptom related to Chronic Pain . Call care management team with questions or concerns . Verbalize basic understanding of patient centered plan of care established today  Interventions related to HTN, Depression, Fibromyalgia, and Chronic pain : :  . Evaluation of current  treatment plans and patient's adherence to plan as established by provider . Assessed patient understanding of disease states- The patient has a good understanding of how to manage her pain effectively. The patient says heat works the best for her and raining colder days she is in bed most of the day. The patient is not having the best of days today and rates her pain level "all over" at a 7-8.  Her goal is for her pain level to be <5 . Assessed patient's education and care coordination needs: The patient is smoking still. Has tried to quit before. Was successful with Chantix but cost prohibitive- well discuss options with CCM team Pharmacist.  Also will provide educational material by EMMI and My Chart system. Will use the email: [email or web address] . Provided disease specific education to patient- review of Heart Healthy diet.  The patient likes vegetables and does not fry foods . Collaborated with appropriate clinical care team members regarding patient needs . Referral to CCM LCSW for patient reports of this being a difficult time of year for her related to grief, low social support and struggle with depression- LCSW is currently working with the patient . Referral to CCM pharmacist. The patient was getting medication help at one time but then lost it due to having a part time job. The patient can no longer do her part time job and is interested in reapplying for medication help.   Patient Self Care Activities related to HTN, Depression, Fibromyalgia, and Chronic pain :  . Patient would benefit from support to  self-manage chronic health conditions  Please see past updates related to this goal by clicking on the "Past Updates" button in the selected goal          Plan:   The care management team will reach out to the patient again over the next 60 days.    Noreene Larsson RN, MSN, [facility] Mobile:  [phone]

## 2019-07-04 ENCOUNTER — Other Ambulatory Visit: Payer: Self-pay

## 2019-07-04 ENCOUNTER — Ambulatory Visit (INDEPENDENT_AMBULATORY_CARE_PROVIDER_SITE_OTHER): Payer: PPO | Admitting: Family Medicine

## 2019-07-04 ENCOUNTER — Encounter: Payer: Self-pay | Admitting: Family Medicine

## 2019-07-04 VITALS — BP 144/73 | Temp 97.1°F | Ht 64.0 in | Wt 178.4 lb

## 2019-07-04 DIAGNOSIS — G894 Chronic pain syndrome: Secondary | ICD-10-CM | POA: Diagnosis not present

## 2019-07-04 DIAGNOSIS — M25559 Pain in unspecified hip: Secondary | ICD-10-CM | POA: Diagnosis not present

## 2019-07-04 DIAGNOSIS — G8929 Other chronic pain: Secondary | ICD-10-CM

## 2019-07-04 DIAGNOSIS — I10 Essential (primary) hypertension: Secondary | ICD-10-CM

## 2019-07-04 DIAGNOSIS — M797 Fibromyalgia: Secondary | ICD-10-CM | POA: Diagnosis not present

## 2019-07-04 DIAGNOSIS — R3129 Other microscopic hematuria: Secondary | ICD-10-CM

## 2019-07-04 LAB — POCT URINALYSIS DIPSTICK
Bilirubin, UA: NEGATIVE
Glucose, UA: NEGATIVE
Ketones, UA: NEGATIVE
Nitrite, UA: NEGATIVE
Protein, UA: POSITIVE — AB
Spec Grav, UA: 1.015 (ref 1.010–1.025)
Urobilinogen, UA: 0.2 E.U./dL
pH, UA: 5 (ref 5.0–8.0)

## 2019-07-04 MED ORDER — LIDOCAINE HCL (PF) 1 % IJ SOLN
20.0000 mg | Freq: Once | INTRAMUSCULAR | Status: AC
  Administered 2019-07-04: 20 mg via INTRADERMAL

## 2019-07-04 NOTE — Patient Instructions (Signed)
We have given you two trigger point injections today.  One in your left outer hip and one in your left trapezius/scapular area.  We will plan to see you back in 4 weeks for repeat injections and for lab work.  Try to get exercise a minimum of 30 minutes per day at least 5 days per week as well as  adequate water intake all while measuring blood pressure a few times per week.  Keep a blood pressure log and bring back to clinic at your next visit.  If your readings are consistently over 140/90 to contact our office/send me a MyChart message and we will see you sooner.  Can try DASH and Mediterranean diet options, avoiding processed foods, lowering sodium intake, avoiding pork products, and eating a plant based diet for optimal health.      Mediterranean Diet  Why follow it? Research shows. . Those who follow the Mediterranean diet have a reduced risk of heart disease  . The diet is associated with a reduced incidence of Parkinson's and Alzheimer's diseases . People following the diet may have longer life expectancies and lower rates of chronic diseases  . The Dietary Guidelines for Americans recommends the Mediterranean diet as an eating plan to promote health and prevent disease  What Is the Mediterranean Diet?  . Healthy eating plan based on typical foods and recipes of Mediterranean-style cooking . The diet is primarily a plant based diet; these foods should make up a majority of meals   Starches - Plant based foods should make up a majority of meals - They are an important sources of vitamins, minerals, energy, antioxidants, and fiber - Choose whole grains, foods high in fiber and minimally processed items  - Typical grain sources include wheat, oats, barley, corn, brown rice, bulgar, farro, millet, polenta, couscous  - Various types of beans include chickpeas, lentils, fava beans, black beans, white beans   Fruits  Veggies - Large quantities of antioxidant rich fruits & veggies; 6 or more  servings  - Vegetables can be eaten raw or lightly drizzled with oil and cooked  - Vegetables common to the traditional Mediterranean Diet include: artichokes, arugula, beets, broccoli, brussel sprouts, cabbage, carrots, celery, collard greens, cucumbers, eggplant, kale, leeks, lemons, lettuce, mushrooms, okra, onions, peas, peppers, potatoes, pumpkin, radishes, rutabaga, shallots, spinach, sweet potatoes, turnips, zucchini - Fruits common to the Mediterranean Diet include: apples, apricots, avocados, cherries, clementines, dates, figs, grapefruits, grapes, melons, nectarines, oranges, peaches, pears, pomegranates, strawberries, tangerines  Fats - Replace butter and margarine with healthy oils, such as olive oil, canola oil, and tahini  - Limit nuts to no more than a handful a day  - Nuts include walnuts, almonds, pecans, pistachios, pine nuts  - Limit or avoid candied, honey roasted or heavily salted nuts - Olives are central to the [employer] - can be eaten whole or used in a variety of dishes   Meats Protein - Limiting red meat: no more than a few times a month - When eating red meat: choose lean cuts and keep the portion to the size of deck of cards - Eggs: approx. 0 to 4 times a week  - Fish and lean poultry: at least 2 a week  - Healthy protein sources include, chicken, [place], lean beef, lamb - Increase intake of seafood such as tuna, salmon, trout, mackerel, shrimp, scallops - Avoid or limit high fat processed meats such as sausage and bacon  Dairy - Include moderate amounts of low fat  dairy products  - Focus on healthy dairy such as fat free yogurt, skim milk, low or reduced fat cheese - Limit dairy products higher in fat such as whole or 2% milk, cheese, ice cream  Alcohol - Moderate amounts of red wine is ok  - No more than 5 oz daily for women (all ages) and men older than age 44  - No more than 10 oz of wine daily for men younger than 3  Other - Limit sweets and other  desserts  - Use herbs and spices instead of salt to flavor foods  - Herbs and spices common to the traditional Mediterranean Diet include: basil, bay leaves, chives, cloves, cumin, fennel, garlic, lavender, marjoram, mint, oregano, parsley, pepper, rosemary, sage, savory, sumac, tarragon, thyme   It's not just a diet, it's a lifestyle:  . The Mediterranean diet includes lifestyle factors typical of those in the region  . Foods, drinks and meals are best eaten with others and savored . Daily physical activity is important for overall good health . This could be strenuous exercise like running and aerobics . This could also be more leisurely activities such as walking, housework, yard-work, or taking the stairs . Moderation is the key; a balanced and healthy diet accommodates most foods and drinks . Consider portion sizes and frequency of consumption of certain foods   Meal Ideas & Options:  . Breakfast:  o Whole wheat toast or whole wheat English muffins with peanut butter & hard boiled egg o Steel cut oats topped with apples & cinnamon and skim milk  o Fresh fruit: banana, strawberries, melon, berries, peaches  o Smoothies: strawberries, bananas, greek yogurt, peanut butter o Low fat greek yogurt with blueberries and granola  o Egg white omelet with spinach and mushrooms o Breakfast couscous: whole wheat couscous, apricots, skim milk, cranberries  . Sandwiches:  o Hummus and grilled vegetables (peppers, zucchini, squash) on whole wheat bread   o Grilled chicken on whole wheat pita with lettuce, tomatoes, cucumbers or tzatziki  o Tuna salad on whole wheat bread: tuna salad made with greek yogurt, olives, red peppers, capers, green onions o Garlic rosemary lamb pita: lamb sauted with garlic, rosemary, salt & pepper; add lettuce, cucumber, greek yogurt to pita - flavor with lemon juice and black pepper  . Seafood:  o Mediterranean grilled salmon, seasoned with garlic, basil, parsley, lemon  juice and black pepper o Shrimp, lemon, and spinach whole-grain pasta salad made with low fat greek yogurt  o Seared scallops with lemon orzo  o Seared tuna steaks seasoned salt, pepper, coriander topped with tomato mixture of olives, tomatoes, olive oil, minced garlic, parsley, green onions and cappers  . Meats:  o Herbed greek chicken salad with kalamata olives, cucumber, feta  o Red bell peppers stuffed with spinach, bulgur, lean ground beef (or lentils) & topped with feta   o Kebabs: skewers of chicken, tomatoes, onions, zucchini, squash  o [place] burgers: made with red onions, mint, dill, lemon juice, feta cheese topped with roasted red peppers . Vegetarian o Cucumber salad: cucumbers, artichoke hearts, celery, red onion, feta cheese, tossed in olive oil & lemon juice  o Hummus and whole grain pita points with a greek salad (lettuce, tomato, feta, olives, cucumbers, red onion) o Lentil soup with celery, carrots made with vegetable broth, garlic, salt and pepper  o Tabouli salad: parsley, bulgur, mint, scallions, cucumbers, tomato, radishes, lemon juice, olive oil, salt and pepper.  You will receive a survey after  today's visit either digitally by e-mail or paper by Christmas mail. Your experiences and feedback matter to [place].  Please respond so we know how we are doing as we provide care for you.  Call us with any questions/concerns/needs.  It is my goal to be available to you for your health concerns.  Thanks for choosing me to be a partner in your healthcare needs!  Harlin Rain, FNP-C Family Nurse Practitioner [facility] Phone: [phone]

## 2019-07-04 NOTE — Addendum Note (Signed)
Addended by: Verl Bangs on: 07/04/2019 04:07 PM   Modules accepted: Orders

## 2019-07-04 NOTE — Assessment & Plan Note (Signed)
Clinically with chronic pain syndrome, multiple pain generators w/ fibromyalgia / MSK pain Followed in past by Pain Management, previously ortho, and now current PCP Recent flare of MSK pain and muscle spasm with trigger pointat one location Left hip, chronic recurrent area and left subscapular area  Plan 1) Performed trigger point injectionx2today see procedure note, tolerated well with good results after injections 2) Continue current management 3) Recommend return within 4-6 weeks for re-evaluation, repeat injections if progress or new trigger points 4) Future consider PT referral / other med options

## 2019-07-04 NOTE — Progress Notes (Signed)
Subjective:    Patient ID: April Price, female    DOB: 03-09-1956, 64 y.o.   MRN: [id number]  April Price is a 64 y.o. female presenting on 07/04/2019 for Hip Pain (bilateral )   HPI  Ms. Pilkenton presents to clinic for trigger point lidocaine injections to her left hip and left upper back/trapezius along left scalpula border.  Reports she had injections approximately 1 month ago with some relief in her symptoms.  Denies numbness, tingling, weakness, loss of ROM, sleep disturbances.  Denies any previous reaction with trigger point lidocaine injections.  Denies any other acute concerns today.  Depression screen [facility] 2/9 06/26/2019 05/27/2019 03/05/2019  Decreased Interest 0 0 0  Down, Depressed, Hopeless 0 0 0  PHQ - 2 Score 0 0 0  Altered sleeping - - -  Tired, decreased energy - - -  Change in appetite - - -  Feeling bad or failure about yourself  - - -  Trouble concentrating - - -  Moving slowly or fidgety/restless - - -  Suicidal thoughts - - -  PHQ-9 Score - - -  Difficult doing work/chores - - -  Some recent data might be hidden    Social History   Tobacco Use  . Smoking status: Current Every Day Smoker    Packs/day: 0.50    Years: 40.00    Pack years: 20.00    Types: Cigarettes  . Smokeless tobacco: Never Used  Substance Use Topics  . Alcohol use: Never  . Drug use: Never    Review of Systems  Constitutional: Negative.   HENT: Negative.   Eyes: Negative.   Respiratory: Negative.   Cardiovascular: Negative.   Gastrointestinal: Negative.   Endocrine: Negative.   Genitourinary: Negative.   Musculoskeletal: Positive for arthralgias and myalgias. Negative for back pain, joint swelling, neck pain and neck stiffness.  Skin: Negative.   Allergic/Immunologic: Negative.   Neurological: Negative.   Hematological: Negative.   Psychiatric/Behavioral: Negative.    Per HPI unless specifically indicated above     Objective:    BP (!) 144/73    Temp (!) 97.1 F (36.2 C) (Temporal)   Ht 5\' 4"  (1.626 m)   Wt 178 lb 6.4 oz (80.9 kg)   BMI 30.62 kg/m   Wt Readings from Last 3 Encounters:  07/04/19 178 lb 6.4 oz (80.9 kg)  06/06/19 181 lb (82.1 kg)  04/29/19 176 lb (79.8 kg)    Physical Exam Vitals reviewed.  Constitutional:      General: She is not in acute distress.    Appearance: Normal appearance. She is well-groomed. She is obese. She is not ill-appearing or toxic-appearing.  HENT:     Head: Normocephalic.  Eyes:     General: Lids are normal. Vision grossly intact.        Right eye: No discharge.        Left eye: No discharge.     Extraocular Movements: Extraocular movements intact.     Conjunctiva/sclera: Conjunctivae normal.     Pupils: Pupils are equal, round, and reactive to light.  Cardiovascular:     Pulses: Normal pulses.  Pulmonary:     Effort: Pulmonary effort is normal.  Musculoskeletal:        General: Tenderness present. No swelling.       Back:     Right lower leg: No edema.     Left lower leg: No edema.       Legs:  Comments: Trigger point lidocaine injections as utilized in diagrams above.  Skin:    General: Skin is warm and dry.     Capillary Refill: Capillary refill takes less than 2 seconds.  Neurological:     General: No focal deficit present.     Mental Status: She is alert and oriented to person, place, and time.     Cranial Nerves: No cranial nerve deficit.     Sensory: No sensory deficit.     Motor: No weakness.     Coordination: Coordination normal.  Psychiatric:        Mood and Affect: Mood normal.        Behavior: Behavior normal. Behavior is cooperative.        Thought Content: Thought content normal.        Judgment: Judgment normal.   ________________________________________________________ PROCEDURE NOTE Date: 07/04/2019 Trigger Point injection x 2 Discussed benefits and risks (including pain, bleeding, infection, steroid flare). Verbal consent given by  patient. Medication:  4 cc Lidocaine 1% without epi Time Out taken  Landmarks identified. Area cleansed with alcohol wipes.Using 21 gauge and 1, 1/2 inch needle, left subscapularis injected followed by left outer upper thigh.  The trigger point was identified and confirmed by the patient. The spot was marked. Skin was cleaned with alcohol and betadine. 2cc of 1% lidocaine was injected in a wheel fashion in the area. Procedure repeated for adjacent trigger point. Patient tolerated procedure well.   Results for orders placed or performed in visit on 07/04/19  POCT Urinalysis Dipstick  Result Value Ref Range   Color, UA Yellow    Clarity, UA clear    Glucose, UA Negative Negative   Bilirubin, UA negative    Ketones, UA negative    Spec Grav, UA 1.015 1.010 - 1.025   Blood, UA trace    pH, UA 5.0 5.0 - 8.0   Protein, UA Positive (A) Negative   Urobilinogen, UA 0.2 0.2 or 1.0 E.U./dL   Nitrite, UA negative    Leukocytes, UA Moderate (2+) (A) Negative   Appearance     Odor        Assessment & Plan:   Problem List Items Addressed This Visit      Cardiovascular and Mediastinum   Hypertension - Primary   Relevant Orders   POCT Urinalysis Dipstick (Completed)     Other   Fibromyalgia (Chronic)   Chronic pain syndrome (Chronic)   Chronic hip pain (Location of Secondary source of pain) (Bilateral) (L>R) (Chronic)    Clinically with chronic pain syndrome, multiple pain generators w/ fibromyalgia / MSK pain Followed in past by Pain Management, previously ortho, and now current PCP Recent flare of MSK pain and muscle spasm with trigger pointat one location Left hip, chronic recurrent area and left subscapular area  Plan 1) Performed trigger point injectionx2today see procedure note, tolerated well with good results after injections 2) Continue current management 3) Recommend return within 4-6 weeks for re-evaluation, repeat injections if progress or new trigger points 4) Future  consider PT referral / other med options       Other Visit Diagnoses    Microscopic hematuria       Relevant Orders   Urine Culture   Urinalysis, microscopic only      No orders of the defined types were placed in this encounter.     Follow up plan: Return in about 4 weeks (around 08/01/2019) for Repeat trigger point injections and LABS.   Elmyra Ricks  Youlanda Mighty, [place] [facility] Nurse Practitioner [facility] 07/04/2019, 8:47 AM

## 2019-07-05 LAB — URINE CULTURE
MICRO NUMBER:: 10298515
Result:: NO GROWTH
SPECIMEN QUALITY:: ADEQUATE

## 2019-07-05 LAB — URINALYSIS, MICROSCOPIC ONLY: RBC / HPF: NONE SEEN /HPF (ref 0–2)

## 2019-07-07 ENCOUNTER — Other Ambulatory Visit: Payer: Self-pay | Admitting: Nurse Practitioner

## 2019-07-07 ENCOUNTER — Other Ambulatory Visit: Payer: Self-pay | Admitting: Family Medicine

## 2019-07-07 DIAGNOSIS — G894 Chronic pain syndrome: Secondary | ICD-10-CM

## 2019-07-07 DIAGNOSIS — K219 Gastro-esophageal reflux disease without esophagitis: Secondary | ICD-10-CM

## 2019-07-08 ENCOUNTER — Other Ambulatory Visit: Payer: Self-pay | Admitting: Family Medicine

## 2019-07-08 DIAGNOSIS — G894 Chronic pain syndrome: Secondary | ICD-10-CM

## 2019-07-10 ENCOUNTER — Ambulatory Visit: Payer: Self-pay

## 2019-07-10 NOTE — Chronic Care Management (AMB) (Signed)
  Care Management   Follow Up Note   07/10/2019 Name: April Price MRN: [id number] DOB: [birth year]  Referred by: Verl Bangs, FNP Reason for referral : [name] is a 64 y.o. year old female who is a primary care patient of Verl Bangs, FNP. The care management team was consulted for assistance with care management and care coordination needs.    Review of patient status, including review of consultants reports, relevant laboratory and other test results, and collaboration with appropriate care team members and the patient's provider was performed as part of comprehensive patient evaluation and provision of chronic care management services.    LCSW completed CCM outreach attempt today but was unable to reach patient successfully. A HIPPA compliant voice message was left encouraging patient to return call once available. LCSW rescheduled CCM SW appointment as well.  A HIPPA compliant phone message was left for the patient providing contact information and requesting a return call.   Eula Fried, BSW, MSW, [email or web address] Phone: [phone]

## 2019-07-15 ENCOUNTER — Ambulatory Visit (INDEPENDENT_AMBULATORY_CARE_PROVIDER_SITE_OTHER): Payer: PPO | Admitting: Licensed Clinical Social Worker

## 2019-07-15 DIAGNOSIS — F4323 Adjustment disorder with mixed anxiety and depressed mood: Secondary | ICD-10-CM | POA: Diagnosis not present

## 2019-07-15 DIAGNOSIS — I10 Essential (primary) hypertension: Secondary | ICD-10-CM | POA: Diagnosis not present

## 2019-07-15 DIAGNOSIS — M797 Fibromyalgia: Secondary | ICD-10-CM

## 2019-07-15 DIAGNOSIS — E7800 Pure hypercholesterolemia, unspecified: Secondary | ICD-10-CM | POA: Diagnosis not present

## 2019-07-15 DIAGNOSIS — F321 Major depressive disorder, single episode, moderate: Secondary | ICD-10-CM

## 2019-07-15 DIAGNOSIS — G894 Chronic pain syndrome: Secondary | ICD-10-CM

## 2019-07-15 NOTE — Chronic Care Management (AMB) (Signed)
Chronic Care Management    Clinical Social Work Follow Up Note  07/15/2019 Name: April Price MRN: [id number] DOB: 1956-03-18  April Price is a 64 y.o. year old female who is a primary care patient of Lorine Bears, Lupita Raider, FNP. The CCM team was consulted for assistance with Mental Health Counseling and Resources.   Review of patient status, including review of consultants reports, other relevant assessments, and collaboration with appropriate care team members and the patient's provider was performed as part of comprehensive patient evaluation and provision of chronic care management services.    SDOH (Social Determinants of Health) assessments performed: Yes    Outpatient Encounter Medications as of 07/15/2019  Medication Sig  . albuterol (PROVENTIL HFA;VENTOLIN HFA) 108 (90 Base) MCG/ACT inhaler Inhale 2 puffs into the lungs every 4 (four) hours as needed for wheezing or shortness of breath.  Marland Kitchen azelastine (ASTELIN) 0.1 % nasal spray Place 2 sprays into both nostrils 2 (two) times daily as needed for rhinitis. Use in each nostril as directed  . busPIRone (BUSPAR) 10 MG tablet Take 1 tablet (10 mg total) by mouth 3 (three) times daily.  . cholecalciferol (VITAMIN D) 1000 units tablet Take 1,000 Units by mouth daily.  . DULoxetine (CYMBALTA) 60 MG capsule TAKE 1 CAPSULE EVERY DAY  . EPINEPHrine (EPIPEN 2-PAK) 0.3 mg/0.3 mL IJ SOAJ injection Inject 0.3 mLs (0.3 mg total) into the muscle once for 1 dose. (Patient not taking: Reported on 05/21/2018)  . esomeprazole (NEXIUM) 40 MG capsule TAKE 1 CAPSULE EVERY DAY  . fluticasone (FLONASE) 50 MCG/ACT nasal spray Place 2 sprays into both nostrils daily.  . IBU 800 MG tablet TAKE ONE TABLET 3 TIMES DAILY  . meclizine (ANTIVERT) 25 MG tablet Take 1 tablet (25 mg total) by mouth 3 (three) times daily as needed for dizziness.  . montelukast (SINGULAIR) 10 MG tablet Take 1 tablet (10 mg total) by mouth at bedtime.  . Multiple  Vitamins-Minerals (PRESERVISION AREDS 2 PO) Take by mouth.  Marland Kitchen tiZANidine (ZANAFLEX) 4 MG tablet TAKE ONE TABLET FOUR TIMES DAILY AS NEEDED FOR MUSCLE SPASM   No facility-administered encounter medications on file as of 07/15/2019.     Goals Addressed    . "I need help with managing my chronic pain and depression." (pt-stated)       Current Barriers:  . Chronic Mental Health needs related to pain and major depressive disorder . Financial constraints related to managing health care expenses . Limited social support . ADL IADL limitations . Mental Health Concerns  . Limited access to caregiver . Suicidal Ideation/Homicidal Ideation: No  Clinical Social Work Goal(s):  Marland Kitchen Over the next 120 days, patient will work with SW  bi-monthly  by telephone or in person to reduce or manage symptoms related to chronic pain and depression . Over the next 120 days, patient will work with SW to address concerns related to ongoing stress, isolation, pain and depressive symptoms  Interventions: . Patient interviewed and appropriate assessments performed: brief mental health assessment . Patient reports ongoing chronic pain and has not been able to find any relief. Patient is interested in a possible rheumatologist referral. Patient reports that she met new PCP and received trigger point injections that have been helpful with her pain. . Patient completed both COVID vaccinations.  . Patient interviewed and appropriate assessments performed . Provided patient with information about available mental health support resources within the area . Discussed plans with patient for ongoing care management follow up and  provided patient with direct contact information for care management team . Advised patient to contact CCM Social Worker for urgent mental health needs/case management needs . Assisted patient/caregiver with obtaining information about health plan benefits . A voluntary and extensive discussion about  advanced care planning including explanation and discussion of advanced was undertaken with the patient.  Explanation regarding healthcare proxy and living will was reviewed and packet with forms with explanation of how to fill them out was given.   . Patient admits that she could be more active. Healthy self-care education provided. Patient does not wish to return to the [facility] until COVID has calmed down. . Provided education to patient/caregiver about Hospice and/or Palliative Care services . Brief CBT provided throughout entire session. Patient is interested in receiving ongoing therapy from this LCSW until she can find a therapist that accepts her insurance. Patient does not want psychiatric referral.  . Patient reports that her blood pressure has been high recently and she broke her blood pressure monitor. LCSW will sent DME referral request to CCM RNCM.    Patient Self Care Activities:  . Ability for insight . Motivation for treatment . Strong family or social support  Patient Coping Strengths:  . Hopefulness . Self Advocate . Able to Communicate Effectively  Patient Self Care Deficits:  . Lacks social connections  Please see past updates related to this goal by clicking on the "Past Updates" button in the selected goal      Follow Up Plan: SW will follow up with patient by phone over the next quarter  Eula Fried, [place], MSW, [email or web address] Phone: [phone]

## 2019-07-21 ENCOUNTER — Ambulatory Visit: Payer: Self-pay | Admitting: Pharmacist

## 2019-07-21 NOTE — Chronic Care Management (AMB) (Signed)
  Chronic Care Management   Follow Up Note   07/21/2019 Name: April Price MRN: [id number] DOB: [birth year]  Referred by: Verl Bangs, FNP Reason for referral : Chronic Care Management (Patient Phone Call)   April Price is a 64 y.o. year old female who is a primary care patient of Lorine Bears, Lupita Raider, [place]. The CCM team was consulted for assistance with chronic disease management and care coordination needs.    Receive referral from CCM Nurse Case Manager for medication assistance and information on smoking cessation.  Was unable to reach patient via telephone today and have left HIPAA compliant voicemail asking patient to return my call.   Plan  The care management team will reach out to the patient again over the next 30 days.   Harlow Asa, PharmD, [place] [employer] [phone]

## 2019-07-25 ENCOUNTER — Encounter: Payer: Self-pay | Admitting: Family Medicine

## 2019-07-25 ENCOUNTER — Ambulatory Visit (INDEPENDENT_AMBULATORY_CARE_PROVIDER_SITE_OTHER): Payer: PPO | Admitting: Family Medicine

## 2019-07-25 ENCOUNTER — Other Ambulatory Visit: Payer: Self-pay

## 2019-07-25 VITALS — BP 158/76 | HR 86 | Temp 97.5°F | Ht 64.0 in | Wt 179.6 lb

## 2019-07-25 DIAGNOSIS — G8929 Other chronic pain: Secondary | ICD-10-CM | POA: Diagnosis not present

## 2019-07-25 DIAGNOSIS — E559 Vitamin D deficiency, unspecified: Secondary | ICD-10-CM | POA: Diagnosis not present

## 2019-07-25 DIAGNOSIS — Z1211 Encounter for screening for malignant neoplasm of colon: Secondary | ICD-10-CM

## 2019-07-25 DIAGNOSIS — M25559 Pain in unspecified hip: Secondary | ICD-10-CM

## 2019-07-25 DIAGNOSIS — I10 Essential (primary) hypertension: Secondary | ICD-10-CM

## 2019-07-25 DIAGNOSIS — Z87892 Personal history of anaphylaxis: Secondary | ICD-10-CM | POA: Diagnosis not present

## 2019-07-25 DIAGNOSIS — G894 Chronic pain syndrome: Secondary | ICD-10-CM | POA: Diagnosis not present

## 2019-07-25 DIAGNOSIS — R635 Abnormal weight gain: Secondary | ICD-10-CM

## 2019-07-25 DIAGNOSIS — E7800 Pure hypercholesterolemia, unspecified: Secondary | ICD-10-CM | POA: Diagnosis not present

## 2019-07-25 MED ORDER — EPINEPHRINE 0.3 MG/0.3ML IJ SOAJ: 0.3000 mg | Freq: Once | INTRAMUSCULAR | 0 refills | Status: AC

## 2019-07-25 MED ORDER — LIDOCAINE HCL (PF) 1 % IJ SOLN
20.0000 mg | Freq: Once | INTRAMUSCULAR | Status: AC
  Administered 2019-07-25: 20 mg via INTRADERMAL

## 2019-07-25 MED ORDER — LISINOPRIL 10 MG PO TABS: 10.0000 mg | ORAL_TABLET | Freq: Every day | ORAL | 1 refills | Status: DC

## 2019-07-25 NOTE — Assessment & Plan Note (Signed)
Clinically with chronic pain syndrome, multiple pain generators with fibromyalgia/MSK pain. Followed by pain management for lidocaine injections, as well as previously with ortho, now with current PCP. Recurrent left greater trochanter hip pain, alleviated with lidocaine injections every 4-6 weeks.  Plan: 1. Performed trigger point injections x 2 today.  See procedure note.  Tolerated well with good results after injections. 2. Continue current medication management 3. Recommend return in 4-6 weeks for re-evaluation, repeat injections if progress or new trigger points 4. Consider PT referral/other medication options in future.

## 2019-07-25 NOTE — Patient Instructions (Signed)
We have given you lidocaine injections on your left outer hip today.  As we discussed, have your labs drawn in the next 1-2 weeks and we will contact you with the results.  Your medication refills have been sent to your pharmacy on file.  We have restarted your Lisinopril at 10mg  tablet once per day.  Try to get exercise a minimum of 30 minutes per day at least 5 days per week as well as  adequate water intake all while measuring blood pressure a few times per week.  Keep a blood pressure log and bring back to clinic at your next visit.  If your readings are consistently over 140/90 to contact our office/send me a MyChart message and we will see you sooner.  Can try DASH and Mediterranean diet options, avoiding processed foods, lowering sodium intake, avoiding pork products, and eating a plant based diet for optimal health.      Mediterranean Diet  Why follow it? Research shows. . Those who follow the Mediterranean diet have a reduced risk of heart disease  . The diet is associated with a reduced incidence of Parkinson's and Alzheimer's diseases . People following the diet may have longer life expectancies and lower rates of chronic diseases  . The Dietary Guidelines for Americans recommends the Mediterranean diet as an eating plan to promote health and prevent disease  What Is the Mediterranean Diet?  . Healthy eating plan based on typical foods and recipes of Mediterranean-style cooking . The diet is primarily a plant based diet; these foods should make up a majority of meals   Starches - Plant based foods should make up a majority of meals - They are an important sources of vitamins, minerals, energy, antioxidants, and fiber - Choose whole grains, foods high in fiber and minimally processed items  - Typical grain sources include wheat, oats, barley, corn, brown rice, bulgar, farro, millet, polenta, couscous  - Various types of beans include chickpeas, lentils, fava beans, black beans,  white beans   Fruits  Veggies - Large quantities of antioxidant rich fruits & veggies; 6 or more servings  - Vegetables can be eaten raw or lightly drizzled with oil and cooked  - Vegetables common to the traditional Mediterranean Diet include: artichokes, arugula, beets, broccoli, brussel sprouts, cabbage, carrots, celery, collard greens, cucumbers, eggplant, kale, leeks, lemons, lettuce, mushrooms, okra, onions, peas, peppers, potatoes, pumpkin, radishes, rutabaga, shallots, spinach, sweet potatoes, turnips, zucchini - Fruits common to the Mediterranean Diet include: apples, apricots, avocados, cherries, clementines, dates, figs, grapefruits, grapes, melons, nectarines, oranges, peaches, pears, pomegranates, strawberries, tangerines  Fats - Replace butter and margarine with healthy oils, such as olive oil, canola oil, and tahini  - Limit nuts to no more than a handful a day  - Nuts include walnuts, almonds, pecans, pistachios, pine nuts  - Limit or avoid candied, honey roasted or heavily salted nuts - Olives are central to the [employer] - can be eaten whole or used in a variety of dishes   Meats Protein - Limiting red meat: no more than a few times a month - When eating red meat: choose lean cuts and keep the portion to the size of deck of cards - Eggs: approx. 0 to 4 times a week  - Fish and lean poultry: at least 2 a week  - Healthy protein sources include, chicken, [place], lean beef, lamb - Increase intake of seafood such as tuna, salmon, trout, mackerel, shrimp, scallops - Avoid or limit high fat  processed meats such as sausage and bacon  Dairy - Include moderate amounts of low fat dairy products  - Focus on healthy dairy such as fat free yogurt, skim milk, low or reduced fat cheese - Limit dairy products higher in fat such as whole or 2% milk, cheese, ice cream  Alcohol - Moderate amounts of red wine is ok  - No more than 5 oz daily for women (all ages) and men older than age 23   - No more than 10 oz of wine daily for men younger than 56  Other - Limit sweets and other desserts  - Use herbs and spices instead of salt to flavor foods  - Herbs and spices common to the traditional Mediterranean Diet include: basil, bay leaves, chives, cloves, cumin, fennel, garlic, lavender, marjoram, mint, oregano, parsley, pepper, rosemary, sage, savory, sumac, tarragon, thyme   It's not just a diet, it's a lifestyle:  . The Mediterranean diet includes lifestyle factors typical of those in the region  . Foods, drinks and meals are best eaten with others and savored . Daily physical activity is important for overall good health . This could be strenuous exercise like running and aerobics . This could also be more leisurely activities such as walking, housework, yard-work, or taking the stairs . Moderation is the key; a balanced and healthy diet accommodates most foods and drinks . Consider portion sizes and frequency of consumption of certain foods   Meal Ideas & Options:  . Breakfast:  o Whole wheat toast or whole wheat English muffins with peanut butter & hard boiled egg o Steel cut oats topped with apples & cinnamon and skim milk  o Fresh fruit: banana, strawberries, melon, berries, peaches  o Smoothies: strawberries, bananas, greek yogurt, peanut butter o Low fat greek yogurt with blueberries and granola  o Egg white omelet with spinach and mushrooms o Breakfast couscous: whole wheat couscous, apricots, skim milk, cranberries  . Sandwiches:  o Hummus and grilled vegetables (peppers, zucchini, squash) on whole wheat bread   o Grilled chicken on whole wheat pita with lettuce, tomatoes, cucumbers or tzatziki  o Tuna salad on whole wheat bread: tuna salad made with greek yogurt, olives, red peppers, capers, green onions o Garlic rosemary lamb pita: lamb sauted with garlic, rosemary, salt & pepper; add lettuce, cucumber, greek yogurt to pita - flavor with lemon juice and black  pepper  . Seafood:  o Mediterranean grilled salmon, seasoned with garlic, basil, parsley, lemon juice and black pepper o Shrimp, lemon, and spinach whole-grain pasta salad made with low fat greek yogurt  o Seared scallops with lemon orzo  o Seared tuna steaks seasoned salt, pepper, coriander topped with tomato mixture of olives, tomatoes, olive oil, minced garlic, parsley, green onions and cappers  . Meats:  o Herbed greek chicken salad with kalamata olives, cucumber, feta  o Red bell peppers stuffed with spinach, bulgur, lean ground beef (or lentils) & topped with feta   o Kebabs: skewers of chicken, tomatoes, onions, zucchini, squash  o [place] burgers: made with red onions, mint, dill, lemon juice, feta cheese topped with roasted red peppers . Vegetarian o Cucumber salad: cucumbers, artichoke hearts, celery, red onion, feta cheese, tossed in olive oil & lemon juice  o Hummus and whole grain pita points with a greek salad (lettuce, tomato, feta, olives, cucumbers, red onion) o Lentil soup with celery, carrots made with vegetable broth, garlic, salt and pepper  o Tabouli salad: parsley, bulgur, mint, scallions, cucumbers,  tomato, radishes, lemon juice, olive oil, salt and pepper.  We will plan to see you back in 1 month for follow up hypertension and lidocaine injections  You will receive a survey after today's visit either digitally by e-mail or paper by [employer]. Your experiences and feedback matter to [place].  Please respond so we know how we are doing as we provide care for you.  Call us with any questions/concerns/needs.  It is my goal to be available to you for your health concerns.  Thanks for choosing me to be a partner in your healthcare needs!  Harlin Rain, FNP-C Family Nurse Practitioner [facility] Phone: [phone]

## 2019-07-25 NOTE — Assessment & Plan Note (Signed)
See A/P Chronic Hip Pain

## 2019-07-25 NOTE — Progress Notes (Signed)
Subjective:    Patient ID: April Price, female    DOB: 1956/02/15, 64 y.o.   MRN: [id number]  April Price is a 64 y.o. female presenting on 07/25/2019 for Hip Pain (trigger point injection in the left hip)   HPI  April Price presents to clinic today for continued left hip pain and for lidocaine trigger point injections.  Reports adequate relief from her last injections 4 weeks ago.   Hypertension - She is checking BP at home or outside of clinic.  Readings are elevated 150's/70-80's - Current medications: None, previously was taking Lisinopril but had stopped that last year when blood pressure was better controlled, . - She is not currently symptomatic. - Pt denies headache, lightheadedness, dizziness, changes in vision, chest tightness/pressure, palpitations, leg swelling, sudden loss of speech or loss of consciousness. - She  reports no regular exercise routine. - Her diet is high in salt, high in fat, and high in carbohydrates.  Depression screen [facility] 2/9 07/25/2019 06/26/2019 05/27/2019  Decreased Interest 0 0 0  Down, Depressed, Hopeless 0 0 0  PHQ - 2 Score 0 0 0  Altered sleeping 0 - -  Tired, decreased energy 3 - -  Change in appetite 0 - -  Feeling bad or failure about yourself  0 - -  Trouble concentrating 1 - -  Moving slowly or fidgety/restless 0 - -  Suicidal thoughts 0 - -  PHQ-9 Score 4 - -  Difficult doing work/chores Not difficult at all - -  Some recent data might be hidden    Social History   Tobacco Use  . Smoking status: Current Every Day Smoker    Packs/day: 0.50    Years: 40.00    Pack years: 20.00    Types: Cigarettes  . Smokeless tobacco: Never Used  Substance Use Topics  . Alcohol use: Never  . Drug use: Never    Review of Systems  Constitutional: Negative.   HENT: Negative.   Eyes: Negative.   Respiratory: Negative.   Cardiovascular: Negative.   Gastrointestinal: Negative.   Endocrine: Negative.   Genitourinary:  Negative.   Musculoskeletal: Positive for arthralgias and myalgias. Negative for back pain, neck pain and neck stiffness.  Skin: Negative.   Allergic/Immunologic: Negative.   Neurological: Negative.   Hematological: Negative.   Psychiatric/Behavioral: Negative.    Per HPI unless specifically indicated above     Objective:    BP (!) 158/76 (BP Location: Left Arm, Patient Position: Sitting, Cuff Size: Normal)   Pulse 86   Temp (!) 97.5 F (36.4 C) (Temporal)   Ht 5\' 4"  (1.626 m)   Wt 179 lb 9.6 oz (81.5 kg)   BMI 30.83 kg/m   Wt Readings from Last 3 Encounters:  07/25/19 179 lb 9.6 oz (81.5 kg)  07/04/19 178 lb 6.4 oz (80.9 kg)  06/06/19 181 lb (82.1 kg)    Physical Exam Vitals reviewed.  Constitutional:      General: She is not in acute distress.    Appearance: Normal appearance. She is well-developed and well-groomed. She is obese. She is not ill-appearing or toxic-appearing.  HENT:     Head: Normocephalic.  Eyes:     General: Lids are normal. Vision grossly intact.        Right eye: No discharge.        Left eye: No discharge.     Extraocular Movements: Extraocular movements intact.     Conjunctiva/sclera: Conjunctivae normal.     Pupils:  Pupils are equal, round, and reactive to light.  Cardiovascular:     Rate and Rhythm: Normal rate and regular rhythm.     Pulses: Normal pulses.          Dorsalis pedis pulses are 2+ on the right side and 2+ on the left side.       Posterior tibial pulses are 2+ on the right side and 2+ on the left side.     Heart sounds: Normal heart sounds. No murmur. No friction rub. No gallop.   Pulmonary:     Effort: Pulmonary effort is normal. No respiratory distress.     Breath sounds: Normal breath sounds.  Musculoskeletal:        General: Tenderness present.     Right hip: Normal.     Left hip: Tenderness present. No lacerations, bony tenderness or crepitus. Normal strength.     Right lower leg: No edema.     Left lower leg: No edema.       Comments: Tenderness to left greater trochanter.  Feet:     Right foot:     Skin integrity: Skin integrity normal.     Left foot:     Skin integrity: Skin integrity normal.  Skin:    General: Skin is warm and dry.     Capillary Refill: Capillary refill takes less than 2 seconds.  Neurological:     General: No focal deficit present.     Mental Status: She is alert and oriented to person, place, and time.     Cranial Nerves: No cranial nerve deficit.     Sensory: No sensory deficit.     Motor: No weakness.     Coordination: Coordination normal.     Gait: Gait normal.  Psychiatric:        Attention and Perception: Attention and perception normal.        Mood and Affect: Mood and affect normal.        Speech: Speech normal.        Behavior: Behavior normal. Behavior is cooperative.        Thought Content: Thought content normal.        Cognition and Memory: Cognition and memory normal.        Judgment: Judgment normal.    ________________________________________________________ PROCEDURE NOTE Date: 07/25/2019  The trigger point was identified and confirmed by the patient. The spot was marked. Skin was cleaned with alcohol and betadine, cold spray used for superficial anesthetic. 2cc of 1% lidocaine was injected in a wheel fashion in the area. Procedure repeated for adjacent trigger point. Patient tolerated procedure well.   Results for orders placed or performed in visit on 07/04/19  Urine Culture   Specimen: Urine  Result Value Ref Range   MICRO NUMBER: [id number]    SPECIMEN QUALITY: Adequate    Sample Source URINE    STATUS: FINAL    Result: No Growth   Urinalysis, microscopic only  Result Value Ref Range   WBC, UA 10-20 (A) 0 - 5 /HPF   RBC / HPF NONE SEEN 0 - 2 /HPF   Squamous Epithelial / LPF 0-5 < OR = 5 /HPF   Bacteria, UA FEW (A) NONE SEEN /HPF   Hyaline Cast 0-5 (A) NONE SEEN /LPF  POCT Urinalysis Dipstick  Result Value Ref Range   Color, UA Yellow     Clarity, UA clear    Glucose, UA Negative Negative   Bilirubin, UA negative    Ketones, UA  negative    Spec Grav, UA 1.015 1.010 - 1.025   Blood, UA trace    pH, UA 5.0 5.0 - 8.0   Protein, UA Positive (A) Negative   Urobilinogen, UA 0.2 0.2 or 1.0 E.U./dL   Nitrite, UA negative    Leukocytes, UA Moderate (2+) (A) Negative   Appearance     Odor        Assessment & Plan:   Problem List Items Addressed This Visit      Cardiovascular and Mediastinum   Hypertension - Primary    Currently uncontrolled hypertension.  Was previously on lisinopril 10mg  daily but had been taken off medication around 1 year ago due to low blood pressure.  BP is not at goal < 130/80.  Pt is not working on lifestyle modifications.  Taking medications tolerating well without side effects. No current complications.  Plan: 1. BEGIN taking Lisinopril 10mg  daily 2. Obtain labs ordered today in the next 1-2 weeks 3. Encouraged heart healthy diet and increasing exercise to 30 minutes most days of the week, going no more than 2 days in a row without exercise. 4. Check BP 1-2 x per week at home, keep log, and bring to clinic at next appointment. 5. Follow up 1 month.      Relevant Medications   lisinopril (ZESTRIL) 10 MG tablet   EPINEPHrine (EPIPEN 2-PAK) 0.3 mg/0.3 mL IJ SOAJ injection   Other Relevant Orders   CBC with Differential   COMPLETE METABOLIC PANEL WITH GFR     Other   Chronic pain syndrome (Chronic)    See A/P Chronic Hip Pain      Chronic hip pain (Location of Secondary source of pain) (Bilateral) (L>R) (Chronic)    Clinically with chronic pain syndrome, multiple pain generators with fibromyalgia/MSK pain. Followed by pain management for lidocaine injections, as well as previously with ortho, now with current PCP. Recurrent left greater trochanter hip pain, alleviated with lidocaine injections every 4-6 weeks.  Plan: 1. Performed trigger point injections x 2 today.  See procedure note.   Tolerated well with good results after injections. 2. Continue current medication management 3. Recommend return in 4-6 weeks for re-evaluation, repeat injections if progress or new trigger points 4. Consider PT referral/other medication options in future.      Vitamin D deficiency    Status unknown.  Recheck labs. Followup after labs.       Relevant Orders   Vitamin D (25 hydroxy)    Other Visit Diagnoses    History of anaphylaxis       Relevant Medications   EPINEPHrine (EPIPEN 2-PAK) 0.3 mg/0.3 mL IJ SOAJ injection   Elevated cholesterol       Relevant Medications   lisinopril (ZESTRIL) 10 MG tablet   EPINEPHrine (EPIPEN 2-PAK) 0.3 mg/0.3 mL IJ SOAJ injection   Other Relevant Orders   Lipid Profile   Weight gain       Relevant Orders   Thyroid Panel With TSH   Screening for colon cancer       Relevant Orders   Cologuard      Meds ordered this encounter  Medications  . lisinopril (ZESTRIL) 10 MG tablet    Sig: Take 1 tablet (10 mg total) by mouth daily.    Dispense:  90 tablet    Refill:  1  . EPINEPHrine (EPIPEN 2-PAK) 0.3 mg/0.3 mL IJ SOAJ injection    Sig: Inject 0.3 mLs (0.3 mg total) into the muscle once for 1  dose.    Dispense:  0.3 mL    Refill:  0  . lidocaine (PF) (XYLOCAINE) 1 % injection 20 mg  . lidocaine (PF) (XYLOCAINE) 1 % injection 20 mg      Follow up plan: Return in about 4 weeks (around 08/22/2019) for Injections & BP Follow up.   Harlin Rain, [place] Family Nurse Practitioner [facility] 07/25/2019, 8:50 AM

## 2019-07-25 NOTE — Assessment & Plan Note (Signed)
Currently uncontrolled hypertension.  Was previously on lisinopril 10mg  daily but had been taken off medication around 1 year ago due to low blood pressure.  BP is not at goal < 130/80.  Pt is not working on lifestyle modifications.  Taking medications tolerating well without side effects. No current complications.  Plan: 1. BEGIN taking Lisinopril 10mg  daily 2. Obtain labs ordered today in the next 1-2 weeks 3. Encouraged heart healthy diet and increasing exercise to 30 minutes most days of the week, going no more than 2 days in a row without exercise. 4. Check BP 1-2 x per week at home, keep log, and bring to clinic at next appointment. 5. Follow up 1 month.

## 2019-07-25 NOTE — Assessment & Plan Note (Signed)
Status unknown.  Recheck labs.  Followup after labs.  

## 2019-07-26 LAB — THYROID PANEL WITH TSH
Free Thyroxine Index: 2.5 (ref 1.4–3.8)
T3 Uptake: 26 % (ref 22–35)
T4, Total: 9.8 ug/dL (ref 5.1–11.9)
TSH: 2 mIU/L (ref 0.40–4.50)

## 2019-07-26 LAB — COMPLETE METABOLIC PANEL WITH GFR
AG Ratio: 1.3 (calc) (ref 1.0–2.5)
ALT: 9 U/L (ref 6–29)
AST: 10 U/L (ref 10–35)
Albumin: 4.2 g/dL (ref 3.6–5.1)
Alkaline phosphatase (APISO): 113 U/L (ref 37–153)
BUN/Creatinine Ratio: 20 (calc) (ref 6–22)
BUN: 23 mg/dL (ref 7–25)
CO2: 20 mmol/L (ref 20–32)
Calcium: 10.2 mg/dL (ref 8.6–10.4)
Chloride: 108 mmol/L (ref 98–110)
Creat: 1.17 mg/dL — ABNORMAL HIGH (ref 0.50–0.99)
GFR, Est African American: 57 mL/min/{1.73_m2} — ABNORMAL LOW (ref >=60)
GFR, Est Non African American: 50 mL/min/{1.73_m2} — ABNORMAL LOW (ref >=60)
Globulin: 3.3 g/dL (calc) (ref 1.9–3.7)
Glucose, Bld: 97 mg/dL (ref 65–99)
Potassium: 5 mmol/L (ref 3.5–5.3)
Sodium: 139 mmol/L (ref 135–146)
Total Bilirubin: 0.3 mg/dL (ref 0.2–1.2)
Total Protein: 7.5 g/dL (ref 6.1–8.1)

## 2019-07-26 LAB — LIPID PANEL
Cholesterol: 270 mg/dL — ABNORMAL HIGH (ref <200)
HDL: 50 mg/dL (ref >=50)
LDL Cholesterol (Calc): 193 mg/dL (calc) — ABNORMAL HIGH
Non-HDL Cholesterol (Calc): 220 mg/dL (calc) — ABNORMAL HIGH (ref <130)
Total CHOL/HDL Ratio: 5.4 (calc) — ABNORMAL HIGH (ref <5.0)
Triglycerides: 122 mg/dL (ref <150)

## 2019-07-26 LAB — VITAMIN D 25 HYDROXY (VIT D DEFICIENCY, FRACTURES): Vit D, 25-Hydroxy: 8 ng/mL — ABNORMAL LOW (ref 30–100)

## 2019-07-26 LAB — CBC WITH DIFFERENTIAL/PLATELET
Absolute Monocytes: 621 cells/uL (ref 200–950)
Basophils Absolute: 63 cells/uL (ref 0–200)
Basophils Relative: 0.7 %
Eosinophils Absolute: 126 cells/uL (ref 15–500)
Eosinophils Relative: 1.4 %
HCT: 52.5 % — ABNORMAL HIGH (ref 35.0–45.0)
Hemoglobin: 17.5 g/dL — ABNORMAL HIGH (ref 11.7–15.5)
Lymphs Abs: 1908 cells/uL (ref 850–3900)
MCH: 30.3 pg (ref 27.0–33.0)
MCHC: 33.3 g/dL (ref 32.0–36.0)
MCV: 90.8 fL (ref 80.0–100.0)
MPV: 10.5 fL (ref 7.5–12.5)
Monocytes Relative: 6.9 %
Neutro Abs: 6282 cells/uL (ref 1500–7800)
Neutrophils Relative %: 69.8 %
Platelets: 292 10*3/uL (ref 140–400)
RBC: 5.78 10*6/uL — ABNORMAL HIGH (ref 3.80–5.10)
RDW: 13.4 % (ref 11.0–15.0)
Total Lymphocyte: 21.2 %
WBC: 9 10*3/uL (ref 3.8–10.8)

## 2019-07-28 ENCOUNTER — Ambulatory Visit: Payer: Self-pay | Admitting: Pharmacist

## 2019-07-28 ENCOUNTER — Other Ambulatory Visit: Payer: Self-pay | Admitting: Family Medicine

## 2019-07-28 DIAGNOSIS — E7800 Pure hypercholesterolemia, unspecified: Secondary | ICD-10-CM

## 2019-07-28 DIAGNOSIS — E559 Vitamin D deficiency, unspecified: Secondary | ICD-10-CM

## 2019-07-28 DIAGNOSIS — F321 Major depressive disorder, single episode, moderate: Secondary | ICD-10-CM

## 2019-07-28 DIAGNOSIS — F4323 Adjustment disorder with mixed anxiety and depressed mood: Secondary | ICD-10-CM

## 2019-07-28 DIAGNOSIS — I10 Essential (primary) hypertension: Secondary | ICD-10-CM

## 2019-07-28 DIAGNOSIS — E782 Mixed hyperlipidemia: Secondary | ICD-10-CM

## 2019-07-28 MED ORDER — VITAMIN D (ERGOCALCIFEROL) 1.25 MG (50000 UNIT) PO CAPS: 50000.0000 [IU] | ORAL_CAPSULE | ORAL | 0 refills | Status: DC

## 2019-07-28 MED ORDER — PRAVASTATIN SODIUM 20 MG PO TABS: 20.0000 mg | ORAL_TABLET | Freq: Every day | ORAL | 1 refills | Status: DC

## 2019-07-28 NOTE — Progress Notes (Signed)
Labs remain stable as to where they were the last few years for her CBC and CMP.  Thyroid labs WNL.  Cholesterol labs elevated.  It does not look like she has been on a cholesterol lowering medication in the past.  I am sending in a prescription for pravastatin (a statin with least side effects for muscle cramps/pain) to take once per day.  We can repeat these labs in 3 months.  Vitamin D deficient.  I am going to send in a vitamin D supplement to take 1 tablet weekly for the next 12 weeks.  Thanks

## 2019-07-28 NOTE — Patient Instructions (Signed)
Thank you allowing the Chronic Care Management Team to be a part of your care! It was a pleasure speaking with you today!     CCM (Chronic Care Management) Team    Noreene Larsson RN, MSN, CCM Nurse Care Coordinator  [phone]   Harlow Asa PharmD  Clinical Pharmacist  [phone]   Eula Fried LCSW Clinical Social Worker [phone]  Visit Information  Goals Addressed            This Visit's Progress   . PharmD - Medication Assistance       CARE PLAN ENTRY (see longitudinal plan of care for additional care plan information)   Current Barriers:  . Chronic Disease Management support, education, and care coordination needs related to HTN, HLD, fibromyalgia, chronic pain and Vitamin D deficiency  . Financial Barriers  Pharmacist Clinical Goal(s):  Marland Kitchen Over the next 30 days, patient will work with CM Pharmacist to address needs related to medication assistance and complete medication review  Interventions: . Inter-disciplinary care team collaboration (see longitudinal plan of care) . Comprehensive medication review performed; medication list updated in electronic medical record o Reports planning to pick up Rxs for Epipen, pravastatin and ergocalciferol from pharmacy today. Myles Rosenthal on health plan coverage and medication assistance options o Reports re-applied for extra help subsidy, submitted application online 2-3 weeks ago - Reports had this subsidy in the past, but was ineligible for a time as had a part-time job - Advise patient to watch for response via mail in the next 2 weeks . Counsel on smoking cessation o Reports ready to quit smoking o Reports currently smoking 1/4-1/2 pack/day o Reports some success with use of Chantix in past, but was unable to afford monthly tier 3 copayment to continue o Discuss referral to HTA for cost assistance if receive denial for extra help subsidy o Denies interest working with [employer] . Discuss importance of  blood pressure monitoring and control o Reports started lisinopril 10 mg daily as directed o Denies being able to check home BP as monitor no longer working o Will evaluate possible resources for helping patient to obtain new monitor  Patient Self Care Activities:  . Attends all scheduled provider appointments . Calls pharmacy for medication refills . Calls provider office for new concerns or questions   Initial goal documentation        Patient verbalizes understanding of instructions provided today.   Telephone follow up appointment with care management team member scheduled for: 5/19 at 10 am  Harlow Asa, PharmD, [place] [phone]

## 2019-07-28 NOTE — Chronic Care Management (AMB) (Signed)
Chronic Care Management   Follow Up Note   07/28/2019 Name: April Price MRN: [id number] DOB: 06-Dec-1955  Referred by: Verl Bangs, FNP Reason for referral : Chronic Care Management (Patient Phone Call)   April Price is a 64 y.o. year old female who is a primary care patient of Lorine Bears, Lupita Raider, [place]. The CCM team was consulted for assistance with chronic disease management and care coordination needs.    Received referral from CCM Nurse Case Manager for medication assistance and information on smoking cessation.  I reached out to [employer] by phone today.   Review of patient status, including review of consultants reports, relevant laboratory and other test results, and collaboration with appropriate care team members and the patient's provider was performed as part of comprehensive patient evaluation and provision of chronic care management services.    SDOH (Social Determinants of Health) screening performed today: Tobacco Use. See Care Plan for related entries.   Outpatient Encounter Medications as of 07/28/2019  Medication Sig  . albuterol (PROVENTIL HFA;VENTOLIN HFA) 108 (90 Base) MCG/ACT inhaler Inhale 2 puffs into the lungs every 4 (four) hours as needed for wheezing or shortness of breath.  Marland Kitchen azelastine (ASTELIN) 0.1 % nasal spray Place 2 sprays into both nostrils 2 (two) times daily as needed for rhinitis. Use in each nostril as directed  . busPIRone (BUSPAR) 10 MG tablet Take 1 tablet (10 mg total) by mouth 3 (three) times daily.  . DULoxetine (CYMBALTA) 60 MG capsule TAKE 1 CAPSULE EVERY DAY  . esomeprazole (NEXIUM) 40 MG capsule TAKE 1 CAPSULE EVERY DAY  . fluticasone (FLONASE) 50 MCG/ACT nasal spray Place 2 sprays into both nostrils daily.  . IBU 800 MG tablet TAKE ONE TABLET 3 TIMES DAILY  . lisinopril (ZESTRIL) 10 MG tablet Take 1 tablet (10 mg total) by mouth daily.  . meclizine (ANTIVERT) 25 MG tablet Take 1 tablet (25 mg total) by  mouth 3 (three) times daily as needed for dizziness.  . montelukast (SINGULAIR) 10 MG tablet Take 1 tablet (10 mg total) by mouth at bedtime.  . Multiple Vitamins-Minerals (PRESERVISION AREDS 2 PO) Take by mouth.  Marland Kitchen tiZANidine (ZANAFLEX) 4 MG tablet TAKE ONE TABLET FOUR TIMES DAILY AS NEEDED FOR MUSCLE SPASM  . cholecalciferol (VITAMIN D) 1000 units tablet Take 1,000 Units by mouth daily.  Marland Kitchen EPINEPHrine (EPIPEN 2-PAK) 0.3 mg/0.3 mL IJ SOAJ injection Inject 0.3 mLs (0.3 mg total) into the muscle once for 1 dose.  . pravastatin (PRAVACHOL) 20 MG tablet Take 1 tablet (20 mg total) by mouth daily.  . Vitamin D, Ergocalciferol, (DRISDOL) 1.25 MG (50000 UNIT) CAPS capsule Take 1 capsule (50,000 Units total) by mouth every 7 (seven) days.   No facility-administered encounter medications on file as of 07/28/2019.    Goals Addressed            This Visit's Progress   . PharmD - Medication Assistance       CARE PLAN ENTRY (see longitudinal plan of care for additional care plan information)   Current Barriers:  . Chronic Disease Management support, education, and care coordination needs related to HTN, HLD, fibromyalgia, chronic pain and Vitamin D deficiency  . Financial Barriers  Pharmacist Clinical Goal(s):  Marland Kitchen Over the next 30 days, patient will work with CM Pharmacist to address needs related to medication assistance and complete medication review  Interventions: . Inter-disciplinary care team collaboration (see longitudinal plan of care) . Comprehensive medication review performed; medication list  updated in electronic medical record o Reports planning to pick up Rxs for Epipen, pravastatin and ergocalciferol from pharmacy today. Myles Rosenthal on health plan coverage and medication assistance options o Reports re-applied for extra help subsidy, submitted application online 2-3 weeks ago - Reports had this subsidy in the past, but was ineligible for a time as had a part-time job - Advise  patient to watch for response via mail in the next 2 weeks . Counsel on smoking cessation o Reports ready to quit smoking o Reports currently smoking 1/4-1/2 pack/day o Reports some success with use of Chantix in past, but was unable to afford monthly tier 3 copayment to continue o Discuss referral to HTA for cost assistance if receive denial for extra help subsidy o Denies interest working with [employer] . Discuss importance of blood pressure monitoring and control o Reports started lisinopril 10 mg daily as directed o Denies being able to check home BP as monitor no longer working o Will evaluate possible resources for helping patient to obtain new monitor  Patient Self Care Activities:  . Attends all scheduled provider appointments . Calls pharmacy for medication refills . Calls provider office for new concerns or questions   Initial goal documentation        Plan  Telephone follow up appointment with care management team member scheduled for: 5/19 at 10 am  Harlow Asa, PharmD, [place] [phone]

## 2019-08-06 ENCOUNTER — Ambulatory Visit: Payer: Self-pay | Admitting: Pharmacist

## 2019-08-06 DIAGNOSIS — I10 Essential (primary) hypertension: Secondary | ICD-10-CM

## 2019-08-06 NOTE — Chronic Care Management (AMB) (Signed)
Chronic Care Management   Follow Up Note   08/06/2019 Name: Mileny Annen MRN: [id number] DOB: [birth year]  Referred by: Verl Bangs, FNP Reason for referral : Care Coordination ([employer])   Kama Braegger is a 64 y.o. year old female who is a primary care patient of Lorine Bears, Lupita Raider, FNP. The CCM team was consulted for assistance with chronic disease management and care coordination needs.    Coordination of care with [employer].    Outpatient Encounter Medications as of 08/06/2019  Medication Sig  . albuterol (PROVENTIL HFA;VENTOLIN HFA) 108 (90 Base) MCG/ACT inhaler Inhale 2 puffs into the lungs every 4 (four) hours as needed for wheezing or shortness of breath.  Marland Kitchen azelastine (ASTELIN) 0.1 % nasal spray Place 2 sprays into both nostrils 2 (two) times daily as needed for rhinitis. Use in each nostril as directed  . busPIRone (BUSPAR) 10 MG tablet Take 1 tablet (10 mg total) by mouth 3 (three) times daily.  . cholecalciferol (VITAMIN D) 1000 units tablet Take 1,000 Units by mouth daily.  . DULoxetine (CYMBALTA) 60 MG capsule TAKE 1 CAPSULE EVERY DAY  . EPINEPHrine (EPIPEN 2-PAK) 0.3 mg/0.3 mL IJ SOAJ injection Inject 0.3 mLs (0.3 mg total) into the muscle once for 1 dose.  . esomeprazole (NEXIUM) 40 MG capsule TAKE 1 CAPSULE EVERY DAY  . fluticasone (FLONASE) 50 MCG/ACT nasal spray Place 2 sprays into both nostrils daily.  . IBU 800 MG tablet TAKE ONE TABLET 3 TIMES DAILY  . lisinopril (ZESTRIL) 10 MG tablet Take 1 tablet (10 mg total) by mouth daily.  . meclizine (ANTIVERT) 25 MG tablet Take 1 tablet (25 mg total) by mouth 3 (three) times daily as needed for dizziness.  . montelukast (SINGULAIR) 10 MG tablet Take 1 tablet (10 mg total) by mouth at bedtime.  . Multiple Vitamins-Minerals (PRESERVISION AREDS 2 PO) Take by mouth.  . pravastatin (PRAVACHOL) 20 MG tablet Take 1 tablet (20 mg total) by mouth daily.  Marland Kitchen tiZANidine (ZANAFLEX) 4 MG tablet  TAKE ONE TABLET FOUR TIMES DAILY AS NEEDED FOR MUSCLE SPASM  . Vitamin D, Ergocalciferol, (DRISDOL) 1.25 MG (50000 UNIT) CAPS capsule Take 1 capsule (50,000 Units total) by mouth every 7 (seven) days.   No facility-administered encounter medications on file as of 08/06/2019.    Goals Addressed            This Visit's Progress   . PharmD - Medication Assistance       CARE PLAN ENTRY (see longitudinal plan of care for additional care plan information)   Current Barriers:  . Chronic Disease Management support, education, and care coordination needs related to HTN, HLD, fibromyalgia, chronic pain and Vitamin D deficiency  . Financial Barriers  Pharmacist Clinical Goal(s):  Marland Kitchen Over the next 30 days, patient will work with CM Pharmacist to address needs related to medication assistance and complete medication review  Interventions: . Inter-disciplinary care team collaboration (see longitudinal plan of care) . Submit request to [employer] health plan on behalf of patient for assistance with obtaining upper arm blood pressure monitor o Note patient denies being able to check home BP as previous monitor no longer working o Patient has been advised by PCP to Check BP 1-2 x per week at home o Ticket submitted vis [employer] platform, Tracking ID: [id number]  Patient Self Care Activities:  . Attends all scheduled provider appointments . Calls pharmacy for medication refills . Calls provider office for new concerns or  questions   Please see past updates related to this goal by clicking on the "Past Updates" button in the selected goal         Plan  The care management team will reach out to the patient again over the next 30 days.   Harlow Asa, PharmD, [place] [employer] [phone]

## 2019-08-11 ENCOUNTER — Ambulatory Visit: Payer: Self-pay | Admitting: Pharmacist

## 2019-08-11 DIAGNOSIS — I10 Essential (primary) hypertension: Secondary | ICD-10-CM

## 2019-08-11 NOTE — Patient Instructions (Addendum)
Thank you allowing the Chronic Care Management Team to be a part of your care! It was a pleasure speaking with you today!     CCM (Chronic Care Management) Team    Noreene Larsson RN, MSN, CCM Nurse Care Coordinator  [phone]   Harlow Asa PharmD  Clinical Pharmacist  [phone]   Eula Fried LCSW Clinical Social Worker [phone]  Visit Information  Goals Addressed            This Visit's Progress   . PharmD - Medication Assistance       CARE PLAN ENTRY (see longitudinal plan of care for additional care plan information)   Current Barriers:  . Chronic Disease Management support, education, and care coordination needs related to HTN, HLD, fibromyalgia, chronic pain and Vitamin D deficiency  . Financial Barriers  Pharmacist Clinical Goal(s):  Marland Kitchen Over the next 30 days, patient will work with CM Pharmacist to address needs related to medication assistance and complete medication review  Interventions: . Inter-disciplinary care team collaboration (see longitudinal plan of care) . Receive response from [employer] health plan via [employer] platform: advised plan does not cover BP monitor . Collaborate with [place] Management regarding patient's need for a blood pressure monitor o Patient approved to receive upper arm monitor . Advise patient will mail her an upper arm BP monitor o Confirm patient's mailing address today o Will mail patient BP monitor, along with BP log and patient education on how to monitor BP  Patient Self Care Activities:  . Attends all scheduled provider appointments . Calls pharmacy for medication refills . Calls provider office for new concerns or questions   Please see past updates related to this goal by clicking on the "Past Updates" button in the selected goal         Patient verbalizes understanding of instructions provided today.   Telephone follow up appointment with care management team member scheduled  for: 5/19 at 10 am  Harlow Asa, PharmD, [place] [phone]

## 2019-08-11 NOTE — Chronic Care Management (AMB) (Signed)
Chronic Care Management   Follow Up Note   08/11/2019 Name: April Price MRN: [id number] DOB: 1955-05-24  Referred by: Verl Bangs, FNP Reason for referral : Chronic Care Management (Patient Phone Call)   April Price is a 64 y.o. year old female who is a primary care patient of Lorine Bears, Lupita Raider, [place]. The CCM team was consulted for assistance with chronic disease management and care coordination needs.    I reached out to [employer] by phone today.   Review of patient status, including review of consultants reports, relevant laboratory and other test results, and collaboration with appropriate care team members and the patient's provider was performed as part of comprehensive patient evaluation and provision of chronic care management services.     Outpatient Encounter Medications as of 08/11/2019  Medication Sig  . albuterol (PROVENTIL HFA;VENTOLIN HFA) 108 (90 Base) MCG/ACT inhaler Inhale 2 puffs into the lungs every 4 (four) hours as needed for wheezing or shortness of breath.  Marland Kitchen azelastine (ASTELIN) 0.1 % nasal spray Place 2 sprays into both nostrils 2 (two) times daily as needed for rhinitis. Use in each nostril as directed  . busPIRone (BUSPAR) 10 MG tablet Take 1 tablet (10 mg total) by mouth 3 (three) times daily.  . cholecalciferol (VITAMIN D) 1000 units tablet Take 1,000 Units by mouth daily.  . DULoxetine (CYMBALTA) 60 MG capsule TAKE 1 CAPSULE EVERY DAY  . EPINEPHrine (EPIPEN 2-PAK) 0.3 mg/0.3 mL IJ SOAJ injection Inject 0.3 mLs (0.3 mg total) into the muscle once for 1 dose.  . esomeprazole (NEXIUM) 40 MG capsule TAKE 1 CAPSULE EVERY DAY  . fluticasone (FLONASE) 50 MCG/ACT nasal spray Place 2 sprays into both nostrils daily.  . IBU 800 MG tablet TAKE ONE TABLET 3 TIMES DAILY  . lisinopril (ZESTRIL) 10 MG tablet Take 1 tablet (10 mg total) by mouth daily.  . meclizine (ANTIVERT) 25 MG tablet Take 1 tablet (25 mg total) by mouth 3 (three) times  daily as needed for dizziness.  . montelukast (SINGULAIR) 10 MG tablet Take 1 tablet (10 mg total) by mouth at bedtime.  . Multiple Vitamins-Minerals (PRESERVISION AREDS 2 PO) Take by mouth.  . pravastatin (PRAVACHOL) 20 MG tablet Take 1 tablet (20 mg total) by mouth daily.  Marland Kitchen tiZANidine (ZANAFLEX) 4 MG tablet TAKE ONE TABLET FOUR TIMES DAILY AS NEEDED FOR MUSCLE SPASM  . Vitamin D, Ergocalciferol, (DRISDOL) 1.25 MG (50000 UNIT) CAPS capsule Take 1 capsule (50,000 Units total) by mouth every 7 (seven) days.   No facility-administered encounter medications on file as of 08/11/2019.    Goals Addressed            This Visit's Progress   . PharmD - Medication Assistance       CARE PLAN ENTRY (see longitudinal plan of care for additional care plan information)   Current Barriers:  . Chronic Disease Management support, education, and care coordination needs related to HTN, HLD, fibromyalgia, chronic pain and Vitamin D deficiency  . Financial Barriers  Pharmacist Clinical Goal(s):  Marland Kitchen Over the next 30 days, patient will work with CM Pharmacist to address needs related to medication assistance and complete medication review  Interventions: . Inter-disciplinary care team collaboration (see longitudinal plan of care) . Receive response from [employer] health plan via [employer] platform: advised plan does not cover BP monitor . Collaborate with [place] Management regarding patient's need for a blood pressure monitor o Patient approved to receive upper  arm monitor . Advise patient will mail her an upper arm BP monitor o Confirm patient's mailing address today o Will mail patient BP monitor, along with BP log and patient education on how to monitor BP  Patient Self Care Activities:  . Attends all scheduled provider appointments . Calls pharmacy for medication refills . Calls provider office for new concerns or questions   Please see past updates related to this goal by  clicking on the "Past Updates" button in the selected goal          Plan  Telephone follow up appointment with care management team member scheduled for: 5/19 at 10 am  Harlow Asa, PharmD, [place] [phone]

## 2019-08-14 ENCOUNTER — Telehealth: Payer: Self-pay | Admitting: General Practice

## 2019-08-14 ENCOUNTER — Telehealth: Payer: Self-pay

## 2019-08-14 ENCOUNTER — Ambulatory Visit (INDEPENDENT_AMBULATORY_CARE_PROVIDER_SITE_OTHER): Payer: PPO | Admitting: General Practice

## 2019-08-14 DIAGNOSIS — G894 Chronic pain syndrome: Secondary | ICD-10-CM

## 2019-08-14 DIAGNOSIS — I10 Essential (primary) hypertension: Secondary | ICD-10-CM

## 2019-08-14 DIAGNOSIS — M797 Fibromyalgia: Secondary | ICD-10-CM

## 2019-08-14 DIAGNOSIS — F321 Major depressive disorder, single episode, moderate: Secondary | ICD-10-CM

## 2019-08-14 NOTE — Patient Instructions (Signed)
Visit Information  Goals Addressed            This Visit's Progress    RN-Inital CCM Care Plan (pt-stated)       Current Barriers:   Chronic Disease Management support, education, and care coordination needs related to HTN, Depression, Fibromyalgia, and Chronic pain  Financial Barriers  Lack of social support   Clinical Goal(s) related to HTN, Depression, Fibromyalgia, and Chronic pain :  Over the next 120 days, patient will:   Work with the care management team to address educational, disease management, and care coordination needs   Call provider office for new or worsened signs and symptoms New or worsened symptom related to Chronic Pain  Call care management team with questions or concerns  Verbalize basic understanding of patient centered plan of care established today  Interventions related to HTN, Depression, Fibromyalgia, and Chronic pain : :   Evaluation of current treatment plans and patient's adherence to plan as established by provider  Assessed patient understanding of disease states- The patient has a good understanding of how to manage her pain effectively. The patient says heat works the best for her and raining colder days she is in bed most of the day. The patient is not having the best of days today and rates her pain level "all over" at a 7-8.  Her goal is for her pain level to be <5. Rates pain at 7 to 8 today. Today is not a good day because of the weather being cooler than usual. When it is sunny she feels better. Endorses taking medications as ordered and using heat application.  Assessed if the patient had received her blood pressure cuff yet and the patient said no.  Education provided that the patient will receive a blood pressure cuff and it should arrive soon.  The patient is happy that this was approved by [facility] for her. She wants to take her blood pressures and record them daily.   Evaluation of depression. The patient was a little upset today due to  her car being in the shop and the repair was not covered under her extended warranty. The patient was going to have to pay 300.00.  The patients sister allowed her to use her credit card for the bill and she will make monthly payments until she has paid her sister back. The patient was thankful for this. Empathetic listening.   Assessed patient's education and care coordination needs: The patient is smoking still. Has tried to quit before. Was successful with Chantix but cost prohibitive- well discuss options with CCM team Pharmacist.  Also will provide educational material by EMMI and My Chart system. Will use the email: [email or web address].  The patient states she is doing good when she can be active but when she can not be active due to the pain she smokes more.  Provided disease specific education to patient- review of Heart Healthy diet.  The patient likes vegetables and does not fry foods  Collaborated with appropriate clinical care team members regarding patient needs  Referral to CCM LCSW for patient reports of this being a difficult time of year for her related to grief, low social support and struggle with depression- LCSW is currently working with the patient  Referral to CCM pharmacist. The patient was getting medication help at one time but then lost it due to having a part time job. The patient can no longer do her part time job and is interested in reapplying for medication help.  Patient Self Care Activities related to HTN, Depression, Fibromyalgia, and Chronic pain :   Patient would benefit from support to  self-manage chronic health conditions  Please see past updates related to this goal by clicking on the "Past Updates" button in the selected goal         Patient verbalizes understanding of instructions provided today.   Telephone follow up appointment with care management team member scheduled for: 10/06/2019 11 am  [place], MSN, [place] Mobile: [phone]

## 2019-08-14 NOTE — Chronic Care Management (AMB) (Signed)
Chronic Care Management   Follow Up Note   08/14/2019 Name: April Price MRN: [id number] DOB: November 26, 1955  Referred by: Verl Bangs, FNP Reason for referral : Chronic Care Management (Follow up:HTN/ Chronic pain syndrome/Fibromyalgia/HTN)   [name] is a 64 y.o. year old female who is a primary care patient of Verl Bangs, FNP. The CCM team was consulted for assistance with chronic disease management and care coordination needs.    Review of patient status, including review of consultants reports, relevant laboratory and other test results, and collaboration with appropriate care team members and the patient's provider was performed as part of comprehensive patient evaluation and provision of chronic care management services.    SDOH (Social Determinants of Health) assessments performed: Yes- depression See Care Plan activities for detailed interventions related to [facility])     Outpatient Encounter Medications as of 08/14/2019  Medication Sig  . albuterol (PROVENTIL HFA;VENTOLIN HFA) 108 (90 Base) MCG/ACT inhaler Inhale 2 puffs into the lungs every 4 (four) hours as needed for wheezing or shortness of breath.  Marland Kitchen azelastine (ASTELIN) 0.1 % nasal spray Place 2 sprays into both nostrils 2 (two) times daily as needed for rhinitis. Use in each nostril as directed  . busPIRone (BUSPAR) 10 MG tablet Take 1 tablet (10 mg total) by mouth 3 (three) times daily.  . cholecalciferol (VITAMIN D) 1000 units tablet Take 1,000 Units by mouth daily.  . DULoxetine (CYMBALTA) 60 MG capsule TAKE 1 CAPSULE EVERY DAY  . EPINEPHrine (EPIPEN 2-PAK) 0.3 mg/0.3 mL IJ SOAJ injection Inject 0.3 mLs (0.3 mg total) into the muscle once for 1 dose.  . esomeprazole (NEXIUM) 40 MG capsule TAKE 1 CAPSULE EVERY DAY  . fluticasone (FLONASE) 50 MCG/ACT nasal spray Place 2 sprays into both nostrils daily.  . IBU 800 MG tablet TAKE ONE TABLET 3 TIMES DAILY  . lisinopril (ZESTRIL) 10 MG tablet Take 1  tablet (10 mg total) by mouth daily.  . meclizine (ANTIVERT) 25 MG tablet Take 1 tablet (25 mg total) by mouth 3 (three) times daily as needed for dizziness.  . montelukast (SINGULAIR) 10 MG tablet Take 1 tablet (10 mg total) by mouth at bedtime.  . Multiple Vitamins-Minerals (PRESERVISION AREDS 2 PO) Take by mouth.  . pravastatin (PRAVACHOL) 20 MG tablet Take 1 tablet (20 mg total) by mouth daily.  Marland Kitchen tiZANidine (ZANAFLEX) 4 MG tablet TAKE ONE TABLET FOUR TIMES DAILY AS NEEDED FOR MUSCLE SPASM  . Vitamin D, Ergocalciferol, (DRISDOL) 1.25 MG (50000 UNIT) CAPS capsule Take 1 capsule (50,000 Units total) by mouth every 7 (seven) days.   No facility-administered encounter medications on file as of 08/14/2019.     Objective:  BP Readings from Last 3 Encounters:  07/25/19 (!) 158/76  07/04/19 (!) 144/73  06/06/19 (!) 169/79    Goals Addressed            This Visit's Progress   . RN-Inital CCM Care Plan (pt-stated)       Current Barriers:  . Chronic Disease Management support, education, and care coordination needs related to HTN, Depression, Fibromyalgia, and Chronic pain . Financial Barriers . Lack of social support   Clinical Goal(s) related to HTN, Depression, Fibromyalgia, and Chronic pain :  Over the next 120 days, patient will:  . Work with the care management team to address educational, disease management, and care coordination needs  . Call provider office for new or worsened signs and symptoms New or worsened symptom related to Chronic  Pain . Call care management team with questions or concerns . Verbalize basic understanding of patient centered plan of care established today  Interventions related to HTN, Depression, Fibromyalgia, and Chronic pain : :  . Evaluation of current treatment plans and patient's adherence to plan as established by provider . Assessed patient understanding of disease states- The patient has a good understanding of how to manage her pain effectively.  The patient says heat works the best for her and raining colder days she is in bed most of the day. The patient is not having the best of days today and rates her pain level "all over" at a 7-8.  Her goal is for her pain level to be <5. Rates pain at 7 to 8 today. Today is not a good day because of the weather being cooler than usual. When it is sunny she feels better. Endorses taking medications as ordered and using heat application. . Assessed if the patient had received her blood pressure cuff yet and the patient said no.  Education provided that the patient will receive a blood pressure cuff and it should arrive soon.  The patient is happy that this was approved by [facility] for her. She wants to take her blood pressures and record them daily.  . Evaluation of depression. The patient was a little upset today due to her car being in the shop and the repair was not covered under her extended warranty. The patient was going to have to pay 300.00.  The patients sister allowed her to use her credit card for the bill and she will make monthly payments until she has paid her sister back. The patient was thankful for this. Empathetic listening.  . Assessed patient's education and care coordination needs: The patient is smoking still. Has tried to quit before. Was successful with Chantix but cost prohibitive- well discuss options with CCM team Pharmacist.  Also will provide educational material by EMMI and My Chart system. Will use the email: [email or web address] .net.  The patient states she is doing good when she can be active but when she can not be active due to the pain she smokes more. . Provided disease specific education to patient- review of Heart Healthy diet.  The patient likes vegetables and does not fry foods . Collaborated with appropriate clinical care team members regarding patient needs . Referral to CCM LCSW for patient reports of this being a difficult time of year for her related to grief, low social support  and struggle with depression- LCSW is currently working with the patient . Referral to CCM pharmacist. The patient was getting medication help at one time but then lost it due to having a part time job. The patient can no longer do her part time job and is interested in reapplying for medication help.   Patient Self Care Activities related to HTN, Depression, Fibromyalgia, and Chronic pain :  . Patient would benefit from support to  self-manage chronic health conditions  Please see past updates related to this goal by clicking on the "Past Updates" button in the selected goal          Plan:   Telephone follow up appointment with care management team member scheduled for: 10/06/2019 at 11 am   [place], MSN, [facility] Mobile: [phone]

## 2019-08-19 ENCOUNTER — Telehealth: Payer: Self-pay

## 2019-08-22 ENCOUNTER — Other Ambulatory Visit: Payer: Self-pay

## 2019-08-22 ENCOUNTER — Encounter: Payer: Self-pay | Admitting: Family Medicine

## 2019-08-22 ENCOUNTER — Ambulatory Visit (INDEPENDENT_AMBULATORY_CARE_PROVIDER_SITE_OTHER): Payer: PPO | Admitting: Family Medicine

## 2019-08-22 VITALS — BP 155/62 | HR 86 | Temp 97.1°F | Ht 64.0 in | Wt 178.2 lb

## 2019-08-22 DIAGNOSIS — I10 Essential (primary) hypertension: Secondary | ICD-10-CM | POA: Diagnosis not present

## 2019-08-22 DIAGNOSIS — G8929 Other chronic pain: Secondary | ICD-10-CM

## 2019-08-22 DIAGNOSIS — M2550 Pain in unspecified joint: Secondary | ICD-10-CM | POA: Insufficient documentation

## 2019-08-22 DIAGNOSIS — G894 Chronic pain syndrome: Secondary | ICD-10-CM | POA: Diagnosis not present

## 2019-08-22 DIAGNOSIS — M25559 Pain in unspecified hip: Secondary | ICD-10-CM | POA: Diagnosis not present

## 2019-08-22 MED ORDER — METHYLPREDNISOLONE SODIUM SUCC 125 MG IJ SOLR
125.0000 mg | Freq: Once | INTRAMUSCULAR | Status: AC
  Administered 2019-08-22: 125 mg via INTRAMUSCULAR

## 2019-08-22 MED ORDER — PREDNISONE 20 MG PO TABS: 40.0000 mg | ORAL_TABLET | Freq: Every day | ORAL | 0 refills | Status: DC

## 2019-08-22 NOTE — Assessment & Plan Note (Signed)
Uncontrolled hypertension.  BP is not at goal < 130/80.  Pt is not working on lifestyle modifications.  Taking medications tolerating well without side effects.  Reports blood pressure is well controlled at home.  Given concerns of multiple joint pain x 2 weeks with reported <130/80 BP readings at home, will hold adjustment of medications until next visit.  No current complications.  Plan: 1. Continue taking Lisinopril 10mg  daily 2. Obtain labs today  3. Encouraged heart healthy diet and increasing exercise to 30 minutes most days of the week, going no more than 2 days in a row without exercise. 4. Check BP 1-2 x per week at home, keep log, and bring to clinic at next appointment. 5. Follow up 3 months.

## 2019-08-22 NOTE — Assessment & Plan Note (Signed)
Clinically with chronic pain syndrome with multiple pain generators with diagnosis of fibromyalgia/MSK pain. Has followed with pain management in the past for lidocaine injections as well as with orthopedics, and now currently manages with our clinic. Recurrent left greater trochanter hip pain, alleviated with lidocaine injections every 4-6 weeks.  Plan: 1. Performed trigger point injection today.  See procedure note.  Tolerated well with good results post injection and is neurovascularly intact prior at time of discharge. 2. Continue current medication treatment. 3. Will give steroid injection today in addition to beginning prednisone x 5 days for increased all over joint pain 4. Recommend return in 4-6 weeks for re-evaluation, repeat injections as needed 5. Consider PT referral/other medication options/referral to pain management in the future

## 2019-08-22 NOTE — Assessment & Plan Note (Signed)
Reports multiple joint pain x 2 weeks.  Reports has not been evaluated for RA in the past.  Will order labs for evaluation.  Plan: 1. Labs ordered for evaluation 2. Will follow up after labs resulted.

## 2019-08-22 NOTE — Progress Notes (Signed)
Subjective:    Patient ID: April Price, female    DOB: [birth year], 64 y.o.   MRN: [id number]  April Price is a 64 y.o. female presenting on 08/22/2019 for Hypertension and Hip Pain (chronic left hip pain )   HPI   April Price presents to clinic today for trigger point injections for continued left hip pain and left shoulder pain.  Reports adequate relief from her last injections 4 weeks ago but has recently started to have multiple joint pain x 2 weeks ago that has increased her pain.  Hypertension - She is checking BP at home or outside of clinic.  Readings reported as <130/80 - Current medications: lisinopril 10mg  daily, tolerating well without side effects - She is not currently symptomatic. - Pt denies headache, lightheadedness, dizziness, changes in vision, chest tightness/pressure, palpitations, leg swelling, sudden loss of speech or loss of consciousness. - She  reports no regular exercise routine. - Her diet is high in salt, high in fat, and high in carbohydrates.   Depression screen [facility] 2/9 07/25/2019 06/26/2019 05/27/2019  Decreased Interest 0 0 0  Down, Depressed, Hopeless 0 0 0  PHQ - 2 Score 0 0 0  Altered sleeping 0 - -  Tired, decreased energy 3 - -  Change in appetite 0 - -  Feeling bad or failure about yourself  0 - -  Trouble concentrating 1 - -  Moving slowly or fidgety/restless 0 - -  Suicidal thoughts 0 - -  PHQ-9 Score 4 - -  Difficult doing work/chores Not difficult at all - -  Some recent data might be hidden    Social History   Tobacco Use  . Smoking status: Current Every Day Smoker    Packs/day: 0.50    Years: 40.00    Pack years: 20.00    Types: Cigarettes  . Smokeless tobacco: Never Used  Substance Use Topics  . Alcohol use: Never  . Drug use: Never    Review of Systems  Constitutional: Negative.   HENT: Negative.   Eyes: Negative.   Respiratory: Negative.   Cardiovascular: Negative.   Gastrointestinal: Negative.    Endocrine: Negative.   Genitourinary: Negative.   Musculoskeletal: Positive for arthralgias and myalgias. Negative for back pain, gait problem, joint swelling, neck pain and neck stiffness.  Skin: Negative.   Allergic/Immunologic: Negative.   Neurological: Negative.   Hematological: Negative.   Psychiatric/Behavioral: Negative.    Per HPI unless specifically indicated above     Objective:    BP (!) 155/62   Pulse 86   Temp (!) 97.1 F (36.2 C) (Temporal)   Ht 5\' 4"  (1.626 m)   Wt 178 lb 3.2 oz (80.8 kg)   BMI 30.59 kg/m   Wt Readings from Last 3 Encounters:  08/22/19 178 lb 3.2 oz (80.8 kg)  07/25/19 179 lb 9.6 oz (81.5 kg)  07/04/19 178 lb 6.4 oz (80.9 kg)    Physical Exam Vitals reviewed.  Constitutional:      General: She is not in acute distress.    Appearance: Normal appearance. She is well-developed and well-groomed. She is obese. She is not ill-appearing or toxic-appearing.  HENT:     Head: Normocephalic.  Eyes:     General: Lids are normal. Vision grossly intact.        Right eye: No discharge.        Left eye: No discharge.     Extraocular Movements: Extraocular movements intact.     Conjunctiva/sclera:  Conjunctivae normal.     Pupils: Pupils are equal, round, and reactive to light.  Cardiovascular:     Rate and Rhythm: Normal rate and regular rhythm.     Pulses: Normal pulses.          Dorsalis pedis pulses are 2+ on the right side and 2+ on the left side.       Posterior tibial pulses are 2+ on the right side and 2+ on the left side.     Heart sounds: Normal heart sounds. No murmur. No friction rub. No gallop.   Pulmonary:     Effort: Pulmonary effort is normal. No respiratory distress.     Breath sounds: Normal breath sounds.  Musculoskeletal:        General: Tenderness present.       Arms:     Right hip: Normal.     Left hip: Tenderness present. No lacerations, bony tenderness or crepitus. Normal strength.     Right lower leg: No edema.     Left  lower leg: No edema.     Comments: Tenderness to left greater trochanter.  Feet:     Right foot:     Skin integrity: Skin integrity normal.     Left foot:     Skin integrity: Skin integrity normal.  Skin:    General: Skin is warm and dry.     Capillary Refill: Capillary refill takes less than 2 seconds.  Neurological:     General: No focal deficit present.     Mental Status: She is alert and oriented to person, place, and time.     Cranial Nerves: No cranial nerve deficit.     Sensory: No sensory deficit.     Motor: No weakness.     Coordination: Coordination normal.     Gait: Gait normal.  Psychiatric:        Attention and Perception: Attention and perception normal.        Mood and Affect: Mood and affect normal.        Speech: Speech normal.        Behavior: Behavior normal. Behavior is cooperative.        Thought Content: Thought content normal.        Cognition and Memory: Cognition and memory normal.        Judgment: Judgment normal.    ________________________________________________________ PROCEDURE NOTE Date: 08/22/2019  Trigger Point  The trigger points were identified and confirmed by the patient.  One on left greater trochanter and one as marked on imaging for left subscapular tenderness.  The sites were marked. Area cleansed with alcohol wipes, cold spray used for superficial anesthetic. Using 25 gauge 1.5 inch needle, medicine was injected in a wheel fashion in the area. Patient tolerated procedures well without bleeding or paresthesias. No complications.  Results for orders placed or performed in visit on 07/25/19  CBC with Differential  Result Value Ref Range   WBC 9.0 3.8 - 10.8 Thousand/uL   RBC 5.78 (H) 3.80 - 5.10 Million/uL   Hemoglobin 17.5 (H) 11.7 - 15.5 g/dL   HCT 52.5 (H) 35.0 - 45.0 %   MCV 90.8 80.0 - 100.0 fL   MCH 30.3 27.0 - 33.0 pg   MCHC 33.3 32.0 - 36.0 g/dL   RDW 13.4 11.0 - 15.0 %   Platelets 292 140 - 400 Thousand/uL   MPV 10.5 7.5  - 12.5 fL   Neutro Abs 6,282 1,500 - 7,800 cells/uL   Lymphs Abs  1,908 850 - 3,900 cells/uL   Absolute Monocytes 621 200 - 950 cells/uL   Eosinophils Absolute 126 15 - 500 cells/uL   Basophils Absolute 63 0 - 200 cells/uL   Neutrophils Relative % 69.8 %   Total Lymphocyte 21.2 %   Monocytes Relative 6.9 %   Eosinophils Relative 1.4 %   Basophils Relative 0.7 %  COMPLETE METABOLIC PANEL WITH GFR  Result Value Ref Range   Glucose, Bld 97 65 - 99 mg/dL   BUN 23 7 - 25 mg/dL   Creat 1.17 (H) 0.50 - 0.99 mg/dL   GFR, Est Non African American 50 (L) > OR = 60 mL/min/1.58m2   GFR, Est African American 57 (L) > OR = 60 mL/min/1.93m2   BUN/Creatinine Ratio 20 6 - 22 (calc)   Sodium 139 135 - 146 mmol/L   Potassium 5.0 3.5 - 5.3 mmol/L   Chloride 108 98 - 110 mmol/L   CO2 20 20 - 32 mmol/L   Calcium 10.2 8.6 - 10.4 mg/dL   Total Protein 7.5 6.1 - 8.1 g/dL   Albumin 4.2 3.6 - 5.1 g/dL   Globulin 3.3 1.9 - 3.7 g/dL (calc)   AG Ratio 1.3 1.0 - 2.5 (calc)   Total Bilirubin 0.3 0.2 - 1.2 mg/dL   Alkaline phosphatase (APISO) 113 37 - 153 U/L   AST 10 10 - 35 U/L   ALT 9 6 - 29 U/L  Thyroid Panel With TSH  Result Value Ref Range   T3 Uptake 26 22 - 35 %   T4, Total 9.8 5.1 - 11.9 mcg/dL   Free Thyroxine Index 2.5 1.4 - 3.8   TSH 2.00 0.40 - 4.50 mIU/L  Lipid Profile  Result Value Ref Range   Cholesterol 270 (H) <200 mg/dL   HDL 50 > OR = 50 mg/dL   Triglycerides 122 <150 mg/dL   LDL Cholesterol (Calc) 193 (H) mg/dL (calc)   Total CHOL/HDL Ratio 5.4 (H) <5.0 (calc)   Non-HDL Cholesterol (Calc) 220 (H) <130 mg/dL (calc)  Vitamin D (25 hydroxy)  Result Value Ref Range   Vit D, 25-Hydroxy 8 (L) 30 - 100 ng/mL      Assessment & Plan:   Problem List Items Addressed This Visit      Cardiovascular and Mediastinum   Hypertension    Uncontrolled hypertension.  BP is not at goal < 130/80.  Pt is not working on lifestyle modifications.  Taking medications tolerating well without side  effects.  Reports blood pressure is well controlled at home.  Given concerns of multiple joint pain x 2 weeks with reported <130/80 BP readings at home, will hold adjustment of medications until next visit.  No current complications.  Plan: 1. Continue taking Lisinopril 10mg  daily 2. Obtain labs today  3. Encouraged heart healthy diet and increasing exercise to 30 minutes most days of the week, going no more than 2 days in a row without exercise. 4. Check BP 1-2 x per week at home, keep log, and bring to clinic at next appointment. 5. Follow up 3 months.       Relevant Orders   Basic Metabolic Panel (BMET)     Other   Chronic pain syndrome (Chronic)   Relevant Medications   predniSONE (DELTASONE) 20 MG tablet   Chronic hip pain (Location of Secondary source of pain) (Bilateral) (L>R) (Chronic)    Clinically with chronic pain syndrome with multiple pain generators with diagnosis of fibromyalgia/MSK pain. Has followed with pain management  in the past for lidocaine injections as well as with orthopedics, and now currently manages with our clinic. Recurrent left greater trochanter hip pain, alleviated with lidocaine injections every 4-6 weeks.  Plan: 1. Performed trigger point injection today.  See procedure note.  Tolerated well with good results post injection and is neurovascularly intact prior at time of discharge. 2. Continue current medication treatment. 3. Will give steroid injection today in addition to beginning prednisone x 5 days for increased all over joint pain 4. Recommend return in 4-6 weeks for re-evaluation, repeat injections as needed 5. Consider PT referral/other medication options/referral to pain management in the future      Relevant Medications   predniSONE (DELTASONE) 20 MG tablet   Joint pain - Primary    Reports multiple joint pain x 2 weeks.  Reports has not been evaluated for RA in the past.  Will order labs for evaluation.  Plan: 1. Labs ordered for  evaluation 2. Will follow up after labs resulted.      Relevant Medications   predniSONE (DELTASONE) 20 MG tablet   Other Relevant Orders   Cyclic citrul peptide antibody, IgG   Rheumatoid factor   Antinuclear Antib (ANA)      Meds ordered this encounter  Medications  . predniSONE (DELTASONE) 20 MG tablet    Sig: Take 2 tablets (40 mg total) by mouth daily with breakfast for 5 days.    Dispense:  10 tablet    Refill:  0  . methylPREDNISolone sodium succinate (SOLU-MEDROL) 125 mg/2 mL injection 125 mg   Follow up plan: Return in about 3 months (around 11/22/2019) for HTN F/U and PRN for injections.   Harlin Rain, [place] [facility] Nurse Practitioner [facility] 08/22/2019, 11:54 AM

## 2019-08-22 NOTE — Patient Instructions (Signed)
We have given you a steroid injection today in clinic for your multiple joint pain.  Have your labs completed before you leave clinic and I will be in touch once we receive the results.  I have sent in a prescription for Prednisone 40mg , to take daily x the next 5 days.  If you have continued multiple joint pain, we can discuss looking into pain management or finding a provider that specializes in fibromyalgia.  Continue taking your medications as prescribed.  We will hold off on any medication changes, as BP is likely elevated to increased pain in clinic and you report readings that are >130/80 at home.  We will plan to see you back in as needed for injections and in 3 months for hypertension follow up  You will receive a survey after today's visit either digitally by e-mail or paper by [employer]. Your experiences and feedback matter to [place].  Please respond so we know how we are doing as we provide care for you.  Call us with any questions/concerns/needs.  It is my goal to be available to you for your health concerns.  Thanks for choosing me to be a partner in your healthcare needs!  Harlin Rain, FNP-C Family Nurse Practitioner [facility] Phone: [phone]

## 2019-08-25 LAB — CYCLIC CITRUL PEPTIDE ANTIBODY, IGG: Cyclic Citrullin Peptide Ab: <16 UNITS

## 2019-08-25 LAB — ANA: Anti Nuclear Antibody (ANA): NEGATIVE

## 2019-08-25 LAB — RHEUMATOID FACTOR: Rheumatoid fact SerPl-aCnc: <14 IU/mL (ref <14)

## 2019-08-27 ENCOUNTER — Ambulatory Visit: Payer: Self-pay | Admitting: Pharmacist

## 2019-08-27 DIAGNOSIS — G8929 Other chronic pain: Secondary | ICD-10-CM

## 2019-08-27 DIAGNOSIS — I10 Essential (primary) hypertension: Secondary | ICD-10-CM | POA: Diagnosis not present

## 2019-08-27 DIAGNOSIS — F321 Major depressive disorder, single episode, moderate: Secondary | ICD-10-CM | POA: Diagnosis not present

## 2019-08-27 NOTE — Patient Instructions (Signed)
Thank you allowing the Chronic Care Management Team to be a part of your care! It was a pleasure speaking with you today!     CCM (Chronic Care Management) Team    Noreene Larsson RN, MSN, CCM Nurse Care Coordinator  [phone]   Harlow Asa PharmD  Clinical Pharmacist  [phone]   Eula Fried LCSW Clinical Social Worker [phone]  Visit Information  Goals Addressed            This Visit's Progress   . PharmD - Medication Assistance       CARE PLAN ENTRY (see longitudinal plan of care for additional care plan information)   Current Barriers:  . Chronic Disease Management support, education, and care coordination needs related to HTN, HLD, fibromyalgia, chronic pain and Vitamin D deficiency  . Financial Barriers  Pharmacist Clinical Goal(s):  Marland Kitchen Over the next 30 days, patient will work with CM Pharmacist to address needs related to medication assistance and complete medication review  Interventions: . Inter-disciplinary care team collaboration (see longitudinal plan of care) . Follow up regarding status of extra help application/medication assistance needs ? Note previously reported re-applied for extra help ~beginning of April ? Denies having yet received reply in mail for approval/denial.  ? Advise patient to follow up with Social Security regarding status OR can also check for extra help subsidy status via Medicare.gov website. . Discuss importance of blood pressure monitoring and control ? Reports taking lisinopril 10 mg daily as directed ? Confirms has been checking home BP readings since received new upper arm monitor, readings from past week ranging: 120s/70-80s ? Reports improvement in BP results with reduced pain . Follow up regarding joint pain ? Reports joint pain has been better controlled since received methylprednisolone injection from PCP on 5/14 and taking prednisone course as ordered. ? Reports completed prednisone 40 mg once daily x 5 days  course today ? Reports not needing ibuprofen since pain has been controlled.  ? Note previously discussed risks associated with chronic use of ibuprofen 800 mg three times daily. Patient reports uses as needed, but often needing three times daily when pain present ? Patient currently on daily PPI, esomeprazole (GI protection) ? Note KDIGO guidelines recommend avoidance of prolonged use of NSAIDs in patients with eGFR 30 to 60 mL/minute/1.73 m2, particularly in patients with intercurrent disease that increases risk of acute kidney injury. ? Patient's latest eGFR ~ 50.  Marland Kitchen Counsel on smoking cessation ? Reports currently smoking ~1/4-1/2 pack/day ? Previously reported some success with use of Chantix in past, but was unable to afford monthly tier 3 copayment to continue ? Note will consider future referral to [employer] for cost assistance if receives denial for extra help subsidy ? Encourage patient to contact [employer] . Will collaborate with PCP regarding patient's pain control, ibuprofen use and renal impairment.  Patient Self Care Activities:  . Attends all scheduled provider appointments . Calls pharmacy for medication refills . Calls provider office for new concerns or questions   Please see past updates related to this goal by clicking on the "Past Updates" button in the selected goal         Patient verbalizes understanding of instructions provided today.   The care management team will reach out to the patient again over the next 30 days.   Harlow Asa, PharmD, [place] [employer] [phone]

## 2019-08-27 NOTE — Chronic Care Management (AMB) (Signed)
Chronic Care Management   Follow Up Note   08/27/2019 Name: April Price MRN: [id number] DOB: 13-Apr-1955  Referred by: Verl Bangs, FNP Reason for referral : Chronic Care Management (Patient Phone Call)   April Price is a 64 y.o. year old female who is a primary care patient of Lorine Bears, Lupita Raider, [place]. The CCM team was consulted for assistance with chronic disease management and care coordination needs.    I reached out to [employer] by phone today.   Review of patient status, including review of consultants reports, relevant laboratory and other test results, and collaboration with appropriate care team members and the patient's provider was performed as part of comprehensive patient evaluation and provision of chronic care management services.    SDOH (Social Determinants of Health) screening performed today: Tobacco Use. See Care Plan for related entries.   Objective  Lab Results  Component Value Date   CREATININE 1.17 (H) 07/25/2019  Calculated CrCl (based on adjusted body weight) ~ 51 ml/min   Outpatient Encounter Medications as of 08/27/2019  Medication Sig  . lisinopril (ZESTRIL) 10 MG tablet Take 1 tablet (10 mg total) by mouth daily.  . [DISCONTINUED] predniSONE (DELTASONE) 20 MG tablet Take 2 tablets (40 mg total) by mouth daily with breakfast for 5 days.  Marland Kitchen albuterol (PROVENTIL HFA;VENTOLIN HFA) 108 (90 Base) MCG/ACT inhaler Inhale 2 puffs into the lungs every 4 (four) hours as needed for wheezing or shortness of breath.  Marland Kitchen azelastine (ASTELIN) 0.1 % nasal spray Place 2 sprays into both nostrils 2 (two) times daily as needed for rhinitis. Use in each nostril as directed  . busPIRone (BUSPAR) 10 MG tablet Take 1 tablet (10 mg total) by mouth 3 (three) times daily.  . cholecalciferol (VITAMIN D) 1000 units tablet Take 1,000 Units by mouth daily.  . DULoxetine (CYMBALTA) 60 MG capsule TAKE 1 CAPSULE EVERY DAY  . EPINEPHrine (EPIPEN 2-PAK) 0.3  mg/0.3 mL IJ SOAJ injection Inject 0.3 mLs (0.3 mg total) into the muscle once for 1 dose.  . esomeprazole (NEXIUM) 40 MG capsule TAKE 1 CAPSULE EVERY DAY  . fluticasone (FLONASE) 50 MCG/ACT nasal spray Place 2 sprays into both nostrils daily.  . IBU 800 MG tablet TAKE ONE TABLET 3 TIMES DAILY  . meclizine (ANTIVERT) 25 MG tablet Take 1 tablet (25 mg total) by mouth 3 (three) times daily as needed for dizziness.  . montelukast (SINGULAIR) 10 MG tablet Take 1 tablet (10 mg total) by mouth at bedtime.  . Multiple Vitamins-Minerals (PRESERVISION AREDS 2 PO) Take by mouth.  . pravastatin (PRAVACHOL) 20 MG tablet Take 1 tablet (20 mg total) by mouth daily.  Marland Kitchen tiZANidine (ZANAFLEX) 4 MG tablet TAKE ONE TABLET FOUR TIMES DAILY AS NEEDED FOR MUSCLE SPASM  . Vitamin D, Ergocalciferol, (DRISDOL) 1.25 MG (50000 UNIT) CAPS capsule Take 1 capsule (50,000 Units total) by mouth every 7 (seven) days.   No facility-administered encounter medications on file as of 08/27/2019.    Goals Addressed            This Visit's Progress   . PharmD - Medication Assistance       CARE PLAN ENTRY (see longitudinal plan of care for additional care plan information)   Current Barriers:  . Chronic Disease Management support, education, and care coordination needs related to HTN, HLD, fibromyalgia, chronic pain and Vitamin D deficiency  . Financial Barriers  Pharmacist Clinical Goal(s):  Marland Kitchen Over the next 30 days, patient will work  with CM Pharmacist to address needs related to medication assistance and complete medication review  Interventions: . Inter-disciplinary care team collaboration (see longitudinal plan of care) . Follow up regarding status of extra help application/medication assistance needs ? Note previously reported re-applied for extra help ~beginning of April ? Denies having yet received reply in mail for approval/denial.  ? Advise patient to follow up with Social Security regarding status OR can also  check for extra help subsidy status via Medicare.gov website. . Discuss importance of blood pressure monitoring and control ? Reports taking lisinopril 10 mg daily as directed ? Confirms has been checking home BP readings since received new upper arm monitor, readings from past week ranging: 120s/70-80s ? Reports improvement in BP results with reduced pain . Follow up regarding joint pain ? Reports joint pain has been better controlled since received methylprednisolone injection from PCP on 5/14 and taking prednisone course as ordered. ? Reports completed prednisone 40 mg once daily x 5 days course today ? Reports not needing ibuprofen since pain has been controlled.  ? Note previously discussed risks associated with chronic use of ibuprofen 800 mg three times daily. Patient reports uses as needed, but often needing three times daily when pain present ? Patient currently on daily PPI, esomeprazole (GI protection) ? Note KDIGO guidelines recommend avoidance of prolonged use of NSAIDs in patients with eGFR 30 to 60 mL/minute/1.73 m2, particularly in patients with intercurrent disease that increases risk of acute kidney injury. ? Patient's latest eGFR ~ 50.  Marland Kitchen Counsel on smoking cessation ? Reports currently smoking ~1/4-1/2 pack/day ? Previously reported some success with use of Chantix in past, but was unable to afford monthly tier 3 copayment to continue ? Note will consider future referral to HTA for cost assistance if receives denial for extra help subsidy ? Encourage patient to contact [employer] . Will collaborate with PCP regarding patient's pain control, ibuprofen use and renal impairment.  Patient Self Care Activities:  . Attends all scheduled provider appointments . Calls pharmacy for medication refills . Calls provider office for new concerns or questions   Please see past updates related to this goal by clicking on the "Past Updates" button in the selected goal           Plan  The care management team will reach out to the patient again over the next 30 days.   Harlow Asa, PharmD, [employer] [phone]

## 2019-09-09 ENCOUNTER — Ambulatory Visit (INDEPENDENT_AMBULATORY_CARE_PROVIDER_SITE_OTHER): Payer: PPO | Admitting: Licensed Clinical Social Worker

## 2019-09-09 DIAGNOSIS — F321 Major depressive disorder, single episode, moderate: Secondary | ICD-10-CM

## 2019-09-09 DIAGNOSIS — G894 Chronic pain syndrome: Secondary | ICD-10-CM

## 2019-09-09 DIAGNOSIS — E782 Mixed hyperlipidemia: Secondary | ICD-10-CM

## 2019-09-09 DIAGNOSIS — I10 Essential (primary) hypertension: Secondary | ICD-10-CM

## 2019-09-09 DIAGNOSIS — M797 Fibromyalgia: Secondary | ICD-10-CM

## 2019-09-09 NOTE — Chronic Care Management (AMB) (Signed)
Chronic Care Management    Clinical Social Work Follow Up Note  09/09/2019 Name: April Price MRN: [id number] DOB: Mar 30, 1956  April Price Coor is a 64 y.o. year old female who is a primary care patient of Lorine Bears, Lupita Raider, FNP. The CCM team was consulted for assistance with Mental Health Counseling and Resources.   Review of patient status, including review of consultants reports, other relevant assessments, and collaboration with appropriate care team members and the patient's provider was performed as part of comprehensive patient evaluation and provision of chronic care management services.    SDOH (Social Determinants of Health) assessments performed: Yes    Outpatient Encounter Medications as of 09/09/2019  Medication Sig  . albuterol (PROVENTIL HFA;VENTOLIN HFA) 108 (90 Base) MCG/ACT inhaler Inhale 2 puffs into the lungs every 4 (four) hours as needed for wheezing or shortness of breath.  Marland Kitchen azelastine (ASTELIN) 0.1 % nasal spray Place 2 sprays into both nostrils 2 (two) times daily as needed for rhinitis. Use in each nostril as directed  . busPIRone (BUSPAR) 10 MG tablet Take 1 tablet (10 mg total) by mouth 3 (three) times daily.  . cholecalciferol (VITAMIN D) 1000 units tablet Take 1,000 Units by mouth daily.  . DULoxetine (CYMBALTA) 60 MG capsule TAKE 1 CAPSULE EVERY DAY  . EPINEPHrine (EPIPEN 2-PAK) 0.3 mg/0.3 mL IJ SOAJ injection Inject 0.3 mLs (0.3 mg total) into the muscle once for 1 dose.  . esomeprazole (NEXIUM) 40 MG capsule TAKE 1 CAPSULE EVERY DAY  . fluticasone (FLONASE) 50 MCG/ACT nasal spray Place 2 sprays into both nostrils daily.  . IBU 800 MG tablet TAKE ONE TABLET 3 TIMES DAILY  . lisinopril (ZESTRIL) 10 MG tablet Take 1 tablet (10 mg total) by mouth daily.  . meclizine (ANTIVERT) 25 MG tablet Take 1 tablet (25 mg total) by mouth 3 (three) times daily as needed for dizziness.  . montelukast (SINGULAIR) 10 MG tablet Take 1 tablet (10 mg total) by mouth at  bedtime.  . Multiple Vitamins-Minerals (PRESERVISION AREDS 2 PO) Take by mouth.  . pravastatin (PRAVACHOL) 20 MG tablet Take 1 tablet (20 mg total) by mouth daily.  Marland Kitchen tiZANidine (ZANAFLEX) 4 MG tablet TAKE ONE TABLET FOUR TIMES DAILY AS NEEDED FOR MUSCLE SPASM  . Vitamin D, Ergocalciferol, (DRISDOL) 1.25 MG (50000 UNIT) CAPS capsule Take 1 capsule (50,000 Units total) by mouth every 7 (seven) days.   No facility-administered encounter medications on file as of 09/09/2019.     Goals Addressed    . "I need help with managing my chronic pain and depression." (pt-stated)       Current Barriers:  . Chronic Mental Health needs related to pain and major depressive disorder . Financial constraints related to managing health care expenses . Limited social support . ADL IADL limitations . Mental Health Concerns  . Limited access to caregiver . Suicidal Ideation/Homicidal Ideation: No  Clinical Social Work Goal(s):  Marland Kitchen Over the next 120 days, patient will work with SW  bi-monthly  by telephone or in person to reduce or manage symptoms related to chronic pain and depression . Over the next 120 days, patient will work with SW to address concerns related to ongoing stress, isolation, pain and depressive symptoms  Interventions: . Patient interviewed and appropriate assessments performed: brief mental health assessment . Patient reports ongoing chronic pain and has not been able to find any relief. Patient is interested in a possible rheumatologist referral. Patient reports that she met new PCP and received  trigger point injections that have been helpful with her pain. Patient reports that her pain is manageable today.  . Patient completed both COVID vaccinations.  Marland Kitchen LCSW discussed coping skills for anxiety. SW used empathetic and active and reflective listening, validated patient's feelings/concerns, and provided emotional support. LCSW provided self-care education to help manage her multiple health  conditions and improve her mood.  . Provided patient with information about available mental health support resources within the area . Discussed plans with patient for ongoing care management follow up and provided patient with direct contact information for care management team . Advised patient to contact CCM Social Worker for urgent mental health needs/case management needs . Assisted patient/caregiver with obtaining information about health plan benefits . A voluntary and extensive discussion about advanced care planning including explanation and discussion of advanced was undertaken with the patient.  Explanation regarding healthcare proxy and living will was reviewed and packet with forms with explanation of how to fill them out was given.   . Patient admits that she could be more active. Healthy self-care education provided. Patient does not wish to return to the [facility] until COVID has calmed down. . Provided education to patient/caregiver about Hospice and/or Palliative Care services . Brief CBT provided throughout entire session. Patient does not want psychiatric referral at this time.  . Patient reports that her blood pressure has been good lately and that she keeps track of her blood pressure log daily.  . Patient reports that she is going to contact [facility] to cancel her upcoming steroid injection as her pain is very manageable at this time.  . Patient reports that the weather affects her pain and she has been able to be more active with the nice weather and limited chronic pain.   Patient Self Care Activities:  . Ability for insight . Motivation for treatment . Strong family or social support  Patient Coping Strengths:  . Hopefulness . Self Advocate . Able to Communicate Effectively  Patient Self Care Deficits:  . Lacks social connections  Please see past updates related to this goal by clicking on the "Past Updates" button in the selected goal       Follow Up Plan: SW will  follow up with patient by phone over the next quarter  Eula Fried, [place], MSW, [email or web address] Phone: [phone]

## 2019-09-11 ENCOUNTER — Ambulatory Visit: Payer: PPO | Admitting: Family Medicine

## 2019-09-25 ENCOUNTER — Ambulatory Visit: Payer: PPO | Admitting: Family Medicine

## 2019-10-06 ENCOUNTER — Telehealth: Payer: Self-pay | Admitting: General Practice

## 2019-10-06 ENCOUNTER — Ambulatory Visit: Payer: Self-pay | Admitting: General Practice

## 2019-10-06 DIAGNOSIS — E782 Mixed hyperlipidemia: Secondary | ICD-10-CM | POA: Diagnosis not present

## 2019-10-06 DIAGNOSIS — F17200 Nicotine dependence, unspecified, uncomplicated: Secondary | ICD-10-CM

## 2019-10-06 DIAGNOSIS — F3289 Other specified depressive episodes: Secondary | ICD-10-CM

## 2019-10-06 DIAGNOSIS — M797 Fibromyalgia: Secondary | ICD-10-CM

## 2019-10-06 DIAGNOSIS — F321 Major depressive disorder, single episode, moderate: Secondary | ICD-10-CM

## 2019-10-06 DIAGNOSIS — I10 Essential (primary) hypertension: Secondary | ICD-10-CM

## 2019-10-06 DIAGNOSIS — G894 Chronic pain syndrome: Secondary | ICD-10-CM

## 2019-10-06 NOTE — Patient Instructions (Signed)
Visit Information  Goals Addressed              This Visit's Progress   .  RN-Inital CCM Care Plan (pt-stated)        Current Barriers:  . Chronic Disease Management support, education, and care coordination needs related to HTN, Depression, Fibromyalgia, and Chronic pain . Financial Barriers . Lack of social support   Clinical Goal(s) related to HTN, Depression, Fibromyalgia, and Chronic pain :  Over the next 120 days, patient will:  . Work with the care management team to address educational, disease management, and care coordination needs  . Call provider office for new or worsened signs and symptoms New or worsened symptom related to Chronic Pain . Call care management team with questions or concerns . Verbalize basic understanding of patient centered plan of care established today  Interventions related to HTN, Depression, Fibromyalgia, and Chronic pain : :  . Evaluation of current treatment plans and patient's adherence to plan as established by provider . Assessed patient understanding of disease states- The patient has a good understanding of how to manage her pain effectively. The patient says heat works the best for her and raining colder days she is in bed most of the day. The patient is not having the best of days today and rates her pain level "all over" at a 7-8.  Her goal is for her pain level to be <5. Rates pain at 7 to 8 today. Today is not a good day because of the weather being cooler than usual. When it is sunny she feels better. Endorses taking medications as ordered and using heat application. 10-06-2019: The patient is having a good day today. States her pain is much better since seeing the pcp on 08-22-2019.  She was able to cancel her other appointments because she was able to tolerate her pain level with her usual heat application and medications regimen. She will call the pcp for changes in level or intensity of pain.  . Assessed if the patient had received her  blood pressure cuff yet and the patient said no.  Education provided that the patient will receive a blood pressure cuff and it should arrive soon.  The patient is happy that this was approved by [facility] for her. She wants to take her blood pressures and record them daily. 10-06-2019: the patient has received her blood pressure cuff and is taking her blood pressure readings at home. States it is usually around 120/78.  She says she does not know why but sometimes in the office it is elevated. Discussed white coat syndrome. The patient is keeping a record of her readings and will notify the pcp of changes. Praised patient for positive improvements with health preventative measures.  . Evaluation of depression. The patient was a little upset today due to her car being in the shop and the repair was not covered under her extended warranty. The patient was going to have to pay 300.00.  The patients sister allowed her to use her credit card for the bill and she will make monthly payments until she has paid her sister back. The patient was thankful for this. Empathetic listening. 10-06-2019: the patient was having a good day today. The patient talked about feeling better since she is not having as much pain and discomfort. Is concerned about a medical bill she received. RNCM will follow up with manager concerning this stated stressor for the patient. The patient denies any new concerns at this time.  Marland Kitchen  Assessed patient's education and care coordination needs: The patient is smoking still. Has tried to quit before. Was successful with Chantix but cost prohibitive- well discuss options with CCM team Pharmacist.  Also will provide educational material by EMMI and My Chart system. Will use the email: [email or web address] [email or web address].  The patient states she is doing good when she can be active but when she can not be active due to the pain she smokes more. 10-06-2019: The patient is smoking less now with having better pain control. The patient  states that she always smokes less when she is more active. Praised for positive changes.  . Provided disease specific education to patient- review of Heart Healthy diet.  The patient likes vegetables and does not fry foods . Collaborated with appropriate clinical care team members regarding patient needs . Referral to CCM LCSW for patient reports of this being a difficult time of year for her related to grief, low social support and struggle with depression- LCSW is currently working with the patient . Referral to CCM pharmacist. The patient was getting medication help at one time but then lost it due to having a part time job. The patient can no longer do her part time job and is interested in reapplying for medication help.   Patient Self Care Activities related to HTN, Depression, Fibromyalgia, and Chronic pain :  . Patient would benefit from support to  self-manage chronic health conditions  Please see past updates related to this goal by clicking on the "Past Updates" button in the selected goal         Patient verbalizes understanding of instructions provided today.   The care management team will reach out to the patient again over the next 60 days.   Noreene Larsson RN, MSN, [address] Mobile: [phone]

## 2019-10-06 NOTE — Chronic Care Management (AMB) (Signed)
Chronic Care Management   Follow Up Note   10/06/2019 Name: April Price MRN: [id number] DOB: 1955/10/14  Referred by: April Bangs, FNP Reason for referral : Chronic Care Management (Follow up: RNCM- Chronic disease Management and Care Coordination Needs)   April Price is a 64 y.o. year old female who is a primary care patient of April Bangs, FNP. The CCM team was consulted for assistance with chronic disease management and care coordination needs.    Review of patient status, including review of consultants reports, relevant laboratory and other test results, and collaboration with appropriate care team members and the patient's provider was performed as part of comprehensive patient evaluation and provision of chronic care management services.    SDOH (Social Determinants of Health) assessments performed: Yes See Care Plan activities for detailed interventions related to [facility])     Outpatient Encounter Medications as of 10/06/2019  Medication Sig   albuterol (PROVENTIL HFA;VENTOLIN HFA) 108 (90 Base) MCG/ACT inhaler Inhale 2 puffs into the lungs every 4 (four) hours as needed for wheezing or shortness of breath.   azelastine (ASTELIN) 0.1 % nasal spray Place 2 sprays into both nostrils 2 (two) times daily as needed for rhinitis. Use in each nostril as directed   busPIRone (BUSPAR) 10 MG tablet Take 1 tablet (10 mg total) by mouth 3 (three) times daily.   cholecalciferol (VITAMIN D) 1000 units tablet Take 1,000 Units by mouth daily.   DULoxetine (CYMBALTA) 60 MG capsule TAKE 1 CAPSULE EVERY DAY   EPINEPHrine (EPIPEN 2-PAK) 0.3 mg/0.3 mL IJ SOAJ injection Inject 0.3 mLs (0.3 mg total) into the muscle once for 1 dose.   esomeprazole (NEXIUM) 40 MG capsule TAKE 1 CAPSULE EVERY DAY   fluticasone (FLONASE) 50 MCG/ACT nasal spray Place 2 sprays into both nostrils daily.   IBU 800 MG tablet TAKE ONE TABLET 3 TIMES DAILY   lisinopril (ZESTRIL) 10 MG tablet  Take 1 tablet (10 mg total) by mouth daily.   meclizine (ANTIVERT) 25 MG tablet Take 1 tablet (25 mg total) by mouth 3 (three) times daily as needed for dizziness.   montelukast (SINGULAIR) 10 MG tablet Take 1 tablet (10 mg total) by mouth at bedtime.   Multiple Vitamins-Minerals (PRESERVISION AREDS 2 PO) Take by mouth.   pravastatin (PRAVACHOL) 20 MG tablet Take 1 tablet (20 mg total) by mouth daily.   tiZANidine (ZANAFLEX) 4 MG tablet TAKE ONE TABLET FOUR TIMES DAILY AS NEEDED FOR MUSCLE SPASM   Vitamin D, Ergocalciferol, (DRISDOL) 1.25 MG (50000 UNIT) CAPS capsule Take 1 capsule (50,000 Units total) by mouth every 7 (seven) days.   No facility-administered encounter medications on file as of 10/06/2019.     Objective:   Goals Addressed              This Visit's Progress     RN-Inital CCM Care Plan (pt-stated)        Current Barriers:   Chronic Disease Management support, education, and care coordination needs related to HTN, Depression, Fibromyalgia, and Chronic pain  Financial Barriers  Lack of social support   Clinical Goal(s) related to HTN, Depression, Fibromyalgia, and Chronic pain :  Over the next 120 days, patient will:   Work with the care management team to address educational, disease management, and care coordination needs   Call provider office for new or worsened signs and symptoms New or worsened symptom related to Chronic Pain  Call care management team with questions or concerns  Verbalize  basic understanding of patient centered plan of care established today  Interventions related to HTN, Depression, Fibromyalgia, and Chronic pain : :   Evaluation of current treatment plans and patient's adherence to plan as established by provider  Assessed patient understanding of disease states- The patient has a good understanding of how to manage her pain effectively. The patient says heat works the best for her and raining colder days she is in bed most of  the day. The patient is not having the best of days today and rates her pain level "all over" at a 7-8.  Her goal is for her pain level to be <5. Rates pain at 7 to 8 today. Today is not a good day because of the weather being cooler than usual. When it is sunny she feels better. Endorses taking medications as ordered and using heat application. 10-06-2019: The patient is having a good day today. States her pain is much better since seeing the pcp on 08-22-2019.  She was able to cancel her other appointments because she was able to tolerate her pain level with her usual heat application and medications regimen. She will call the pcp for changes in level or intensity of pain.   Assessed if the patient had received her blood pressure cuff yet and the patient said no.  Education provided that the patient will receive a blood pressure cuff and it should arrive soon.  The patient is happy that this was approved by [facility] for her. She wants to take her blood pressures and record them daily. 10-06-2019: the patient has received her blood pressure cuff and is taking her blood pressure readings at home. States it is usually around 120/78.  She says she does not know why but sometimes in the office it is elevated. Discussed white coat syndrome. The patient is keeping a record of her readings and will notify the pcp of changes. Praised patient for positive improvements with health preventative measures.   Evaluation of depression. The patient was a little upset today due to her car being in the shop and the repair was not covered under her extended warranty. The patient was going to have to pay 300.00.  The patients sister allowed her to use her credit card for the bill and she will make monthly payments until she has paid her sister back. The patient was thankful for this. Empathetic listening. 10-06-2019: the patient was having a good day today. The patient talked about feeling better since she is not having as much pain and  discomfort. Is concerned about a medical bill she received. RNCM will follow up with manager concerning this stated stressor for the patient. The patient denies any new concerns at this time.   Assessed patient's education and care coordination needs: The patient is smoking still. Has tried to quit before. Was successful with Chantix but cost prohibitive- well discuss options with CCM team Pharmacist.  Also will provide educational material by EMMI and My Chart system. Will use the email: [email or web address].  The patient states she is doing good when she can be active but when she can not be active due to the pain she smokes more. 10-06-2019: The patient is smoking less now with having better pain control. The patient states that she always smokes less when she is more active. Praised for positive changes.   Provided disease specific education to patient- review of Heart Healthy diet.  The patient likes vegetables and does not fry foods  Collaborated with appropriate clinical  care team members regarding patient needs  Referral to CCM LCSW for patient reports of this being a difficult time of year for her related to grief, low social support and struggle with depression- LCSW is currently working with the patient  Referral to CCM pharmacist. The patient was getting medication help at one time but then lost it due to having a part time job. The patient can no longer do her part time job and is interested in reapplying for medication help.   Patient Self Care Activities related to HTN, Depression, Fibromyalgia, and Chronic pain :   Patient would benefit from support to  self-manage chronic health conditions  Please see past updates related to this goal by clicking on the "Past Updates" button in the selected goal          Plan:   The care management team will reach out to the patient again over the next 60 days.    Noreene Larsson RN, MSN, [place] Mobile: [phone]

## 2019-10-08 ENCOUNTER — Telehealth: Payer: Self-pay

## 2019-10-14 ENCOUNTER — Other Ambulatory Visit: Payer: Self-pay | Admitting: Family Medicine

## 2019-10-14 DIAGNOSIS — G894 Chronic pain syndrome: Secondary | ICD-10-CM

## 2019-10-14 DIAGNOSIS — F3289 Other specified depressive episodes: Secondary | ICD-10-CM

## 2019-10-14 DIAGNOSIS — E559 Vitamin D deficiency, unspecified: Secondary | ICD-10-CM

## 2019-10-14 DIAGNOSIS — I10 Essential (primary) hypertension: Secondary | ICD-10-CM

## 2019-10-14 NOTE — Telephone Encounter (Signed)
Requested medication (s) are due for refill today - yes  Requested medication (s) are on the active medication list -yes  Future visit scheduled -yes  Last refill: 07/07/19  Notes to clinic: Request for RF non delegated Rx  Requested Prescriptions  Pending Prescriptions Disp Refills   tiZANidine (ZANAFLEX) 4 MG tablet [Pharmacy Med Name: TIZANIDINE HCL 4 MG TAB] 360 tablet 1    Sig: TAKE ONE TABLET FOUR TIMES DAILY AS NEEDED FOR MUSCLE SPASM      Not Delegated - Cardiovascular:  Alpha-2 Agonists - tizanidine Failed - 10/14/2019 11:52 AM      Failed - This refill cannot be delegated      Passed - Valid encounter within last 6 months    Recent Outpatient Visits           1 month ago Arthralgia, unspecified joint   [facility], Lupita Raider, FNP   2 months ago Essential hypertension   [facility], Lupita Raider, FNP   3 months ago Essential hypertension   [facility], Lupita Raider, FNP   4 months ago Chronic hip pain (Location of Secondary source of pain) (Bilateral) (L>R)   [place], DO   5 months ago Chronic pain syndrome   [place], DO       Future Appointments             In 1 month [place], [facility], [facility]             Signed Prescriptions Disp Refills   DULoxetine (CYMBALTA) 60 MG capsule 30 capsule 2    Sig: TAKE 1 CAPSULE EVERY DAY      Psychiatry: Antidepressants - SNRI Failed - 10/14/2019 11:52 AM      Failed - Last BP in normal range    BP Readings from Last 1 Encounters:  08/22/19 (!) 155/62          Passed - Completed PHQ-2 or PHQ-9 in the last 360 days.      Passed - Valid encounter within last 6 months    Recent Outpatient Visits           1 month ago Arthralgia, unspecified joint   [facility], Lupita Raider, FNP   2 months ago Essential hypertension   [facility], Lupita Raider, FNP   3 months ago Essential hypertension   [facility], Lupita Raider, FNP   4 months ago Chronic hip pain (Location of Secondary source of pain) (Bilateral) (L>R)   [place], DO   5 months ago Chronic pain syndrome   [place], DO       Future Appointments             In 1 month [place], [facility], [facility]                Requested Prescriptions  Pending Prescriptions Disp Refills   tiZANidine (ZANAFLEX) 4 MG tablet [Pharmacy Med Name: TIZANIDINE HCL 4 MG TAB] 360 tablet 1    Sig: TAKE ONE TABLET FOUR TIMES DAILY AS NEEDED FOR MUSCLE SPASM      Not Delegated - Cardiovascular:  Alpha-2 Agonists - tizanidine Failed - 10/14/2019 11:52 AM      Failed - This refill  cannot be delegated      Passed - Valid encounter within last 6 months    Recent Outpatient Visits           1 month ago Arthralgia, unspecified joint   [facility], Lupita Raider, FNP   2 months ago Essential hypertension   [facility], Lupita Raider, FNP   3 months ago Essential hypertension   [facility], Lupita Raider, FNP   4 months ago Chronic hip pain (Location of Secondary source of pain) (Bilateral) (L>R)   [place], DO   5 months ago Chronic pain syndrome   [place], DO       Future Appointments             In 1 month [place], [facility], [facility]             Signed Prescriptions Disp Refills   DULoxetine (CYMBALTA) 60 MG capsule 30 capsule 2    Sig: TAKE [place]      Psychiatry: Antidepressants - SNRI Failed - 10/14/2019 11:52 AM      Failed - Last BP in normal range    BP Readings from Last 1 Encounters:  08/22/19 (!) 155/62          Passed -  Completed PHQ-2 or PHQ-9 in the last 360 days.      Passed - Valid encounter within last 6 months    Recent Outpatient Visits           1 month ago Arthralgia, unspecified joint   [place], FNP   2 months ago Essential hypertension   [facility], Lupita Raider, FNP   3 months ago Essential hypertension   [facility], Lupita Raider, FNP   4 months ago Chronic hip pain (Location of Secondary source of pain) (Bilateral) (L>R)   [place], DO   5 months ago Chronic pain syndrome   [place], DO       Future Appointments             In 1 month [place], Lupita Raider, [facility], [facility]

## 2019-10-14 NOTE — Telephone Encounter (Signed)
Requested medication (s) are due for refill today: no  Requested medication (s) are on the active medication list: yes  Last refill: 07/28/19 12 each  Future visit scheduled: yes  Notes to clinic:  On med list. Medication not delegated.  50,000IU    Requested Prescriptions  Pending Prescriptions Disp Refills   Vitamin D, Ergocalciferol, (DRISDOL) 1.25 MG (50000 UNIT) CAPS capsule [Pharmacy Med Name: VITAMIN D (ERGOCALCIFEROL) 1.25 MG] 12 capsule 0    Sig: TAKE 1 CAPSULE EVERY 7 DAYS      Endocrinology:  Vitamins - Vitamin D Supplementation Failed - 10/14/2019  9:34 AM      Failed - 50,000 IU strengths are not delegated      Failed - Phosphate in normal range and within 360 days    Phosphorus  Date Value Ref Range Status  08/02/2017 4.2 2.5 - 4.5 mg/dL Final          Failed - Vitamin D in normal range and within 360 days    25-Hydroxy, Vitamin D-3  Date Value Ref Range Status  02/11/2016 18 ng/mL Final    Comment:    (NOTE) Performed At: [facility] [place], [place] [id number] [place] Sheral Apley MD [id number]    25-Hydroxy, Vitamin D-2  Date Value Ref Range Status  02/11/2016 <1.0 ng/mL Final   25-Hydroxy, Vitamin D  Date Value Ref Range Status  02/11/2016 18 (L) ng/mL Final    Comment:    (NOTE) Reference Range: All Ages: Target levels 30 - 100    Vit D, 25-Hydroxy  Date Value Ref Range Status  07/25/2019 8 (L) 30 - 100 ng/mL Final    Comment:    Vitamin D Status         25-OH Vitamin D: . Deficiency:                    <20 ng/mL Insufficiency:             20 - 29 ng/mL Optimal:                 > or = 30 ng/mL . For 25-OH Vitamin D testing on patients on  D2-supplementation and patients for whom quantitation  of D2 and D3 fractions is required, the [facility](TM) 25-OH VIT D, (D2,D3), LC/MS/MS is recommended: order  code [id number] (patients >32yrs). See Note 1 . Note 1 . For additional information, please refer  to  http://education.[facility].com/faq/FAQ199  (This link is being provided for informational/ educational purposes only.)           Passed - Ca in normal range and within 360 days    Calcium  Date Value Ref Range Status  07/25/2019 10.2 8.6 - 10.4 mg/dL Final          Passed - Valid encounter within last 12 months    Recent Outpatient Visits           1 month ago Arthralgia, unspecified joint   [place], FNP   2 months ago Essential hypertension   [facility], Lupita Raider, FNP   3 months ago Essential hypertension   [facility], Lupita Raider, FNP   4 months ago Chronic hip pain (Location of Secondary source of pain) (Bilateral) (L>R)   [place], DO   5 months ago Chronic pain syndrome   [place], DO  Future Appointments             In 1 month [place], Lupita Raider, [facility], [facility]

## 2019-10-14 NOTE — Telephone Encounter (Signed)
Attempted to contact the patient no answer, lmom with recommendation.

## 2019-10-14 NOTE — Telephone Encounter (Signed)
Would need labs again for her vitamin D for refill and show as deficient.  Typically this vitamin D supplementation is just for 12 weeks and then can be maintained with oral over the counter Vitamin D supplement (2,000 iu daily)

## 2019-10-14 NOTE — Telephone Encounter (Signed)
Requested medication (s) are due for refill today -yes  Requested medication (s) are on the active medication list -yes  Future visit scheduled -yes  Last refill: 07/28/19  Notes to clinic: Patient is requesting non delegated Rx- already declined by PCP today. Also requesting RF of high dose ibuprofen- sent for review due to patient age/dose  Requested Prescriptions  Pending Prescriptions Disp Refills   Vitamin D, Ergocalciferol, (DRISDOL) 1.25 MG (50000 UNIT) CAPS capsule [Pharmacy Med Name: VITAMIN D (ERGOCALCIFEROL) 1.25 MG] 12 capsule 0    Sig: TAKE 1 CAPSULE EVERY 7 DAYS      Endocrinology:  Vitamins - Vitamin D Supplementation Failed - 10/14/2019 11:53 AM      Failed - 50,000 IU strengths are not delegated      Failed - Phosphate in normal range and within 360 days    Phosphorus  Date Value Ref Range Status  08/02/2017 4.2 2.5 - 4.5 mg/dL Final          Failed - Vitamin D in normal range and within 360 days    25-Hydroxy, Vitamin D-3  Date Value Ref Range Status  02/11/2016 18 ng/mL Final    Comment:    (NOTE) Performed At: [facility] [place], [place] [id number] Pepkowitz Sheral Apley MD [id number]    25-Hydroxy, Vitamin D-2  Date Value Ref Range Status  02/11/2016 <1.0 ng/mL Final   25-Hydroxy, Vitamin D  Date Value Ref Range Status  02/11/2016 18 (L) ng/mL Final    Comment:    (NOTE) Reference Range: All Ages: Target levels 30 - 100    Vit D, 25-Hydroxy  Date Value Ref Range Status  07/25/2019 8 (L) 30 - 100 ng/mL Final    Comment:    Vitamin D Status         25-OH Vitamin D: . Deficiency:                    <20 ng/mL Insufficiency:             20 - 29 ng/mL Optimal:                 > or = 30 ng/mL . For 25-OH Vitamin D testing on patients on  D2-supplementation and patients for whom quantitation  of D2 and D3 fractions is required, the QuestAssureD(TM) 25-OH VIT D, (D2,D3), LC/MS/MS is recommended: order  code  [phone] (patients >85yrs). See Note 1 . Note 1 . For additional information, please refer to  http://education.Quest[facility].com/faq/FAQ199  (This link is being provided for informational/ educational purposes only.)           Passed - Ca in normal range and within 360 days    Calcium  Date Value Ref Range Status  07/25/2019 10.2 8.6 - 10.4 mg/dL Final          Passed - Valid encounter within last 12 months    Recent Outpatient Visits           1 month ago Arthralgia, unspecified joint   [place], FNP   2 months ago Essential hypertension   [facility], Lupita Raider, FNP   3 months ago Essential hypertension   [facility], Lupita Raider, FNP   4 months ago Chronic hip pain (Location of Secondary source of pain) (Bilateral) (L>R)   [facility] Olin Hauser, DO   5 months ago Chronic pain  syndrome   [facility] Olin Hauser, DO       Future Appointments             In 1 month [place], Lupita Raider, [facility], [facility]              IBU 800 MG tablet [Pharmacy Med Name: IBUPROFEN 800 MG TAB] 270 tablet 0    Sig: TAKE ONE TABLET 3 TIMES DAILY      Analgesics:  NSAIDS Failed - 10/14/2019 11:53 AM      Failed - Cr in normal range and within 360 days    Creat  Date Value Ref Range Status  07/25/2019 1.17 (H) 0.50 - 0.99 mg/dL Final    Comment:    For patients >58 years of age, the reference limit for Creatinine is approximately 13% higher for people identified as African-American. .           Failed - HGB in normal range and within 360 days    Hemoglobin  Date Value Ref Range Status  07/25/2019 17.5 (H) 11.7 - 15.5 g/dL Final          Passed - Patient is not pregnant      Passed - Valid encounter within last 12 months    Recent Outpatient Visits           1 month ago Arthralgia, unspecified joint   [facility], Lupita Raider, FNP   2 months ago Essential hypertension   [facility], Lupita Raider, FNP   3 months ago Essential hypertension   [facility], Lupita Raider, FNP   4 months ago Chronic hip pain (Location of Secondary source of pain) (Bilateral) (L>R)   [place], DO   5 months ago Chronic pain syndrome   [place], DO       Future Appointments             In 1 month [place], Lupita Raider, [facility], [facility]             Refused Prescriptions Disp Refills   lisinopril (ZESTRIL) 10 MG tablet [Pharmacy Med Name: LISINOPRIL 10 MG TAB] 90 tablet 1    Sig: TAKE 1 TABLET BY MOUTH DAILY      Cardiovascular:  ACE Inhibitors Failed - 10/14/2019 11:53 AM      Failed - Cr in normal range and within 180 days    Creat  Date Value Ref Range Status  07/25/2019 1.17 (H) 0.50 - 0.99 mg/dL Final    Comment:    For patients >73 years of age, the reference limit for Creatinine is approximately 13% higher for people identified as African-American. .           Failed - Last BP in normal range    BP Readings from Last 1 Encounters:  08/22/19 (!) 155/62          Passed - K in normal range and within 180 days    Potassium  Date Value Ref Range Status  07/25/2019 5.0 3.5 - 5.3 mmol/L Final          Passed - Patient is not pregnant      Passed - Valid encounter within last 6 months    Recent Outpatient Visits           1 month ago Arthralgia, unspecified  joint   [facility], Lupita Raider, FNP   2 months ago Essential hypertension   [facility], Lupita Raider, FNP   3 months ago Essential hypertension   [facility], Lupita Raider, FNP   4 months ago Chronic hip pain (Location of Secondary source of pain) (Bilateral) (L>R)   [place], DO    5 months ago Chronic pain syndrome   [place], DO       Future Appointments             In 1 month [place], Lupita Raider, [facility], [facility]                Requested Prescriptions  Pending Prescriptions Disp Refills   Vitamin D, Ergocalciferol, (DRISDOL) 1.25 MG (50000 UNIT) CAPS capsule [Pharmacy Med Name: VITAMIN D (ERGOCALCIFEROL) 1.25 MG] 12 capsule 0    Sig: TAKE 1 CAPSULE EVERY 7 DAYS      Endocrinology:  Vitamins - Vitamin D Supplementation Failed - 10/14/2019 11:53 AM      Failed - 50,000 IU strengths are not delegated      Failed - Phosphate in normal range and within 360 days    Phosphorus  Date Value Ref Range Status  08/02/2017 4.2 2.5 - 4.5 mg/dL Final          Failed - Vitamin D in normal range and within 360 days    25-Hydroxy, Vitamin D-3  Date Value Ref Range Status  02/11/2016 18 ng/mL Final    Comment:    (NOTE) Performed At: [facility] [place], [place] [id number] Pepkowitz Sheral Apley MD ZT:[id number]    25-Hydroxy, Vitamin D-2  Date Value Ref Range Status  02/11/2016 <1.0 ng/mL Final   25-Hydroxy, Vitamin D  Date Value Ref Range Status  02/11/2016 18 (L) ng/mL Final    Comment:    (NOTE) Reference Range: All Ages: Target levels 30 - 100    Vit D, 25-Hydroxy  Date Value Ref Range Status  07/25/2019 8 (L) 30 - 100 ng/mL Final    Comment:    Vitamin D Status         25-OH Vitamin D: . Deficiency:                    <20 ng/mL Insufficiency:             20 - 29 ng/mL Optimal:                 > or = 30 ng/mL . For 25-OH Vitamin D testing on patients on  D2-supplementation and patients for whom quantitation  of D2 and D3 fractions is required, the QuestAssureD(TM) 25-OH VIT D, (D2,D3), LC/MS/MS is recommended: order  code [id number] (patients >85yrs). See Note 1 . Note 1 . For additional information, please refer to   http://education.[facility].com/faq/FAQ199  (This link is being provided for informational/ educational purposes only.)           Passed - Ca in normal range and within 360 days    Calcium  Date Value Ref Range Status  07/25/2019 10.2 8.6 - 10.4 mg/dL Final          Passed - Valid encounter within last 12 months    Recent Outpatient Visits           1 month ago Arthralgia, unspecified  joint   [facility], Lupita Raider, FNP   2 months ago Essential hypertension   [facility], Lupita Raider, FNP   3 months ago Essential hypertension   [facility], Lupita Raider, FNP   4 months ago Chronic hip pain (Location of Secondary source of pain) (Bilateral) (L>R)   [facility] Olin Hauser, DO   5 months ago Chronic pain syndrome   [facility], Devonne Doughty, DO       Future Appointments             In 1 month [place], Lupita Raider, [facility], [facility]              IBU 800 MG tablet [Pharmacy Med Name: IBUPROFEN 800 MG TAB] 270 tablet 0    Sig: TAKE ONE TABLET 3 TIMES DAILY      Analgesics:  NSAIDS Failed - 10/14/2019 11:53 AM      Failed - Cr in normal range and within 360 days    Creat  Date Value Ref Range Status  07/25/2019 1.17 (H) 0.50 - 0.99 mg/dL Final    Comment:    For patients >52 years of age, the reference limit for Creatinine is approximately 13% higher for people identified as African-American. .           Failed - HGB in normal range and within 360 days    Hemoglobin  Date Value Ref Range Status  07/25/2019 17.5 (H) 11.7 - 15.5 g/dL Final          Passed - Patient is not pregnant      Passed - Valid encounter within last 12 months    Recent Outpatient Visits           1 month ago Arthralgia, unspecified joint   [facility], Lupita Raider, FNP   2 months ago Essential hypertension   [facility], Lupita Raider, FNP   3 months ago Essential hypertension   [facility], Lupita Raider, FNP   4 months ago Chronic hip pain (Location of Secondary source of pain) (Bilateral) (L>R)   [place], DO   5 months ago Chronic pain syndrome   [place], DO       Future Appointments             In 1 month [place], Lupita Raider, [facility], [facility]             Refused Prescriptions Disp Refills   lisinopril (ZESTRIL) 10 MG tablet [Pharmacy Med Name: LISINOPRIL 10 MG TAB] 90 tablet 1    Sig: TAKE 1 TABLET BY MOUTH DAILY      Cardiovascular:  ACE Inhibitors Failed - 10/14/2019 11:53 AM      Failed - Cr in normal range and within 180 days    Creat  Date Value Ref Range Status  07/25/2019 1.17 (H) 0.50 - 0.99 mg/dL Final    Comment:    For patients >16 years of age, the reference limit for Creatinine is approximately 13% higher for people identified as African-American. .           Failed - Last BP in normal range    BP Readings from Last 1 Encounters:  08/22/19 (!) 155/62  Passed - K in normal range and within 180 days    Potassium  Date Value Ref Range Status  07/25/2019 5.0 3.5 - 5.3 mmol/L Final          Passed - Patient is not pregnant      Passed - Valid encounter within last 6 months    Recent Outpatient Visits           1 month ago Arthralgia, unspecified joint   [place], FNP   2 months ago Essential hypertension   [facility], Lupita Raider, FNP   3 months ago Essential hypertension   [facility], Lupita Raider, FNP   4 months ago Chronic hip pain (Location of Secondary source of pain) (Bilateral) (L>R)   [place], DO   5 months ago Chronic pain syndrome   [facility] Olin Hauser,  DO       Future Appointments             In 1 month [place], Lupita Raider, [facility], [facility]

## 2019-11-03 ENCOUNTER — Telehealth: Payer: Self-pay

## 2019-11-03 ENCOUNTER — Telehealth: Payer: Self-pay | Admitting: Pharmacist

## 2019-11-03 NOTE — Telephone Encounter (Addendum)
°  Chronic Care Management   Outreach Note  11/03/2019 Name: April Price MRN: [id number] DOB: Nov 16, 1955  Referred by: Verl Bangs, FNP Reason for referral : No chief complaint on file.  Attempted to contact patient for medication management review. Left HIPAA compliant message for patient to return my call at their convenience.   Plan: - Will collaborate with Care Guide to outreach to schedule follow up with me   Harlow Asa, PharmD, [facility] [phone]

## 2019-11-04 ENCOUNTER — Telehealth: Payer: Self-pay

## 2019-11-04 NOTE — Chronic Care Management (AMB) (Signed)
  Care Management   Note  11/04/2019 Name: April Price MRN: [id number] DOB: [birth year]  [name] is a 64 y.o. year old female who is a primary care patient of [place], Lupita Raider, [place] and is actively engaged with the care management team. I reached out to [name] by phone today to assist with re-scheduling a follow up visit with the Pharmacist  Follow up plan: Telephone appointment with care management team member scheduled for:11/24/2019  Noreene Larsson, [place], [place], [place] [address] Direct Dial: [phone] [email or web address]

## 2019-11-21 ENCOUNTER — Other Ambulatory Visit: Payer: Self-pay

## 2019-11-21 ENCOUNTER — Ambulatory Visit (INDEPENDENT_AMBULATORY_CARE_PROVIDER_SITE_OTHER): Payer: PPO | Admitting: Family Medicine

## 2019-11-21 ENCOUNTER — Encounter: Payer: Self-pay | Admitting: Family Medicine

## 2019-11-21 VITALS — BP 128/74 | HR 75 | Temp 98.2°F | Resp 20 | Ht 64.0 in | Wt 177.8 lb

## 2019-11-21 DIAGNOSIS — M8580 Other specified disorders of bone density and structure, unspecified site: Secondary | ICD-10-CM | POA: Insufficient documentation

## 2019-11-21 DIAGNOSIS — I10 Essential (primary) hypertension: Secondary | ICD-10-CM | POA: Diagnosis not present

## 2019-11-21 DIAGNOSIS — E785 Hyperlipidemia, unspecified: Secondary | ICD-10-CM

## 2019-11-21 DIAGNOSIS — Z1231 Encounter for screening mammogram for malignant neoplasm of breast: Secondary | ICD-10-CM | POA: Diagnosis not present

## 2019-11-21 DIAGNOSIS — E559 Vitamin D deficiency, unspecified: Secondary | ICD-10-CM

## 2019-11-21 DIAGNOSIS — K219 Gastro-esophageal reflux disease without esophagitis: Secondary | ICD-10-CM | POA: Insufficient documentation

## 2019-11-21 HISTORY — DX: Hyperlipidemia, unspecified: E78.5

## 2019-11-21 LAB — COMPLETE METABOLIC PANEL WITH GFR
AG Ratio: 1.4 (calc) (ref 1.0–2.5)
ALT: 10 U/L (ref 6–29)
AST: 11 U/L (ref 10–35)
Albumin: 4.2 g/dL (ref 3.6–5.1)
Alkaline phosphatase (APISO): 96 U/L (ref 37–153)
BUN/Creatinine Ratio: 17 (calc) (ref 6–22)
BUN: 20 mg/dL (ref 7–25)
CO2: 22 mmol/L (ref 20–32)
Calcium: 9.7 mg/dL (ref 8.6–10.4)
Chloride: 106 mmol/L (ref 98–110)
Creat: 1.17 mg/dL — ABNORMAL HIGH (ref 0.50–0.99)
GFR, Est African American: 57 mL/min/{1.73_m2} — ABNORMAL LOW (ref >=60)
GFR, Est Non African American: 50 mL/min/{1.73_m2} — ABNORMAL LOW (ref >=60)
Globulin: 3.1 g/dL (calc) (ref 1.9–3.7)
Glucose, Bld: 90 mg/dL (ref 65–99)
Potassium: 5 mmol/L (ref 3.5–5.3)
Sodium: 138 mmol/L (ref 135–146)
Total Bilirubin: 0.5 mg/dL (ref 0.2–1.2)
Total Protein: 7.3 g/dL (ref 6.1–8.1)

## 2019-11-21 LAB — LIPID PANEL
Cholesterol: 259 mg/dL — ABNORMAL HIGH (ref <200)
HDL: 56 mg/dL (ref >=50)
LDL Cholesterol (Calc): 174 mg/dL (calc) — ABNORMAL HIGH
Non-HDL Cholesterol (Calc): 203 mg/dL (calc) — ABNORMAL HIGH (ref <130)
Total CHOL/HDL Ratio: 4.6 (calc) (ref <5.0)
Triglycerides: 147 mg/dL (ref <150)

## 2019-11-21 LAB — VITAMIN D 25 HYDROXY (VIT D DEFICIENCY, FRACTURES): Vit D, 25-Hydroxy: 24 ng/mL — ABNORMAL LOW (ref 30–100)

## 2019-11-21 NOTE — Assessment & Plan Note (Signed)
Labs drawn today to assess for lipid control.  Currently taking pravastatin 20mg  daily and tolerating it well.   The 10-year ASCVD risk score April Bussing DC Jr., et al., 2013) is 14.5%.  Can reduce ASCVD risk to 7.1% with these interventions of quitting smoking, intensifying statin and beginning aspirin therapy.  Labs for lipid re-evaluation ordered today.  Values used to calculate the score: (Labs from 07/25/2019 - new labs from 11/21/2019 pending) Age: 65 years Sex: Female Race: Caucasian  Systolic Blood Pressure: 798 mmHg Diastolic Blood Pressure: 74 mmHg Total Cholesterol: 270 mg/dL HDL Cholesterol: 50 mg/dL LDL Cholesterol: 193 mg/dL History of Diabetes: No Tobacco smoker: Current  On Hypertension Treatment: Yes On Statin: Yes On Aspirin Therapy: No  Plan: 1) Labs today 2) Continue pravastatin 20mg  daily, to be determined based on labs  3) Heart healthy diet and to exercise every other day for 30 minutes per day, going no more than 2 days in a row without exercise. 4) We will see you back in 3 months

## 2019-11-21 NOTE — Assessment & Plan Note (Signed)
Vitamin D deficiency on 07/2019 labs with level of 8.  Has had 12 weeks of vitamin D 50,000unit supplementation.  Will have labs drawn today for re-evaluation.

## 2019-11-21 NOTE — Assessment & Plan Note (Signed)
Pt postmenopausal with history of prior DEXA scan.    Plan: 1. Obtain DG bone density.

## 2019-11-21 NOTE — Assessment & Plan Note (Signed)
Controlled hypertension.  BP is at goal < 130/80.  Pt is working on lifestyle modifications.  Taking medications tolerating well without side effects. Complications: Obesity, GERD, Hyperlipidemia  Plan: 1. Continue taking lisinopril 10mg  daily 2. Obtain labs today  3. Encouraged heart healthy diet and increasing exercise to 30 minutes most days of the week, going no more than 2 days in a row without exercise. 4. Check BP 1-2 x per week at home, keep log, and bring to clinic at next appointment. 5. Follow up 3 months.

## 2019-11-21 NOTE — Progress Notes (Signed)
Subjective:    Patient ID: April Price, female    DOB: [birth year], 64 y.o.   MRN: [id number]  April Price is a 64 y.o. female presenting on 11/21/2019 for Hypertension   HPI   [name] presents to clinic today for re-evaluation of her hypertension, hyperlipidemia, vitamin D deficiency and screening testing.  Is due for labs for repeat cholesterol evaluation, re-evaluation of vitamin D deficiency, and requesting orders for bone density screening and screening mammogram.  Reports her readings have been 120's/70's at home and her blood pressure runs higher using the automated blood pressure machine.  Hypertension - She is checking BP at home or outside of clinic.  Readings 120's/70's - Current medications: lisinopril 10mg  daily, tolerating well without side effects - She is not currently symptomatic. - Pt denies headache, lightheadedness, dizziness, changes in vision, chest tightness/pressure, palpitations, leg swelling, sudden loss of speech or loss of consciousness. - She  reports no regular exercise routine. - Her diet is moderate in salt, moderate in fat, and moderate in carbohydrates.    Depression screen [facility] 2/9 11/21/2019 07/25/2019 06/26/2019  Decreased Interest 0 0 0  Down, Depressed, Hopeless 0 0 0  PHQ - 2 Score 0 0 0  Altered sleeping 2 0 -  Tired, decreased energy 1 3 -  Change in appetite 0 0 -  Feeling bad or failure about yourself  0 0 -  Trouble concentrating 0 1 -  Moving slowly or fidgety/restless 0 0 -  Suicidal thoughts 0 0 -  PHQ-9 Score 3 4 -  Difficult doing work/chores Somewhat difficult Not difficult at all -  Some recent data might be hidden    Social History   Tobacco Use  . Smoking status: Current Every Day Smoker    Packs/day: 0.50    Years: 40.00    Pack years: 20.00    Types: Cigarettes  . Smokeless tobacco: Never Used  Vaping Use  . Vaping Use: Never used  Substance Use Topics  . Alcohol use: Never  . Drug use:  Never    Review of Systems  Constitutional: Negative.   HENT: Negative.   Eyes: Negative.   Respiratory: Negative.   Cardiovascular: Negative.   Gastrointestinal: Negative.   Endocrine: Negative.   Genitourinary: Negative.   Musculoskeletal: Negative.   Skin: Negative.   Allergic/Immunologic: Negative.   Neurological: Negative.   Hematological: Negative.   Psychiatric/Behavioral: Negative.    Per HPI unless specifically indicated above     Objective:    BP 128/74 (BP Location: Left Arm, Patient Position: Sitting, Cuff Size: Large)   Pulse 75   Temp 98.2 F (36.8 C) (Oral)   Resp 20   Ht 5\' 4"  (1.626 m)   Wt 177 lb 12.8 oz (80.6 kg)   SpO2 99%   BMI 30.52 kg/m   Wt Readings from Last 3 Encounters:  11/21/19 177 lb 12.8 oz (80.6 kg)  08/22/19 178 lb 3.2 oz (80.8 kg)  07/25/19 179 lb 9.6 oz (81.5 kg)    Physical Exam Vitals reviewed.  Constitutional:      General: She is not in acute distress.    Appearance: Normal appearance. She is well-developed and well-groomed. She is not ill-appearing or toxic-appearing.  HENT:     Head: Normocephalic and atraumatic.     Nose:     Comments: [name] is in place, covering mouth and nose. Eyes:     General: Lids are normal. Vision grossly intact.  Right eye: No discharge.        Left eye: No discharge.     Extraocular Movements: Extraocular movements intact.     Conjunctiva/sclera: Conjunctivae normal.     Pupils: Pupils are equal, round, and reactive to light.  Cardiovascular:     Rate and Rhythm: Normal rate and regular rhythm.     Pulses: Normal pulses.          Dorsalis pedis pulses are 2+ on the right side and 2+ on the left side.     Heart sounds: Normal heart sounds. No murmur heard.  No friction rub. No gallop.   Pulmonary:     Effort: Pulmonary effort is normal. No respiratory distress.     Breath sounds: Normal breath sounds.  Musculoskeletal:     Right lower leg: No edema.     Left lower leg: No  edema.  Skin:    General: Skin is warm and dry.     Capillary Refill: Capillary refill takes less than 2 seconds.  Neurological:     General: No focal deficit present.     Mental Status: She is alert and oriented to person, place, and time.  Psychiatric:        Attention and Perception: Attention and perception normal.        Mood and Affect: Mood and affect normal.        Speech: Speech normal.        Behavior: Behavior normal. Behavior is cooperative.        Thought Content: Thought content normal.        Cognition and Memory: Cognition and memory normal.        Judgment: Judgment normal.    Results for orders placed or performed in visit on 85/27/78  Cyclic citrul peptide antibody, IgG  Result Value Ref Range   Cyclic Citrullin Peptide Ab <16 UNITS  Rheumatoid factor  Result Value Ref Range   Rhuematoid fact SerPl-aCnc <14 <14 IU/mL  Antinuclear Antib (ANA)  Result Value Ref Range   Anti Nuclear Antibody (ANA) NEGATIVE NEGATIVE      Assessment & Plan:   Problem List Items Addressed This Visit      Cardiovascular and Mediastinum   Hypertension - Primary    Controlled hypertension.  BP is at goal < 130/80.  Pt is working on lifestyle modifications.  Taking medications tolerating well without side effects. Complications: Obesity, GERD, Hyperlipidemia  Plan: 1. Continue taking lisinopril 10mg  daily 2. Obtain labs today  3. Encouraged heart healthy diet and increasing exercise to 30 minutes most days of the week, going no more than 2 days in a row without exercise. 4. Check BP 1-2 x per week at home, keep log, and bring to clinic at next appointment. 5. Follow up 3 months.         Relevant Orders   COMPLETE METABOLIC PANEL WITH GFR     Musculoskeletal and Integument   Osteopenia    Pt postmenopausal with history of prior DEXA scan.    Plan: 1. Obtain DG bone density.         Relevant Orders   DG Bone Density     Other   Vitamin D deficiency    Vitamin D  deficiency on 07/2019 labs with level of 8.  Has had 12 weeks of vitamin D 50,000unit supplementation.  Will have labs drawn today for re-evaluation.      Relevant Orders   VITAMIN D 25 Hydroxy (Vit-D Deficiency, Fractures)  Encounter for screening mammogram for malignant neoplasm of breast    Pt last mammogram 12/10/2018.  Result BI-RADS 1 negative.  Plan: 1. Screening mammogram order placed.  Pt will call to schedule appointment.  Information given.       Relevant Orders   MM 3D SCREEN BREAST BILATERAL   Hyperlipidemia    Labs drawn today to assess for lipid control.  Currently taking pravastatin 20mg  daily and tolerating it well.   The 10-year ASCVD risk score Mikey Bussing DC Jr., et al., 2013) is 14.5%.  Can reduce ASCVD risk to 7.1% with these interventions of quitting smoking, intensifying statin and beginning aspirin therapy.  Labs for lipid re-evaluation ordered today.  Values used to calculate the score: (Labs from 07/25/2019 - new labs from 11/21/2019 pending) Age: 66 years Sex: Female Race: Caucasian  Systolic Blood Pressure: 250 mmHg Diastolic Blood Pressure: 74 mmHg Total Cholesterol: 270 mg/dL HDL Cholesterol: 50 mg/dL LDL Cholesterol: 193 mg/dL History of Diabetes: No Tobacco smoker: Current  On Hypertension Treatment: Yes On Statin: Yes On Aspirin Therapy: No  Plan: 1) Labs today 2) Continue pravastatin 20mg  daily, to be determined based on labs  3) Heart healthy diet and to exercise every other day for 30 minutes per day, going no more than 2 days in a row without exercise. 4) We will see you back in 3 months       Relevant Orders   Lipid Profile      No orders of the defined types were placed in this encounter.     Follow up plan: Return in about 3 months (around 02/21/2020) for HTN F/U and PRN for pain.   Harlin Rain, [facility] Nurse Practitioner [facility] 11/21/2019, 10:15 AM

## 2019-11-21 NOTE — Patient Instructions (Addendum)
For Mammogram screening for breast cancer & DEXA Scan (Bone mineral density) screening for osteoporosis  Call the [facility] below anytime to schedule your own appointment now that order has been placed.  [facility] [address] [place], [place] [address] Phone: [phone]  [facility] [address] [place], [place] [address] Phone: [phone]  Try to get exercise a minimum of 30 minutes per day at least 5 days per week as well as  adequate water intake all while measuring blood pressure a few times per week.  Keep a blood pressure log and bring back to clinic at your next visit.  If your readings are consistently over 130/80 to contact our office/send me a MyChart message and we will see you sooner.  Can try DASH and Mediterranean diet options, avoiding processed foods, lowering sodium intake, avoiding pork products, and eating a plant based diet for optimal health.  Have your labs drawn today and we will contact you with the results.  Continue lisinopril 10mg  once daily.    Some of the possible side effects are:  - angioedema: swelling of lips, mouth, and tongue.  If this rare side effect occurs, please go to ED. - cough: you could develop a dry, hacking cough caused by this medicine.  If it occurs, it will go away after stopping this medicine.  Call the clinic before stopping the medication. - kidney damage: we will monitor your labs when we start this medicine and at least one time per year.  If you do not have an change in kidney function when starting this medicine, it will provide kidney protection over time.  We will plan to see you back in 3 months for hypertension follow up visit and as needed for pain  You will receive a survey after today's visit either digitally by e-mail or paper by [place] mail. Your experiences and feedback matter to [place].  Please respond so we know how we are doing as we provide  care for you.  Call us with any questions/concerns/needs.  It is my goal to be available to you for your health concerns.  Thanks for choosing me to be a partner in your healthcare needs!  Harlin Rain, FNP-C Family Nurse Practitioner [facility] Phone: [phone]

## 2019-11-21 NOTE — Assessment & Plan Note (Signed)
Pt last mammogram 12/10/2018.  Result BI-RADS 1 negative.  Plan: 1. Screening mammogram order placed.  Pt will call to schedule appointment.  Information given.

## 2019-11-24 ENCOUNTER — Other Ambulatory Visit: Payer: Self-pay | Admitting: Family Medicine

## 2019-11-24 ENCOUNTER — Ambulatory Visit (INDEPENDENT_AMBULATORY_CARE_PROVIDER_SITE_OTHER): Payer: PPO | Admitting: Pharmacist

## 2019-11-24 DIAGNOSIS — E782 Mixed hyperlipidemia: Secondary | ICD-10-CM

## 2019-11-24 DIAGNOSIS — E559 Vitamin D deficiency, unspecified: Secondary | ICD-10-CM

## 2019-11-24 DIAGNOSIS — E785 Hyperlipidemia, unspecified: Secondary | ICD-10-CM

## 2019-11-24 MED ORDER — EZETIMIBE 10 MG PO TABS: 10.0000 mg | ORAL_TABLET | Freq: Every day | ORAL | 3 refills | Status: DC

## 2019-11-24 MED ORDER — VITAMIN D (ERGOCALCIFEROL) 1.25 MG (50000 UNIT) PO CAPS: 50000.0000 [IU] | ORAL_CAPSULE | ORAL | 0 refills | Status: DC

## 2019-11-24 MED ORDER — ATORVASTATIN CALCIUM 10 MG PO TABS: 10.0000 mg | ORAL_TABLET | Freq: Every day | ORAL | 3 refills | Status: DC

## 2019-11-24 NOTE — Patient Instructions (Signed)
Thank you allowing the Chronic Care Management Team to be a part of your care! It was a pleasure speaking with you today!     CCM (Chronic Care Management) Team    Noreene Larsson RN, MSN, CCM Nurse Care Coordinator  [phone]   Harlow Asa PharmD  Clinical Pharmacist  [phone]   Eula Fried LCSW Clinical Social Worker [phone]  Visit Information  Goals Addressed            This Visit's Progress   . PharmD - Medication Assistance       CARE PLAN ENTRY (see longitudinal plan of care for additional care plan information)   Current Barriers:  . Chronic Disease Management support, education, and care coordination needs related to HTN, HLD, fibromyalgia, chronic pain and Vitamin D deficiency  . Financial Barriers  Pharmacist Clinical Goal(s):  Marland Kitchen Over the next 30 days, patient will work with CM Pharmacist to address needs related to medication assistance and complete medication review  Interventions: . Inter-disciplinary care team collaboration (see longitudinal plan of care) . Perform chart review. Note patient seen by PCP on 8/13 for office visit and lab work ? PCP advised patient: ? To restart Vitamin D supplement for an additional 12 weeks (Rx sent to pharmacy) ? To add aspirin 81 mg daily and start atorvastatin 10 mg daily (Rx sent to pharmacy). . Follow up with patient.  ? Patient confirms received message from PCP and plans to pick up and resume Vitamin D supplement. . Discuss importance of LDL lowering ? Reports has been taking pravastatin 20 mg daily. ? Reports rarely misses doses of pravastatin ? States does not want to increase/intensify statin regimen due to concern that it would cause her pain.  . Follow up regarding chronic use of ibuprofen ? Again encourage patient to limit NSAID use, only using as needed ? Note previously discussed risks associated with chronic use of ibuprofen 800 mg three times daily. Patient reports uses as needed, but  often needing three times daily when pain present ? Patient currently on daily PPI, esomeprazole (GI protection) ? Note KDIGO guidelines recommend avoidance of prolonged use of NSAIDs in patients with eGFR 30 to 60 mL/minute/1.73 m2, particularly in patients with intercurrent disease that increases risk of acute kidney injury. ? Patient's latest eGFR ~ 50.  ? Patient asks for confirmation from provider on whether she should start aspirin 81 mg daily while taking chronic ibuprofen . Follow up regarding status of extra help application/medication assistance needs ? Note previously reported re-applied for extra help ~beginning of April online ? Denies having yet received reply in mail for approval/denial.  ? Again advise patient to follow up with Social Security regarding status OR can also check for extra help subsidy status via Medicare.gov website. . Follow up regarding smoking cessation ? Reports currently smoking ~3-4 cigarettes/day ? Reports not ready to quit today ? Have encouraged patient to contact [place] Quitline resource . Collaborate with PCP ? Recommend addition of ezetimibe to pravastatin 20 mg daily for further LDL lowering as patient declines intensification of statin therapy. Provider agrees.  ? Rx for ezetimibe sent to pharmacy.  ? Provider discontinues Rx for atorvastatin. ? Discuss patient's pain control, ibuprofen use and renal impairment ? Provider states will consider pain specialist referral with patient. ? Confirms advises patient to start aspirin 81 mg daily . Call to follow up with patient. Unable to reach Ms. Such. Leave message requesting call back. Roney Marion with [facility]  RPh to advise atorvastatin discontinued by provider.  Patient Self Care Activities:  . Attends all scheduled provider appointments . Calls pharmacy for medication refills . Calls provider office for new concerns or questions   Please see past updates related to this goal by  clicking on the "Past Updates" button in the selected goal         Patient verbalizes understanding of instructions provided today.   The care management team will reach out to the patient again over the next 14 days.   Harlow Asa, PharmD, [place] [employer] [phone]

## 2019-11-24 NOTE — Chronic Care Management (AMB) (Signed)
Chronic Care Management   Follow Up Note   11/24/2019 Name: Nardos Putnam MRN: [id number] DOB: Feb 25, 1956  Referred by: Tarri Fuller, FNP Reason for referral : Chronic Care Management (Patient Phone Call)   Graceanne Guin is a 64 y.o. year old female who is a primary care patient of Anitra Lauth, Jodelle Gross, FNP. The CCM team was consulted for assistance with chronic disease management and care coordination needs.    I reached out to [employer] by phone today.   Collaborate with PCP.  Attempt to reach patient to follow up. Leave a HIPAA compliant voicemail asking patient to return my call.    Review of patient status, including review of consultants reports, relevant laboratory and other test results, and collaboration with appropriate care team members and the patient's provider was performed as part of comprehensive patient evaluation and provision of chronic care management services.    Objective  Lab Results  Component Value Date   CREATININE 1.17 (H) 11/21/2019   BUN 20 11/21/2019   NA 138 11/21/2019   K 5.0 11/21/2019   CL 106 11/21/2019   CO2 22 11/21/2019   Lab Results  Component Value Date   CHOL 259 (H) 11/21/2019   HDL 56 11/21/2019   LDLCALC 174 (H) 11/21/2019   TRIG 147 11/21/2019   CHOLHDL 4.6 11/21/2019   ASCVD Risk: The 10-year ASCVD risk score Denman George DC Jr., et al., 2013) is: 13.3%   Values used to calculate the score:     Age: 13 years     Sex: Female     Is Non-Hispanic African American: No     Diabetic: No     Tobacco smoker: Yes     Systolic Blood Pressure: 128 mmHg     Is BP treated: Yes     HDL Cholesterol: 56 mg/dL     Total Cholesterol: 259 mg/dL    Outpatient Encounter Medications as of 11/24/2019  Medication Sig  . pravastatin (PRAVACHOL) 20 MG tablet Take 1 tablet (20 mg total) by mouth daily.  Marland Kitchen albuterol (PROVENTIL HFA;VENTOLIN HFA) 108 (90 Base) MCG/ACT inhaler Inhale 2 puffs into the lungs every 4 (four)  hours as needed for wheezing or shortness of breath.  Marland Kitchen azelastine (ASTELIN) 0.1 % nasal spray Place 2 sprays into both nostrils 2 (two) times daily as needed for rhinitis. Use in each nostril as directed  . busPIRone (BUSPAR) 10 MG tablet Take 1 tablet (10 mg total) by mouth 3 (three) times daily.  . DULoxetine (CYMBALTA) 60 MG capsule TAKE 1 CAPSULE EVERY DAY  . EPINEPHrine (EPIPEN 2-PAK) 0.3 mg/0.3 mL IJ SOAJ injection Inject 0.3 mLs (0.3 mg total) into the muscle once for 1 dose.  . esomeprazole (NEXIUM) 40 MG capsule TAKE 1 CAPSULE EVERY DAY  . fluticasone (FLONASE) 50 MCG/ACT nasal spray Place 2 sprays into both nostrils daily.  . IBU 800 MG tablet TAKE ONE TABLET 3 TIMES DAILY  . lisinopril (ZESTRIL) 10 MG tablet Take 1 tablet (10 mg total) by mouth daily.  . meclizine (ANTIVERT) 25 MG tablet Take 1 tablet (25 mg total) by mouth 3 (three) times daily as needed for dizziness.  . montelukast (SINGULAIR) 10 MG tablet Take 1 tablet (10 mg total) by mouth at bedtime.  . Multiple Vitamins-Minerals (PRESERVISION AREDS 2 PO) Take by mouth.  Marland Kitchen tiZANidine (ZANAFLEX) 4 MG tablet TAKE ONE TABLET FOUR TIMES DAILY AS NEEDED FOR MUSCLE SPASM  . Vitamin D, Ergocalciferol, (DRISDOL) 1.25 MG (50000 UNIT)  CAPS capsule Take 1 capsule (50,000 Units total) by mouth every 7 (seven) days.  . [DISCONTINUED] atorvastatin (LIPITOR) 10 MG tablet Take 1 tablet (10 mg total) by mouth daily.  . [DISCONTINUED] cholecalciferol (VITAMIN D) 1000 units tablet Take 1,000 Units by mouth daily.  . [DISCONTINUED] Vitamin D, Ergocalciferol, (DRISDOL) 1.25 MG (50000 UNIT) CAPS capsule Take 1 capsule (50,000 Units total) by mouth every 7 (seven) days.   No facility-administered encounter medications on file as of 11/24/2019.    Goals Addressed            This Visit's Progress   . PharmD - Medication Assistance       CARE PLAN ENTRY (see longitudinal plan of care for additional care plan information)   Current Barriers:    . Chronic Disease Management support, education, and care coordination needs related to HTN, HLD, fibromyalgia, chronic pain and Vitamin D deficiency  . Financial Barriers  Pharmacist Clinical Goal(s):  Marland Kitchen Over the next 30 days, patient will work with CM Pharmacist to address needs related to medication assistance and complete medication review  Interventions: . Inter-disciplinary care team collaboration (see longitudinal plan of care) . Perform chart review. Note patient seen by PCP on 8/13 for office visit and lab work ? PCP advised patient: ? To restart Vitamin D supplement for an additional 12 weeks (Rx sent to pharmacy) ? To add aspirin 81 mg daily and start atorvastatin 10 mg daily (Rx sent to pharmacy). . Follow up with patient.  ? Patient confirms received message from PCP and plans to pick up and resume Vitamin D supplement. . Discuss importance of LDL lowering ? Reports has been taking pravastatin 20 mg daily. ? Reports rarely misses doses of pravastatin ? States does not want to increase/intensify statin regimen due to concern that it would cause her pain.  . Follow up regarding chronic use of ibuprofen ? Again encourage patient to limit NSAID use, only using as needed ? Note previously discussed risks associated with chronic use of ibuprofen 800 mg three times daily. Patient reports uses as needed, but often needing three times daily when pain present ? Patient currently on daily PPI, esomeprazole (GI protection) ? Note KDIGO guidelines recommend avoidance of prolonged use of NSAIDs in patients with eGFR 30 to 60 mL/minute/1.73 m2, particularly in patients with intercurrent disease that increases risk of acute kidney injury. ? Patient's latest eGFR ~ 50.  ? Patient asks for confirmation from provider on whether she should start aspirin 81 mg daily while taking chronic ibuprofen . Follow up regarding status of extra help application/medication assistance needs ? Note previously  reported re-applied for extra help ~beginning of April online ? Denies having yet received reply in mail for approval/denial.  ? Again advise patient to follow up with Social Security regarding status OR can also check for extra help subsidy status via Medicare.gov website. . Follow up regarding smoking cessation ? Reports currently smoking ~3-4 cigarettes/day ? Reports not ready to quit today ? Have encouraged patient to contact [place] Quitline resource . Collaborate with PCP ? Recommend addition of ezetimibe to pravastatin 20 mg daily for further LDL lowering as patient declines intensification of statin therapy. Provider agrees.  ? Rx for ezetimibe sent to pharmacy.  ? Provider discontinues Rx for atorvastatin. ? Discuss patient's pain control, ibuprofen use and renal impairment ? Provider states will consider pain specialist referral with patient. ? Confirms advises patient to start aspirin 81 mg daily . Call to follow up with  patient. Unable to reach Ms. Shiffer. Leave message requesting call back. Roney Marion with [facility] RPh to advise atorvastatin discontinued by provider.  Patient Self Care Activities:  . Attends all scheduled provider appointments . Calls pharmacy for medication refills . Calls provider office for new concerns or questions   Please see past updates related to this goal by clicking on the "Past Updates" button in the selected goal         Plan  The care management team will reach out to the patient again over the next 14 days.   Harlow Asa, PharmD, [place] [employer] [phone]

## 2019-11-26 ENCOUNTER — Ambulatory Visit: Payer: Self-pay | Admitting: Pharmacist

## 2019-11-26 DIAGNOSIS — E782 Mixed hyperlipidemia: Secondary | ICD-10-CM

## 2019-11-26 DIAGNOSIS — M797 Fibromyalgia: Secondary | ICD-10-CM

## 2019-11-26 NOTE — Chronic Care Management (AMB) (Signed)
Chronic Care Management   Follow Up Note   11/26/2019 Name: April Price MRN: [id number] DOB: [birth year]  Referred by: April Bangs, FNP Reason for referral : Chronic Care Management (Patient Phone Call)   April Price is a 64 y.o. year old female who is a primary care patient of April Price, April Price, [place]. The CCM team was consulted for assistance with chronic disease management and care coordination needs.    I reached out to April Price by phone today.   Review of patient status, including review of consultants reports, relevant laboratory and other test results, and collaboration with appropriate care team members and the patient's provider was performed as part of comprehensive patient evaluation and provision of chronic care management services.     Outpatient Encounter Medications as of 11/26/2019  Medication Sig  . albuterol (PROVENTIL HFA;VENTOLIN HFA) 108 (90 Base) MCG/ACT inhaler Inhale 2 puffs into the lungs every 4 (four) hours as needed for wheezing or shortness of breath.  Marland Kitchen azelastine (ASTELIN) 0.1 % nasal spray Place 2 sprays into both nostrils 2 (two) times daily as needed for rhinitis. Use in each nostril as directed  . busPIRone (BUSPAR) 10 MG tablet Take 1 tablet (10 mg total) by mouth 3 (three) times daily.  . DULoxetine (CYMBALTA) 60 MG capsule TAKE 1 CAPSULE EVERY DAY  . EPINEPHrine (EPIPEN 2-PAK) 0.3 mg/0.3 mL IJ SOAJ injection Inject 0.3 mLs (0.3 mg total) into the muscle once for 1 dose.  . esomeprazole (NEXIUM) 40 MG capsule TAKE 1 CAPSULE EVERY DAY  . ezetimibe (ZETIA) 10 MG tablet Take 1 tablet (10 mg total) by mouth daily.  . fluticasone (FLONASE) 50 MCG/ACT nasal spray Place 2 sprays into both nostrils daily.  . IBU 800 MG tablet TAKE ONE TABLET 3 TIMES DAILY  . lisinopril (ZESTRIL) 10 MG tablet Take 1 tablet (10 mg total) by mouth daily.  . meclizine (ANTIVERT) 25 MG tablet Take 1 tablet (25 mg total) by mouth 3 (three) times  daily as needed for dizziness.  . montelukast (SINGULAIR) 10 MG tablet Take 1 tablet (10 mg total) by mouth at bedtime.  . Multiple Vitamins-Minerals (PRESERVISION AREDS 2 PO) Take by mouth.  . pravastatin (PRAVACHOL) 20 MG tablet Take 1 tablet (20 mg total) by mouth daily.  Marland Kitchen tiZANidine (ZANAFLEX) 4 MG tablet TAKE ONE TABLET FOUR TIMES DAILY AS NEEDED FOR MUSCLE SPASM  . Vitamin D, Ergocalciferol, (DRISDOL) 1.25 MG (50000 UNIT) CAPS capsule Take 1 capsule (50,000 Units total) by mouth every 7 (seven) days.   No facility-administered encounter medications on file as of 11/26/2019.    Goals Addressed            This Visit's Progress   . PharmD - Medication Assistance       CARE PLAN ENTRY (see longitudinal plan of care for additional care plan information)   Current Barriers:  . Chronic Disease Management support, education, and care coordination needs related to HTN, HLD, fibromyalgia, chronic pain and Vitamin D deficiency  . Financial Barriers  Pharmacist Clinical Goal(s):  Marland Kitchen Over the next 30 days, patient will work with CM Pharmacist to address needs related to medication assistance and complete medication review  Interventions: . Inter-disciplinary care team collaboration (see longitudinal plan of care) . Collaborated with PCP on 8/16 ? Recommend addition of ezetimibe to pravastatin 20 mg daily for further LDL lowering as patient declines intensification of statin therapy. Provider agrees.  ? Rx for ezetimibe sent to pharmacy.  ?  Provider discontinues Rx for atorvastatin. ? Discuss patient's pain control, ibuprofen use and renal impairment ? Provider states will consider pain specialist referral with patient. ? Confirms advises patient to start aspirin 81 mg daily . Follow up with patient today ? Counsel on addition of ezetimibe to pravastatin for further LDL lowering. ? Patient confirms will pick up ezetimibe Rx (as well as Vitamin D Rx) from pharmacy ? Again encourage  patient to limit NSAID use, only using as needed ? Counsel on use of topical diclofenac gel as alternative to oral NSAID for pain relief. ? April Price reports she has tried diclofenac gel in past, but with limited success due to how many areas of body affected by fibromyalgia pain ? Encourage patient to follow up with PCP for further discussion/as needed for worsening pain ? Advise patient confirmed with PCP- provider advises patient to start aspirin 81 mg daily for ASCVD prevention. . Again encourage patient to follow up with Social Security regarding extra help/LIS application/status . Counsel patient on [employer] for medication assistance ? Note Hypercholesterolemia - Medicare April Price currently open. Provide patient with phone number to apply for program  Patient Self Care Activities:  . Attends all scheduled provider appointments . Calls pharmacy for medication refills . Calls provider office for new concerns or questions   Please see past updates related to this goal by clicking on the "Past Updates" button in the selected goal         Plan  Telephone follow up appointment with care management team member scheduled for: 10/27 at [place], PharmD, [place] [phone]

## 2019-11-26 NOTE — Patient Instructions (Signed)
Thank you allowing the Chronic Care Management Team to be a part of your care! It was a pleasure speaking with you today!     CCM (Chronic Care Management) Team    Noreene Larsson RN, MSN, CCM Nurse Care Coordinator  [phone]   Harlow Asa PharmD  Clinical Pharmacist  [phone]   Eula Fried LCSW Clinical Social Worker [phone]  Visit Information  Goals Addressed            This Visit's Progress   . PharmD - Medication Assistance       CARE PLAN ENTRY (see longitudinal plan of care for additional care plan information)   Current Barriers:  . Chronic Disease Management support, education, and care coordination needs related to HTN, HLD, fibromyalgia, chronic pain and Vitamin D deficiency  . Financial Barriers  Pharmacist Clinical Goal(s):  Marland Kitchen Over the next 30 days, patient will work with CM Pharmacist to address needs related to medication assistance and complete medication review  Interventions: . Inter-disciplinary care team collaboration (see longitudinal plan of care) . Collaborated with PCP on 8/16 ? Recommend addition of ezetimibe to pravastatin 20 mg daily for further LDL lowering as patient declines intensification of statin therapy. Provider agrees.  ? Rx for ezetimibe sent to pharmacy.  ? Provider discontinues Rx for atorvastatin. ? Discuss patient's pain control, ibuprofen use and renal impairment ? Provider states will consider pain specialist referral with patient. ? Confirms advises patient to start aspirin 81 mg daily . Follow up with patient today ? Counsel on addition of ezetimibe to pravastatin for further LDL lowering. ? Patient confirms will pick up ezetimibe Rx (as well as Vitamin D Rx) from pharmacy ? Again encourage patient to limit NSAID use, only using as needed ? Counsel on use of topical diclofenac gel as alternative to oral NSAID for pain relief. ? Ms. Ow reports she has tried diclofenac gel in past, but with limited  success due to how many areas of body affected by fibromyalgia pain ? Encourage patient to follow up with PCP for further discussion/as needed for worsening pain ? Advise patient confirmed with PCP- provider advises patient to start aspirin 81 mg daily for ASCVD prevention. . Again encourage patient to follow up with Social Security regarding extra help/LIS application/status . Counsel patient on [employer] for medication assistance ? Note Hypercholesterolemia - Medicare [employer] currently open. Provide patient with phone number to apply for program  Patient Self Care Activities:  . Attends all scheduled provider appointments . Calls pharmacy for medication refills . Calls provider office for new concerns or questions   Please see past updates related to this goal by clicking on the "Past Updates" button in the selected goal         Patient verbalizes understanding of instructions provided today.   Telephone follow up appointment with care management team member scheduled for: 10/27 at [place], PharmD, [place] [phone]

## 2019-11-27 ENCOUNTER — Ambulatory Visit: Payer: Self-pay | Admitting: Licensed Clinical Social Worker

## 2019-11-27 DIAGNOSIS — I10 Essential (primary) hypertension: Secondary | ICD-10-CM | POA: Diagnosis not present

## 2019-11-27 DIAGNOSIS — F321 Major depressive disorder, single episode, moderate: Secondary | ICD-10-CM | POA: Diagnosis not present

## 2019-11-27 DIAGNOSIS — E782 Mixed hyperlipidemia: Secondary | ICD-10-CM

## 2019-11-27 DIAGNOSIS — M797 Fibromyalgia: Secondary | ICD-10-CM

## 2019-11-27 DIAGNOSIS — E785 Hyperlipidemia, unspecified: Secondary | ICD-10-CM

## 2019-11-27 NOTE — Chronic Care Management (AMB) (Signed)
Chronic Care Management    Clinical Social Work Follow Up Note  11/27/2019 Name: April Price MRN: [id number] DOB: 04-Jun-1955  April Price is a 64 y.o. year old female who is a primary care patient of Lorine Bears, Lupita Raider, FNP. The CCM team was consulted for assistance with [employer]  and [employer] and Resources.   Review of patient status, including review of consultants reports, other relevant assessments, and collaboration with appropriate care team members and the patient's provider was performed as part of comprehensive patient evaluation and provision of chronic care management services.    SDOH (Social Determinants of Health) assessments performed: Yes    Outpatient Encounter Medications as of 11/27/2019  Medication Sig  . albuterol (PROVENTIL HFA;VENTOLIN HFA) 108 (90 Base) MCG/ACT inhaler Inhale 2 puffs into the lungs every 4 (four) hours as needed for wheezing or shortness of breath.  Marland Kitchen azelastine (ASTELIN) 0.1 % nasal spray Place 2 sprays into both nostrils 2 (two) times daily as needed for rhinitis. Use in each nostril as directed  . busPIRone (BUSPAR) 10 MG tablet Take 1 tablet (10 mg total) by mouth 3 (three) times daily.  . DULoxetine (CYMBALTA) 60 MG capsule TAKE 1 CAPSULE EVERY DAY  . EPINEPHrine (EPIPEN 2-PAK) 0.3 mg/0.3 mL IJ SOAJ injection Inject 0.3 mLs (0.3 mg total) into the muscle once for 1 dose.  . esomeprazole (NEXIUM) 40 MG capsule TAKE 1 CAPSULE EVERY DAY  . ezetimibe (ZETIA) 10 MG tablet Take 1 tablet (10 mg total) by mouth daily.  . fluticasone (FLONASE) 50 MCG/ACT nasal spray Place 2 sprays into both nostrils daily.  . IBU 800 MG tablet TAKE ONE TABLET 3 TIMES DAILY  . lisinopril (ZESTRIL) 10 MG tablet Take 1 tablet (10 mg total) by mouth daily.  . meclizine (ANTIVERT) 25 MG tablet Take 1 tablet (25 mg total) by mouth 3 (three) times daily as needed for dizziness.  . montelukast (SINGULAIR) 10 MG tablet Take 1 tablet (10  mg total) by mouth at bedtime.  . Multiple Vitamins-Minerals (PRESERVISION AREDS 2 PO) Take by mouth.  . pravastatin (PRAVACHOL) 20 MG tablet Take 1 tablet (20 mg total) by mouth daily.  Marland Kitchen tiZANidine (ZANAFLEX) 4 MG tablet TAKE ONE TABLET FOUR TIMES DAILY AS NEEDED FOR MUSCLE SPASM  . Vitamin D, Ergocalciferol, (DRISDOL) 1.25 MG (50000 UNIT) CAPS capsule Take 1 capsule (50,000 Units total) by mouth every 7 (seven) days.   No facility-administered encounter medications on file as of 11/27/2019.     Goals Addressed    .  "I need help with managing my chronic pain and depression." (pt-stated)        Current Barriers:  . Chronic Mental Health needs related to pain and major depressive disorder . Financial constraints related to managing health care expenses . Limited social support . ADL IADL limitations . Mental Health Concerns  . Limited access to caregiver . Suicidal Ideation/Homicidal Ideation: No  Clinical Social Work Goal(s):  Marland Kitchen Over the next 120 days, patient will work with SW  bi-monthly  by telephone or in person to reduce or manage symptoms related to chronic pain and depression . Over the next 120 days, patient will work with SW to address concerns related to ongoing stress, isolation, pain and depressive symptoms  Interventions: . Patient interviewed and appropriate assessments performed: brief mental health assessment . Patient reports ongoing chronic pain and has not been able to find any relief. Patient is interested in a possible rheumatologist referral. Patient  reports that she met new PCP and received trigger point injections that have been helpful with managing her pain. Patient reports that her pain is currently manageable.  . Patient completed both COVID vaccinations. Patient reports that her family member has COVID right now and she is taking appropriate precautions to protect herself.  Marland Kitchen LCSW discussed coping skills for anxiety. SW used empathetic and active and  reflective listening, validated patient's feelings/concerns, and provided emotional support. LCSW provided self-care education to help manage her multiple health conditions and improve her mood.  . Provided patient with information about available mental health support resources within the area . Discussed plans with patient for ongoing care management follow up and provided patient with direct contact information for care management team . Advised patient to contact CCM Social Worker for urgent mental health needs/case management needs . Assisted patient/caregiver with obtaining information about health plan benefits . A voluntary and extensive discussion about advanced care planning including explanation and discussion of advanced was undertaken with the patient.  Explanation regarding healthcare proxy and living will was reviewed. . Patient admits that she could be more active. Healthy self-care education provided. Patient does not wish to return to the [facility] until COVID has calmed down. . Provided education to patient/caregiver about Hospice and/or Palliative Care services . Brief CBT provided throughout entire session. Patient does not want psychiatric referral at this time.  . Patient reports that her blood pressure has been good lately and that she keeps track of her blood pressure log daily.  . Patient reports that she does not need a steroid injection as her pain is very manageable at this time.  . Patient reports that the weather affects her pain and she has been able to be more active with the nice weather and limited chronic pain.  . Patient reports concern regarding billing for CCM Services. LCSW provided education centered around [facility] management and our services. Patient prefers 90 day follow up calls from entire team due to limited finances. LCSW updated entire CCM team and PCP.   Patient Self Care Activities:  . Ability for insight . Motivation for treatment . Strong family or social  support  Patient Coping Strengths:  . Hopefulness . Self Advocate . Able to Communicate Effectively  Patient Self Care Deficits:  . Lacks social connections  Please see past updates related to this goal by clicking on the "Past Updates" button in the selected goal       Follow Up Plan: SW will follow up with patient by phone over the next quarter  Eula Fried, [place], MSW, [email or web address] Phone: [phone]

## 2019-12-01 ENCOUNTER — Telehealth: Payer: Self-pay

## 2020-01-05 ENCOUNTER — Telehealth: Payer: Self-pay | Admitting: General Practice

## 2020-01-05 ENCOUNTER — Telehealth: Payer: Self-pay

## 2020-01-05 NOTE — Telephone Encounter (Signed)
  Chronic Care Management   Outreach Note  01/05/2020 Name: April Price MRN: [id number] DOB: 1955/10/26  Referred by: Verl Bangs, FNP Reason for referral : Chronic Care Management (RNCM Follow up call for Chronic Disease Management and Care Coordination Needs)   An unsuccessful telephone outreach was attempted today. The patient was referred to the case management team for assistance with care management and care coordination.   Follow Up Plan: A HIPAA compliant phone message was left for the patient providing contact information and requesting a return call.   Noreene Larsson RN, MSN, [place] [facility] Mobile: [phone]

## 2020-01-07 NOTE — Telephone Encounter (Signed)
April Price   I spoke with Pt to r/s f/u appt and she states that she still has not gotten the bill resolved. Pt states that they are telling her that the provider that she spoke with billed for extra time. Pt is r/s for December in hopes that bill is resolved by then.   Thank you  Noreene Larsson

## 2020-01-14 DIAGNOSIS — H26491 Other secondary cataract, right eye: Secondary | ICD-10-CM | POA: Diagnosis not present

## 2020-01-14 DIAGNOSIS — H353131 Nonexudative age-related macular degeneration, bilateral, early dry stage: Secondary | ICD-10-CM | POA: Diagnosis not present

## 2020-01-16 ENCOUNTER — Other Ambulatory Visit: Payer: Self-pay | Admitting: Family Medicine

## 2020-01-16 DIAGNOSIS — I10 Essential (primary) hypertension: Secondary | ICD-10-CM

## 2020-01-16 DIAGNOSIS — G894 Chronic pain syndrome: Secondary | ICD-10-CM

## 2020-01-16 MED ORDER — LISINOPRIL 10 MG PO TABS: 10.0000 mg | ORAL_TABLET | Freq: Every day | ORAL | 1 refills | Status: DC

## 2020-01-16 NOTE — Telephone Encounter (Signed)
Requested medication (s) are due for refill today: yes  Requested medication (s) are on the active medication list: yes  Last refill:  10/14/19  Future visit scheduled: yes  Notes to clinic:  per pharmacy new script needed    Requested Prescriptions  Pending Prescriptions Disp Refills   IBU 800 MG tablet [Pharmacy Med Name: IBUPROFEN 800 MG TAB] 270 tablet 0    Sig: TAKE ONE TABLET 3 TIMES DAILY      Analgesics:  NSAIDS Failed - 01/16/2020  9:13 AM      Failed - Cr in normal range and within 360 days    Creat  Date Value Ref Range Status  11/21/2019 1.17 (H) 0.50 - 0.99 mg/dL Final    Comment:    For patients >47 years of age, the reference limit for Creatinine is approximately 13% higher for people identified as African-American. .           Failed - HGB in normal range and within 360 days    Hemoglobin  Date Value Ref Range Status  07/25/2019 17.5 (H) 11.7 - 15.5 g/dL Final          Passed - Patient is not pregnant      Passed - Valid encounter within last 12 months    Recent Outpatient Visits           1 month ago Essential hypertension   [facility], Lupita Raider, FNP   4 months ago Arthralgia, unspecified joint   [facility], Lupita Raider, FNP   5 months ago Essential hypertension   [facility], Lupita Raider, FNP   6 months ago Essential hypertension   [facility], Lupita Raider, FNP   7 months ago Chronic hip pain (Location of Secondary source of pain) (Bilateral) (L>R)   [place], DO       Future Appointments             In 1 month [place], Lupita Raider, [facility], [facility]              lisinopril (ZESTRIL) 10 MG tablet [Pharmacy Med Name: LISINOPRIL 10 MG TAB] 90 tablet 1    Sig: TAKE 1 TABLET BY MOUTH DAILY      Cardiovascular:  ACE Inhibitors Failed - 01/16/2020  9:13 AM      Failed - Cr in normal range and within  180 days    Creat  Date Value Ref Range Status  11/21/2019 1.17 (H) 0.50 - 0.99 mg/dL Final    Comment:    For patients >34 years of age, the reference limit for Creatinine is approximately 13% higher for people identified as African-American. .           Passed - K in normal range and within 180 days    Potassium  Date Value Ref Range Status  11/21/2019 5.0 3.5 - 5.3 mmol/L Final          Passed - Patient is not pregnant      Passed - Last BP in normal range    BP Readings from Last 1 Encounters:  11/21/19 128/74          Passed - Valid encounter within last 6 months    Recent Outpatient Visits           1 month ago Essential hypertension   [facility] [place], [place],  FNP   4 months ago Arthralgia, unspecified joint   [facility], Lupita Raider, FNP   5 months ago Essential hypertension   [place], FNP   6 months ago Essential hypertension   [facility], Lupita Raider, FNP   7 months ago Chronic hip pain (Location of Secondary source of pain) (Bilateral) (L>R)   [facility] Olin Hauser, DO       Future Appointments             In 1 month [place], Lupita Raider, [facility], [facility]

## 2020-01-21 ENCOUNTER — Other Ambulatory Visit: Payer: Self-pay | Admitting: Family Medicine

## 2020-01-21 NOTE — Telephone Encounter (Signed)
Pt called requesting refill on 800 mg Ibuprofen  3 x a day as needed 90 day supply [facility]

## 2020-01-21 NOTE — Telephone Encounter (Signed)
Requested medication (s) are due for refill today: yes  Requested medication (s) are on the active medication list: no  Last refill:  ?  Future visit scheduled: yes  Notes to clinic:  not on active med list   Requested Prescriptions  Pending Prescriptions Disp Refills   IBU 800 MG tablet [Pharmacy Med Name: IBUPROFEN 800 MG TAB] 270 tablet     Sig: TAKE ONE TABLET 3 TIMES DAILY      Analgesics:  NSAIDS Failed - 01/21/2020 12:49 PM      Failed - Cr in normal range and within 360 days    Creat  Date Value Ref Range Status  11/21/2019 1.17 (H) 0.50 - 0.99 mg/dL Final    Comment:    For patients >59 years of age, the reference limit for Creatinine is approximately 13% higher for people identified as African-American. .           Failed - HGB in normal range and within 360 days    Hemoglobin  Date Value Ref Range Status  07/25/2019 17.5 (H) 11.7 - 15.5 g/dL Final          Passed - Patient is not pregnant      Passed - Valid encounter within last 12 months    Recent Outpatient Visits           2 months ago Essential hypertension   [facility], Lupita Raider, FNP   5 months ago Arthralgia, unspecified joint   [facility], Lupita Raider, FNP   6 months ago Essential hypertension   [facility], Lupita Raider, FNP   6 months ago Essential hypertension   [facility], Lupita Raider, FNP   7 months ago Chronic hip pain (Location of Secondary source of pain) (Bilateral) (L>R)   [facility] Olin Hauser, DO       Future Appointments             In 1 month [place], Lupita Raider, [facility], [facility]

## 2020-01-22 ENCOUNTER — Telehealth: Payer: Self-pay

## 2020-01-22 NOTE — Telephone Encounter (Signed)
Attempted to contact the patient, no answer. LMOM to return my call.

## 2020-01-22 NOTE — Telephone Encounter (Signed)
The pt declined the referral to pain management at this time. She said she currently sees a pain management provider in [place], but admit its been about 2 yrs since she seen them last. She said she will reach out to them and if they require a referral then she will call us back.

## 2020-01-22 NOTE — Telephone Encounter (Signed)
The pt was notified that the ibuprofen was denied because of her kidney function decrease. She wanted to know what can she take in place of the ibuprofen for her fibromyalgia.

## 2020-01-22 NOTE — Telephone Encounter (Signed)
We would typically recommend cyclobenzaprine but she is currently taking tizanidine and it works similarly.  She is allergic to gabapentin and noted on her chart that lyrica made her feet and legs swell.  I would encourage a referral to pain management to see if they have additional resources to help with this.

## 2020-01-22 NOTE — Telephone Encounter (Signed)
Copied from [place] [id number]. Topic: General - Call Back - No Documentation >> Jan 22, 2020  3:14 PM Hinda Lenis D wrote: PT returning call   Please see previous message.

## 2020-02-04 ENCOUNTER — Telehealth: Payer: Self-pay

## 2020-02-09 DIAGNOSIS — Z1211 Encounter for screening for malignant neoplasm of colon: Secondary | ICD-10-CM | POA: Diagnosis not present

## 2020-02-09 LAB — COLOGUARD: Cologuard: POSITIVE — AB

## 2020-02-10 DIAGNOSIS — M461 Sacroiliitis, not elsewhere classified: Secondary | ICD-10-CM | POA: Diagnosis not present

## 2020-02-10 DIAGNOSIS — M7062 Trochanteric bursitis, left hip: Secondary | ICD-10-CM | POA: Diagnosis not present

## 2020-02-10 DIAGNOSIS — Z9889 Other specified postprocedural states: Secondary | ICD-10-CM | POA: Diagnosis not present

## 2020-02-10 DIAGNOSIS — M797 Fibromyalgia: Secondary | ICD-10-CM | POA: Diagnosis not present

## 2020-02-19 ENCOUNTER — Telehealth: Payer: Self-pay

## 2020-02-24 ENCOUNTER — Telehealth: Payer: Self-pay | Admitting: Family Medicine

## 2020-02-24 ENCOUNTER — Ambulatory Visit: Payer: PPO | Admitting: Family Medicine

## 2020-02-24 NOTE — Telephone Encounter (Signed)
Telephone call to April Price to review her cologuard results.  Left message, requesting call back.  *Cologuard positive*  Will wait to speak with patient but will need referral to GI for colonoscopy.

## 2020-02-25 NOTE — Telephone Encounter (Signed)
Left message, requested call back

## 2020-02-25 NOTE — Telephone Encounter (Signed)
Can you send her a letter to call the office?

## 2020-02-26 ENCOUNTER — Encounter: Payer: Self-pay | Admitting: Family Medicine

## 2020-02-26 ENCOUNTER — Ambulatory Visit: Payer: PPO | Admitting: Licensed Clinical Social Worker

## 2020-02-26 DIAGNOSIS — M797 Fibromyalgia: Secondary | ICD-10-CM

## 2020-02-26 DIAGNOSIS — I10 Essential (primary) hypertension: Secondary | ICD-10-CM

## 2020-02-26 DIAGNOSIS — E785 Hyperlipidemia, unspecified: Secondary | ICD-10-CM

## 2020-02-26 DIAGNOSIS — F321 Major depressive disorder, single episode, moderate: Secondary | ICD-10-CM

## 2020-02-26 NOTE — Chronic Care Management (AMB) (Signed)
Chronic Care Management    Clinical Social Work Follow Up Note  02/26/2020 Name: April Price MRN: [id number] DOB: 08-07-1955  April Price is a 64 y.o. year old female who is a primary care patient of Lorine Bears, Lupita Raider, FNP. The CCM team was consulted for assistance with [employer]  and [employer].   Review of patient status, including review of consultants reports, other relevant assessments, and collaboration with appropriate care team members and the patient's provider was performed as part of comprehensive patient evaluation and provision of chronic care management services.    SDOH (Social Determinants of Health) assessments performed: Yes    Outpatient Encounter Medications as of 02/26/2020  Medication Sig  . albuterol (PROVENTIL HFA;VENTOLIN HFA) 108 (90 Base) MCG/ACT inhaler Inhale 2 puffs into the lungs every 4 (four) hours as needed for wheezing or shortness of breath.  Marland Kitchen azelastine (ASTELIN) 0.1 % nasal spray Place 2 sprays into both nostrils 2 (two) times daily as needed for rhinitis. Use in each nostril as directed  . busPIRone (BUSPAR) 10 MG tablet Take 1 tablet (10 mg total) by mouth 3 (three) times daily.  . DULoxetine (CYMBALTA) 60 MG capsule TAKE 1 CAPSULE EVERY DAY  . EPINEPHrine (EPIPEN 2-PAK) 0.3 mg/0.3 mL IJ SOAJ injection Inject 0.3 mLs (0.3 mg total) into the muscle once for 1 dose.  . esomeprazole (NEXIUM) 40 MG capsule TAKE 1 CAPSULE EVERY DAY  . ezetimibe (ZETIA) 10 MG tablet Take 1 tablet (10 mg total) by mouth daily.  . fluticasone (FLONASE) 50 MCG/ACT nasal spray Place 2 sprays into both nostrils daily.  Marland Kitchen lisinopril (ZESTRIL) 10 MG tablet Take 1 tablet (10 mg total) by mouth daily.  . meclizine (ANTIVERT) 25 MG tablet Take 1 tablet (25 mg total) by mouth 3 (three) times daily as needed for dizziness.  . montelukast (SINGULAIR) 10 MG tablet Take 1 tablet (10 mg total) by mouth at bedtime.  . Multiple  Vitamins-Minerals (PRESERVISION AREDS 2 PO) Take by mouth.  . pravastatin (PRAVACHOL) 20 MG tablet Take 1 tablet (20 mg total) by mouth daily.  Marland Kitchen tiZANidine (ZANAFLEX) 4 MG tablet TAKE ONE TABLET FOUR TIMES DAILY AS NEEDED FOR MUSCLE SPASM  . Vitamin D, Ergocalciferol, (DRISDOL) 1.25 MG (50000 UNIT) CAPS capsule Take 1 capsule (50,000 Units total) by mouth every 7 (seven) days.   No facility-administered encounter medications on file as of 02/26/2020.     Goals Addressed              This Visit's Progress   .  "I need help with managing my chronic pain and depression." (pt-stated)        Current Barriers:  . Chronic Mental Health needs related to pain and major depressive disorder . Financial constraints related to managing health care expenses . Limited social support . ADL IADL limitations . Mental Health Concerns  . Limited access to caregiver . Suicidal Ideation/Homicidal Ideation: No  Clinical Social Work Goal(s):  Marland Kitchen Over the next 120 days, patient will work with SW  bi-monthly  by telephone or in person to reduce or manage symptoms related to chronic pain and depression . Over the next 120 days, patient will work with SW to address concerns related to ongoing stress, isolation, pain and depressive symptoms  Interventions: . Patient interviewed and appropriate assessments performed: brief mental health assessment . Patient reports ongoing chronic pain and has not been able to find any relief. Patient is interested in a possible  rheumatologist referral. Patient reports that she met new PCP and received trigger point injections that have been helpful with managing her pain. Patient reports that her pain is currently manageable.  . Patient completed both COVID vaccinations. Patient reports that her family member has COVID right now and she is taking appropriate precautions to protect herself. Patient shares that her brother in law has COVID this time (02/26/20) . LCSW discussed  coping skills for anxiety. SW used empathetic and active and reflective listening, validated patient's feelings/concerns, and provided emotional support. LCSW provided self-care education to help manage her multiple health conditions and improve her mood.  . Provided patient with information about available mental health support resources within the area . Discussed plans with patient for ongoing care management follow up and provided patient with direct contact information for care management team . Advised patient to contact CCM Social Worker for urgent mental health needs/case management needs . Assisted patient/caregiver with obtaining information about health plan benefits . A voluntary and extensive discussion about advanced care planning including explanation and discussion of advanced was undertaken with the patient.  Explanation regarding healthcare proxy and living will was reviewed. . Patient admits that she could be more active. Healthy self-care education provided. Patient does not wish to return to the [facility] until COVID has calmed down. . Provided education to patient/caregiver about Hospice and/or Palliative Care services . Brief CBT provided throughout entire session. Patient does not want psychiatric referral at this time.  . Patient reports that her blood pressure has been good lately and that she keeps track of her blood pressure log daily.  . Patient reports that she does not need a steroid injection as her pain is very manageable at this time.  . Patient reports that the weather affects her pain and she has been able to be more active with the nice weather and limited chronic pain.  . Patient reports concern regarding billing for CCM Services. LCSW provided education centered around [facility] management and our services. Patient prefers 90 day follow up calls from entire team due to limited finances. LCSW updated entire CCM team and PCP.  Marland Kitchen Patient shares that recently went to a new pain  doctor and will see them again on 03/08/20 for injections. She shares that her pain doctor found burn marks on her back from using her heating pad at night.  . Patient was upset that she was taken off of ibuprofen due to kidney side effects but shares that her pain doctor prescribed her tramadol for pain relief.  . Patient was positive for color guard but wishes to address this concern after the new year.   Patient Self Care Activities:  . Ability for insight . Motivation for treatment . Strong family or social support  Patient Coping Strengths:  . Hopefulness . Self Advocate . Able to Communicate Effectively  Patient Self Care Deficits:  . Lacks social connections  Please see past updates related to this goal by clicking on the "Past Updates" button in the selected goal       Follow Up Plan: SW will follow up with patient by phone over the next quarter  Eula Fried, [place], MSW, [email or web address] Phone: [phone]

## 2020-03-08 DIAGNOSIS — M461 Sacroiliitis, not elsewhere classified: Secondary | ICD-10-CM | POA: Diagnosis not present

## 2020-03-11 ENCOUNTER — Telehealth: Payer: Self-pay | Admitting: Family Medicine

## 2020-03-15 ENCOUNTER — Ambulatory Visit: Payer: Self-pay | Admitting: General Practice

## 2020-03-15 ENCOUNTER — Telehealth: Payer: PPO | Admitting: General Practice

## 2020-03-15 DIAGNOSIS — F321 Major depressive disorder, single episode, moderate: Secondary | ICD-10-CM

## 2020-03-15 DIAGNOSIS — I10 Essential (primary) hypertension: Secondary | ICD-10-CM

## 2020-03-15 DIAGNOSIS — G894 Chronic pain syndrome: Secondary | ICD-10-CM

## 2020-03-15 DIAGNOSIS — M797 Fibromyalgia: Secondary | ICD-10-CM

## 2020-03-15 DIAGNOSIS — E782 Mixed hyperlipidemia: Secondary | ICD-10-CM

## 2020-03-15 NOTE — Patient Instructions (Signed)
Visit Information  Goals Addressed              This Visit's Progress     RN-Inital CCM Care Plan (pt-stated)        Current Barriers:   Chronic Disease Management support, education, and care coordination needs related to HTN, Depression, Fibromyalgia, and Chronic pain  Financial Barriers  Lack of social support   Clinical Goal(s) related to HTN, Depression, Fibromyalgia, and Chronic pain :  Over the next 120 days, patient will:   Work with the care management team to address educational, disease management, and care coordination needs   Call provider office for new or worsened signs and symptoms New or worsened symptom related to Chronic Pain  Call care management team with questions or concerns  Verbalize basic understanding of patient centered plan of care established today  Interventions related to HTN, Depression, Fibromyalgia, and Chronic pain : :   Evaluation of current treatment plans and patient's adherence to plan as established by provider. 03-15-2020: The patient states that she is having a hard time with things and is trying to prioritize her health and needs right now. She states she was having issues with hypotension so she has cut her blood pressure medication in half and now her pressures are 127/76, instead of 70/40.  She is not having sx/sx of hypotension, light headedness or dizziness with cutting medication in half.  Encouraged her to talk to pcp about changes. Review of safety and concerns.  The patient is being safe.  She has been to the pain provider and he has put her back on Ultram and she is thankful for this. This takes the "edge" off of her pain.  She states that she will discuss a colonoscopy with pcp in January. She does not have the finances and feels that it came back positive due to hemorrhoids. Encouraged the patient to write down questions to ask the pcp at next visit on 04-13-2020.    Assessed patient understanding of disease states- The patient  has a good understanding of how to manage her pain effectively. The patient says heat works the best for her and raining colder days she is in bed most of the day. The patient is not having the best of days today and rates her pain level "all over" at a 7-8.  Her goal is for her pain level to be <5. Rates pain at 7 to 8 today. Today is not a good day because of the weather being cooler than usual. When it is sunny she feels better. Endorses taking medications as ordered and using heat application.03-15-2020: The patient states that she still has good days and bad days with her pain, but is better since back on the ultram.  She is frustrated about over all health and management of her care. Expressed financial concerns and only having 70.00+ dollars left over after paying pills. Empathetic listening and support given.   Assessed if the patient had received her blood pressure cuff yet and the patient said no.  Education provided that the patient will receive a blood pressure cuff and it should arrive soon.  The patient is happy that this was approved by [facility] for her. She wants to take her blood pressures and record them daily.03-15-2020: The patient is taking her blood pressure regularly and around 124/76, this is after she has cut her blood pressure medication in half.  Before this she states it was running 70/40 and it was hard for her to  even function. Encouraged collaboration with the pcp.   Evaluation of depression. The patient was a little upset today due to her car being in the shop and the repair was not covered under her extended warranty. The patient was going to have to pay 300.00.  The patients sister allowed her to use her credit card for the bill and she will make monthly payments until she has paid her sister back. The patient was thankful for this. Empathetic listening. 03-15-2020: The patient is receptive to talking to the care guides about help with food resources. Referral placed for meal  delivery options and/or gift card for the patient for food.  She states she makes 18.00 over the limit to get additional help.  Food is a big expense for her. Is receptive to help.  Also states her power bill has increased from 50 dollars a month to 97.    Assessed patient's education and care coordination needs: The patient is smoking still. Has tried to quit before. Was successful with Chantix but cost prohibitive- well discuss options with CCM team Pharmacist.  Also will provide educational material by EMMI and My Chart system. Will use the email: [email or web address].  The patient states she is doing good when she can be active but when she can not be active due to the pain she smokes more. 03-15-2020: The patient is smoking less now with having better pain control. The patient states that she always smokes less when she is more active. Praised for positive changes.   Provided disease specific education to patient- review of Heart Healthy diet.  The patient likes vegetables and does not fry foods. 03-15-2020: Is receptive to talking to care guides for help with food resources.  Collaborated with appropriate clinical care team members regarding patient needs.  Knows about the CCM team and care guide support.  Referral to CCM LCSW for patient reports of this being a difficult time of year for her related to grief, low social support and struggle with depression- LCSW is currently working with the patient  Referral to CCM pharmacist. The patient was getting medication help at one time but then lost it due to having a part time job. The patient can no longer do her part time job and is interested in reapplying for medication help. 03-15-2020: Will ask the pharmacist for assistance due to medication compliance and education.   Patient Self Care Activities related to HTN, Depression, Fibromyalgia, and Chronic pain :   Patient would benefit from support to  self-manage chronic health conditions  Please see  past updates related to this goal by clicking on the "Past Updates" button in the selected goal         The patient verbalized understanding of instructions, educational materials, and care plan provided today and declined offer to receive copy of patient instructions, educational materials, and care plan.   Telephone follow up appointment with care management team member scheduled for: 05-03-2020 at 10:30 am  [place], MSN, [place] Mobile: [phone]

## 2020-03-15 NOTE — Chronic Care Management (AMB) (Signed)
Chronic Care Management   Follow Up Note   03/15/2020 Name: April Price MRN: [id number] DOB: [birth year]  Referred by: Verl Bangs, FNP Reason for referral : Chronic Care Management (RNCM Chronic Disease Management and Care Coordination Needs)   April Price is a 64 y.o. year old female who is a primary care patient of Lorine Bears, Lupita Raider, FNP. The CCM team was consulted for assistance with chronic disease management and care coordination needs.    Review of patient status, including review of consultants reports, relevant laboratory and other test results, and collaboration with appropriate care team members and the patient's provider was performed as part of comprehensive patient evaluation and provision of chronic care management services.    SDOH (Social Determinants of Health) assessments performed: Yes See Care Plan activities for detailed interventions related to [facility])     Outpatient Encounter Medications as of 03/15/2020  Medication Sig  . albuterol (PROVENTIL HFA;VENTOLIN HFA) 108 (90 Base) MCG/ACT inhaler Inhale 2 puffs into the lungs every 4 (four) hours as needed for wheezing or shortness of breath.  Marland Kitchen azelastine (ASTELIN) 0.1 % nasal spray Place 2 sprays into both nostrils 2 (two) times daily as needed for rhinitis. Use in each nostril as directed  . busPIRone (BUSPAR) 10 MG tablet Take 1 tablet (10 mg total) by mouth 3 (three) times daily.  . DULoxetine (CYMBALTA) 60 MG capsule TAKE 1 CAPSULE EVERY DAY  . EPINEPHrine (EPIPEN 2-PAK) 0.3 mg/0.3 mL IJ SOAJ injection Inject 0.3 mLs (0.3 mg total) into the muscle once for 1 dose.  . esomeprazole (NEXIUM) 40 MG capsule TAKE 1 CAPSULE EVERY DAY  . ezetimibe (ZETIA) 10 MG tablet Take 1 tablet (10 mg total) by mouth daily.  . fluticasone (FLONASE) 50 MCG/ACT nasal spray Place 2 sprays into both nostrils daily.  Marland Kitchen lisinopril (ZESTRIL) 10 MG tablet Take 1 tablet (10 mg total) by mouth daily.  . meclizine (ANTIVERT)  25 MG tablet Take 1 tablet (25 mg total) by mouth 3 (three) times daily as needed for dizziness.  . montelukast (SINGULAIR) 10 MG tablet Take 1 tablet (10 mg total) by mouth at bedtime.  . Multiple Vitamins-Minerals (PRESERVISION AREDS 2 PO) Take by mouth.  . pravastatin (PRAVACHOL) 20 MG tablet Take 1 tablet (20 mg total) by mouth daily.  Marland Kitchen tiZANidine (ZANAFLEX) 4 MG tablet TAKE ONE TABLET FOUR TIMES DAILY AS NEEDED FOR MUSCLE SPASM  . Vitamin D, Ergocalciferol, (DRISDOL) 1.25 MG (50000 UNIT) CAPS capsule Take 1 capsule (50,000 Units total) by mouth every 7 (seven) days.   No facility-administered encounter medications on file as of 03/15/2020.     Objective:  BP Readings from Last 3 Encounters:  11/21/19 128/74  08/22/19 (!) 155/62  07/25/19 (!) 158/76    Goals Addressed              This Visit's Progress   .  RN-Inital CCM Care Plan (pt-stated)        Current Barriers:  . Chronic Disease Management support, education, and care coordination needs related to HTN, Depression, Fibromyalgia, and Chronic pain . Financial Barriers . Lack of social support   Clinical Goal(s) related to HTN, Depression, Fibromyalgia, and Chronic pain :  Over the next 120 days, patient will:  . Work with the care management team to address educational, disease management, and care coordination needs  . Call provider office for new or worsened signs and symptoms New or worsened symptom related to Chronic Pain . Call care management  team with questions or concerns . Verbalize basic understanding of patient centered plan of care established today  Interventions related to HTN, Depression, Fibromyalgia, and Chronic pain : :  . Evaluation of current treatment plans and patient's adherence to plan as established by provider. 03-15-2020: The patient states that she is having a hard time with things and is trying to prioritize her health and needs right now. She states she was having issues with hypotension so  she has cut her blood pressure medication in half and now her pressures are 127/76, instead of 70/40.  She is not having sx/sx of hypotension, light headedness or dizziness with cutting medication in half.  Encouraged her to talk to pcp about changes. Review of safety and concerns.  The patient is being safe.  She has been to the pain provider and he has put her back on Ultram and she is thankful for this. This takes the "edge" off of her pain.  She states that she will discuss a colonoscopy with pcp in January. She does not have the finances and feels that it came back positive due to hemorrhoids. Encouraged the patient to write down questions to ask the pcp at next visit on 04-13-2020.   . Assessed patient understanding of disease states- The patient has a good understanding of how to manage her pain effectively. The patient says heat works the best for her and raining colder days she is in bed most of the day. The patient is not having the best of days today and rates her pain level "all over" at a 7-8.  Her goal is for her pain level to be <5. Rates pain at 7 to 8 today. Today is not a good day because of the weather being cooler than usual. When it is sunny she feels better. Endorses taking medications as ordered and using heat application.03-15-2020: The patient states that she still has good days and bad days with her pain, but is better since back on the ultram.  She is frustrated about over all health and management of her care. Expressed financial concerns and only having 70.00+ dollars left over after paying pills. Empathetic listening and support given.  . Assessed if the patient had received her blood pressure cuff yet and the patient said no.  Education provided that the patient will receive a blood pressure cuff and it should arrive soon.  The patient is happy that this was approved by [facility] for her. She wants to take her blood pressures and record them daily.03-15-2020: The patient is taking her blood  pressure regularly and around 124/76, this is after she has cut her blood pressure medication in half.  Before this she states it was running 70/40 and it was hard for her to even function. Encouraged collaboration with the pcp.  . Evaluation of depression. The patient was a little upset today due to her car being in the shop and the repair was not covered under her extended warranty. The patient was going to have to pay 300.00.  The patients sister allowed her to use her credit card for the bill and she will make monthly payments until she has paid her sister back. The patient was thankful for this. Empathetic listening. 03-15-2020: The patient is receptive to talking to the care guides about help with food resources. Referral placed for meal delivery options and/or gift card for the patient for food.  She states she makes 18.00 over the limit to get additional help.  Food is  a big expense for her. Is receptive to help.  Also states her power bill has increased from 50 dollars a month to 97.   . Assessed patient's education and care coordination needs: The patient is smoking still. Has tried to quit before. Was successful with Chantix but cost prohibitive- well discuss options with CCM team Pharmacist.  Also will provide educational material by EMMI and My Chart system. Will use the email: [email or web address].  The patient states she is doing good when she can be active but when she can not be active due to the pain she smokes more. 03-15-2020: The patient is smoking less now with having better pain control. The patient states that she always smokes less when she is more active. Praised for positive changes.  . Provided disease specific education to patient- review of Heart Healthy diet.  The patient likes vegetables and does not fry foods. 03-15-2020: Is receptive to talking to care guides for help with food resources. [name] with appropriate clinical care team members regarding patient needs.  Knows about  the CCM team and care guide support. . Referral to CCM LCSW for patient reports of this being a difficult time of year for her related to grief, low social support and struggle with depression- LCSW is currently working with the patient . Referral to CCM pharmacist. The patient was getting medication help at one time but then lost it due to having a part time job. The patient can no longer do her part time job and is interested in reapplying for medication help. 03-15-2020: Will ask the pharmacist for assistance due to medication compliance and education.   Patient Self Care Activities related to HTN, Depression, Fibromyalgia, and Chronic pain :  . Patient would benefit from support to  self-manage chronic health conditions  Please see past updates related to this goal by clicking on the "Past Updates" button in the selected goal         There are no care plans to display for this patient.   Plan:   Telephone follow up appointment with care management team member scheduled for: 05-03-2020 at 82 am   Noreene Larsson RN, MSN, [place] Mobile: [phone]

## 2020-03-16 ENCOUNTER — Telehealth: Payer: Self-pay | Admitting: Family Medicine

## 2020-03-16 NOTE — Telephone Encounter (Signed)
Claretta Fraise 03/16/2020 Called pt regarding community resource referral received. My info is [phone] please see ref notes for more details. Thank you.  [place], Care Management

## 2020-03-17 ENCOUNTER — Ambulatory Visit
Admission: RE | Admit: 2020-03-17 | Discharge: 2020-03-17 | Disposition: A | Discharge status: Home / Self Care | Payer: PPO | Source: Ambulatory Visit | Attending: Family Medicine | Admitting: Family Medicine

## 2020-03-17 ENCOUNTER — Other Ambulatory Visit: Payer: Self-pay

## 2020-03-17 DIAGNOSIS — R634 Abnormal weight loss: Secondary | ICD-10-CM | POA: Diagnosis not present

## 2020-03-17 DIAGNOSIS — R29890 Loss of height: Secondary | ICD-10-CM | POA: Diagnosis not present

## 2020-03-17 DIAGNOSIS — Z8739 Personal history of other diseases of the musculoskeletal system and connective tissue: Secondary | ICD-10-CM | POA: Diagnosis not present

## 2020-03-17 DIAGNOSIS — Z780 Asymptomatic menopausal state: Secondary | ICD-10-CM | POA: Insufficient documentation

## 2020-03-17 DIAGNOSIS — M8580 Other specified disorders of bone density and structure, unspecified site: Secondary | ICD-10-CM | POA: Diagnosis not present

## 2020-03-17 DIAGNOSIS — Z13820 Encounter for screening for osteoporosis: Secondary | ICD-10-CM | POA: Diagnosis not present

## 2020-03-17 DIAGNOSIS — Z1231 Encounter for screening mammogram for malignant neoplasm of breast: Secondary | ICD-10-CM | POA: Diagnosis not present

## 2020-03-17 DIAGNOSIS — Z90710 Acquired absence of both cervix and uterus: Secondary | ICD-10-CM | POA: Diagnosis not present

## 2020-03-22 ENCOUNTER — Telehealth: Payer: Self-pay

## 2020-03-22 ENCOUNTER — Other Ambulatory Visit: Payer: Self-pay | Admitting: Family Medicine

## 2020-03-22 ENCOUNTER — Telehealth: Payer: Self-pay | Admitting: Family Medicine

## 2020-03-22 ENCOUNTER — Telehealth: Payer: Self-pay | Admitting: Pharmacist

## 2020-03-22 DIAGNOSIS — M810 Age-related osteoporosis without current pathological fracture: Secondary | ICD-10-CM

## 2020-03-22 MED ORDER — ALENDRONATE SODIUM 70 MG PO TABS: 70.0000 mg | ORAL_TABLET | ORAL | 11 refills | Status: DC

## 2020-03-22 NOTE — Telephone Encounter (Signed)
Fosamax sent to pharmacy on file.  To take 1 tablet per week.  Take first thing in the morning, with water, nothing to eat or drink for 30 minutes after taking and stay sitting up/walking around for that 30 minutes.  Can repeat DEXA in 2-3 years.

## 2020-03-22 NOTE — Telephone Encounter (Signed)
  Chronic Care Management   Outreach Note  03/22/2020 Name: Falesha Schommer MRN: [id number] DOB: 09-03-1955  Referred by: Verl Bangs, FNP Reason for referral : No chief complaint on file.  Was unable to reach patient via telephone today and have left HIPAA compliant voicemail asking patient to return my call.   Follow Up Plan: Will collaborate with Care Guide to outreach to schedule follow up with me  Harlow Asa, PharmD, [place] Management [phone]

## 2020-03-22 NOTE — Telephone Encounter (Signed)
The pt was notified of the providers recommendation. She verbalize understanding, no questions or concerns.

## 2020-03-22 NOTE — Telephone Encounter (Signed)
-----   Message from Tarry Kos, RN sent at 03/22/2020  8:56 AM EST ----- Reviewed bone density results with the patient.  She is open to starting Fosamax again. Routing to clinic for order.

## 2020-03-25 ENCOUNTER — Telehealth: Payer: Self-pay

## 2020-03-25 NOTE — Chronic Care Management (AMB) (Signed)
  Care Management   Note  03/25/2020 Name: April Price MRN: [id number] DOB: January 03, 1956  April Price is a 64 y.o. year old female who is a primary care patient of Lorine Bears, Lupita Raider, [place] and is actively engaged with the care management team. I reached out to [name] by phone today to assist with re-scheduling a follow up visit with the Pharmacist  Follow up plan: Unsuccessful telephone outreach attempt made. A HIPAA compliant phone message was left for the patient providing contact information and requesting a return call.  The care management team will reach out to the patient again over the next 7 days.  If patient returns call to provider office, please advise to call [facility]  at [place], [place], [place], [place] [address] Direct Dial: [phone] [email or web address]: [email or web address]

## 2020-04-07 NOTE — Chronic Care Management (AMB) (Signed)
  Care Management   Note  04/07/2020 Name: [name] MRN: [id number] DOB: 10-21-1955  April Price is a 64 y.o. year old female who is a primary care patient of Anitra Lauth, Jodelle Gross, FNP and is actively engaged with the care management team. I reached out to Elsie Ra by phone today to assist with re-scheduling a follow up visit with the Pharmacist  Follow up plan: Unsuccessful telephone outreach attempt made. A HIPAA compliant phone message was left for the patient providing contact information and requesting a return call.  The care management team will reach out to the patient again over the next 7 days.  If patient returns call to provider office, please advise to call Embedded Care Management Care Guide Penne Lash  at [phone]  Penne Lash, RMA Care Guide, Embedded Care Coordination [facility]  [place], [place] [address] Direct Dial: [phone] [email or web address]: [email or web address]

## 2020-04-13 ENCOUNTER — Ambulatory Visit: Payer: PPO | Admitting: Family Medicine

## 2020-04-15 ENCOUNTER — Telehealth: Payer: Self-pay | Admitting: Licensed Clinical Social Worker

## 2020-04-15 ENCOUNTER — Ambulatory Visit: Payer: PPO | Admitting: Licensed Clinical Social Worker

## 2020-04-15 DIAGNOSIS — M810 Age-related osteoporosis without current pathological fracture: Secondary | ICD-10-CM

## 2020-04-15 DIAGNOSIS — M797 Fibromyalgia: Secondary | ICD-10-CM

## 2020-04-15 DIAGNOSIS — I10 Essential (primary) hypertension: Secondary | ICD-10-CM

## 2020-04-15 DIAGNOSIS — F321 Major depressive disorder, single episode, moderate: Secondary | ICD-10-CM

## 2020-04-15 DIAGNOSIS — G894 Chronic pain syndrome: Secondary | ICD-10-CM

## 2020-04-15 DIAGNOSIS — E782 Mixed hyperlipidemia: Secondary | ICD-10-CM

## 2020-04-15 NOTE — Telephone Encounter (Signed)
  Chronic Care Management    Clinical Social Work General Follow Up Note  04/15/2020 Name: April Price MRN: [id number] DOB: [birth year]  April Price is a 65 y.o. year old female who is a primary care patient of Anitra Lauth, Jodelle Gross, FNP. The CCM team was consulted for assistance with Mental Health Counseling and Resources.   Review of patient status, including review of consultants reports, relevant laboratory and other test results, and collaboration with appropriate care team members and the patient's provider was performed as part of comprehensive patient evaluation and provision of chronic care management services.    LCSW completed CCM outreach attempt today but was unable to reach patient successfully. A HIPPA compliant voice message was left encouraging patient to return call once available. LCSW will ask Scheduling Care Guide to reschedule CCM SW appointment with patient as well.  Outpatient Encounter Medications as of 04/15/2020  Medication Sig  . albuterol (PROVENTIL HFA;VENTOLIN HFA) 108 (90 Base) MCG/ACT inhaler Inhale 2 puffs into the lungs every 4 (four) hours as needed for wheezing or shortness of breath.  Marland Kitchen alendronate (FOSAMAX) 70 MG tablet Take 1 tablet (70 mg total) by mouth every 7 (seven) days. Take with a full glass of water on an empty stomach.  Marland Kitchen azelastine (ASTELIN) 0.1 % nasal spray Place 2 sprays into both nostrils 2 (two) times daily as needed for rhinitis. Use in each nostril as directed  . busPIRone (BUSPAR) 10 MG tablet Take 1 tablet (10 mg total) by mouth 3 (three) times daily.  . DULoxetine (CYMBALTA) 60 MG capsule TAKE 1 CAPSULE EVERY DAY  . EPINEPHrine (EPIPEN 2-PAK) 0.3 mg/0.3 mL IJ SOAJ injection Inject 0.3 mLs (0.3 mg total) into the muscle once for 1 dose.  . esomeprazole (NEXIUM) 40 MG capsule TAKE 1 CAPSULE EVERY DAY  . ezetimibe (ZETIA) 10 MG tablet Take 1 tablet (10 mg total) by mouth daily.  . fluticasone (FLONASE) 50 MCG/ACT nasal spray  Place 2 sprays into both nostrils daily.  Marland Kitchen lisinopril (ZESTRIL) 10 MG tablet Take 1 tablet (10 mg total) by mouth daily.  . meclizine (ANTIVERT) 25 MG tablet Take 1 tablet (25 mg total) by mouth 3 (three) times daily as needed for dizziness.  . montelukast (SINGULAIR) 10 MG tablet Take 1 tablet (10 mg total) by mouth at bedtime.  . Multiple Vitamins-Minerals (PRESERVISION AREDS 2 PO) Take by mouth.  . pravastatin (PRAVACHOL) 20 MG tablet Take 1 tablet (20 mg total) by mouth daily.  Marland Kitchen tiZANidine (ZANAFLEX) 4 MG tablet TAKE ONE TABLET FOUR TIMES DAILY AS NEEDED FOR MUSCLE SPASM  . Vitamin D, Ergocalciferol, (DRISDOL) 1.25 MG (50000 UNIT) CAPS capsule Take 1 capsule (50,000 Units total) by mouth every 7 (seven) days.   No facility-administered encounter medications on file as of 04/15/2020.   Follow Up Plan: Scheduling Care Guide will reach out to patient to reschedule appointment.   Dickie La, BSW, MSW, LCSW [facility] [place]  [facility] [email or web address] Phone: [phone]

## 2020-04-15 NOTE — Chronic Care Management (AMB) (Signed)
Chronic Care Management    Clinical Social Work Follow Up Note  04/15/2020 Name: April Price MRN: [id number] DOB: [birth year]  April Price is a 65 y.o. year old female who is a primary care patient of Anitra Lauth, Jodelle Gross, FNP. The CCM team was consulted for assistance with Mental Health Counseling and Resources.   Review of patient status, including review of consultants reports, other relevant assessments, and collaboration with appropriate care team members and the patient's provider was performed as part of comprehensive patient evaluation and provision of chronic care management services.    SDOH (Social Determinants of Health) assessments performed: Yes    Outpatient Encounter Medications as of 04/15/2020  Medication Sig  . albuterol (PROVENTIL HFA;VENTOLIN HFA) 108 (90 Base) MCG/ACT inhaler Inhale 2 puffs into the lungs every 4 (four) hours as needed for wheezing or shortness of breath.  Marland Kitchen alendronate (FOSAMAX) 70 MG tablet Take 1 tablet (70 mg total) by mouth every 7 (seven) days. Take with a full glass of water on an empty stomach.  Marland Kitchen azelastine (ASTELIN) 0.1 % nasal spray Place 2 sprays into both nostrils 2 (two) times daily as needed for rhinitis. Use in each nostril as directed  . busPIRone (BUSPAR) 10 MG tablet Take 1 tablet (10 mg total) by mouth 3 (three) times daily.  . DULoxetine (CYMBALTA) 60 MG capsule TAKE 1 CAPSULE EVERY DAY  . EPINEPHrine (EPIPEN 2-PAK) 0.3 mg/0.3 mL IJ SOAJ injection Inject 0.3 mLs (0.3 mg total) into the muscle once for 1 dose.  . esomeprazole (NEXIUM) 40 MG capsule TAKE 1 CAPSULE EVERY DAY  . ezetimibe (ZETIA) 10 MG tablet Take 1 tablet (10 mg total) by mouth daily.  . fluticasone (FLONASE) 50 MCG/ACT nasal spray Place 2 sprays into both nostrils daily.  Marland Kitchen lisinopril (ZESTRIL) 10 MG tablet Take 1 tablet (10 mg total) by mouth daily.  . meclizine (ANTIVERT) 25 MG tablet Take 1 tablet (25 mg total) by mouth 3 (three) times daily as needed  for dizziness.  . montelukast (SINGULAIR) 10 MG tablet Take 1 tablet (10 mg total) by mouth at bedtime.  . Multiple Vitamins-Minerals (PRESERVISION AREDS 2 PO) Take by mouth.  . pravastatin (PRAVACHOL) 20 MG tablet Take 1 tablet (20 mg total) by mouth daily.  Marland Kitchen tiZANidine (ZANAFLEX) 4 MG tablet TAKE ONE TABLET FOUR TIMES DAILY AS NEEDED FOR MUSCLE SPASM  . Vitamin D, Ergocalciferol, (DRISDOL) 1.25 MG (50000 UNIT) CAPS capsule Take 1 capsule (50,000 Units total) by mouth every 7 (seven) days.   No facility-administered encounter medications on file as of 04/15/2020.     Goals Addressed    . SW-Track and Manage My Symptoms-Depression       Timeframe:  Long-Range Goal Priority:  Medium Start Date: 04/15/20                       Expected End Date: 06/13/20                      Follow Up Date - 90 days from 04/15/20   - avoid negative self-talk - develop a personal safety plan - develop a plan to deal with triggers like holidays, anniversaries - exercise at least 2 to 3 times per week - have a plan for how to handle bad days - journal feelings and what helps to feel better or worse - spend time or talk with others at least 2 to 3 times per week - spend time  or talk with others every day - watch for early signs of feeling worse - write in journal every day    Why is this important?    Keeping track of your progress will help your treatment team find the right mix of medicine and therapy for you.   Write in your journal every day.   Day-to-day changes in depression symptoms are normal. It may be more helpful to check your progress at the end of each week instead of every day.     Current Barriers:  . Chronic Mental Health needs related to pain and major depressive disorder . Financial constraints related to managing health care expenses . Limited social support . ADL IADL limitations . Mental Health Concerns  . Limited access to caregiver . Suicidal Ideation/Homicidal Ideation:  No  Clinical Social Work Goal(s):  Marland Kitchen Over the next 120 days, patient will work with SW bi-monthly by telephone or in person to reduce or manage symptoms related to chronic pain and depression . Over the next 120 days, patient will work with SW to address concerns related to ongoing stress, isolation, pain and depressive symptoms  Interventions: . Patient interviewed and appropriate assessments performed: brief mental health assessment . Patient reports ongoing chronic pain and has not been able to find any relief. Patient is interested in a possible rheumatologist referral. Patient was encouraged to discuss this with PCP during next office visit. Patient is currently seeing a pain management physician. Patient reports that she met new PCP and received trigger point injections that have been helpful with managing her pain. Patient reports that her pain is currently manageable even with the cold weather.  . Patient completed both COVID vaccinations. Patient reports that her family member has COVID right now and she is taking appropriate precautions to protect herself. Patient shares that her brother in law has COVID this time (02/26/20) . LCSW discussed coping skills for anxiety. SW used empathetic and active and reflective listening, validated patient's feelings/concerns, and provided emotional support. LCSW provided self-care education to help manage her multiple health conditions and improve her mood.  . Provided patient with information about available mental health support resources within the area . Discussed plans with patient for ongoing care management follow up and provided patient with direct contact information for care management team . Advised patient to contact CCM Social Worker for urgent mental health needs/case management needs . Assisted patient/caregiver with obtaining information about health plan benefits . A voluntary and extensive discussion about advanced care planning including  explanation and discussion of advanced was undertaken with the patient.  Explanation regarding healthcare proxy and living will was reviewed. . Patient admits that she could be more active. Healthy self-care education provided. Patient does not wish to return to the [facility] until COVID has calmed down. . Provided education to patient/caregiver about Hospice and/or Palliative Care services . Brief CBT provided throughout entire session. Patient does not want psychiatric referral at this time.  . Active listening utilized, counseling provided, current coping strategies identified, decision-making supported, healthy lifestyle promoted, journaling promoted, meditative movement therapy encouraged, mindfulness encouraged, participation in counseling encouraged, problem-solving facilitated, relaxation techniques promoted, self-reflection promoted, spiritual activities promoted, verbalization of feelings encouraged, affirmation provided, community involvement promoted, expression of thoughts about present/future encouraged, independence in all possible areas promote, life review by storytelling encouraged, patient strengths promoted, psychosocial concerns monitored, self-expression encouraged, sleep diary encouraged, sleep hygiene techniques encouraged, social relationships promoted, strategies to maintain hearing and/or vision promoted and wellness behaviors promoted .  Patient reports that her blood pressure has been good lately and that she keeps track of her blood pressure log daily.  . Patient reports that she does not need a steroid injection as her pain is very manageable at this time.  . Patient reports that the weather affects her pain and she has been able to be more active with the nice weather and limited chronic pain.  . Patient reports concern regarding billing for CCM Services. LCSW provided education centered around [facility] management and our services. Patient prefers 90 day follow up calls from entire team  due to limited finances. LCSW updated entire CCM team and PCP.  Marland Kitchen Patient shares that recently went to a new pain doctor and will see them again next week for injections. She shares that her pain doctor found burn marks on her back from using her heating pad at night.  . Patient was upset that she was taken off of ibuprofen due to kidney side effects but shares that her pain doctor prescribed her tramadol for pain relief.  . Patient was positive for color guard is aware that she needs to address this concern but does not wish to discuss or address this until after COVID settles down.   Patient Self Care Activities:  . Ability for insight . Motivation for treatment . Strong family or social support  Patient Coping Strengths:  . Hopefulness . Self Advocate . Able to Communicate Effectively  Patient Self Care Deficits:  . Lacks social connections  Please see past updates related to this goal by clicking on the "Past Updates" button in the selected goal      Follow Up Plan: SW will follow up with patient by phone over the next quarter  Eula Fried, [place], MSW, [email or web address] Phone: [phone]

## 2020-04-19 ENCOUNTER — Other Ambulatory Visit: Payer: Self-pay

## 2020-04-19 ENCOUNTER — Encounter: Payer: Self-pay | Admitting: Family Medicine

## 2020-04-19 ENCOUNTER — Ambulatory Visit (INDEPENDENT_AMBULATORY_CARE_PROVIDER_SITE_OTHER): Payer: PPO | Admitting: Family Medicine

## 2020-04-19 ENCOUNTER — Ambulatory Visit: Payer: PPO | Admitting: Family Medicine

## 2020-04-19 VITALS — BP 141/68 | HR 106 | Temp 97.5°F | Resp 18 | Ht 64.0 in | Wt 170.4 lb

## 2020-04-19 DIAGNOSIS — F3289 Other specified depressive episodes: Secondary | ICD-10-CM

## 2020-04-19 DIAGNOSIS — I10 Essential (primary) hypertension: Secondary | ICD-10-CM

## 2020-04-19 DIAGNOSIS — R195 Other fecal abnormalities: Secondary | ICD-10-CM | POA: Insufficient documentation

## 2020-04-19 MED ORDER — ESCITALOPRAM OXALATE 5 MG PO TABS: 5.0000 mg | ORAL_TABLET | Freq: Every day | ORAL | 1 refills | Status: DC

## 2020-04-19 NOTE — Assessment & Plan Note (Signed)
Controlled hypertension.  BP is just above goal < 130/80.  Pt not working on lifestyle modifications.  Taking medications and reports decrease in SBP to < 70 and feeling unwell. Reports has not taken her lisinopril in about 1 week without changes in BP.  Will discontinue lisinopril and continue to monitor BP.  Plan: 1. STOP lisinopril 10mg  daily and re-evaluate BP through spot BP readings 2. Obtain labs at next visit 3. Encouraged heart healthy diet and increasing exercise to 30 minutes most days of the week, going no more than 2 days in a row without exercise. 4. Check BP 1-2 x per week at home, keep log, and bring to clinic at next appointment. 5. Follow up 3 months.

## 2020-04-19 NOTE — Patient Instructions (Signed)
I have sent in a prescription for escitalopram 5mg  to take 1 tablet daily.  As we discussed, take 1 capsule every other day of your duloxetine through Friday to wean down off of this.  The following recommendations are helpful adjuncts for helping rebalance your mood.  Eat a nourishing diet. Ensure adequate intake of calories, protein, carbs, fat, vitamins, and minerals. Prioritize whole foods at each meal, including meats, vegetables, fruits, nuts and seeds, etc.   Avoid inflammatory and/or "junk" foods, such as sugar, omega-6 fats, refined grains, chemicals, and preservatives are common in packaged and prepared foods. Minimize or completely avoid these ingredients and stick to whole foods with little to no additives. Cook from scratch as much as possible for more control over what you eat  Get enough sleep. Poor sleep is significantly associated with depression and anxiety. Make 7-9 hours of sleep nightly a top priority  Exercise appropriately. Exercise is known to improve brain functioning and boost mood. Aim for 30 minutes of daily physical activity. Avoid "overtraining," which can cause mental disturbances  Assess your light exposure. Not enough natural light during the day and too much artificial light can have a major impact on your mood. Get outside as often as possible during daylight hours. Minimize light exposure after dark and avoid the use of electronics that give off blue light before bed  Manage your stress.  Use daily stress management techniques such as meditation, yoga, or mindfulness to retrain your brain to respond differently to stress. Try deep breathing to deactivate your "fight or flight" response.  There are many of sources with apps like Headspace, Calm or a variety of YouTube videos (videos from Gwynne Edinger have guided meditation)  Prioritize your social life. Work on building social support with new friends or improve current relationships. Consider getting a pet that  allows for companionship, social interaction, and physical touch. Try volunteering or joining a faith-based community to increase your sense of purpose  4-7-8 breathing technique at bedtime: breathe in to count of 4, hold breath for count of 7, exhale for count of 8; do 3-5 times for letting go of overactive thoughts  Take time to play Unstructured "play" time can help reduce anxiety and depression Options for play include music, games, sports, dance, art, etc.  Try to add daily omega 3 fatty acids, magnesium, B complex, and balanced amino acid supplements to help improve mood and anxiety.  STOP the lisinopril based on your low blood pressure readings  We will plan to see you back in 4 weeks for virtual office visit to discuss depression medications  You will receive a survey after today's visit either digitally by e-mail or paper by [place] mail. Your experiences and feedback matter to [place].  Please respond so we know how we are doing as we provide care for you.  Call us with any questions/concerns/needs.  It is my goal to be available to you for your health concerns.  Thanks for choosing me to be a partner in your healthcare needs!  Harlin Rain, FNP-C Family Nurse Practitioner [facility] Phone: [phone]

## 2020-04-19 NOTE — Assessment & Plan Note (Signed)
Positive Cologuard, declines referral to GI.

## 2020-04-19 NOTE — Assessment & Plan Note (Signed)
Has been on duloxetine 60mg  daily, is interested in starting on a different medication, reports does not see change in mood with duloxetine.  Will titrate off and titrate onto escitalopram 5mg  daily.  Review mood handout.  RTC in 4 weeks.

## 2020-04-19 NOTE — Progress Notes (Signed)
Subjective:    Patient ID: [name], female    DOB: September 16, 1955, 65 y.o.   MRN: [id number]  Stefana Lodico is a 65 y.o. female presenting on 04/19/2020 for Hypertension and Depression   HPI  Ms. Kozakiewicz presents to clinic for a follow up on her hypertension and depression.  Reports that she has stopped taking her lisinopril a few days ago due to having low BP readings that had systolic readings in the 01'S and that she was symptomatic.  Reports she has been taking duloxetine 60mg  daily without change in her symptoms, is interested in starting on a new medication.  Reports having taken some medication > 15 years ago that caused leg swelling for depression but is unable to recall the name of this prescription.  Depression screen [facility] 2/9 11/21/2019 07/25/2019 06/26/2019  Decreased Interest 0 0 0  Down, Depressed, Hopeless 0 0 0  PHQ - 2 Score 0 0 0  Altered sleeping 2 0 -  Tired, decreased energy 1 3 -  Change in appetite 0 0 -  Feeling bad or failure about yourself  0 0 -  Trouble concentrating 0 1 -  Moving slowly or fidgety/restless 0 0 -  Suicidal thoughts 0 0 -  PHQ-9 Score 3 4 -  Difficult doing work/chores Somewhat difficult Not difficult at all -  Some recent data might be hidden    Social History   Tobacco Use  . Smoking status: Current Every Day Smoker    Packs/day: 0.50    Years: 40.00    Pack years: 20.00    Types: Cigarettes  . Smokeless tobacco: Never Used  Vaping Use  . Vaping Use: Never used  Substance Use Topics  . Alcohol use: Never  . Drug use: Never    Review of Systems  Constitutional: Negative.   HENT: Negative.   Eyes: Negative.   Respiratory: Negative.   Cardiovascular: Negative.   Gastrointestinal: Negative.   Endocrine: Negative.   Genitourinary: Negative.   Musculoskeletal: Negative.   Skin: Negative.   Allergic/Immunologic: Negative.   Neurological: Negative.   Hematological: Negative.   Psychiatric/Behavioral:  Positive for dysphoric mood. Negative for agitation, behavioral problems, confusion, decreased concentration, hallucinations, self-injury, sleep disturbance and suicidal ideas. The patient is not nervous/anxious and is not hyperactive.    Per HPI unless specifically indicated above     Objective:    BP (!) 141/68 (BP Location: Right Arm, Patient Position: Sitting, Cuff Size: Normal)   Pulse (!) 106   Temp (!) 97.5 F (36.4 C) (Oral)   Resp 18   Ht 5\' 4"  (1.626 m)   Wt 170 lb 6.4 oz (77.3 kg)   SpO2 99%   BMI 29.25 kg/m   Wt Readings from Last 3 Encounters:  04/19/20 170 lb 6.4 oz (77.3 kg)  11/21/19 177 lb 12.8 oz (80.6 kg)  08/22/19 178 lb 3.2 oz (80.8 kg)    Physical Exam Vitals and nursing note reviewed.  Constitutional:      General: She is not in acute distress.    Appearance: Normal appearance. She is well-developed, well-groomed and overweight. She is not ill-appearing or toxic-appearing.  HENT:     Head: Normocephalic and atraumatic.     Nose:     Comments: [name] is in place, covering mouth and nose. Eyes:     General: Lids are normal. Vision grossly intact.        Right eye: No discharge.  Left eye: No discharge.     Extraocular Movements: Extraocular movements intact.     Conjunctiva/sclera: Conjunctivae normal.     Pupils: Pupils are equal, round, and reactive to light.  Cardiovascular:     Rate and Rhythm: Normal rate and regular rhythm.     Pulses: Normal pulses.     Heart sounds: Normal heart sounds. No murmur heard. No friction rub. No gallop.   Pulmonary:     Effort: Pulmonary effort is normal. No respiratory distress.     Breath sounds: Normal breath sounds.  Skin:    General: Skin is warm and dry.     Capillary Refill: Capillary refill takes less than 2 seconds.  Neurological:     General: No focal deficit present.     Mental Status: She is alert and oriented to person, place, and time.  Psychiatric:        Attention and Perception:  Attention and perception normal.        Mood and Affect: Mood and affect normal.        Speech: Speech normal.        Behavior: Behavior normal. Behavior is cooperative.        Thought Content: Thought content normal.        Cognition and Memory: Cognition and memory normal.        Judgment: Judgment normal.    Results for orders placed or performed in visit on 02/19/20  Cologuard  Result Value Ref Range   Cologuard Positive (A) Negative      Assessment & Plan:   Problem List Items Addressed This Visit      Cardiovascular and Mediastinum   Hypertension    Controlled hypertension.  BP is just above goal < 130/80.  Pt not working on lifestyle modifications.  Taking medications and reports decrease in SBP to < 70 and feeling unwell. Reports has not taken her lisinopril in about 1 week without changes in BP.  Will discontinue lisinopril and continue to monitor BP.  Plan: 1. STOP lisinopril 10mg  daily and re-evaluate BP through spot BP readings 2. Obtain labs at next visit 3. Encouraged heart healthy diet and increasing exercise to 30 minutes most days of the week, going no more than 2 days in a row without exercise. 4. Check BP 1-2 x per week at home, keep log, and bring to clinic at next appointment. 5. Follow up 3 months.         Other   Depression - Primary    Has been on duloxetine 60mg  daily, is interested in starting on a different medication, reports does not see change in mood with duloxetine.  Will titrate off and titrate onto escitalopram 5mg  daily.  Review mood handout.  RTC in 4 weeks.      Relevant Medications   escitalopram (LEXAPRO) 5 MG tablet   Positive colorectal cancer screening using Cologuard test    Positive Cologuard, declines referral to GI.         Meds ordered this encounter  Medications  . escitalopram (LEXAPRO) 5 MG tablet    Sig: Take 1 tablet (5 mg total) by mouth daily.    Dispense:  30 tablet    Refill:  1   Follow up plan: Return in  about 4 weeks (around 05/17/2020) for Depression f/u.   Harlin Rain, [place] Family Nurse Practitioner [facility] 04/19/2020, 1:49 PM

## 2020-04-28 NOTE — Telephone Encounter (Signed)
3rd unsuccessful outreach  

## 2020-04-28 NOTE — Chronic Care Management (AMB) (Signed)
  Care Management   Note  04/28/2020 Name: April Price MRN: [id number] DOB: Jun 19, 1955  [name] is a 65 y.o. year old female who is a primary care patient of Lorine Bears, Lupita Raider, [place] and is actively engaged with the care management team. I reached out to [name] by phone today to assist with re-scheduling a follow up visit with the Pharmacist  Follow up plan: Unsuccessful telephone outreach attempt made. A HIPAA compliant phone message was left for the patient providing contact information and requesting a return call.  Unable to make contact on outreach attempts x . PCP Cyndia Skeeters, FNP  notified via routed documentation in medical record.   Noreene Larsson, [place], [place], [place] [address] Direct Dial: [phone] [email or web address]: [email or web address]

## 2020-05-03 ENCOUNTER — Telehealth: Payer: Self-pay | Admitting: General Practice

## 2020-05-03 ENCOUNTER — Ambulatory Visit: Payer: Self-pay | Admitting: General Practice

## 2020-05-03 DIAGNOSIS — M797 Fibromyalgia: Secondary | ICD-10-CM

## 2020-05-03 DIAGNOSIS — G894 Chronic pain syndrome: Secondary | ICD-10-CM

## 2020-05-03 DIAGNOSIS — E785 Hyperlipidemia, unspecified: Secondary | ICD-10-CM

## 2020-05-03 DIAGNOSIS — F419 Anxiety disorder, unspecified: Secondary | ICD-10-CM

## 2020-05-03 DIAGNOSIS — I10 Essential (primary) hypertension: Secondary | ICD-10-CM

## 2020-05-03 DIAGNOSIS — F3289 Other specified depressive episodes: Secondary | ICD-10-CM

## 2020-05-03 NOTE — Chronic Care Management (AMB) (Signed)
Chronic Care Management   CCM RN Visit Note  05/03/2020 Name: April Price MRN: [id number] DOB: 09/27/1955  Subjective: April Price is a 65 y.o. year old female who is a primary care patient of Malfi, Nicole M, FNP. The care management team was consulted for assistance with disease management and care coordination needs.    Engaged with patient by telephone for follow up visit in response to provider referral for case management and/or care coordination services.   Consent to Services:  Currently engaged with the CCM program   Patient agreed to services and verbal consent obtained.   Assessment: Review of patient past medical history, allergies, medications, health status, including review of consultants reports, laboratory and other test data, was performed as part of comprehensive evaluation and provision of chronic care management services.   SDOH (Social Determinants of Health) assessments and interventions performed:    CCM Care Plan  Allergies  Allergen Reactions  . Penicillins Anaphylaxis    Has patient had a PCN reaction causing immediate rash, facial/tongue/throat swelling, SOB or lightheadedness with hypotension: Yes Has patient had a PCN reaction causing severe rash involving mucus membranes or skin necrosis: No Has patient had a PCN reaction that required hospitalization No Has patient had a PCN reaction occurring within the last 10 years: Yes If all of the above answers are "NO", then may proceed with Cephalosporin use.   . Tylenol [Acetaminophen] Anaphylaxis, Itching and Hives    Ears and throat itch   . Morphine And Related Other (See Comments)    Depression   . Nickel Itching  . Pepcid [Famotidine] Nausea And Vomiting  . Shrimp [Shellfish Allergy] Hives  . Tape     irritation  . Tetanus Toxoids Other (See Comments)    Swelling at injection site and fever  . Wheat Bran   . Corn-Containing Products     Has hay fever and swelling in  fingers.  . Gabapentin Other (See Comments)    Makes her feel "spacy" off balance, like she is coming out of anesthesia.     Outpatient Encounter Medications as of 05/03/2020  Medication Sig  . albuterol (PROVENTIL HFA;VENTOLIN HFA) 108 (90 Base) MCG/ACT inhaler Inhale 2 puffs into the lungs every 4 (four) hours as needed for wheezing or shortness of breath.  . alendronate (FOSAMAX) 70 MG tablet Take 1 tablet (70 mg total) by mouth every 7 (seven) days. Take with a full glass of water on an empty stomach.  . azelastine (ASTELIN) 0.1 % nasal spray Place 2 sprays into both nostrils 2 (two) times daily as needed for rhinitis. Use in each nostril as directed  . busPIRone (BUSPAR) 10 MG tablet Take 1 tablet (10 mg total) by mouth 3 (three) times daily.  . EPINEPHrine (EPIPEN 2-PAK) 0.3 mg/0.3 mL IJ SOAJ injection Inject 0.3 mLs (0.3 mg total) into the muscle once for 1 dose.  . escitalopram (LEXAPRO) 5 MG tablet Take 1 tablet (5 mg total) by mouth daily.  . esomeprazole (NEXIUM) 40 MG capsule TAKE 1 CAPSULE EVERY DAY  . ezetimibe (ZETIA) 10 MG tablet Take 1 tablet (10 mg total) by mouth daily.  . fluticasone (FLONASE) 50 MCG/ACT nasal spray Place 2 sprays into both nostrils daily.  . meclizine (ANTIVERT) 25 MG tablet Take 1 tablet (25 mg total) by mouth 3 (three) times daily as needed for dizziness.  . montelukast (SINGULAIR) 10 MG tablet Take 1 tablet (10 mg total) by mouth at bedtime.  . Multiple Vitamins-Minerals (PRESERVISION   AREDS 2 PO) Take by mouth.  . pravastatin (PRAVACHOL) 20 MG tablet Take 1 tablet (20 mg total) by mouth daily.  Marland Kitchen tiZANidine (ZANAFLEX) 4 MG tablet TAKE ONE TABLET FOUR TIMES DAILY AS NEEDED FOR MUSCLE SPASM  . traMADol (ULTRAM) 50 MG tablet Take 50 mg by mouth every 8 (eight) hours as needed.  . Vitamin D, Ergocalciferol, (DRISDOL) 1.25 MG (50000 UNIT) CAPS capsule Take 1 capsule (50,000 Units total) by mouth every 7 (seven) days.   No facility-administered encounter  medications on file as of 05/03/2020.    Patient Active Problem List   Diagnosis Date Noted  . Positive colorectal cancer screening using Cologuard test 04/19/2020  . Osteopenia 11/21/2019  . Encounter for screening mammogram for malignant neoplasm of breast 11/21/2019  . Hyperlipidemia 11/21/2019  . GERD (gastroesophageal reflux disease) 11/21/2019  . Joint pain 08/22/2019  . 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 Chronic lumbar radicular pain (Left) 04/18/2016  . Vitamin D deficiency 03/07/2016  . Musculoskeletal pain 02/11/2016  . Neurogenic pain 02/11/2016  . Chronic pain syndrome 02/10/2016  . Long term current use of opiate analgesic 02/10/2016  . Long term prescription opiate use 02/10/2016  . Opiate use (80-140 MME/Day) 02/10/2016  . Chronic shoulder pain (Location of Primary Source of Pain) (Bilateral) (L>R) 02/10/2016  . Chronic hip pain (Location of Secondary source of pain) (Bilateral) (L>R) 02/10/2016  . Chronic sacroiliac joint pain (Location of Tertiary source of pain) (Bilateral) (L>R) 02/10/2016  . Chronic low back pain (Bilateral) (L>R) 02/10/2016  . 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 History of lumbar fusion 02/10/2016  . Chronic lower extremity pain (Bilateral) (L>R) 02/10/2016  . Chronic knee pain (Bilateral) (L>R) 02/10/2016  . Chronic elbow pain (Bilateral) (L>R) 02/10/2016  . Chronic hand pain (Bilateral) (L>R) 02/10/2016  . History of fibrocystic disease of breast 01/08/2013  . Dry eyes, bilateral 02/06/2012  . Hypoglycemia 02/06/2012  . Hypertension 02/06/2012  . Chronic fatigue syndrome 02/06/2012  . Osteoporosis 02/06/2012  . Vaginal atrophy 02/06/2012  . Spasm of back muscles 01/31/2011  . Chronic neck pain 01/31/2011  . Depression 01/13/2011  . History of seasonal allergies 01/13/2011  . Situational disturbance 01/13/2011  . Fibromyalgia 01/12/2011    Conditions to be addressed/monitored:HTN, HLD, Anxiety, Depression and Chronic pain  and fibromyalgia  Care Plan : RNCM: Depression (Adult)  Updates made by Vanita Ingles since 05/03/2020 12:00 AM    Problem: RNCM: Depression Identification (Depression) and Anxiety   Priority: Medium    Goal: RNCM: Depressive Symptoms Identified   Note:   Current Barriers:  Marland Kitchen Knowledge Deficits related to resources to help with effective management of depression and anxiety . Chronic Disease Management support and education needs related to management of depression and anxiety  . Lacks caregiver support.  . Unable to independently manage depression and anxiety . Lacks social connections . Does not contact provider office for questions/concerns  Nurse Case Manager Clinical Goal(s):  Marland Kitchen Over the next 120 days, patient will verbalize understanding of plan for effective management of depression and anxiety . Over the next 120 days, patient will work with [facility] team and pcp to address needs related to depression and anxiety . Over the next 120 days, patient will attend all scheduled medical appointments: 05-19-2020 at 1:20 pm  Interventions:  . 1:1 collaboration with Lorine Bears Lupita Raider, FNP regarding development and update of comprehensive plan of care as evidenced by provider attestation and co-signature . Inter-disciplinary care team collaboration (see longitudinal plan  of care) . Evaluation of current treatment plan related to depression and anxiety  and patient's adherence to plan as established by provider. . Advised patient to call the office for changes in mood/anxiety/depression . Provided education to patient re: mindfulness, things to work toward to keep mood stable, support and education on effective ways to deal with stressors in life . Reviewed medications with patient and discussed the patient has switched from Cymbalta to Lexapro and can see a positive change already. She feels like it is going to help her a lot.  . Reviewed scheduled/upcoming provider appointments including:  05-19-2020 at 1:20 pm  Patient Goals/Self-Care Activities Over the next 120 days, patient will:  - Patient will self administer medications as prescribed Patient will attend all scheduled provider appointments Patient will call pharmacy for medication refills Patient will attend church or other social activities Patient will continue to perform IADL's independently Patient will call provider office for new concerns or questions Patient will work with BSW to address care coordination needs and will continue to work with the clinical team to address health care and disease management related needs.   - anxiety screen reviewed - depression screen reviewed - medication list reviewed  Follow Up Plan: Telephone follow up appointment with care management team member scheduled for: 06-28-2020 at 0945 am       Task: RNCM: Identify Depressive Symptoms and Facilitate Treatment   Note:   Care Management Activities:    - anxiety screen reviewed - depression screen reviewed - medication list reviewed       Care Plan : RNCM: Chronic Pain (Adult)  Updates made by Tate, Pamela J since 05/03/2020 12:00 AM    Problem: RNCM: Pain Management Plan (Chronic Pain)   Priority: Medium    Goal: Pain Management Plan Developed   Priority: Medium  Note:   Current Barriers:  . Knowledge Deficits related to managing acute/chronic pain . Non-adherence to scheduled provider appointments . Non-adherence to prescribed medication regimen . Difficulty obtaining medications . Chronic Disease Management support and education needs related to chronic pain . Unable to independently manage pain and discomfort . Lacks social connections . Does not contact provider office for questions/concerns  Nurse Case Manager Clinical Goal(s):  . Over the next 120 days, patient will verbalize understanding of plan for managing pain . Over the next 120 days, patient will meet with RN Care Manager to address needs  . Over the  next 120 days, patient will attend all scheduled medical appointments: 05-19-2020 at 1:20 pm . Over the next  120 days patient will demonstrate use of different relaxation  skills and/or diversional activities to assist with pain reduction (distraction, imagery, relaxation, massage, acupressure, TENS, heat, and cold application . Over the next  120  days patient will report pain at a level less than 3 to 4 on a 10-10 rating scale . Over the next   120 days patient will use pharmacological and nonpharmacological pain relief strategies . Over the next  120 days patient will verbalize acceptable level of pain relief and ability to engage in desired activities . Over the next   120 days patient will engage in desired activities without an increase in pain level  Interventions:  . Collaboration with Malfi, Nicole M, FNP regarding development and update of comprehensive plan of care as evidenced by provider attestation and co-signature . Inter-disciplinary care team collaboration (see longitudinal plan of care) . - deep breathing, relaxation and mindfulness use promoted . - enrollment in   pain education program arranged . - motivation and barriers to change assessed and addressed . - mutually acceptable comfort goal set . - pain assessed . - pain treatment goals reviewed . Evaluation of current treatment plan related to fibromyalgia and chronic pain and patient's adherence to plan as established by provider. . Advised patient to call the office for changes in pain level or intensity  . Provided education to patient re: effective pain management  . Reviewed medications with patient and discussed compliance  . Discussed plans with patient for ongoing care management follow up and provided patient with direct contact information for care management team . Allow patient to maintain a diary of pain ratings, timing, precipitating events, medications, treatments, and what works best to relieve pain,  . Refer  to support groups and self-help groups . Educate patient about the use of pharmacological interventions for pain management- antianxiety, antidepressants, NSAIDS, opioid analgesics,  . Explain the importance of lifestyle modifications to effective pain management   Patient Goals/Self Care Activities:  . Patient verbalizes understanding of plan to manage pain and discomfort related to fibromyalgia . Self-administers medications as prescribed . Attends all scheduled provider appointments . Calls pharmacy for medication refills . Calls provider office for new concerns or questions . - mutually acceptable comfort goal set . - pain assessed . - pain management plan developed . - pain treatment goals reviewed . - patient response to treatment assessed . - sharing of pain management plan with teachers and other caregivers encouraged  Follow Up Plan: Telephone follow up appointment with care management team member scheduled for: 05-19-2020 at 1:20 pm     Task: RNCM: Partner to Develop Chronic Pain Management Plan   Note:   Care Management Activities:    - mutually acceptable comfort goal set - pain assessed - pain management plan developed - pain treatment goals reviewed - patient response to treatment assessed - sharing of pain management plan with teachers and other caregivers encouraged       Care Plan : RNCM: Hypertension (Adult)  Updates made by Vanita Ingles since 05/03/2020 12:00 AM    Problem: RNCM: Hypertension (Hypertension)   Priority: Medium    Goal: RNCM: Hypertension Monitored   Priority: Medium  Note:   Objective:  . Last practice recorded BP readings:  BP Readings from Last 3 Encounters:  04/19/20 (!) 141/68  11/21/19 128/74  08/22/19 (!) 155/62 .   Marland Kitchen Most recent eGFR/CrCl: No results found for: EGFR  No components found for: CRCL Current Barriers:  Marland Kitchen Knowledge Deficits related to basic understanding of hypertension pathophysiology and self care  management . Knowledge Deficits related to understanding of medications prescribed for management of hypertension . Limited Social Support . Unable to independently manage HTN . Unable to self administer medications as prescribed . Lacks social connections . Does not contact provider office for questions/concerns Case Manager Clinical Goal(s):  Marland Kitchen Over the next 120 days, patient will verbalize understanding of plan for hypertension management . Over the next 120 days, patient will attend all scheduled medical appointments: 05-19-2020 at 1:20 pm . Over the next 120 days, patient will demonstrate improved adherence to prescribed treatment plan for hypertension as evidenced by taking all medications as prescribed, monitoring and recording blood pressure as directed, adhering to low sodium/DASH diet . Over the next 120 days, patient will demonstrate improved health management independence as evidenced by checking blood pressure as directed and notifying PCP if SBP>160 or DBP > 90, taking all medications  as prescribe, and adhering to a low sodium diet as discussed. Interventions:  . Collaboration with Malfi, Nicole M, FNP regarding development and update of comprehensive plan of care as evidenced by provider attestation and co-signature . Inter-disciplinary care team collaboration (see longitudinal plan of care) . Evaluation of current treatment plan related to hypertension self management and patient's adherence to plan as established by provider. . Provided education to patient re: stroke prevention, s/s of heart attack and stroke, DASH diet, complications of uncontrolled blood pressure . Reviewed medications with patient and discussed importance of compliance . Discussed plans with patient for ongoing care management follow up and provided patient with direct contact information for care management team . Advised patient, providing education and rationale, to monitor blood pressure daily and record,  calling PCP for findings outside established parameters.  . Reviewed scheduled/upcoming provider appointments including: 05-19-2020 at 1:20 pm Patient Goals/Self-Care Activities . Over the next 120 days, patient will:  - Self administers medications as prescribed Attends all scheduled provider appointments Calls provider office for new concerns, questions, or BP outside discussed parameters Checks BP and records as discussed Follows a low sodium diet/DASH diet - blood pressure trends reviewed - depression screen reviewed - home or ambulatory blood pressure monitoring encouraged Follow Up Plan: Telephone follow up appointment with care management team member scheduled for: 06-28-2020 at 0945 am   Task: RNCM: Identify and Monitor Blood Pressure Elevation   Note:   Care Management Activities:    - blood pressure trends reviewed - depression screen reviewed - home or ambulatory blood pressure monitoring encouraged       Care Plan : RNCM: HLD  Updates made by Tate, Pamela J since 05/03/2020 12:00 AM    Problem: RNCM: Management of HLD   Priority: Medium    Goal: RNCM: Management of HLD   Note:   Current Barriers:  . Poorly controlled hyperlipidemia, complicated by smoking, depression, episodes of Hypotension . Current antihyperlipidemic regimen: Zetia 10 mg daily, pravastatin 20 mg daily . Most recent lipid panel:     Component Value Date/Time   CHOL 259 (H) 11/21/2019 0829   TRIG 147 11/21/2019 0829   HDL 56 11/21/2019 0829   CHOLHDL 4.6 11/21/2019 0829   LDLCALC 174 (H) 11/21/2019 0829 .   . ASCVD risk enhancing conditions: age 64, HTN, current smoker . Unable to independently manage HLD . Lacks social connections . Does not contact provider office for questions/concerns  RN Care Manager Clinical Goal(s):  . Over the next 120 days, patient will work with RN Care Manager, providers, and care team towards execution of optimized self-health management plan . Over the next 120  days, patient will verbalize understanding of plan for effective management of hLD . Over the next 120 days, patient will work with RNCM, pcp, and CCM team  to address needs related to HLD . Over the next 120 days, patient will attend all scheduled medical appointments: 05-19-2020 at 1:20 pm  Interventions: . Collaboration with Malfi, Nicole M, FNP regarding development and update of comprehensive plan of care as evidenced by provider attestation and co-signature . Inter-disciplinary care team collaboration (see longitudinal plan of care) . Medication review performed; medication list updated in electronic medical record.  . Inter-disciplinary care team collaboration (see longitudinal plan of care) . Referred to pharmacy team for assistance with HLD medication management . Evaluation of current treatment plan related to HLD and patient's adherence to plan as established by provider. . Advised patient to call for   changes in condition or new questions . Reviewed medications with patient and discussed compliance  . Reviewed scheduled/upcoming provider appointments including: 05-19-2020 at 1:20 pm   Patient Goals/Self-Care Activities: . Over the next 120 days, patient will:   - call for medicine refill 2 or 3 days before it runs out - call if I am sick and can't take my medicine - keep a list of all the medicines I take; vitamins and herbals too - learn to read medicine labels - use a pillbox to sort medicine - use an alarm clock or phone to remind me to take my medicine - change to whole grain breads, cereal, pasta - drink 6 to 8 glasses of water each day - eat 5 or 6 small meals each day - fill half the plate with nonstarchy vegetables - limit fast food meals to no more than 1 per week - manage portion size - prepare main meal at home 3 to 5 days each week - read food labels for fat, fiber, carbohydrates and portion size - be open to making changes - I can manage, know and watch for signs  of a heart attack - if I have chest pain, call for help - learn about small changes that will make a big difference - learn my personal risk factors - education plan reviewed and/or amended - empathy and reassurance conveyed - family/caregiver participation in learning encouraged - health literacy screen reviewed - patient's preferred learning methods utilized - privacy ensured - questions encouraged - readiness to learn monitored   Follow Up Plan: Telephone follow up appointment with care management team member scheduled for: 06-28-2020 at 0945 am     Task: RNCM: HLD   Note:   Care Management Activities:    - education plan reviewed and/or amended - empathy and reassurance conveyed - family/caregiver participation in learning encouraged - health literacy screen reviewed - patient's preferred learning methods utilized - privacy ensured - questions encouraged - readiness to learn monitored         Plan:Telephone follow up appointment with care management team member scheduled for:  06-28-2020 at 0945 am   Pam Tate RN, MSN, CCM Community Care Coordinator [place]  [facility] Mobile: [phone]        

## 2020-05-03 NOTE — Patient Instructions (Addendum)
Visit Information  Goals Addressed              This Visit's Progress   .  COMPLETED: RN-Inital CCM Care Plan (pt-stated)        Current Barriers: Closing this goal and opening in new ELS . Chronic Disease Management support, education, and care coordination needs related to HTN, Depression, Fibromyalgia, and Chronic pain . Financial Barriers . Lack of social support   Clinical Goal(s) related to HTN, Depression, Fibromyalgia, and Chronic pain :  Over the next 120 days, patient will:  . Work with the care management team to address educational, disease management, and care coordination needs  . Call provider office for new or worsened signs and symptoms New or worsened symptom related to Chronic Pain . Call care management team with questions or concerns . Verbalize basic understanding of patient centered plan of care established today  Interventions related to HTN, Depression, Fibromyalgia, and Chronic pain : :  . Evaluation of current treatment plans and patient's adherence to plan as established by provider. 03-15-2020: The patient states that she is having a hard time with things and is trying to prioritize her health and needs right now. She states she was having issues with hypotension so she has cut her blood pressure medication in half and now her pressures are 127/76, instead of 70/40.  She is not having sx/sx of hypotension, light headedness or dizziness with cutting medication in half.  Encouraged her to talk to pcp about changes. Review of safety and concerns.  The patient is being safe.  She has been to the pain provider and he has put her back on Ultram and she is thankful for this. This takes the "edge" off of her pain.  She states that she will discuss a colonoscopy with pcp in January. She does not have the finances and feels that it came back positive due to hemorrhoids. Encouraged the patient to write down questions to ask the pcp at next visit on 04-13-2020.   . Assessed  patient understanding of disease states- The patient has a good understanding of how to manage her pain effectively. The patient says heat works the best for her and raining colder days she is in bed most of the day. The patient is not having the best of days today and rates her pain level "all over" at a 7-8.  Her goal is for her pain level to be <5. Rates pain at 7 to 8 today. Today is not a good day because of the weather being cooler than usual. When it is sunny she feels better. Endorses taking medications as ordered and using heat application.03-15-2020: The patient states that she still has good days and bad days with her pain, but is better since back on the ultram.  She is frustrated about over all health and management of her care. Expressed financial concerns and only having 70.00+ dollars left over after paying pills. Empathetic listening and support given.  . Assessed if the patient had received her blood pressure cuff yet and the patient said no.  Education provided that the patient will receive a blood pressure cuff and it should arrive soon.  The patient is happy that this was approved by [facility] for her. She wants to take her blood pressures and record them daily.03-15-2020: The patient is taking her blood pressure regularly and around 124/76, this is after she has cut her blood pressure medication in half.  Before this she states it was running  70/40 and it was hard for her to even function. Encouraged collaboration with the pcp.  . Evaluation of depression. The patient was a little upset today due to her car being in the shop and the repair was not covered under her extended warranty. The patient was going to have to pay 300.00.  The patients sister allowed her to use her credit card for the bill and she will make monthly payments until she has paid her sister back. The patient was thankful for this. Empathetic listening. 03-15-2020: The patient is receptive to talking to the care guides about help  with food resources. Referral placed for meal delivery options and/or gift card for the patient for food.  She states she makes 18.00 over the limit to get additional help.  Food is a big expense for her. Is receptive to help.  Also states her power bill has increased from 50 dollars a month to 97.   . Assessed patient's education and care coordination needs: The patient is smoking still. Has tried to quit before. Was successful with Chantix but cost prohibitive- well discuss options with CCM team Pharmacist.  Also will provide educational material by EMMI and My Chart system. Will use the email: [email or web address] .net.  The patient states she is doing good when she can be active but when she can not be active due to the pain she smokes more. 03-15-2020: The patient is smoking less now with having better pain control. The patient states that she always smokes less when she is more active. Praised for positive changes.  . Provided disease specific education to patient- review of Heart Healthy diet.  The patient likes vegetables and does not fry foods. 03-15-2020: Is receptive to talking to care guides for help with food resources. [name] with appropriate clinical care team members regarding patient needs.  Knows about the CCM team and care guide support. . Referral to CCM LCSW for patient reports of this being a difficult time of year for her related to grief, low social support and struggle with depression- LCSW is currently working with the patient . Referral to CCM pharmacist. The patient was getting medication help at one time but then lost it due to having a part time job. The patient can no longer do her part time job and is interested in reapplying for medication help. 03-15-2020: Will ask the pharmacist for assistance due to medication compliance and education.   Patient Self Care Activities related to HTN, Depression, Fibromyalgia, and Chronic pain :  . Patient would benefit from support to   self-manage chronic health conditions  Please see past updates related to this goal by clicking on the "Past Updates" button in the selected goal         The patient verbalized understanding of instructions, educational materials, and care plan provided today and declined offer to receive copy of patient instructions, educational materials, and care plan.   Telephone follow up appointment with care management team member scheduled for: 06-28-2020 at [place] am  April Larsson RN, MSN, [place] Mobile: [phone]

## 2020-05-06 ENCOUNTER — Ambulatory Visit: Payer: Self-pay | Admitting: Licensed Clinical Social Worker

## 2020-05-06 DIAGNOSIS — I10 Essential (primary) hypertension: Secondary | ICD-10-CM

## 2020-05-06 DIAGNOSIS — M461 Sacroiliitis, not elsewhere classified: Secondary | ICD-10-CM | POA: Diagnosis not present

## 2020-05-06 DIAGNOSIS — G894 Chronic pain syndrome: Secondary | ICD-10-CM | POA: Diagnosis not present

## 2020-05-06 DIAGNOSIS — E785 Hyperlipidemia, unspecified: Secondary | ICD-10-CM

## 2020-05-06 DIAGNOSIS — F3289 Other specified depressive episodes: Secondary | ICD-10-CM

## 2020-05-06 DIAGNOSIS — F419 Anxiety disorder, unspecified: Secondary | ICD-10-CM

## 2020-05-06 DIAGNOSIS — M797 Fibromyalgia: Secondary | ICD-10-CM

## 2020-05-06 NOTE — Chronic Care Management (AMB) (Signed)
Chronic Care Management    Clinical Social Work Note  05/06/2020 Name: April Price MRN: [id number] DOB: 1956-01-31  April Price is a 65 y.o. year old female who is a primary care patient of Lorine Bears, Lupita Raider, FNP. The CCM team was consulted to assist the patient with chronic disease management and/or care coordination needs related to: Mental Health Counseling and Resources.   Engaged with patient by telephone for follow up visit in response to provider referral for social work chronic care management and care coordination services.   Consent to Services:  The patient was given the following information about Chronic Care Management services today, agreed to services, and gave verbal consent: 1. CCM service includes personalized support from designated clinical staff supervised by the primary care provider, including individualized plan of care and coordination with other care providers 2. 24/7 contact phone numbers for assistance for urgent and routine care needs. 3. Service will only be billed when office clinical staff spend 20 minutes or more in a month to coordinate care. 4. Only one practitioner may furnish and bill the service in a calendar month. 5.The patient may stop CCM services at any time (effective at the end of the month) by phone call to the office staff. 6. The patient will be responsible for cost sharing (co-pay) of up to 20% of the service fee (after annual deductible is met). Patient agreed to services and consent obtained.  Patient agreed to services and consent obtained.   Assessment: Review of patient past medical history, allergies, medications, and health status, including review of relevant consultants reports was performed today as part of a comprehensive evaluation and provision of chronic care management and care coordination services.     SDOH (Social Determinants of Health) assessments and interventions performed:    Advanced Directives Status: See  Care Plan for related entries.  CCM Care Plan  Allergies  Allergen Reactions  . Penicillins Anaphylaxis    Has patient had a PCN reaction causing immediate rash, facial/tongue/throat swelling, SOB or lightheadedness with hypotension: Yes Has patient had a PCN reaction causing severe rash involving mucus membranes or skin necrosis: No Has patient had a PCN reaction that required hospitalization No Has patient had a PCN reaction occurring within the last 10 years: Yes If all of the above answers are "NO", then may proceed with Cephalosporin use.   . Tylenol [Acetaminophen] Anaphylaxis, Itching and Hives    Ears and throat itch   . Morphine And Related Other (See Comments)    Depression   . Nickel Itching  . Pepcid [Famotidine] Nausea And Vomiting  . Shrimp [Shellfish Allergy] Hives  . Tape     irritation  . Tetanus Toxoids Other (See Comments)    Swelling at injection site and fever  . Wheat Bran   . Corn-Containing Products     Has hay fever and swelling in fingers.  . Gabapentin Other (See Comments)    Makes her feel "spacy" off balance, like she is coming out of anesthesia.     Outpatient Encounter Medications as of 05/06/2020  Medication Sig  . albuterol (PROVENTIL HFA;VENTOLIN HFA) 108 (90 Base) MCG/ACT inhaler Inhale 2 puffs into the lungs every 4 (four) hours as needed for wheezing or shortness of breath.  Marland Kitchen alendronate (FOSAMAX) 70 MG tablet Take 1 tablet (70 mg total) by mouth every 7 (seven) days. Take with a full glass of water on an empty stomach.  Marland Kitchen azelastine (ASTELIN) 0.1 % nasal spray Place 2  sprays into both nostrils 2 (two) times daily as needed for rhinitis. Use in each nostril as directed  . busPIRone (BUSPAR) 10 MG tablet Take 1 tablet (10 mg total) by mouth 3 (three) times daily.  Marland Kitchen EPINEPHrine (EPIPEN 2-PAK) 0.3 mg/0.3 mL IJ SOAJ injection Inject 0.3 mLs (0.3 mg total) into the muscle once for 1 dose.  . escitalopram (LEXAPRO) 5 MG tablet Take 1 tablet (5 mg  total) by mouth daily.  Marland Kitchen esomeprazole (NEXIUM) 40 MG capsule TAKE 1 CAPSULE EVERY DAY  . ezetimibe (ZETIA) 10 MG tablet Take 1 tablet (10 mg total) by mouth daily.  . fluticasone (FLONASE) 50 MCG/ACT nasal spray Place 2 sprays into both nostrils daily.  . meclizine (ANTIVERT) 25 MG tablet Take 1 tablet (25 mg total) by mouth 3 (three) times daily as needed for dizziness.  . montelukast (SINGULAIR) 10 MG tablet Take 1 tablet (10 mg total) by mouth at bedtime.  . Multiple Vitamins-Minerals (PRESERVISION AREDS 2 PO) Take by mouth.  . pravastatin (PRAVACHOL) 20 MG tablet Take 1 tablet (20 mg total) by mouth daily.  Marland Kitchen tiZANidine (ZANAFLEX) 4 MG tablet TAKE ONE TABLET FOUR TIMES DAILY AS NEEDED FOR MUSCLE SPASM  . traMADol (ULTRAM) 50 MG tablet Take 50 mg by mouth every 8 (eight) hours as needed.  . Vitamin D, Ergocalciferol, (DRISDOL) 1.25 MG (50000 UNIT) CAPS capsule Take 1 capsule (50,000 Units total) by mouth every 7 (seven) days.   No facility-administered encounter medications on file as of 05/06/2020.    Patient Active Problem List   Diagnosis Date Noted  . Positive colorectal cancer screening using Cologuard test 04/19/2020  . Osteopenia 11/21/2019  . Encounter for screening mammogram for malignant neoplasm of breast 11/21/2019  . Hyperlipidemia 11/21/2019  . GERD (gastroesophageal reflux disease) 11/21/2019  . Joint pain 08/22/2019  . 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 Chronic lumbar radicular pain (Left) 04/18/2016  . Vitamin D deficiency 03/07/2016  . Musculoskeletal pain 02/11/2016  . Neurogenic pain 02/11/2016  . Chronic pain syndrome 02/10/2016  . Long term current use of opiate analgesic 02/10/2016  . Long term prescription opiate use 02/10/2016  . Opiate use (80-140 MME/Day) 02/10/2016  . Chronic shoulder pain (Location of Primary Source of Pain) (Bilateral) (L>R) 02/10/2016  . Chronic hip pain (Location of Secondary source of pain) (Bilateral) (L>R) 02/10/2016  .  Chronic sacroiliac joint pain (Location of Tertiary source of pain) (Bilateral) (L>R) 02/10/2016  . Chronic low back pain (Bilateral) (L>R) 02/10/2016  . 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 History of lumbar fusion 02/10/2016  . Chronic lower extremity pain (Bilateral) (L>R) 02/10/2016  . Chronic knee pain (Bilateral) (L>R) 02/10/2016  . Chronic elbow pain (Bilateral) (L>R) 02/10/2016  . Chronic hand pain (Bilateral) (L>R) 02/10/2016  . History of fibrocystic disease of breast 01/08/2013  . Dry eyes, bilateral 02/06/2012  . Hypoglycemia 02/06/2012  . Hypertension 02/06/2012  . Chronic fatigue syndrome 02/06/2012  . Osteoporosis 02/06/2012  . Vaginal atrophy 02/06/2012  . Spasm of back muscles 01/31/2011  . Chronic neck pain 01/31/2011  . Depression 01/13/2011  . History of seasonal allergies 01/13/2011  . Situational disturbance 01/13/2011  . Fibromyalgia 01/12/2011    Conditions to be addressed/monitored: Depression; Limited social support, Level of care concerns, Mental Health Concerns , Social Isolation and Limited access to caregiver  Care Plan : General Social Work (Adult)  Updates made by Greg Cutter, LCSW since 05/06/2020 12:00 AM    Problem: Quality of Life (General Plan  of Care)     Long-Range Goal: Quality of Life Maintained   Start Date: 04/15/2020  Priority: Medium  Note:   Evidence-based guidance:   Assess patient's thoughts about quality of life, goals and expectations, and dissatisfaction or desire to improve.   Identify issues of primary importance such as mental health, illness, exercise tolerance, pain, sexual function and intimacy, cognitive change, social isolation, finances and relationships.   Assess and monitor for signs/symptoms of psychosocial concerns, especially depression or ideations regarding harm to others or self; provide or refer for mental health services as needed.   Identify sensory issues that impact quality of life such as  hearing loss, vision deficit; strategize ways to maintain or improve hearing, vision.   Promote access to services in the community to support independence such as support groups, home visiting programs, financial assistance, handicapped parking tags, durable medical equipment and emergency responder.   Promote activities to decrease social isolation such as group support or social, leisure and recreational activities, employment, use of social media; consider safety concerns about being out of home for activities.   Provide patient an opportunity to share by storytelling or a "life review" to give positive meaning to life and to assist with coping and negative experiences.   Encourage patient to tap into hope to improve sense of self.   Counsel based on prognosis and as early as possible about end-of-life and palliative care; consider referral to palliative care provider.   Advocate for the development of palliative care plan that may include avoidance of unnecessary testing and intervention, symptom control, discontinuation of medications, hospice and organ donation.   Counsel as early as possible those with life-limiting chronic disease about palliative care; consider referral to palliative care provider.   Advocate for the development of palliative care plan.   Notes:   Timeframe:  Long-Range Goal Priority:  Medium Start Date: 04/15/20 , readjusted on 05/06/20                   Expected End Date: 06/13/20                      Follow Up Date - 90 days from 05/06/20   - avoid negative self-talk - develop a personal safety plan - develop a plan to deal with triggers like holidays, anniversaries - exercise at least 2 to 3 times per week - have a plan for how to handle bad days - journal feelings and what helps to feel better or worse - spend time or talk with others at least 2 to 3 times per week - spend time or talk with others every day - watch for early signs of feeling worse - write  in journal every day    Why is this important?    Keeping track of your progress will help your treatment team find the right mix of medicine and therapy for you.   Write in your journal every day.   Day-to-day changes in depression symptoms are normal. It may be more helpful to check your progress at the end of each week instead of every day.     Current Barriers:  . Chronic Mental Health needs related to pain and major depressive disorder . Financial constraints related to managing health care expenses . Limited social support . ADL IADL limitations . Mental Health Concerns  . Limited access to caregiver . Suicidal Ideation/Homicidal Ideation: No  Clinical Social Work Goal(s):  Marland Kitchen Over the next 120  days, patient will work with SW bi-monthly by telephone or in person to reduce or manage symptoms related to chronic pain and depression . Over the next 120 days, patient will work with SW to address concerns related to ongoing stress, isolation, pain and depressive symptoms  Interventions: . Patient interviewed and appropriate assessments performed: brief mental health assessment . Patient reports ongoing chronic pain and has not been able to find any relief. Patient is interested in a possible rheumatologist referral. Patient was encouraged to discuss this with PCP during next office visit. Patient is currently seeing a pain management physician. Patient reports that she met new pain physician and received trigger point injections that have been helpful with managing her pain. Patient reports that her pain is currently manageable even with the cold weather but she had a bad day yesterday on 05/05/20. Patient reports that she will receive injections directly into her SS joints on 05/24/20.  . Patient completed all COVID vaccinations. Marland Kitchen LCSW discussed coping skills for anxiety. SW used empathetic and active and reflective listening, validated patient's feelings/concerns, and provided emotional  support. LCSW provided self-care education to help manage her multiple health conditions and improve her mood. Patient was put on Lexapro on 04/19/20 by PCP to help manage her depression management.  . Provided patient with information about available mental health support resources within the area . Discussed plans with patient for ongoing care management follow up and provided patient with direct contact information for care management team . Advised patient to contact CCM Social Worker for urgent mental health needs/case management needs . Assisted patient/caregiver with obtaining information about health plan benefits . A voluntary and extensive discussion about advanced care planning including explanation and discussion of advanced was undertaken with the patient.  Explanation regarding healthcare proxy and living will was reviewed. . Patient admits that she could be more active. Healthy self-care education provided. Patient does not wish to return to the [facility] until COVID has calmed down. . Provided education to patient/caregiver about Hospice and/or Palliative Care services . Brief CBT provided throughout entire session. Patient does not want psychiatric referral at this time.  . Active listening utilized, counseling provided, current coping strategies identified, decision-making supported, healthy lifestyle promoted, journaling promoted, meditative movement therapy encouraged, mindfulness encouraged, participation in counseling encouraged, problem-solving facilitated, relaxation techniques promoted, self-reflection promoted, spiritual activities promoted, verbalization of feelings encouraged, affirmation provided, community involvement promoted, expression of thoughts about present/future encouraged, independence in all possible areas promote, life review by storytelling encouraged, patient strengths promoted, psychosocial concerns monitored, self-expression encouraged, sleep diary encouraged, sleep  hygiene techniques encouraged, social relationships promoted, strategies to maintain hearing and/or vision promoted and wellness behaviors promoted . Patient reports that her blood pressure has been good lately and that she keeps track of her blood pressure log daily.  . Patient reports that she has been staying at home often to prevent getting COVID but went out today to get her car serviced. Patient reports that it feels good to be out of the house today with the sun shining.  . Patient reports that the weather affects her pain and she has been able to be more active with the nice weather and limited chronic pain. Patient has been doing [employer].  . Patient reports concern regarding billing for CCM Services. LCSW provided education centered around [facility] management and our services. Patient prefers 90 day follow up calls from entire team due to limited finances. LCSW updated entire CCM team and PCP.  Marland Kitchen  She shares that her pain doctor found burn marks on her back from using her heating pad at night to alleviate her pain. Pain management coping skill education provided.  . Patient has a sick friend in the hospital which is causing an increase in her stress and anxiety because she is worried about her well-being.  . Patient was upset that she was taken off of ibuprofen due to kidney side effects but shares that her pain doctor prescribed her tramadol for pain relief.  . Patient was positive for color guard is aware that she needs to address this concern but does not wish to discuss or address this until after COVID settles down.   Patient Self Care Activities:  . Ability for insight . Motivation for treatment . Strong family or social support  Patient Coping Strengths:  . Hopefulness . Self Advocate . Able to Communicate Effectively  Patient Self Care Deficits:  . Lacks social connections  Please see past updates related to this goal by clicking on the "Past Updates" button  in the selected goal        Follow Up Plan: SW will follow up with patient by phone over the next quarter      Eula Fried, [place], MSW, [email or web address] Phone: [phone]

## 2020-05-19 ENCOUNTER — Other Ambulatory Visit: Payer: Self-pay

## 2020-05-19 ENCOUNTER — Ambulatory Visit (INDEPENDENT_AMBULATORY_CARE_PROVIDER_SITE_OTHER): Payer: PPO | Admitting: Family Medicine

## 2020-05-19 ENCOUNTER — Encounter: Payer: Self-pay | Admitting: Family Medicine

## 2020-05-19 VITALS — BP 182/80 | HR 98 | Ht 64.0 in | Wt 173.0 lb

## 2020-05-19 DIAGNOSIS — F330 Major depressive disorder, recurrent, mild: Secondary | ICD-10-CM | POA: Diagnosis not present

## 2020-05-19 DIAGNOSIS — F3289 Other specified depressive episodes: Secondary | ICD-10-CM | POA: Diagnosis not present

## 2020-05-19 MED ORDER — ESCITALOPRAM OXALATE 5 MG PO TABS: 5.0000 mg | ORAL_TABLET | Freq: Every day | ORAL | 1 refills | Status: DC

## 2020-05-19 NOTE — Patient Instructions (Addendum)
Thank you for coming to the office today.  Refilled the Escitalopram (lexapro) 5mg  - for 90 day supply with refill  Shingrix is new shingles vaccine 2 doses from pharmacy if interested, it is optional  New Pneumovax eventually, after 65  High dose flu shot after 65+  Please schedule a Follow-up Appointment to: Return in about 3 months (around 08/16/2020) for 3 month depression follow-up w/ new provider.  If you have any other questions or concerns, please feel free to call the office or send a message through [place]. You may also schedule an earlier appointment if necessary.  Additionally, you may be receiving a survey about your experience at our office within a few days to 1 week by e-mail or mail. We value your feedback.  April Putnam, DO [place]

## 2020-05-19 NOTE — Progress Notes (Signed)
Subjective:    Patient ID: April Price, female    DOB: [birth year], 65 y.o.   MRN: [id number]  April Price is a 65 y.o. female presenting on 05/19/2020 for Depression (Taking Lexapro )  Previous PCP Cyndia Skeeters, FNP   HPI  Major Depression,  Last visit with prior PCP 04/19/20, she was evaluated for depression and she was taken off Cymbalta and now switched to Escitalopram 5mg . She had done well with this change, some pain increased off Cymbalta.  She expresses a lot of concerns and life stressors with family and friends. She is trying to bear the burden of them during tough times and it worsens her stress. She is improved on Escitalopram but says her stress is overwhelming now and does not want to change dose. She feels it is still helping and she acknowledges the stressors will pass.  Next Monday she has planned another round of SI joint injections with pain specialist in [place]. Previous injections were helpful.    Depression screen [facility] 2/9 05/19/2020 11/21/2019 07/25/2019  Decreased Interest 0 0 0  Down, Depressed, Hopeless 1 0 0  PHQ - 2 Score 1 0 0  Altered sleeping 0 2 0  Tired, decreased energy 2 1 3   Change in appetite 0 0 0  Feeling bad or failure about yourself  0 0 0  Trouble concentrating 1 0 1  Moving slowly or fidgety/restless 0 0 0  Suicidal thoughts 0 0 0  PHQ-9 Score 4 3 4   Difficult doing work/chores Not difficult at all Somewhat difficult Not difficult at all  Some recent data might be hidden   GAD 7 : Generalized Anxiety Score 05/19/2020 11/21/2019 10/01/2018 08/29/2018  Nervous, Anxious, on Edge 0 0 (No Data) 0  Control/stop worrying 1 0 - 0  Worry too much - different things 1 0 - 0  Trouble relaxing 1 0 - 2  Restless 0 0 - 0  Easily annoyed or irritable 1 0 - 1  Afraid - awful might happen 0 0 - 0  Total GAD 7 Score 4 0 - 3  Anxiety Difficulty Somewhat difficult Not difficult at all - Not difficult at all    Social History    Tobacco Use  . Smoking status: Current Every Day Smoker    Packs/day: 0.50    Years: 40.00    Pack years: 20.00    Types: Cigarettes  . Smokeless tobacco: Never Used  Vaping Use  . Vaping Use: Never used  Substance Use Topics  . Alcohol use: Never  . Drug use: Never    Review of Systems Per HPI unless specifically indicated above     Objective:    BP (!) 182/80   Pulse 98   Ht 5\' 4"  (1.626 m)   Wt 173 lb (78.5 kg)   SpO2 97%   BMI 29.70 kg/m   Wt Readings from Last 3 Encounters:  05/19/20 173 lb (78.5 kg)  04/19/20 170 lb 6.4 oz (77.3 kg)  11/21/19 177 lb 12.8 oz (80.6 kg)    Physical Exam Vitals and nursing note reviewed.  Constitutional:      General: She is not in acute distress.    Appearance: She is well-developed and well-nourished. She is not diaphoretic.     Comments: Well-appearing, comfortable, cooperative  HENT:     Head: Normocephalic and atraumatic.     Mouth/Throat:     Mouth: Oropharynx is clear and moist.  Eyes:     General:  Right eye: No discharge.        Left eye: No discharge.     Conjunctiva/sclera: Conjunctivae normal.  Cardiovascular:     Rate and Rhythm: Normal rate.  Pulmonary:     Effort: Pulmonary effort is normal.  Musculoskeletal:        General: No edema.  Skin:    General: Skin is warm and dry.     Findings: No erythema or rash.  Neurological:     Mental Status: She is alert and oriented to person, place, and time.  Psychiatric:        Mood and Affect: Mood and affect normal.        Behavior: Behavior normal.     Comments: Well groomed, good eye contact, normal speech and thoughts    Results for orders placed or performed in visit on 02/19/20  Cologuard  Result Value Ref Range   Cologuard Positive (A) Negative      Assessment & Plan:   Problem List Items Addressed This Visit    Depression - Primary   Relevant Medications   escitalopram (LEXAPRO) 5 MG tablet      Depression, improved on SSRI  Lexapro, continue current low dose at 5mg , she declines dose increase today Life stressors affecting her and she is hopeful that things will improve Future consider refer to therapist / psych if indicated Consider other med options as well.    Meds ordered this encounter  Medications  . escitalopram (LEXAPRO) 5 MG tablet    Sig: Take 1 tablet (5 mg total) by mouth daily.    Dispense:  90 tablet    Refill:  1      Follow up plan: Return in about 3 months (around 08/16/2020) for 3 month depression follow-up w/ new provider.   Nobie Putnam, [facility] 05/19/2020, 1:28 PM

## 2020-05-31 DIAGNOSIS — M461 Sacroiliitis, not elsewhere classified: Secondary | ICD-10-CM | POA: Diagnosis not present

## 2020-06-01 ENCOUNTER — Ambulatory Visit (INDEPENDENT_AMBULATORY_CARE_PROVIDER_SITE_OTHER): Payer: PPO

## 2020-06-01 VITALS — Ht 64.0 in | Wt 170.0 lb

## 2020-06-01 DIAGNOSIS — Z0000 Encounter for general adult medical examination without abnormal findings: Secondary | ICD-10-CM | POA: Diagnosis not present

## 2020-06-01 NOTE — Patient Instructions (Signed)
April Price , Thank you for taking time to come for your Medicare Wellness Visit. I appreciate your ongoing commitment to your health goals. Please review the following plan we discussed and let me know if I can assist you in the future.   Screening recommendations/referrals: Colonoscopy: declines due to cost at this time Mammogram: completed 03/17/2020 Bone Density: completed 03/17/2020, due 03/17/2022 Recommended yearly ophthalmology/optometry visit for glaucoma screening and checkup Recommended yearly dental visit for hygiene and checkup  Vaccinations: Influenza vaccine: completed 03/03/2020, due 11/08/2020 Pneumococcal vaccine: n/a Tdap vaccine: allergy Shingles vaccine: discussed  Covid-19:  01/09/2020, 06/04/2019, 05/14/2019  Advanced directives: Please bring a copy of your POA (Power of Attorney) and/or Living Will to your next appointment.   Conditions/risks identified: smoking  Next appointment: Follow up in one year for your annual wellness visit.   Preventive Care 40-64 Years, Female Preventive care refers to lifestyle choices and visits with your health care provider that can promote health and wellness. What does preventive care include?  A yearly physical exam. This is also called an annual well check.  Dental exams once or twice a year.  Routine eye exams. Ask your health care provider how often you should have your eyes checked.  Personal lifestyle choices, including:  Daily care of your teeth and gums.  Regular physical activity.  Eating a healthy diet.  Avoiding tobacco and drug use.  Limiting alcohol use.  Practicing safe sex.  Taking low-dose aspirin daily starting at age 39.  Taking vitamin and mineral supplements as recommended by your health care provider. What happens during an annual well check? The services and screenings done by your health care provider during your annual well check will depend on your age, overall health, lifestyle risk factors,  and family history of disease. Counseling  Your health care provider may ask you questions about your:  Alcohol use.  Tobacco use.  Drug use.  Emotional well-being.  Home and relationship well-being.  Sexual activity.  Eating habits.  Work and work Astronomer.  Method of birth control.  Menstrual cycle.  Pregnancy history. Screening  You may have the following tests or measurements:  Height, weight, and BMI.  Blood pressure.  Lipid and cholesterol levels. These may be checked every 5 years, or more frequently if you are over 36 years old.  Skin check.  Lung cancer screening. You may have this screening every year starting at age 66 if you have a 30-pack-year history of smoking and currently smoke or have quit within the past 15 years.  Fecal occult blood test (FOBT) of the stool. You may have this test every year starting at age 6.  Flexible sigmoidoscopy or colonoscopy. You may have a sigmoidoscopy every 5 years or a colonoscopy every 10 years starting at age 7.  Hepatitis C blood test.  Hepatitis B blood test.  Sexually transmitted disease (STD) testing.  Diabetes screening. This is done by checking your blood sugar (glucose) after you have not eaten for a while (fasting). You may have this done every 1-3 years.  Mammogram. This may be done every 1-2 years. Talk to your health care provider about when you should start having regular mammograms. This may depend on whether you have a family history of breast cancer.  BRCA-related cancer screening. This may be done if you have a family history of breast, ovarian, tubal, or peritoneal cancers.  Pelvic exam and Pap test. This may be done every 3 years starting at age 66. Starting at age  30, this may be done every 5 years if you have a Pap test in combination with an HPV test.  Bone density scan. This is done to screen for osteoporosis. You may have this scan if you are at high risk for osteoporosis. Discuss  your test results, treatment options, and if necessary, the need for more tests with your health care provider. Vaccines  Your health care provider may recommend certain vaccines, such as:  Influenza vaccine. This is recommended every year.  Tetanus, diphtheria, and acellular pertussis (Tdap, Td) vaccine. You may need a Td booster every 10 years.  Zoster vaccine. You may need this after age 57.  Pneumococcal 13-valent conjugate (PCV13) vaccine. You may need this if you have certain conditions and were not previously vaccinated.  Pneumococcal polysaccharide (PPSV23) vaccine. You may need one or two doses if you smoke cigarettes or if you have certain conditions. Talk to your health care provider about which screenings and vaccines you need and how often you need them. This information is not intended to replace advice given to you by your health care provider. Make sure you discuss any questions you have with your health care provider. Document Released: 04/23/2015 Document Revised: 12/15/2015 Document Reviewed: 01/26/2015 Elsevier Interactive Patient Education  2017 [place] Prevention in the Home Falls can cause injuries. They can happen to people of all ages. There are many things you can do to make your home safe and to help prevent falls. What can I do on the outside of my home?  Regularly fix the edges of walkways and driveways and fix any cracks.  Remove anything that might make you trip as you walk through a door, such as a raised step or threshold.  Trim any bushes or trees on the path to your home.  Use bright outdoor lighting.  Clear any walking paths of anything that might make someone trip, such as rocks or tools.  Regularly check to see if handrails are loose or broken. Make sure that both sides of any steps have handrails.  Any raised decks and porches should have guardrails on the edges.  Have any leaves, snow, or ice cleared regularly.  Use sand  or salt on walking paths during winter.  Clean up any spills in your garage right away. This includes oil or grease spills. What can I do in the bathroom?  Use night lights.  Install grab bars by the toilet and in the tub and shower. Do not use towel bars as grab bars.  Use non-skid mats or decals in the tub or shower.  If you need to sit down in the shower, use a plastic, non-slip stool.  Keep the floor dry. Clean up any water that spills on the floor as soon as it happens.  Remove soap buildup in the tub or shower regularly.  Attach bath mats securely with double-sided non-slip rug tape.  Do not have throw rugs and other things on the floor that can make you trip. What can I do in the bedroom?  Use night lights.  Make sure that you have a light by your bed that is easy to reach.  Do not use any sheets or blankets that are too big for your bed. They should not hang down onto the floor.  Have a firm chair that has side arms. You can use this for support while you get dressed.  Do not have throw rugs and other things on the floor that can  make you trip. What can I do in the kitchen?  Clean up any spills right away.  Avoid walking on wet floors.  Keep items that you use a lot in easy-to-reach places.  If you need to reach something above you, use a strong step stool that has a grab bar.  Keep electrical cords out of the way.  Do not use floor polish or wax that makes floors slippery. If you must use wax, use non-skid floor wax.  Do not have throw rugs and other things on the floor that can make you trip. What can I do with my stairs?  Do not leave any items on the stairs.  Make sure that there are handrails on both sides of the stairs and use them. Fix handrails that are broken or loose. Make sure that handrails are as long as the stairways.  Check any carpeting to make sure that it is firmly attached to the stairs. Fix any carpet that is loose or worn.  Avoid  having throw rugs at the top or bottom of the stairs. If you do have throw rugs, attach them to the floor with carpet tape.  Make sure that you have a light switch at the top of the stairs and the bottom of the stairs. If you do not have them, ask someone to add them for you. What else can I do to help prevent falls?  Wear shoes that:  Do not have high heels.  Have rubber bottoms.  Are comfortable and fit you well.  Are closed at the toe. Do not wear sandals.  If you use a stepladder:  Make sure that it is fully opened. Do not climb a closed stepladder.  Make sure that both sides of the stepladder are locked into place.  Ask someone to hold it for you, if possible.  Clearly mark and make sure that you can see:  Any grab bars or handrails.  First and last steps.  Where the edge of each step is.  Use tools that help you move around (mobility aids) if they are needed. These include:  Canes.  Walkers.  Scooters.  Crutches.  Turn on the lights when you go into a dark area. Replace any light bulbs as soon as they burn out.  Set up your furniture so you have a clear path. Avoid moving your furniture around.  If any of your floors are uneven, fix them.  If there are any pets around you, be aware of where they are.  Review your medicines with your doctor. Some medicines can make you feel dizzy. This can increase your chance of falling. Ask your doctor what other things that you can do to help prevent falls. This information is not intended to replace advice given to you by your health care provider. Make sure you discuss any questions you have with your health care provider. Document Released: 01/21/2009 Document Revised: 09/02/2015 Document Reviewed: 05/01/2014 Elsevier Interactive Patient Education  2017 [employer].

## 2020-06-01 NOTE — Progress Notes (Signed)
I connected with [name] today by telephone and verified that I am speaking with the correct person using two identifiers. Location patient: home Location provider: work Persons participating in the virtual visit: Licia Harl, Glenna Durand LPN.   I discussed the limitations, risks, security and privacy concerns of performing an evaluation and management service by telephone and the availability of in person appointments. I also discussed with the patient that there may be a patient responsible charge related to this service. The patient expressed understanding and verbally consented to this telephonic visit.    Interactive audio and video telecommunications were attempted between this provider and patient, however failed, due to patient having technical difficulties OR patient did not have access to video capability.  We continued and completed visit with audio only.     Vital signs may be patient reported or missing.  Subjective:   April Price is a 65 y.o. female who presents for Medicare Annual (Subsequent) preventive examination.  Review of Systems     Cardiac Risk Factors include: dyslipidemia;hypertension;sedentary lifestyle;smoking/ tobacco exposure     Objective:    Today's Vitals   06/01/20 0856 06/01/20 0857  Weight: 170 lb (77.1 kg)   Height: $Remove'5\' 4"'sgsKaQK$  (1.626 m)   PainSc:  3    Body mass index is 29.18 kg/m.  Advanced Directives 06/01/2020 05/27/2019 05/21/2018 02/13/2017 12/06/2016 07/25/2016 06/15/2016  Does Patient Have a Medical Advance Directive? Yes Yes Yes Yes No No No  Type of Paramedic of [place];Living will Healthcare Power of Attorney Living will;Healthcare Power of [place];Living will - - -  Does patient want to make changes to medical advance directive? - - - - - - -  Copy of [place] in Chart? No - copy requested - No - copy requested No - copy requested - - -  Would  patient like information on creating a medical advance directive? - - - - - - -    Current Medications (verified) Outpatient Encounter Medications as of 06/01/2020  Medication Sig  . albuterol (PROVENTIL HFA;VENTOLIN HFA) 108 (90 Base) MCG/ACT inhaler Inhale 2 puffs into the lungs every 4 (four) hours as needed for wheezing or shortness of breath.  Marland Kitchen alendronate (FOSAMAX) 70 MG tablet Take 1 tablet (70 mg total) by mouth every 7 (seven) days. Take with a full glass of water on an empty stomach.  Marland Kitchen azelastine (ASTELIN) 0.1 % nasal spray Place 2 sprays into both nostrils 2 (two) times daily as needed for rhinitis. Use in each nostril as directed  . Cholecalciferol (VITAMIN D3) 50 MCG (2000 UT) TABS Take 1 tablet by mouth daily.  Marland Kitchen escitalopram (LEXAPRO) 5 MG tablet Take 1 tablet (5 mg total) by mouth daily.  Marland Kitchen esomeprazole (NEXIUM) 40 MG capsule TAKE 1 CAPSULE EVERY DAY  . ezetimibe (ZETIA) 10 MG tablet Take 1 tablet (10 mg total) by mouth daily.  . fluticasone (FLONASE) 50 MCG/ACT nasal spray Place 2 sprays into both nostrils daily.  . meclizine (ANTIVERT) 25 MG tablet Take 1 tablet (25 mg total) by mouth 3 (three) times daily as needed for dizziness.  . montelukast (SINGULAIR) 10 MG tablet Take 1 tablet (10 mg total) by mouth at bedtime.  . Multiple Vitamins-Minerals (PRESERVISION AREDS 2 PO) Take by mouth.  . pravastatin (PRAVACHOL) 20 MG tablet Take 1 tablet (20 mg total) by mouth daily.  Marland Kitchen tiZANidine (ZANAFLEX) 4 MG tablet TAKE ONE TABLET FOUR TIMES DAILY AS NEEDED FOR  MUSCLE SPASM  . traMADol (ULTRAM) 50 MG tablet Take 50 mg by mouth every 8 (eight) hours as needed.  . busPIRone (BUSPAR) 10 MG tablet Take 1 tablet (10 mg total) by mouth 3 (three) times daily. (Patient not taking: Reported on 06/01/2020)  . EPINEPHrine (EPIPEN 2-PAK) 0.3 mg/0.3 mL IJ SOAJ injection Inject 0.3 mLs (0.3 mg total) into the muscle once for 1 dose.  . Vitamin D, Ergocalciferol, (DRISDOL) 1.25 MG (50000 UNIT) CAPS  capsule Take 1 capsule (50,000 Units total) by mouth every 7 (seven) days. (Patient not taking: Reported on 06/01/2020)   No facility-administered encounter medications on file as of 06/01/2020.    Allergies (verified) Penicillins, Tylenol [acetaminophen], Morphine and related, Nickel, Pepcid [famotidine], Shrimp [shellfish allergy], Tape, Tetanus toxoids, Wheat bran, Corn-containing products, and Gabapentin   History: Past Medical History:  Diagnosis Date  . Allergy   . Anxiety   . Arthritis   . Asthma   . Cataract    surgery both eyes in 2011  . Chronic fatigue   . Depression   . Depression screen   . Diffuse cystic mastopathy   . Fibromyalgia   . Fibromyalgia muscle pain   . GERD (gastroesophageal reflux disease)   . Heart murmur   . History of kidney stones   . Hyperlipidemia 11/21/2019  . Hypertension    driven by stress and pain   Past Surgical History:  Procedure Laterality Date  . ABDOMINAL HYSTERECTOMY    . BACK SURGERY     Spinal Fusion  . BREAST BIOPSY Right   . cataract    . CHOLECYSTECTOMY    . COLONOSCOPY  2006  . EYE SURGERY Bilateral 2011   cataract  . FRACTURE SURGERY  2018   left wrist  . NASAL SINUS SURGERY    . NASAL SINUS SURGERY    . OPEN REDUCTION INTERNAL FIXATION (ORIF) DISTAL RADIAL FRACTURE Left 06/05/2016   Procedure: OPEN REDUCTION INTERNAL FIXATION (ORIF) DISTAL RADIAL FRACTURE;  Surgeon: Earnestine Leys, MD;  Location: [facility] ORS;  Service: Orthopedics;  Laterality: Left;  Hand Innovations needed  . [place]  2003, 2012  . TYMPANOSTOMY TUBE PLACEMENT     Family History  Problem Relation Age of Onset  . Heart disease Mother   . Arthritis Mother   . Vision loss Mother   . Heart disease Father   . Stroke Father   . Vascular Disease Sister   . Heart disease Brother    Social History   Socioeconomic History  . Marital status: Single    Spouse name: Not on file  . Number of children: Not on file  . Years of education: Not on file   . Highest education level: Some college, no degree  Occupational History  . Not on file  Tobacco Use  . Smoking status: Current Every Day Smoker    Packs/day: 0.50    Years: 40.00    Pack years: 20.00    Types: Cigarettes  . Smokeless tobacco: Never Used  Vaping Use  . Vaping Use: Never used  Substance and Sexual Activity  . Alcohol use: Never  . Drug use: Never  . Sexual activity: Not Currently    Birth control/protection: None  Other Topics Concern  . Not on file  Social History Narrative  . Not on file   Social Determinants of Health   Financial Resource Strain: Medium Risk  . Difficulty of Paying Living Expenses: Somewhat hard  Food Insecurity: No Food Insecurity  . Worried  About Running Out of Food in the Last Year: Never true  . Ran Out of Food in the Last Year: Never true  Transportation Needs: No Transportation Needs  . Lack of Transportation (Medical): No  . Lack of Transportation (Non-Medical): No  Physical Activity: Inactive  . Days of Exercise per Week: 0 days  . Minutes of Exercise per Session: 0 min  Stress: No Stress Concern Present  . Feeling of Stress : Not at all  Social Connections: Moderately Integrated  . Frequency of Communication with Friends and Family: More than three times a week  . Frequency of Social Gatherings with Friends and Family: Once a week  . Attends Religious Services: More than 4 times per year  . Active Member of Clubs or Organizations: Yes  . Attends Archivist Meetings: 1 to 4 times per year  . 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 no  Interpreter Needed?: No  Information entered by :: NAllen LPN   Activities of Daily Living In your present state of health, do you have any difficulty performing the following activities: 06/01/2020  Hearing? N  Vision? Y  Comment macular degeneration  Difficulty concentrating or making decisions? N  Walking or climbing stairs? Y  Dressing or bathing? N  Doing errands, shopping? N  Preparing Food and eating ? N  Using the Toilet? N  In the past six months, have you accidently leaked urine? N  Do you have problems with loss of bowel control? N  Managing your Medications? N  Managing your Finances? N  Housekeeping or managing your Housekeeping? N  Some recent data might be hidden    Patient Care Team: Malfi, Lupita Raider, FNP as PCP - General (Family Medicine) Clydell Hakim, MD (Inactive) as Consulting Physician (Anesthesiology) Greg Cutter, LCSW as Social Worker (Licensed Clinical Social Worker) Hall Busing, Nobie Putnam, RN as Registered Nurse ([place]) Zyair Rhein, Virl Diamond, [facility] as Pharmacist  Indicate any recent Medical Services you may have received from other than [facility] providers in the past year (date may be approximate).     Assessment:   This is a routine wellness examination for Karen.  Hearing/Vision screen  Hearing Screening   '125Hz'$  $Remo'250Hz'CygPs$'500Hz'$'1000Hz'$'2000Hz'$'3000Hz'$'4000Hz'$'6000Hz'$'8000Hz'$   Right ear:           Left ear:           Vision Screening Comments: Regular eye exams, [facility]  Dietary issues and exercise activities discussed: Current Exercise Habits: The patient does not participate in regular exercise at present  Goals    .  "I need help with managing my chronic pain and depression."  (pt-stated)      Current Barriers:  . Chronic Mental Health needs related to pain and major depressive disorder . Financial constraints related to managing health care expenses . Limited social support .  ADL IADL limitations . Mental Health Concerns  . Limited access to caregiver . Suicidal Ideation/Homicidal Ideation: No  Clinical Social Work Goal(s):  Marland Kitchen Over the next 120 days, patient will work with SW  bi-monthly  by telephone or in person to reduce or manage symptoms related to chronic pain and depression . Over the next 120 days, patient will work with SW to address concerns related to ongoing stress, isolation, pain and depressive symptoms  Interventions: . Patient interviewed and appropriate assessments performed: brief mental health assessment . Patient reports ongoing chronic pain and has not been able to find any relief. Patient is interested in a possible rheumatologist referral. Patient reports that she met new PCP and received trigger point injections that have been helpful with managing her pain. Patient reports that her pain is currently manageable.  . Patient completed both COVID vaccinations. Patient reports that her family member has COVID right now and she is taking appropriate precautions to protect herself. Patient shares that her brother in law has COVID this time (02/26/20) . LCSW discussed coping skills for anxiety. SW used empathetic and active and reflective listening, validated patient's feelings/concerns, and provided emotional support. LCSW provided self-care education to help manage her multiple health conditions and improve her mood.  . Provided patient with information about available mental health support resources within the area . Discussed plans with patient for ongoing care management follow up and provided patient with direct contact information for care management team . Advised patient to contact CCM Social Worker for urgent mental health needs/case  management needs . Assisted patient/caregiver with obtaining information about health plan benefits . A voluntary and extensive discussion about advanced care planning including explanation and discussion of advanced was undertaken with the patient.  Explanation regarding healthcare proxy and living will was reviewed. . Patient admits that she could be more active. Healthy self-care education provided. Patient does not wish to return to [facility] until COVID has calmed down. . Provided education to patient/caregiver about Hospice and/or Palliative Care services . Brief CBT provided throughout entire session. Patient does not want psychiatric referral at this time.  . Patient reports that her blood pressure has been good lately and that she keeps track of her blood pressure log daily.  . Patient reports that she does not need a steroid injection as her pain is very manageable at this time.  . Patient reports that the weather affects her pain and she has been able to be more active with the nice weather and limited chronic pain.  . Patient reports concern regarding billing for CCM Services. LCSW provided education centered around [facility] management and our services. Patient prefers 90 day follow up calls from entire team due to limited finances. LCSW updated entire CCM team and PCP.  Marland Kitchen Patient shares that recently went to a new pain doctor and will see them again on 03/08/20 for injections. She shares that her pain doctor found burn marks on her back from using her heating pad at night.  . Patient was upset that she was taken off of ibuprofen due to kidney side effects but shares that her pain doctor prescribed her tramadol for pain relief.  . Patient was positive for color guard but wishes to address this concern after the new year.   Patient Self Care Activities:  . Ability for insight . Motivation for treatment . Strong family or social support  Patient Coping Strengths:  . Hopefulness . Self  Advocate . Able to Communicate Effectively  Patient Self Care Deficits:  . Lacks social connections  Please see past updates related to this goal by clicking on the "Past Updates" button in the selected goal      .  Increase water intake      Recommend drinking at least 6-7 glasses of water a day     .  Patient Stated      06/01/2020, wants to reduce pain so she can do more    .  PharmD - Medication Assistance      CARE PLAN ENTRY (see longitudinal plan of care for additional care plan information)   Current Barriers:  . Chronic Disease Management support, education, and care coordination needs related to HTN, HLD, fibromyalgia, chronic pain and Vitamin D deficiency  . Financial Barriers  Pharmacist Clinical Goal(s):  Marland Kitchen Over the next 30 days, patient will work with CM Pharmacist to address needs related to medication assistance and complete medication review  Interventions: . Inter-disciplinary care team collaboration (see longitudinal plan of care) . Collaborated with PCP on 8/16 ? Recommend addition of ezetimibe to pravastatin 20 mg daily for further LDL lowering as patient declines intensification of statin therapy. Provider agrees.  ? Rx for ezetimibe sent to pharmacy.  ? Provider discontinues Rx for atorvastatin. ? Discuss patient's pain control, ibuprofen use and renal impairment ? Provider states will consider pain specialist referral with patient. ? Confirms advises patient to start aspirin 81 mg daily . Follow up with patient today ? Counsel on addition of ezetimibe to pravastatin for further LDL lowering. ? Patient confirms will pick up ezetimibe Rx (as well as Vitamin D Rx) from pharmacy ? Again encourage patient to limit NSAID use, only using as needed ? Counsel on use of topical diclofenac gel as alternative to oral NSAID for pain relief. ? Ms. Buckles reports she has tried diclofenac gel in past, but with limited success due to how many areas of body affected by  fibromyalgia pain ? Encourage patient to follow up with PCP for further discussion/as needed for worsening pain ? Advise patient confirmed with PCP- provider advises patient to start aspirin 81 mg daily for ASCVD prevention. . Again encourage patient to follow up with Social Security regarding extra help/LIS application/status . Counsel patient on [employer] for medication assistance ? Note Hypercholesterolemia - Medicare [employer] currently open. Provide patient with phone number to apply for program  Patient Self Care Activities:  . Attends all scheduled provider appointments . Calls pharmacy for medication refills . Calls provider office for new concerns or questions   Please see past updates related to this goal by clicking on the "Past Updates" button in the selected goal      .  Quit smoking / using tobacco      Smoking cessation discussed     .  SW-Track and Manage My Symptoms-Depression      Timeframe:  Long-Range Goal Priority:  Medium Start Date: 04/15/20 , readjusted on 05/06/20                   Expected End Date: 06/13/20                      Follow Up Date - 90 days from 05/06/20   - avoid negative self-talk - develop a personal safety plan - develop a plan to deal with triggers like holidays, anniversaries - exercise at least 2 to 3 times per week - have a plan for how to  handle bad days - journal feelings and what helps to feel better or worse - spend time or talk with others at least 2 to 3 times per week - spend time or talk with others every day - watch for early signs of feeling worse - write in journal every day    Why is this important?    Keeping track of your progress will help your treatment team find the right mix of medicine and therapy for you.   Write in your journal every day.   Day-to-day changes in depression symptoms are normal. It may be more helpful to check your progress at the end of each week instead of every day.     Current  Barriers:  . Chronic Mental Health needs related to pain and major depressive disorder . Financial constraints related to managing health care expenses . Limited social support . ADL IADL limitations . Mental Health Concerns  . Limited access to caregiver . Suicidal Ideation/Homicidal Ideation: No  Clinical Social Work Goal(s):  Marland Kitchen Over the next 120 days, patient will work with SW bi-monthly by telephone or in person to reduce or manage symptoms related to chronic pain and depression . Over the next 120 days, patient will work with SW to address concerns related to ongoing stress, isolation, pain and depressive symptoms  Interventions: . Patient interviewed and appropriate assessments performed: brief mental health assessment . Patient reports ongoing chronic pain and has not been able to find any relief. Patient is interested in a possible rheumatologist referral. Patient was encouraged to discuss this with PCP during next office visit. Patient is currently seeing a pain management physician. Patient reports that she met new pain physician and received trigger point injections that have been helpful with managing her pain. Patient reports that her pain is currently manageable even with the cold weather but she had a bad day yesterday on 05/05/20. Patient reports that she will receive injections directly into her SS joints on 05/24/20.  . Patient completed all COVID vaccinations. Marland Kitchen LCSW discussed coping skills for anxiety. SW used empathetic and active and reflective listening, validated patient's feelings/concerns, and provided emotional support. LCSW provided self-care education to help manage her multiple health conditions and improve her mood. Patient was put on Lexapro on 04/19/20 by PCP to help manage her depression management.  . Provided patient with information about available mental health support resources within the area . Discussed plans with patient for ongoing care management follow up  and provided patient with direct contact information for care management team . Advised patient to contact CCM Social Worker for urgent mental health needs/case management needs . Assisted patient/caregiver with obtaining information about health plan benefits . A voluntary and extensive discussion about advanced care planning including explanation and discussion of advanced was undertaken with the patient.  Explanation regarding healthcare proxy and living will was reviewed. . Patient admits that she could be more active. Healthy self-care education provided. Patient does not wish to return to the [facility] until COVID has calmed down. . Provided education to patient/caregiver about Hospice and/or Palliative Care services . Brief CBT provided throughout entire session. Patient does not want psychiatric referral at this time.  . Active listening utilized, counseling provided, current coping strategies identified, decision-making supported, healthy lifestyle promoted, journaling promoted, meditative movement therapy encouraged, mindfulness encouraged, participation in counseling encouraged, problem-solving facilitated, relaxation techniques promoted, self-reflection promoted, spiritual activities promoted, verbalization of feelings encouraged, affirmation provided, community involvement promoted, expression of thoughts about present/future encouraged,  independence in all possible areas promote, life review by storytelling encouraged, patient strengths promoted, psychosocial concerns monitored, self-expression encouraged, sleep diary encouraged, sleep hygiene techniques encouraged, social relationships promoted, strategies to maintain hearing and/or vision promoted and wellness behaviors promoted . Patient reports that her blood pressure has been good lately and that she keeps track of her blood pressure log daily.  . Patient reports that she has been staying at home often to prevent getting COVID but went out  today to get her car serviced. Patient reports that it feels good to be out of the house today with the sun shining.  . Patient reports that the weather affects her pain and she has been able to be more active with the nice weather and limited chronic pain. Patient has been doing [employer].  . Patient reports concern regarding billing for CCM Services. LCSW provided education centered around [facility] management and our services. Patient prefers 90 day follow up calls from entire team due to limited finances. LCSW updated entire CCM team and PCP.  Marland Kitchen She shares that her pain doctor found burn marks on her back from using her heating pad at night to alleviate her pain. Pain management coping skill education provided.  . Patient has a sick friend in the hospital which is causing an increase in her stress and anxiety because she is worried about her well-being.  . Patient was upset that she was taken off of ibuprofen due to kidney side effects but shares that her pain doctor prescribed her tramadol for pain relief.  . Patient was positive for color guard is aware that she needs to address this concern but does not wish to discuss or address this until after COVID settles down.   Patient Self Care Activities:  . Ability for insight . Motivation for treatment . Strong family or social support  Patient Coping Strengths:  . Hopefulness . Self Advocate . Able to Communicate Effectively  Patient Self Care Deficits:  . Lacks social connections  Please see past updates related to this goal by clicking on the "Past Updates" button in the selected goal       Depression Screen PHQ 2/9 Scores 06/01/2020 05/19/2020 11/21/2019 07/25/2019 06/26/2019 05/27/2019 03/05/2019  PHQ - 2 Score 1 1 0 0 0 0 0  PHQ- 9 Score $Remov'7 4 3 4 'YLYjUX$ - - -  Exception Documentation - - - - - - -    Fall Risk Fall Risk  06/01/2020 11/21/2019 05/27/2019 03/05/2019 02/18/2019  Falls in the past year? 0 0 0 0 0  Number falls in  past yr: - 0 0 0 0  Injury with Fall? - 0 0 - -  Comment - - - - -  Risk Factor Category  - - - - -  Risk for fall due to : Medication side effect;Impaired balance/gait No Fall Risks - - -  Follow up Falls evaluation completed;Education provided;Falls prevention discussed Falls evaluation completed - Falls evaluation completed -    FALL RISK PREVENTION PERTAINING TO THE HOME:  Any stairs in or around the home? Yes  If so, are there any without handrails? No  Home free of loose throw rugs in walkways, pet beds, electrical cords, etc? Yes  Adequate lighting in your home to reduce risk of falls? Yes   ASSISTIVE DEVICES UTILIZED TO PREVENT FALLS:  Life alert? No  Use of a cane, walker or w/c? Yes  Grab bars in the bathroom? Yes  Shower chair or bench  in shower? No  Elevated toilet seat or a handicapped toilet? Yes   TIMED UP AND GO:  Was the test performed? No .     Cognitive Function:     6CIT Screen 06/01/2020 05/21/2018 02/13/2017  What Year? 0 points 0 points 0 points  What month? 0 points 0 points 0 points  What time? 0 points 0 points 0 points  Count back from 20 0 points 0 points 0 points  Months in reverse 0 points 0 points 0 points  Repeat phrase 0 points 0 points 0 points  Total Score 0 0 0    Immunizations Immunization History  Administered Date(s) Administered  . Influenza Split 02/16/2011, 02/06/2012  . Influenza Whole 04/10/2009  . Influenza,inj,Quad PF,6+ Mos 03/26/2017, 01/30/2018, 01/15/2019  . Influenza-Unspecified 03/03/2020  . PFIZER(Purple Top)SARS-COV-2 Vaccination 05/14/2019, 06/04/2019  . Pneumococcal Polysaccharide-23 04/10/2008    TDAP status: allergy  Flu Vaccine status: Up to date  Pneumococcal vaccine status: Up to date  Covid-19 vaccine status: Completed vaccines  Qualifies for Shingles Vaccine? Yes   Zostavax completed Yes   Shingrix Completed?: No.    Education has been provided regarding the importance of this vaccine. Patient  has been advised to call insurance company to determine out of pocket expense if they have not yet received this vaccine. Advised may also receive vaccine at local pharmacy or Health Dept. Verbalized acceptance and understanding.  Screening Tests Health Maintenance  Topic Date Due  . COVID-19 Vaccine (3 - Booster for Pfizer series) 12/02/2019  . MAMMOGRAM  03/17/2022  . Fecal DNA (Cologuard)  02/09/2023  . INFLUENZA VACCINE  Completed  . Hepatitis C Screening  Completed  . HIV Screening  Completed  . PAP SMEAR-Modifier  Discontinued  . TETANUS/TDAP  Discontinued    Health Maintenance  Health Maintenance Due  Topic Date Due  . COVID-19 Vaccine (3 - Booster for Pfizer series) 12/02/2019    Colorectal cancer screening: declines at this time due to cost  Mammogram status: Completed 03/17/2020. Repeat every year  Bone Density status: Completed 03/17/2020. Results reflect: Bone density results: OSTEOPOROSIS. Repeat every 2 years.  Lung Cancer Screening: (Low Dose CT Chest recommended if Age 65-80 years, 30 pack-year currently smoking OR have quit w/in 15years.) does not qualify.   Lung Cancer Screening Referral: no  Additional Screening:  Hepatitis C Screening: does qualify; Completed 02/09/1984  Vision Screening: Recommended annual ophthalmology exams for early detection of glaucoma and other disorders of the eye. Is the patient up to date with their annual eye exam?  Yes  Who is the provider or what is the name of the office in which the patient attends annual eye exams? [facility] If pt is not established with a provider, would they like to be referred to a provider to establish care? No .   Dental Screening: Recommended annual dental exams for proper oral hygiene  Community Resource Referral / Chronic Care Management: CRR required this visit?  No   CCM required this visit?  No      Plan:     I have personally reviewed and noted the following in the patient's  chart:   . Medical and social history . Use of alcohol, tobacco or illicit drugs  . Current medications and supplements . Functional ability and status . Nutritional status . Physical activity . Advanced directives . List of other physicians . Hospitalizations, surgeries, and ER visits in previous 12 months . Vitals . Screenings to include cognitive, depression, and  falls . Referrals and appointments  In addition, I have reviewed and discussed with patient certain preventive protocols, quality metrics, and best practice recommendations. A written personalized care plan for preventive services as well as general preventive health recommendations were provided to patient.     Kellie Simmering, LPN   7/46/0029   Nurse Notes:

## 2020-06-09 ENCOUNTER — Ambulatory Visit: Payer: PPO | Admitting: Licensed Clinical Social Worker

## 2020-06-09 DIAGNOSIS — M797 Fibromyalgia: Secondary | ICD-10-CM

## 2020-06-09 DIAGNOSIS — F3289 Other specified depressive episodes: Secondary | ICD-10-CM | POA: Diagnosis not present

## 2020-06-09 DIAGNOSIS — F330 Major depressive disorder, recurrent, mild: Secondary | ICD-10-CM | POA: Diagnosis not present

## 2020-06-09 DIAGNOSIS — E785 Hyperlipidemia, unspecified: Secondary | ICD-10-CM | POA: Diagnosis not present

## 2020-06-09 DIAGNOSIS — F419 Anxiety disorder, unspecified: Secondary | ICD-10-CM

## 2020-06-09 NOTE — Patient Instructions (Signed)
Licensed Clinical Social Worker Visit Information  Goals we discussed today:  Goals Addressed            This Visit's Progress   . SW-Track and Manage My Symptoms-Depression        Timeframe:  Long-Range Goal Priority:  Medium Start Date: 04/15/20 , readjusted on 05/06/20                   Expected End Date: 07/14/20                      Follow Up Date - 07/14/20   - avoid negative self-talk - develop a personal safety plan - develop a plan to deal with triggers like holidays, anniversaries - exercise at least 2 to 3 times per week - have a plan for how to handle bad days - journal feelings and what helps to feel better or worse - spend time or talk with others at least 2 to 3 times per week - spend time or talk with others every day - watch for early signs of feeling worse - write in journal every day    Why is this important?    Keeping track of your progress will help your treatment team find the right mix of medicine and therapy for you.   Write in your journal every day.   Day-to-day changes in depression symptoms are normal. It may be more helpful to check your progress at the end of each week instead of every day.     Current Barriers:  . Chronic Mental Health needs related to pain and major depressive disorder . Financial constraints related to managing health care expenses . Limited social support . ADL IADL limitations . Mental Health Concerns  . Limited access to caregiver . Suicidal Ideation/Homicidal Ideation: No  Clinical Social Work Goal(s):  Marland Kitchen Over the next 120 days, patient will work with SW bi-monthly by telephone or in person to reduce or manage symptoms related to chronic pain and depression . Over the next 120 days, patient will work with SW to address concerns related to ongoing stress, isolation, pain and depressive symptoms  Interventions: . Patient interviewed and appropriate assessments performed: brief mental health assessment . Patient reports  ongoing chronic pain and has not been able to find any relief. Patient is interested in a possible rheumatologist referral. Patient was encouraged to discuss this with PCP during next office visit. Patient is currently seeing a pain management physician. Patient reports that she met new pain physician and received trigger point injections that have been helpful with managing her pain. Patient reports that her pain is currently manageable even with the cold weather but she had a bad day yesterday on 05/05/20. Patient reports that she will receive injections directly into her SS joints on 05/24/20.  . Patient completed all COVID vaccinations. Marland Kitchen CCM LCSW was unable to reach patient on 06/09/20 but spoke with her sister Bethena Roys. Bethena Roys reports that patient is doing very well and spoke with her yesterday. Sister shares that patient struggles with her daily pain but still pushes herself to get out of the house and be active and social. Sister shares that she thinks patient's new medication for depression/anxiety management is effectively working for her. Sister reports that she is proud of patient's recent increase in self-care and progress. Sister reports that patient is stable and declines any urgent needs at this time. Family is agreeable to contact CFP or CCM LCSW directly if  any concerns arise.  Marland Kitchen LCSW discussed coping skills for anxiety. SW used empathetic and active and reflective listening, validated patient's feelings/concerns, and provided emotional support. LCSW provided self-care education to help manage her multiple health conditions and improve her mood. Patient was put on Lexapro on 04/19/20 by PCP to help manage her depression management.  . Provided patient with information about available mental health support resources within the area . Discussed plans with patient for ongoing care management follow up and provided patient with direct contact information for care management team . Advised patient to contact  CCM Social Worker for urgent mental health needs/case management needs . Assisted patient/caregiver with obtaining information about health plan benefits . A voluntary and extensive discussion about advanced care planning including explanation and discussion of advanced was undertaken with the patient.  Explanation regarding healthcare proxy and living will was reviewed. . Patient admits that she could be more active. Healthy self-care education provided. Patient does not wish to return to the [facility] until COVID has calmed down. . Provided education to patient/caregiver about Hospice and/or Palliative Care services . Brief CBT provided throughout entire session. Patient does not want psychiatric referral at this time.  . Active listening utilized, counseling provided, current coping strategies identified, decision-making supported, healthy lifestyle promoted, journaling promoted, meditative movement therapy encouraged, mindfulness encouraged, participation in counseling encouraged, problem-solving facilitated, relaxation techniques promoted, self-reflection promoted, spiritual activities promoted, verbalization of feelings encouraged, affirmation provided, community involvement promoted, expression of thoughts about present/future encouraged, independence in all possible areas promote, life review by storytelling encouraged, patient strengths promoted, psychosocial concerns monitored, self-expression encouraged, sleep diary encouraged, sleep hygiene techniques encouraged, social relationships promoted, strategies to maintain hearing and/or vision promoted and wellness behaviors promoted . Patient reports that her blood pressure has been good lately and that she keeps track of her blood pressure log daily.  . Patient reports that she has been staying at home often to prevent getting COVID but went out today to get her car serviced. Patient reports that it feels good to be out of the house today with the sun  shining.  . Patient reports that the weather affects her pain and she has been able to be more active with the nice weather and limited chronic pain. Patient has been doing [employer].  . Patient reports concern regarding billing for CCM Services. LCSW provided education centered around [facility] management and our services. Patient prefers 90 day follow up calls from entire team due to limited finances. LCSW updated entire CCM team and PCP.  Marland Kitchen She shares that her pain doctor found burn marks on her back from using her heating pad at night to alleviate her pain. Pain management coping skill education provided.  . Patient has a sick friend in the hospital which is causing an increase in her stress and anxiety because she is worried about her well-being.  . Patient was upset that she was taken off of ibuprofen due to kidney side effects but shares that her pain doctor prescribed her tramadol for pain relief.  . Patient was positive for color guard is aware that she needs to address this concern but does not wish to discuss or address this until after COVID settles down.   Patient Self Care Activities:  . Ability for insight . Motivation for treatment . Strong family or social support  Patient Coping Strengths:  . Hopefulness . Self Advocate . Able to Communicate Effectively  Patient Self Care Deficits:  . Lacks  social connections  Please see past updates related to this goal by clicking on the "Past Updates" button in the selected goal         Eula Fried, [place], MSW, [email or web address] Phone: [phone]

## 2020-06-09 NOTE — Chronic Care Management (AMB) (Signed)
Chronic Care Management    Clinical Social Work Note  06/09/2020 Name: April Price MRN: [id number] DOB: 28-Dec-1955  April Price is a 65 y.o. year old female who is a primary care patient of Lorine Bears, Lupita Raider, FNP. The CCM team was consulted to assist the patient with chronic disease management and/or care coordination needs related to: Mental Health Counseling and Resources.   Engaged with sister  for follow up visit in response to provider referral for social work chronic care management and care coordination services.   Consent to Services:  The patient was given the following information about Chronic Care Management services today, agreed to services, and gave verbal consent: 1. CCM service includes personalized support from designated clinical staff supervised by the primary care provider, including individualized plan of care and coordination with other care providers 2. 24/7 contact phone numbers for assistance for urgent and routine care needs. 3. Service will only be billed when office clinical staff spend 20 minutes or more in a month to coordinate care. 4. Only one practitioner may furnish and bill the service in a calendar month. 5.The patient may stop CCM services at any time (effective at the end of the month) by phone call to the office staff. 6. The patient will be responsible for cost sharing (co-pay) of up to 20% of the service fee (after annual deductible is met). Patient agreed to services and consent obtained.  Patient agreed to services and consent obtained.   Assessment: Review of patient past medical history, allergies, medications, and health status, including review of relevant consultants reports was performed today as part of a comprehensive evaluation and provision of chronic care management and care coordination services.     SDOH (Social Determinants of Health) assessments and interventions performed:    Advanced Directives Status: See Care Plan for  related entries.  CCM Care Plan  Allergies  Allergen Reactions  . Penicillins Anaphylaxis    Has patient had a PCN reaction causing immediate rash, facial/tongue/throat swelling, SOB or lightheadedness with hypotension: Yes Has patient had a PCN reaction causing severe rash involving mucus membranes or skin necrosis: No Has patient had a PCN reaction that required hospitalization No Has patient had a PCN reaction occurring within the last 10 years: Yes If all of the above answers are "NO", then may proceed with Cephalosporin use.   . Tylenol [Acetaminophen] Anaphylaxis, Itching and Hives    Ears and throat itch   . Morphine And Related Other (See Comments)    Depression   . Nickel Itching  . Pepcid [Famotidine] Nausea And Vomiting  . Shrimp [Shellfish Allergy] Hives  . Tape     irritation  . Tetanus Toxoids Other (See Comments)    Swelling at injection site and fever  . Wheat Bran   . Corn-Containing Products     Has hay fever and swelling in fingers.  . Gabapentin Other (See Comments)    Makes her feel "spacy" off balance, like she is coming out of anesthesia.     Outpatient Encounter Medications as of 06/09/2020  Medication Sig  . albuterol (PROVENTIL HFA;VENTOLIN HFA) 108 (90 Base) MCG/ACT inhaler Inhale 2 puffs into the lungs every 4 (four) hours as needed for wheezing or shortness of breath.  Marland Kitchen alendronate (FOSAMAX) 70 MG tablet Take 1 tablet (70 mg total) by mouth every 7 (seven) days. Take with a full glass of water on an empty stomach.  Marland Kitchen azelastine (ASTELIN) 0.1 % nasal spray Place 2 sprays  into both nostrils 2 (two) times daily as needed for rhinitis. Use in each nostril as directed  . busPIRone (BUSPAR) 10 MG tablet Take 1 tablet (10 mg total) by mouth 3 (three) times daily. (Patient not taking: Reported on 06/01/2020)  . Cholecalciferol (VITAMIN D3) 50 MCG (2000 UT) TABS Take 1 tablet by mouth daily.  Marland Kitchen EPINEPHrine (EPIPEN 2-PAK) 0.3 mg/0.3 mL IJ SOAJ injection Inject  0.3 mLs (0.3 mg total) into the muscle once for 1 dose.  . escitalopram (LEXAPRO) 5 MG tablet Take 1 tablet (5 mg total) by mouth daily.  Marland Kitchen esomeprazole (NEXIUM) 40 MG capsule TAKE 1 CAPSULE EVERY DAY  . ezetimibe (ZETIA) 10 MG tablet Take 1 tablet (10 mg total) by mouth daily.  . fluticasone (FLONASE) 50 MCG/ACT nasal spray Place 2 sprays into both nostrils daily.  . meclizine (ANTIVERT) 25 MG tablet Take 1 tablet (25 mg total) by mouth 3 (three) times daily as needed for dizziness.  . montelukast (SINGULAIR) 10 MG tablet Take 1 tablet (10 mg total) by mouth at bedtime.  . Multiple Vitamins-Minerals (PRESERVISION AREDS 2 PO) Take by mouth.  . pravastatin (PRAVACHOL) 20 MG tablet Take 1 tablet (20 mg total) by mouth daily.  Marland Kitchen tiZANidine (ZANAFLEX) 4 MG tablet TAKE ONE TABLET FOUR TIMES DAILY AS NEEDED FOR MUSCLE SPASM  . traMADol (ULTRAM) 50 MG tablet Take 50 mg by mouth every 8 (eight) hours as needed.  . Vitamin D, Ergocalciferol, (DRISDOL) 1.25 MG (50000 UNIT) CAPS capsule Take 1 capsule (50,000 Units total) by mouth every 7 (seven) days. (Patient not taking: Reported on 06/01/2020)   No facility-administered encounter medications on file as of 06/09/2020.    Patient Active Problem List   Diagnosis Date Noted  . Positive colorectal cancer screening using Cologuard test 04/19/2020  . Osteopenia 11/21/2019  . Encounter for screening mammogram for malignant neoplasm of breast 11/21/2019  . Hyperlipidemia 11/21/2019  . GERD (gastroesophageal reflux disease) 11/21/2019  . Joint pain 08/22/2019  . 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 Chronic lumbar radicular pain (Left) 04/18/2016  . Vitamin D deficiency 03/07/2016  . Musculoskeletal pain 02/11/2016  . Neurogenic pain 02/11/2016  . Chronic pain syndrome 02/10/2016  . Long term current use of opiate analgesic 02/10/2016  . Long term prescription opiate use 02/10/2016  . Opiate use (80-140 MME/Day) 02/10/2016  . Chronic shoulder pain  (Location of Primary Source of Pain) (Bilateral) (L>R) 02/10/2016  . Chronic hip pain (Location of Secondary source of pain) (Bilateral) (L>R) 02/10/2016  . Chronic sacroiliac joint pain (Location of Tertiary source of pain) (Bilateral) (L>R) 02/10/2016  . Chronic low back pain (Bilateral) (L>R) 02/10/2016  . 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 History of lumbar fusion 02/10/2016  . Chronic lower extremity pain (Bilateral) (L>R) 02/10/2016  . Chronic knee pain (Bilateral) (L>R) 02/10/2016  . Chronic elbow pain (Bilateral) (L>R) 02/10/2016  . Chronic hand pain (Bilateral) (L>R) 02/10/2016  . History of fibrocystic disease of breast 01/08/2013  . Dry eyes, bilateral 02/06/2012  . Hypoglycemia 02/06/2012  . Hypertension 02/06/2012  . Chronic fatigue syndrome 02/06/2012  . Osteoporosis 02/06/2012  . Vaginal atrophy 02/06/2012  . Spasm of back muscles 01/31/2011  . Chronic neck pain 01/31/2011  . Depression 01/13/2011  . History of seasonal allergies 01/13/2011  . Situational disturbance 01/13/2011  . Fibromyalgia 01/12/2011    Conditions to be addressed/monitored: Anxiety and Depression; Mental Health Concerns   Care Plan : General Social Work (Adult)  Updates made by Eula Fried  L, LCSW since 06/09/2020 12:00 AM    Problem: Quality of Life (General Plan of Care)     Long-Range Goal: Quality of Life Maintained   Start Date: 04/15/2020  Priority: Medium  Note:    Timeframe:  Long-Range Goal Priority:  Medium Start Date: 04/15/20 , readjusted on 05/06/20                   Expected End Date: 07/14/20                      Follow Up Date - 07/14/20   - avoid negative self-talk - develop a personal safety plan - develop a plan to deal with triggers like holidays, anniversaries - exercise at least 2 to 3 times per week - have a plan for how to handle bad days - journal feelings and what helps to feel better or worse - spend time or talk with others at least 2 to 3 times per  week - spend time or talk with others every day - watch for early signs of feeling worse - write in journal every day    Why is this important?    Keeping track of your progress will help your treatment team find the right mix of medicine and therapy for you.   Write in your journal every day.   Day-to-day changes in depression symptoms are normal. It may be more helpful to check your progress at the end of each week instead of every day.     Current Barriers:  . Chronic Mental Health needs related to pain and major depressive disorder . Financial constraints related to managing health care expenses . Limited social support . ADL IADL limitations . Mental Health Concerns  . Limited access to caregiver . Suicidal Ideation/Homicidal Ideation: No  Clinical Social Work Goal(s):  Marland Kitchen Over the next 120 days, patient will work with SW bi-monthly by telephone or in person to reduce or manage symptoms related to chronic pain and depression . Over the next 120 days, patient will work with SW to address concerns related to ongoing stress, isolation, pain and depressive symptoms  Interventions: . Patient interviewed and appropriate assessments performed: brief mental health assessment . Patient reports ongoing chronic pain and has not been able to find any relief. Patient is interested in a possible rheumatologist referral. Patient was encouraged to discuss this with PCP during next office visit. Patient is currently seeing a pain management physician. Patient reports that she met new pain physician and received trigger point injections that have been helpful with managing her pain. Patient reports that her pain is currently manageable even with the cold weather but she had a bad day yesterday on 05/05/20. Patient reports that she will receive injections directly into her SS joints on 05/24/20.  . Patient completed all COVID vaccinations. Marland Kitchen CCM LCSW was unable to reach patient on 06/09/20 but spoke with  her sister April Price. April Price reports that patient is doing very well and spoke with her yesterday. Sister shares that patient struggles with her daily pain but still pushes herself to get out of the house and be active and social. Sister shares that she thinks patient's new medication for depression/anxiety management is effectively working for her. Sister reports that she is proud of patient's recent increase in self-care and progress. Sister reports that patient is stable and declines any urgent needs at this time. Family is agreeable to contact CFP or CCM LCSW directly if any concerns  arise.  Marland Kitchen LCSW discussed coping skills for anxiety. SW used empathetic and active and reflective listening, validated patient's feelings/concerns, and provided emotional support. LCSW provided self-care education to help manage her multiple health conditions and improve her mood. Patient was put on Lexapro on 04/19/20 by PCP to help manage her depression management.  . Provided patient with information about available mental health support resources within the area . Discussed plans with patient for ongoing care management follow up and provided patient with direct contact information for care management team . Advised patient to contact CCM Social Worker for urgent mental health needs/case management needs . Assisted patient/caregiver with obtaining information about health plan benefits . A voluntary and extensive discussion about advanced care planning including explanation and discussion of advanced was undertaken with the patient.  Explanation regarding healthcare proxy and living will was reviewed. . Patient admits that she could be more active. Healthy self-care education provided. Patient does not wish to return to the [facility] until COVID has calmed down. . Provided education to patient/caregiver about Hospice and/or Palliative Care services . Brief CBT provided throughout entire session. Patient does not want psychiatric  referral at this time.  . Active listening utilized, counseling provided, current coping strategies identified, decision-making supported, healthy lifestyle promoted, journaling promoted, meditative movement therapy encouraged, mindfulness encouraged, participation in counseling encouraged, problem-solving facilitated, relaxation techniques promoted, self-reflection promoted, spiritual activities promoted, verbalization of feelings encouraged, affirmation provided, community involvement promoted, expression of thoughts about present/future encouraged, independence in all possible areas promote, life review by storytelling encouraged, patient strengths promoted, psychosocial concerns monitored, self-expression encouraged, sleep diary encouraged, sleep hygiene techniques encouraged, social relationships promoted, strategies to maintain hearing and/or vision promoted and wellness behaviors promoted . Patient reports that her blood pressure has been good lately and that she keeps track of her blood pressure log daily.  . Patient reports that she has been staying at home often to prevent getting COVID but went out today to get her car serviced. Patient reports that it feels good to be out of the house today with the sun shining.  . Patient reports that the weather affects her pain and she has been able to be more active with the nice weather and limited chronic pain. Patient has been doing [employer].  . Patient reports concern regarding billing for CCM Services. LCSW provided education centered around [facility] management and our services. Patient prefers 90 day follow up calls from entire team due to limited finances. LCSW updated entire CCM team and PCP.  Marland Kitchen She shares that her pain doctor found burn marks on her back from using her heating pad at night to alleviate her pain. Pain management coping skill education provided.  . Patient has a sick friend in the hospital which is causing an increase  in her stress and anxiety because she is worried about her well-being.  . Patient was upset that she was taken off of ibuprofen due to kidney side effects but shares that her pain doctor prescribed her tramadol for pain relief.  . Patient was positive for color guard is aware that she needs to address this concern but does not wish to discuss or address this until after COVID settles down.   Patient Self Care Activities:  . Ability for insight . Motivation for treatment . Strong family or social support  Patient Coping Strengths:  . Hopefulness . Self Advocate . Able to Communicate Effectively  Patient Self Care Deficits:  . Lacks social connections  Please see past updates related to this goal by clicking on the "Past Updates" button in the selected goal        Follow Up Plan: SW will follow up with patient by phone over the next quarter      Eula Fried, [employer], MSW, [email or web address] Phone: [phone]

## 2020-06-21 DIAGNOSIS — M461 Sacroiliitis, not elsewhere classified: Secondary | ICD-10-CM | POA: Diagnosis not present

## 2020-06-21 DIAGNOSIS — M797 Fibromyalgia: Secondary | ICD-10-CM | POA: Diagnosis not present

## 2020-06-21 DIAGNOSIS — G894 Chronic pain syndrome: Secondary | ICD-10-CM | POA: Diagnosis not present

## 2020-06-21 DIAGNOSIS — M5136 Other intervertebral disc degeneration, lumbar region: Secondary | ICD-10-CM | POA: Diagnosis not present

## 2020-06-28 ENCOUNTER — Telehealth: Payer: Self-pay

## 2020-07-14 ENCOUNTER — Ambulatory Visit: Payer: PPO | Admitting: Licensed Clinical Social Worker

## 2020-07-14 DIAGNOSIS — F419 Anxiety disorder, unspecified: Secondary | ICD-10-CM

## 2020-07-14 DIAGNOSIS — F330 Major depressive disorder, recurrent, mild: Secondary | ICD-10-CM

## 2020-07-14 DIAGNOSIS — G894 Chronic pain syndrome: Secondary | ICD-10-CM

## 2020-07-14 DIAGNOSIS — I10 Essential (primary) hypertension: Secondary | ICD-10-CM

## 2020-07-14 DIAGNOSIS — M797 Fibromyalgia: Secondary | ICD-10-CM

## 2020-07-14 DIAGNOSIS — E785 Hyperlipidemia, unspecified: Secondary | ICD-10-CM

## 2020-07-14 NOTE — Chronic Care Management (AMB) (Signed)
Chronic Care Management    Clinical Social Work Note  07/14/2020 Name: April Price MRN: [id number] DOB: [birth year]  April Price is a 65 y.o. year old female who is a primary care patient of Lorine Bears, Lupita Raider, FNP. The CCM team was consulted to assist the patient with chronic disease management and/or care coordination needs related to: [employer]  and Level of Care Concerns.   Engaged with patient by telephone for follow up visit in response to provider referral for social work chronic care management and care coordination services.   Consent to Services:  The patient was given the following information about Chronic Care Management services today, agreed to services, and gave verbal consent: 1. CCM service includes personalized support from designated clinical staff supervised by the primary care provider, including individualized plan of care and coordination with other care providers 2. 24/7 contact phone numbers for assistance for urgent and routine care needs. 3. Service will only be billed when office clinical staff spend 20 minutes or more in a month to coordinate care. 4. Only one practitioner may furnish and bill the service in a calendar month. 5.The patient may stop CCM services at any time (effective at the end of the month) by phone call to the office staff. 6. The patient will be responsible for cost sharing (co-pay) of up to 20% of the service fee (after annual deductible is met). Patient agreed to services and consent obtained.  Patient agreed to services and consent obtained.   Assessment: Review of patient past medical history, allergies, medications, and health status, including review of relevant consultants reports was performed today as part of a comprehensive evaluation and provision of chronic care management and care coordination services.     SDOH (Social Determinants of Health) assessments and interventions performed:    Advanced Directives  Status: See Care Plan for related entries.  CCM Care Plan  Allergies  Allergen Reactions  . Penicillins Anaphylaxis    Has patient had a PCN reaction causing immediate rash, facial/tongue/throat swelling, SOB or lightheadedness with hypotension: Yes Has patient had a PCN reaction causing severe rash involving mucus membranes or skin necrosis: No Has patient had a PCN reaction that required hospitalization No Has patient had a PCN reaction occurring within the last 10 years: Yes If all of the above answers are "NO", then may proceed with Cephalosporin use.   . Tylenol [Acetaminophen] Anaphylaxis, Itching and Hives    Ears and throat itch   . Morphine And Related Other (See Comments)    Depression   . Nickel Itching  . Pepcid [Famotidine] Nausea And Vomiting  . Shrimp [Shellfish Allergy] Hives  . Tape     irritation  . Tetanus Toxoids Other (See Comments)    Swelling at injection site and fever  . Wheat Bran   . Corn-Containing Products     Has hay fever and swelling in fingers.  . Gabapentin Other (See Comments)    Makes her feel "spacy" off balance, like she is coming out of anesthesia.     Outpatient Encounter Medications as of 07/14/2020  Medication Sig  . albuterol (PROVENTIL HFA;VENTOLIN HFA) 108 (90 Base) MCG/ACT inhaler Inhale 2 puffs into the lungs every 4 (four) hours as needed for wheezing or shortness of breath.  Marland Kitchen alendronate (FOSAMAX) 70 MG tablet Take 1 tablet (70 mg total) by mouth every 7 (seven) days. Take with a full glass of water on an empty stomach.  Marland Kitchen azelastine (ASTELIN) 0.1 % nasal  spray Place 2 sprays into both nostrils 2 (two) times daily as needed for rhinitis. Use in each nostril as directed  . busPIRone (BUSPAR) 10 MG tablet Take 1 tablet (10 mg total) by mouth 3 (three) times daily. (Patient not taking: Reported on 06/01/2020)  . Cholecalciferol (VITAMIN D3) 50 MCG (2000 UT) TABS Take 1 tablet by mouth daily.  Marland Kitchen EPINEPHrine (EPIPEN 2-PAK) 0.3 mg/0.3 mL  IJ SOAJ injection Inject 0.3 mLs (0.3 mg total) into the muscle once for 1 dose.  . escitalopram (LEXAPRO) 5 MG tablet Take 1 tablet (5 mg total) by mouth daily.  Marland Kitchen esomeprazole (NEXIUM) 40 MG capsule TAKE 1 CAPSULE EVERY DAY  . ezetimibe (ZETIA) 10 MG tablet Take 1 tablet (10 mg total) by mouth daily.  . fluticasone (FLONASE) 50 MCG/ACT nasal spray Place 2 sprays into both nostrils daily.  . meclizine (ANTIVERT) 25 MG tablet Take 1 tablet (25 mg total) by mouth 3 (three) times daily as needed for dizziness.  . montelukast (SINGULAIR) 10 MG tablet Take 1 tablet (10 mg total) by mouth at bedtime.  . Multiple Vitamins-Minerals (PRESERVISION AREDS 2 PO) Take by mouth.  . pravastatin (PRAVACHOL) 20 MG tablet Take 1 tablet (20 mg total) by mouth daily.  Marland Kitchen tiZANidine (ZANAFLEX) 4 MG tablet TAKE ONE TABLET FOUR TIMES DAILY AS NEEDED FOR MUSCLE SPASM  . traMADol (ULTRAM) 50 MG tablet Take 50 mg by mouth every 8 (eight) hours as needed.  . Vitamin D, Ergocalciferol, (DRISDOL) 1.25 MG (50000 UNIT) CAPS capsule Take 1 capsule (50,000 Units total) by mouth every 7 (seven) days. (Patient not taking: Reported on 06/01/2020)   No facility-administered encounter medications on file as of 07/14/2020.    Patient Active Problem List   Diagnosis Date Noted  . Positive colorectal cancer screening using Cologuard test 04/19/2020  . Osteopenia 11/21/2019  . Encounter for screening mammogram for malignant neoplasm of breast 11/21/2019  . Hyperlipidemia 11/21/2019  . GERD (gastroesophageal reflux disease) 11/21/2019  . Joint pain 08/22/2019  . 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 Chronic lumbar radicular pain (Left) 04/18/2016  . Vitamin D deficiency 03/07/2016  . Musculoskeletal pain 02/11/2016  . Neurogenic pain 02/11/2016  . Chronic pain syndrome 02/10/2016  . Long term current use of opiate analgesic 02/10/2016  . Long term prescription opiate use 02/10/2016  . Opiate use (80-140 MME/Day) 02/10/2016  .  Chronic shoulder pain (Location of Primary Source of Pain) (Bilateral) (L>R) 02/10/2016  . Chronic hip pain (Location of Secondary source of pain) (Bilateral) (L>R) 02/10/2016  . Chronic sacroiliac joint pain (Location of Tertiary source of pain) (Bilateral) (L>R) 02/10/2016  . Chronic low back pain (Bilateral) (L>R) 02/10/2016  . 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 History of lumbar fusion 02/10/2016  . Chronic lower extremity pain (Bilateral) (L>R) 02/10/2016  . Chronic knee pain (Bilateral) (L>R) 02/10/2016  . Chronic elbow pain (Bilateral) (L>R) 02/10/2016  . Chronic hand pain (Bilateral) (L>R) 02/10/2016  . History of fibrocystic disease of breast 01/08/2013  . Dry eyes, bilateral 02/06/2012  . Hypoglycemia 02/06/2012  . Hypertension 02/06/2012  . Chronic fatigue syndrome 02/06/2012  . Osteoporosis 02/06/2012  . Vaginal atrophy 02/06/2012  . Spasm of back muscles 01/31/2011  . Chronic neck pain 01/31/2011  . Depression 01/13/2011  . History of seasonal allergies 01/13/2011  . Situational disturbance 01/13/2011  . Fibromyalgia 01/12/2011    Conditions to be addressed/monitored: Depression; Limited social support, Level of care concerns, Mental Health Concerns , Social Isolation and Limited access  to caregiver  Care Plan : General Social Work (Adult)  Updates made by April Cutter, LCSW since 07/14/2020 12:00 AM    Problem: Quality of Life (General Plan of Care)     Long-Range Goal: Quality of Life Maintained   Start Date: 07/14/2020  Priority: Medium  Note:    Timeframe:  Long-Range Goal Priority:  Medium Start Date: 07/14/20            Expected End Date: 10/13/20                     Follow Up Date - 08/24/20   - avoid negative self-talk - develop a personal safety plan - develop a plan to deal with triggers like holidays, anniversaries - exercise at least 2 to 3 times per week - have a plan for how to handle bad days - journal feelings and what helps to feel  better or worse - spend time or talk with others at least 2 to 3 times per week - spend time or talk with others every day - watch for early signs of feeling worse - write in journal every day    Why is this important?    Keeping track of your progress will help your treatment team find the right mix of medicine and therapy for you.   Write in your journal every day.   Day-to-day changes in depression symptoms are normal. It may be more helpful to check your progress at the end of each week instead of every day.     Current Barriers:  . Chronic Mental Health needs related to pain and major depressive disorder . Financial constraints related to managing health care expenses . Limited social support . ADL IADL limitations . Mental Health Concerns  . Limited access to caregiver . Suicidal Ideation/Homicidal Ideation: No  Clinical Social Work Goal(s):  Marland Kitchen Over the next 120 days, patient will work with SW bi-monthly by telephone or in person to reduce or manage symptoms related to chronic pain and depression . Over the next 120 days, patient will work with SW to address concerns related to ongoing stress, isolation, pain and depressive symptoms  Interventions: . Patient interviewed and appropriate assessments performed: brief mental health assessment . Patient was informed that current CCM LCSW will be leaving position next month and her next CCM Social Work follow up visit will be with another LCSW. Patient was appreciative of support provided and receptive to news . Patient reports ongoing chronic pain but that she is having a good day with her pain management today on 07/14/20.  Patient is interested in a possible rheumatologist referral. Patient was encouraged to discuss this with PCP during next office visit. Patient is currently seeing a pain management physician. Patient reports that she met new pain physician and received trigger point injections that have been helpful with managing her  pain. Patient reports that her pain is currently manageable even with the cold weather but she had a bad day yesterday on 05/05/20. Patient reports that she received injections directly into her SS joints which helped to relieve some pain. However, patient recently was in the bed for 2 weeks because her pain was so bad but reports that it is better now. Patient reports that she will be able to get out and go to [employer] today because her pain is manageable.  . Patient completed all COVID vaccinations. She reports that she will get her second booster shot on 07/19/20.  Marland Kitchen  CCM LCSW was unable to reach patient on 06/09/20 but spoke with her sister April Price. April Price reports that patient is doing very well and spoke with her yesterday. Sister shares that patient struggles with her daily pain but still pushes herself to get out of the house and be active and social. Sister shares that she thinks patient's new medication for depression/anxiety management is effectively working for her. Sister reports that she is proud of patient's recent increase in self-care and progress. Sister reports that patient is stable and declines any urgent needs at this time. Family is agreeable to contact CFP or CCM LCSW directly if any concerns arise. Update- Patient's sister will be coming to visit patient in a few weeks which has lifted patient's spirits and mood.  Marland Kitchen LCSW discussed coping skills for anxiety. SW used empathetic and active and reflective listening, validated patient's feelings/concerns, and provided emotional support. LCSW provided self-care education to help manage her multiple health conditions and improve her mood. Patient was put on Lexapro on 04/19/20 by PCP to help manage her depression management.  . Provided patient with information about available mental health support resources within the area . Discussed plans with patient for ongoing care management follow up and provided patient with direct contact information for care  management team . Advised patient to contact CCM Social Worker for urgent mental health needs/case management needs . Assisted patient/caregiver with obtaining information about health plan benefits . A voluntary and extensive discussion about advanced care planning including explanation and discussion of advanced was undertaken with the patient.  Explanation regarding healthcare proxy and living will was reviewed. . Patient admits that she could be more active. Healthy self-care education provided. Patient does not wish to return to the [facility] until COVID has calmed down. . Provided education to patient/caregiver about Hospice and/or Palliative Care services . Brief CBT provided throughout entire session. Patient does not want psychiatric referral at this time.  . Active listening utilized, counseling provided, current coping strategies identified, decision-making supported, healthy lifestyle promoted, journaling promoted, meditative movement therapy encouraged, mindfulness encouraged, social relationships promoted and strategies to maintain health and wellness promoted.  . Patient reports that her blood pressure has been good lately and that she keeps track of her blood pressure log daily.  . Patient reports that she has been staying at home often to prevent getting COVID but went out today to get her car serviced. Patient reports that it feels good to be out of the house today with the sun shining.  . Patient reports that the weather affects her pain and she has been able to be more active with the nice weather and limited chronic pain. Patient has been doing [employer].  . Patient reports concern regarding billing for CCM Services. LCSW provided education centered around [facility] management and our services. Patient prefers 90 day follow up calls from entire team due to limited finances. LCSW updated entire CCM team and PCP.  Marland Kitchen She shares that her pain doctor found burn marks on her back  from using her heating pad at night to alleviate her pain. Pain management coping skill education provided.  . Patient has a sick friend in the hospital which is causing an increase in her stress and anxiety because she is worried about her well-being.  . Patient was upset that she was taken off of ibuprofen due to kidney side effects but shares that her pain doctor prescribed her tramadol for pain relief.  . Patient was positive  for color guard is aware that she needs to address this concern but does not wish to discuss or address this until after COVID settles down.   Patient Self Care Activities:  . Ability for insight . Motivation for treatment . Strong family or social support  Patient Coping Strengths:  . Hopefulness . Self Advocate . Able to Communicate Effectively  Patient Self Care Deficits:  . Lacks social connections  Please see past updates related to this goal by clicking on the "Past Updates" button in the selected goal        Follow Up Plan: SW will follow up with patient by phone over the next 45 days      Eula Fried, [employer], MSW, [email or web address] Phone: [phone]

## 2020-07-14 NOTE — Patient Instructions (Signed)
Licensed Clinical Social Worker Visit Information  Goals we discussed today:  Goals Addressed            This Visit's Progress   . SW-Track and Manage My Symptoms-Depression        Timeframe:  Long-Range Goal Priority:  Medium Start Date: 07/14/20            Expected End Date: 10/13/20                     Follow Up Date - 08/24/20   - avoid negative self-talk - develop a personal safety plan - develop a plan to deal with triggers like holidays, anniversaries - exercise at least 2 to 3 times per week - have a plan for how to handle bad days - journal feelings and what helps to feel better or worse - spend time or talk with others at least 2 to 3 times per week - spend time or talk with others every day - watch for early signs of feeling worse - write in journal every day    Why is this important?    Keeping track of your progress will help your treatment team find the right mix of medicine and therapy for you.   Write in your journal every day.   Day-to-day changes in depression symptoms are normal. It may be more helpful to check your progress at the end of each week instead of every day.     Current Barriers:  . Chronic Mental Health needs related to pain and major depressive disorder . Financial constraints related to managing health care expenses . Limited social support . ADL IADL limitations . Mental Health Concerns  . Limited access to caregiver . Suicidal Ideation/Homicidal Ideation: No  Clinical Social Work Goal(s):  Marland Kitchen Over the next 120 days, patient will work with SW bi-monthly by telephone or in person to reduce or manage symptoms related to chronic pain and depression . Over the next 120 days, patient will work with SW to address concerns related to ongoing stress, isolation, pain and depressive symptoms  Interventions: . Patient interviewed and appropriate assessments performed: brief mental health assessment . Patient was informed that current CCM LCSW will  be leaving position next month and her next CCM Social Work follow up visit will be with another LCSW. Patient was appreciative of support provided and receptive to news . Patient reports ongoing chronic pain but that she is having a good day with her pain management today on 07/14/20.  Patient is interested in a possible rheumatologist referral. Patient was encouraged to discuss this with PCP during next office visit. Patient is currently seeing a pain management physician. Patient reports that she met new pain physician and received trigger point injections that have been helpful with managing her pain. Patient reports that her pain is currently manageable even with the cold weather but she had a bad day yesterday on 05/05/20. Patient reports that she received injections directly into her SS joints which helped to relieve some pain. However, patient recently was in the bed for 2 weeks because her pain was so bad but reports that it is better now. Patient reports that she will be able to get out and go to [employer] today because her pain is manageable.  . Patient completed all COVID vaccinations. She reports that she will get her second booster shot on 07/19/20.  Marland Kitchen CCM LCSW was unable to reach patient on 06/09/20 but spoke with her sister  Roanna Raider reports that patient is doing very well and spoke with her yesterday. Sister shares that patient struggles with her daily pain but still pushes herself to get out of the house and be active and social. Sister shares that she thinks patient's new medication for depression/anxiety management is effectively working for her. Sister reports that she is proud of patient's recent increase in self-care and progress. Sister reports that patient is stable and declines any urgent needs at this time. Family is agreeable to contact CFP or CCM LCSW directly if any concerns arise. Update- Patient's sister will be coming to visit patient in a few weeks which has lifted patient's spirits  and mood.  Marland Kitchen LCSW discussed coping skills for anxiety. SW used empathetic and active and reflective listening, validated patient's feelings/concerns, and provided emotional support. LCSW provided self-care education to help manage her multiple health conditions and improve her mood. Patient was put on Lexapro on 04/19/20 by PCP to help manage her depression management.  . Provided patient with information about available mental health support resources within the area . Discussed plans with patient for ongoing care management follow up and provided patient with direct contact information for care management team . Advised patient to contact CCM Social Worker for urgent mental health needs/case management needs . Assisted patient/caregiver with obtaining information about health plan benefits . A voluntary and extensive discussion about advanced care planning including explanation and discussion of advanced was undertaken with the patient.  Explanation regarding healthcare proxy and living will was reviewed. . Patient admits that she could be more active. Healthy self-care education provided. Patient does not wish to [facility] until COVID has calmed down. . Provided education to patient/caregiver about Hospice and/or Palliative Care services . Brief CBT provided throughout entire session. Patient does not want psychiatric referral at this time.  . Active listening utilized, counseling provided, current coping strategies identified, decision-making supported, healthy lifestyle promoted, journaling promoted, meditative movement therapy encouraged, mindfulness encouraged, social relationships promoted and strategies to maintain health and wellness promoted.  . Patient reports that her blood pressure has been good lately and that she keeps track of her blood pressure log daily.  . Patient reports that she has been staying at home often to prevent getting COVID but went out today to get her car  serviced. Patient reports that it feels good to be out of the house today with the sun shining.  . Patient reports that the weather affects her pain and she has been able to be more active with the nice weather and limited chronic pain. Patient has been doing [employer].  . Patient reports concern regarding billing for CCM Services. LCSW provided education centered around [facility] management and our services. Patient prefers 90 day follow up calls from entire team due to limited finances. LCSW updated entire CCM team and PCP.  Marland Kitchen She shares that her pain doctor found burn marks on her back from using her heating pad at night to alleviate her pain. Pain management coping skill education provided.  . Patient has a sick friend in the hospital which is causing an increase in her stress and anxiety because she is worried about her well-being.  . Patient was upset that she was taken off of ibuprofen due to kidney side effects but shares that her pain doctor prescribed her tramadol for pain relief.  . Patient was positive for color guard is aware that she needs to address this concern but does  not wish to discuss or address this until after COVID settles down.   Patient Self Care Activities:  . Ability for insight . Motivation for treatment . Strong family or social support  Patient Coping Strengths:  . Hopefulness . Self Advocate . Able to Communicate Effectively  Patient Self Care Deficits:  . Lacks social connections  Please see past updates related to this goal by clicking on the "Past Updates" button in the selected goal        Eula Fried, [place], MSW, [email or web address] Phone: [phone]

## 2020-07-28 ENCOUNTER — Other Ambulatory Visit: Payer: Self-pay

## 2020-07-28 DIAGNOSIS — K219 Gastro-esophageal reflux disease without esophagitis: Secondary | ICD-10-CM

## 2020-07-28 DIAGNOSIS — G894 Chronic pain syndrome: Secondary | ICD-10-CM

## 2020-07-28 MED ORDER — ESOMEPRAZOLE MAGNESIUM 40 MG PO CPDR: 40.0000 mg | DELAYED_RELEASE_CAPSULE | Freq: Every day | ORAL | 1 refills | Status: DC

## 2020-07-28 MED ORDER — TIZANIDINE HCL 4 MG PO TABS: ORAL_TABLET | ORAL | 1 refills | Status: DC

## 2020-08-05 ENCOUNTER — Ambulatory Visit: Payer: Self-pay | Admitting: General Practice

## 2020-08-05 ENCOUNTER — Telehealth: Payer: Self-pay | Admitting: General Practice

## 2020-08-05 DIAGNOSIS — I10 Essential (primary) hypertension: Secondary | ICD-10-CM

## 2020-08-05 DIAGNOSIS — F330 Major depressive disorder, recurrent, mild: Secondary | ICD-10-CM

## 2020-08-05 DIAGNOSIS — M797 Fibromyalgia: Secondary | ICD-10-CM

## 2020-08-05 DIAGNOSIS — E785 Hyperlipidemia, unspecified: Secondary | ICD-10-CM

## 2020-08-05 DIAGNOSIS — G894 Chronic pain syndrome: Secondary | ICD-10-CM

## 2020-08-05 NOTE — Telephone Encounter (Signed)
  Chronic Care Management   Note  08/05/2020 Name: Adelai Achey MRN: [id number] DOB: Aug 03, 1955  The patient called the RNCM. Appointment completed. See new encounter.   Follow up plan: Telephone follow up appointment with care management team member scheduled for: 10-07-2020 at 12 am  Noreene Larsson RN, MSN, [place] Mobile: [phone]

## 2020-08-05 NOTE — Patient Instructions (Signed)
Visit Information  PATIENT GOALS: Patient Care Plan: General Social Work (Adult)    Problem Identified: Quality of Life (General Plan of Care)     Long-Range Goal: Quality of Life Maintained   Start Date: 07/14/2020  Priority: Medium  Note:    Timeframe:  Long-Range Goal Priority:  Medium Start Date: 07/14/20            Expected End Date: 10/13/20                     Follow Up Date - 08/24/20   - avoid negative self-talk - develop a personal safety plan - develop a plan to deal with triggers like holidays, anniversaries - exercise at least 2 to 3 times per week - have a plan for how to handle bad days - journal feelings and what helps to feel better or worse - spend time or talk with others at least 2 to 3 times per week - spend time or talk with others every day - watch for early signs of feeling worse - write in journal every day    Why is this important?    Keeping track of your progress will help your treatment team find the right mix of medicine and therapy for you.   Write in your journal every day.   Day-to-day changes in depression symptoms are normal. It may be more helpful to check your progress at the end of each week instead of every day.     Current Barriers:  . Chronic Mental Health needs related to pain and major depressive disorder . Financial constraints related to managing health care expenses . Limited social support . ADL IADL limitations . Mental Health Concerns  . Limited access to caregiver . Suicidal Ideation/Homicidal Ideation: No  Clinical Social Work Goal(s):  Marland Kitchen Over the next 120 days, patient will work with SW bi-monthly by telephone or in person to reduce or manage symptoms related to chronic pain and depression . Over the next 120 days, patient will work with SW to address concerns related to ongoing stress, isolation, pain and depressive symptoms  Interventions: . Patient interviewed and appropriate assessments performed: brief mental health  assessment . Patient was informed that current CCM LCSW will be leaving position next month and her next CCM Social Work follow up visit will be with another LCSW. Patient was appreciative of support provided and receptive to news . Patient reports ongoing chronic pain but that she is having a good day with her pain management today on 07/14/20.  Patient is interested in a possible rheumatologist referral. Patient was encouraged to discuss this with PCP during next office visit. Patient is currently seeing a pain management physician. Patient reports that she met new pain physician and received trigger point injections that have been helpful with managing her pain. Patient reports that her pain is currently manageable even with the cold weather but she had a bad day yesterday on 05/05/20. Patient reports that she received injections directly into her SS joints which helped to relieve some pain. However, patient recently was in the bed for 2 weeks because her pain was so bad but reports that it is better now. Patient reports that she will be able to get out and go to [employer] today because her pain is manageable.  . Patient completed all COVID vaccinations. She reports that she will get her second booster shot on 07/19/20.  Marland Kitchen CCM LCSW was unable to reach patient on 06/09/20 but  spoke with her sister Bethena Roys. Bethena Roys reports that patient is doing very well and spoke with her yesterday. Sister shares that patient struggles with her daily pain but still pushes herself to get out of the house and be active and social. Sister shares that she thinks patient's new medication for depression/anxiety management is effectively working for her. Sister reports that she is proud of patient's recent increase in self-care and progress. Sister reports that patient is stable and declines any urgent needs at this time. Family is agreeable to contact CFP or CCM LCSW directly if any concerns arise. Update- Patient's sister will be coming to  visit patient in a few weeks which has lifted patient's spirits and mood.  Marland Kitchen LCSW discussed coping skills for anxiety. SW used empathetic and active and reflective listening, validated patient's feelings/concerns, and provided emotional support. LCSW provided self-care education to help manage her multiple health conditions and improve her mood. Patient was put on Lexapro on 04/19/20 by PCP to help manage her depression management.  . Provided patient with information about available mental health support resources within the area . Discussed plans with patient for ongoing care management follow up and provided patient with direct contact information for care management team . Advised patient to contact CCM Social Worker for urgent mental health needs/case management needs . Assisted patient/caregiver with obtaining information about health plan benefits . A voluntary and extensive discussion about advanced care planning including explanation and discussion of advanced was undertaken with the patient.  Explanation regarding healthcare proxy and living will was reviewed. . Patient admits that she could be more active. Healthy self-care education provided. Patient does not wish to return to the [facility] until COVID has calmed down. . Provided education to patient/caregiver about Hospice and/or Palliative Care services . Brief CBT provided throughout entire session. Patient does not want psychiatric referral at this time.  . Active listening utilized, counseling provided, current coping strategies identified, decision-making supported, healthy lifestyle promoted, journaling promoted, meditative movement therapy encouraged, mindfulness encouraged, social relationships promoted and strategies to maintain health and wellness promoted.  . Patient reports that her blood pressure has been good lately and that she keeps track of her blood pressure log daily.  . Patient reports that she has been staying at home often to  prevent getting COVID but went out today to get her car serviced. Patient reports that it feels good to be out of the house today with the sun shining.  . Patient reports that the weather affects her pain and she has been able to be more active with the nice weather and limited chronic pain. Patient has been doing [employer].  . Patient reports concern regarding billing for CCM Services. LCSW provided education centered around [facility] management and our services. Patient prefers 90 day follow up calls from entire team due to limited finances. LCSW updated entire CCM team and PCP.  Marland Kitchen She shares that her pain doctor found burn marks on her back from using her heating pad at night to alleviate her pain. Pain management coping skill education provided.  . Patient has a sick friend in the hospital which is causing an increase in her stress and anxiety because she is worried about her well-being.  . Patient was upset that she was taken off of ibuprofen due to kidney side effects but shares that her pain doctor prescribed her tramadol for pain relief.  . Patient was positive for color guard is aware that she needs to address  this concern but does not wish to discuss or address this until after COVID settles down.   Patient Self Care Activities:  . Ability for insight . Motivation for treatment . Strong family or social support  Patient Coping Strengths:  . Hopefulness . Self Advocate . Able to Communicate Effectively  Patient Self Care Deficits:  . Lacks social connections  Please see past updates related to this goal by clicking on the "Past Updates" button in the selected goal     Task: Support and Maintain Acceptable Degree of Health, Comfort and Happiness   Note:   Care Management Activities:    - affirmation provided - community involvement promoted - expression of thoughts about present/future encouraged - independence in all possible areas promoted - life review by  storytelling encouraged - palliative care plan developed - patient strengths promoted - psychosocial concerns monitored - self-expression encouraged - sleep diary encouraged - sleep hygiene techniques encouraged - social relationships promoted - strategies to maintain hearing and/or vision promoted - strategies to maintain intimacy promoted - wellness behaviors promoted    Notes:    Patient Care Plan: RNCM: Depression (Adult)    Problem Identified: RNCM: Depression Identification (Depression) and Anxiety   Priority: Medium    Long-Range Goal: RNCM: Depressive Symptoms Identified   Note:   Current Barriers:  Marland Kitchen Knowledge Deficits related to resources to help with effective management of depression and anxiety . Chronic Disease Management support and education needs related to management of depression and anxiety  . Lacks caregiver support.  . Unable to independently manage depression and anxiety . Lacks social connections . Does not contact provider office for questions/concerns  Nurse Case Manager Clinical Goal(s):  Marland Kitchen Over the next 120 days, patient will verbalize understanding of plan for effective management of depression and anxiety . Over the next 120 days, patient will work with [facility] team and pcp to address needs related to depression and anxiety . Over the next 120 days, patient will attend all scheduled medical appointments: 05-19-2020 at 1:20 pm  Interventions:  . 1:1 collaboration with Lorine Bears Lupita Raider, FNP regarding development and update of comprehensive plan of care as evidenced by provider attestation and co-signature . Inter-disciplinary care team collaboration (see longitudinal plan of care) . Evaluation of current treatment plan related to depression and anxiety  and patient's adherence to plan as established by provider. 08-05-2020: The patient is doing well currently with managing her depression. When the weather is better she is feeling better. She denies any  acute distress and knows to call for changes in her depression or anxiety.  . Advised patient to call the office for changes in mood/anxiety/depression . Provided education to patient re: mindfulness, things to work toward to keep mood stable, support and education on effective ways to deal with stressors in life. 08-05-2020: The patient feels better since her pain is being managed. She is enjoying sitting out on her deck when it is warmer. Her family visited recently and this was helpful for her also.  . Reviewed medications with patient and discussed the patient has switched from Cymbalta to Lexapro and can see a positive change already. She feels like it is going to help her a lot. 08-05-2020: The patient is doing well with medications, denies any new concerns.  . Reviewed scheduled/upcoming provider appointments including: saw pcp in February, no upcoming appointments but knows to call for changes or concerns.   Patient Goals/Self-Care Activities Over the next 120 days, patient will:  -  Patient will self administer medications as prescribed Patient will attend all scheduled provider appointments Patient will call pharmacy for medication refills Patient will attend church or other social activities Patient will continue to perform IADL's independently Patient will call provider office for new concerns or questions Patient will work with BSW to address care coordination needs and will continue to work with the clinical team to address health care and disease management related needs.   - anxiety screen reviewed - depression screen reviewed - medication list reviewed  Follow Up Plan: Telephone follow up appointment with care management team member scheduled for: 10-07-2020 at 1030 am       Task: RNCM: Identify Depressive Symptoms and Facilitate Treatment   Note:   Care Management Activities:    - anxiety screen reviewed - depression screen reviewed - medication list reviewed        Patient Care Plan: RNCM: Chronic Pain (Adult)    Problem Identified: RNCM: Pain Management Plan (Chronic Pain)   Priority: Medium    Long-Range Goal: Pain Management Plan Developed   Priority: Medium  Note:   Current Barriers:  Marland Kitchen Knowledge Deficits related to managing acute/chronic pain . Non-adherence to scheduled provider appointments . Non-adherence to prescribed medication regimen . Difficulty obtaining medications . Chronic Disease Management support and education needs related to chronic pain . Unable to independently manage pain and discomfort . Lacks social connections . Does not contact provider office for questions/concerns  Nurse Case Manager Clinical Goal(s):  Marland Kitchen Over the next 120 days, patient will verbalize understanding of plan for managing pain . Over the next 120 days, patient will meet with RN Care Manager to address needs  . Over the next 120 days, patient will attend all scheduled medical appointments: no upcoming pcp appointments, sees pain specialist in May. . Over the next  120 days patient will demonstrate use of different relaxation  skills and/or diversional activities to assist with pain reduction (distraction, imagery, relaxation, massage, acupressure, TENS, heat, and cold application . Over the next  120  days patient will report pain at a level less than 3 to 4 on a 10-10 rating scale . Over the next   120 days patient will use pharmacological and nonpharmacological pain relief strategies . Over the next  120 days patient will verbalize acceptable level of pain relief and ability to engage in desired activities . Over the next   120 days patient will engage in desired activities without an increase in pain level  Interventions:  . Collaboration with Malfi, Lupita Raider, FNP regarding development and update of comprehensive plan of care as evidenced by provider attestation and co-signature . Inter-disciplinary care team collaboration (see longitudinal plan of  care) . - deep breathing, relaxation and mindfulness use promoted . - enrollment in pain education program arranged. 08-05-2020: The patient is working with the pain specialist and is getting injections every 3 months. This is helping manage her pain better . - motivation and barriers to change assessed and addressed. 08-05-2020: Is happy about the warmer weather and getting outside sitting on her deck. Denies any new issues at this time.  . - mutually acceptable comfort goal set . - pain assessed- 08-05-2020: Denies any pain at this time. Is effectively managing pain at this time and is happy with positive changes  . - pain treatment goals reviewed. 08-05-2020: Is seeing pain specialist and gets shots every 3 months. Will see again in May . Evaluation of current treatment plan related to fibromyalgia  and chronic pain and patient's adherence to plan as established by provider. . Advised patient to call the office for changes in pain level or intensity  . Provided education to patient re: effective pain management  . Reviewed medications with patient and discussed compliance  . Discussed plans with patient for ongoing care management follow up and provided patient with direct contact information for care management team . Allow patient to maintain a diary of pain ratings, timing, precipitating events, medications, treatments, and what works best to relieve pain,  . Refer to support groups and self-help groups . Educate patient about the use of pharmacological interventions for pain management- antianxiety, antidepressants, NSAIDS, opioid analgesics,  . Explain the importance of lifestyle modifications to effective pain management   Patient Goals/Self Care Activities:  . Patient verbalizes understanding of plan to manage pain and discomfort related to fibromyalgia . Self-administers medications as prescribed . Attends all scheduled provider appointments . Calls pharmacy for medication refills . Calls  provider office for new concerns or questions . - mutually acceptable comfort goal set . - pain assessed . - pain management plan developed . - pain treatment goals reviewed . - patient response to treatment assessed . - sharing of pain management plan with teachers and other caregivers encouraged  Follow Up Plan: Telephone follow up appointment with care management team member scheduled for: 10-07-2020 at 85 am     Task: RNCM: Partner to Develop Chronic Pain Management Plan   Note:   Care Management Activities:    - mutually acceptable comfort goal set - pain assessed - pain management plan developed - pain treatment goals reviewed - patient response to treatment assessed - sharing of pain management plan with teachers and other caregivers encouraged       Patient Care Plan: RNCM: Hypertension (Adult)    Problem Identified: RNCM: Hypertension (Hypertension)   Priority: Medium    Goal: RNCM: Hypertension Monitored   Priority: Medium  Note:   Objective:  . Last practice recorded BP readings:  . BP Readings from Last 3 Encounters: .  05/19/20 . (!) 182/80 .  04/19/20 . (!) 141/68 .  11/21/19 . 128/74 .    Marland Kitchen Most recent eGFR/CrCl: No results found for: EGFR  No components found for: CRCL Current Barriers:  Marland Kitchen Knowledge Deficits related to basic understanding of hypertension pathophysiology and self care management . Knowledge Deficits related to understanding of medications prescribed for management of hypertension . Limited Social Support . Unable to independently manage HTN . Unable to self administer medications as prescribed . Lacks social connections . Does not contact provider office for questions/concerns Case Manager Clinical Goal(s):  Marland Kitchen Over the next 120 days, patient will verbalize understanding of plan for hypertension management . Over the next 120 days, patient will attend all scheduled medical appointments: No upcoming appointments but knows to call for  changes.  . Over the next 120 days, patient will demonstrate improved adherence to prescribed treatment plan for hypertension as evidenced by taking all medications as prescribed, monitoring and recording blood pressure as directed, adhering to low sodium/DASH diet . Over the next 120 days, patient will demonstrate improved health management independence as evidenced by checking blood pressure as directed and notifying PCP if SBP>160 or DBP > 90, taking all medications as prescribe, and adhering to a low sodium diet as discussed. Interventions:  . Collaboration with Malfi, Lupita Raider, FNP regarding development and update of comprehensive plan of care as evidenced by provider attestation and co-signature .  Inter-disciplinary care team collaboration (see longitudinal plan of care) . Evaluation of current treatment plan related to hypertension self management and patient's adherence to plan as established by provider. 08-05-2020: The patient states she is doing better with her blood pressures. States it is WNL now that she is controlling her pain and the weather is warmer. Will continue to monitor.  . Provided education to patient re: stroke prevention, s/s of heart attack and stroke, DASH diet, complications of uncontrolled blood pressure . Reviewed medications with patient and discussed importance of compliance. 08-05-2020: States compliance with medications.  . Discussed plans with patient for ongoing care management follow up and provided patient with direct contact information for care management team . Advised patient, providing education and rationale, to monitor blood pressure daily and record, calling PCP for findings outside established parameters.  . Reviewed scheduled/upcoming provider appointments including: No upcoming appointments. Knows to call the office for changes  Patient Goals/Self-Care Activities . Over the next 120 days, patient will:  - Self administers medications as  prescribed Attends all scheduled provider appointments Calls provider office for new concerns, questions, or BP outside discussed parameters Checks BP and records as discussed Follows a low sodium diet/DASH diet - blood pressure trends reviewed - depression screen reviewed - home or ambulatory blood pressure monitoring encouraged Follow Up Plan: Telephone follow up appointment with care management team member scheduled for: 10-07-2020 at 1030 am   Task: RNCM: Identify and Monitor Blood Pressure Elevation   Note:   Care Management Activities:    - blood pressure trends reviewed - depression screen reviewed - home or ambulatory blood pressure monitoring encouraged       Patient Care Plan: RNCM: HLD    Problem Identified: RNCM: Management of HLD   Priority: Medium    Long-Range Goal: RNCM: Management of HLD   Note:   Current Barriers:  . Poorly controlled hyperlipidemia, complicated by smoking, depression, episodes of Hypotension . Current antihyperlipidemic regimen: Zetia 10 mg daily, pravastatin 20 mg daily . Most recent lipid panel:  . Lab Results .  Component . Value . Date .   Marland Kitchen CHOL . 259 (H) . 11/21/2019 .   Marland Kitchen CHOL . 270 (H) . 07/25/2019 .   Marland Kitchen Lab Results .  Component . Value . Date .   Marland Kitchen HDL . 56 . 11/21/2019 .   Marland Kitchen HDL . 50 . 07/25/2019 .   Marland Kitchen Lab Results .  Component . Value . Date .   Marland Kitchen [place] . 174 (H) . 11/21/2019 .   Marland Kitchen [place] . 193 (H) . 07/25/2019 .   Marland Kitchen Lab Results .  Component . Value . Date .   Marland Kitchen TRIG . 147 . 11/21/2019 .   Marland Kitchen TRIG . 122 . 07/25/2019 .   Marland Kitchen Lab Results .  Component . Value . Date .   Marland Kitchen CHOLHDL . 4.6 . 11/21/2019 .   Marland Kitchen CHOLHDL . 5.4 (H) . 07/25/2019 .   Marland Kitchen No results found for: LDLDIRECT  . ASCVD risk enhancing conditions: age 80, HTN, current smoker . Unable to independently manage HLD . Lacks social connections . Does not contact provider office for questions/concerns  RN Care Manager Clinical Goal(s):  Marland Kitchen Over the next 120  days, patient will work with Consulting civil engineer, providers, and care team towards execution of optimized self-health management plan . Over the next 120 days, patient will verbalize understanding of plan for effective management of hLD . Over the next 120  days, patient will work with RNCM, pcp, and CCM team  to address needs related to HLD . Over the next 120 days, patient will attend all scheduled medical appointments: 05-19-2020 at 1:20 pm  Interventions: . Collaboration with Malfi, Lupita Raider, FNP regarding development and update of comprehensive plan of care as evidenced by provider attestation and co-signature . Inter-disciplinary care team collaboration (see longitudinal plan of care) . Medication review performed; medication list updated in electronic medical record.  Bertram Savin care team collaboration (see longitudinal plan of care) . Referred to pharmacy team for assistance with HLD medication management . Evaluation of current treatment plan related to HLD and patient's adherence to plan as established by provider. 08-05-2020: The patient is doing well with management of HLD. Eating well and managing her blood pressure. Review of the risk of heart attack and stroke, will continue to monitor.  . Advised patient to call for changes in condition or new questions . Reviewed medications with patient and discussed compliance  . Reviewed scheduled/upcoming provider appointments including: No upcoming appointments, the patient knows to call for needs or questions    Patient Goals/Self-Care Activities: . Over the next 120 days, patient will:   - call for medicine refill 2 or 3 days before it runs out - call if I am sick and can't take my medicine - keep a list of all the medicines I take; vitamins and herbals too - learn to read medicine labels - use a pillbox to sort medicine - use an alarm clock or phone to remind me to take my medicine - change to whole grain breads, cereal, pasta -  drink 6 to 8 glasses of water each day - eat 5 or 6 small meals each day - fill half the plate with nonstarchy vegetables - limit fast food meals to no more than 1 per week - manage portion size - prepare main meal at home 3 to 5 days each week - read food labels for fat, fiber, carbohydrates and portion size - be open to making changes - I can manage, know and watch for signs of a heart attack - if I have chest pain, call for help - learn about small changes that will make a big difference - learn my personal risk factors - education plan reviewed and/or amended - empathy and reassurance conveyed - family/caregiver participation in learning encouraged - health literacy screen reviewed - patient's preferred learning methods utilized - privacy ensured - questions encouraged - readiness to learn monitored   Follow Up Plan: Telephone follow up appointment with care management team member scheduled for: 10-07-2020 at 1030 am     Task: RNCM: HLD   Note:   Care Management Activities:    - education plan reviewed and/or amended - empathy and reassurance conveyed - family/caregiver participation in learning encouraged - health literacy screen reviewed - patient's preferred learning methods utilized - privacy ensured - questions encouraged - readiness to learn monitored         Patient verbalizes understanding of instructions provided today and agrees to view in [place].   Telephone follow up appointment with care management team member scheduled for: 10-07-2020 at 73 am  Noreene Larsson RN, MSN, [facility] [place] Mobile: [phone]

## 2020-08-05 NOTE — Chronic Care Management (AMB) (Signed)
Chronic Care Management   CCM RN Visit Note  08/05/2020 Name: April Price MRN: [id number] DOB: 1955/04/25  Subjective: April Price is a 64 y.o. year old female who is a primary care patient of Lorine Bears, Lupita Raider, FNP. The care management team was consulted for assistance with disease management and care coordination needs.    Engaged with patient by telephone for follow up visit in response to provider referral for case management and/or care coordination services.   Consent to Services:  The patient was given information about Chronic Care Management services, agreed to services, and gave verbal consent prior to initiation of services.  Please see initial visit note for detailed documentation.   Patient agreed to services and verbal consent obtained.   Assessment: Review of patient past medical history, allergies, medications, health status, including review of consultants reports, laboratory and other test data, was performed as part of comprehensive evaluation and provision of chronic care management services.   SDOH (Social Determinants of Health) assessments and interventions performed:    CCM Care Plan  Allergies  Allergen Reactions  . Penicillins Anaphylaxis    Has patient had a PCN reaction causing immediate rash, facial/tongue/throat swelling, SOB or lightheadedness with hypotension: Yes Has patient had a PCN reaction causing severe rash involving mucus membranes or skin necrosis: No Has patient had a PCN reaction that required hospitalization No Has patient had a PCN reaction occurring within the last 10 years: Yes If all of the above answers are "NO", then may proceed with Cephalosporin use.   . Tylenol [Acetaminophen] Anaphylaxis, Itching and Hives    Ears and throat itch   . Morphine And Related Other (See Comments)    Depression   . Nickel Itching  . Pepcid [Famotidine] Nausea And Vomiting  . Shrimp [Shellfish Allergy] Hives  . Tape      irritation  . Tetanus Toxoids Other (See Comments)    Swelling at injection site and fever  . Wheat Bran   . Corn-Containing Products     Has hay fever and swelling in fingers.  . Gabapentin Other (See Comments)    Makes her feel "spacy" off balance, like she is coming out of anesthesia.     Outpatient Encounter Medications as of 08/05/2020  Medication Sig  . albuterol (PROVENTIL HFA;VENTOLIN HFA) 108 (90 Base) MCG/ACT inhaler Inhale 2 puffs into the lungs every 4 (four) hours as needed for wheezing or shortness of breath.  Marland Kitchen alendronate (FOSAMAX) 70 MG tablet Take 1 tablet (70 mg total) by mouth every 7 (seven) days. Take with a full glass of water on an empty stomach.  Marland Kitchen azelastine (ASTELIN) 0.1 % nasal spray Place 2 sprays into both nostrils 2 (two) times daily as needed for rhinitis. Use in each nostril as directed  . busPIRone (BUSPAR) 10 MG tablet Take 1 tablet (10 mg total) by mouth 3 (three) times daily. (Patient not taking: Reported on 06/01/2020)  . Cholecalciferol (VITAMIN D3) 50 MCG (2000 UT) TABS Take 1 tablet by mouth daily.  Marland Kitchen EPINEPHrine (EPIPEN 2-PAK) 0.3 mg/0.3 mL IJ SOAJ injection Inject 0.3 mLs (0.3 mg total) into the muscle once for 1 dose.  . escitalopram (LEXAPRO) 5 MG tablet Take 1 tablet (5 mg total) by mouth daily.  Marland Kitchen esomeprazole (NEXIUM) 40 MG capsule Take 1 capsule (40 mg total) by mouth daily.  Marland Kitchen ezetimibe (ZETIA) 10 MG tablet Take 1 tablet (10 mg total) by mouth daily.  . fluticasone (FLONASE) 50 MCG/ACT nasal spray Place  2 sprays into both nostrils daily.  . meclizine (ANTIVERT) 25 MG tablet Take 1 tablet (25 mg total) by mouth 3 (three) times daily as needed for dizziness.  . montelukast (SINGULAIR) 10 MG tablet Take 1 tablet (10 mg total) by mouth at bedtime.  . Multiple Vitamins-Minerals (PRESERVISION AREDS 2 PO) Take by mouth.  . pravastatin (PRAVACHOL) 20 MG tablet Take 1 tablet (20 mg total) by mouth daily.  Marland Kitchen tiZANidine (ZANAFLEX) 4 MG tablet TAKE ONE  TABLET FOUR TIMES DAILY AS NEEDED FOR MUSCLE SPASM  . traMADol (ULTRAM) 50 MG tablet Take 50 mg by mouth every 8 (eight) hours as needed.  . Vitamin D, Ergocalciferol, (DRISDOL) 1.25 MG (50000 UNIT) CAPS capsule Take 1 capsule (50,000 Units total) by mouth every 7 (seven) days. (Patient not taking: Reported on 06/01/2020)   No facility-administered encounter medications on file as of 08/05/2020.    Patient Active Problem List   Diagnosis Date Noted  . Positive colorectal cancer screening using Cologuard test 04/19/2020  . Osteopenia 11/21/2019  . Encounter for screening mammogram for malignant neoplasm of breast 11/21/2019  . Hyperlipidemia 11/21/2019  . GERD (gastroesophageal reflux disease) 11/21/2019  . Joint pain 08/22/2019  . 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 Chronic lumbar radicular pain (Left) 04/18/2016  . Vitamin D deficiency 03/07/2016  . Musculoskeletal pain 02/11/2016  . Neurogenic pain 02/11/2016  . Chronic pain syndrome 02/10/2016  . Long term current use of opiate analgesic 02/10/2016  . Long term prescription opiate use 02/10/2016  . Opiate use (80-140 MME/Day) 02/10/2016  . Chronic shoulder pain (Location of Primary Source of Pain) (Bilateral) (L>R) 02/10/2016  . Chronic hip pain (Location of Secondary source of pain) (Bilateral) (L>R) 02/10/2016  . Chronic sacroiliac joint pain (Location of Tertiary source of pain) (Bilateral) (L>R) 02/10/2016  . Chronic low back pain (Bilateral) (L>R) 02/10/2016  . 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 History of lumbar fusion 02/10/2016  . Chronic lower extremity pain (Bilateral) (L>R) 02/10/2016  . Chronic knee pain (Bilateral) (L>R) 02/10/2016  . Chronic elbow pain (Bilateral) (L>R) 02/10/2016  . Chronic hand pain (Bilateral) (L>R) 02/10/2016  . History of fibrocystic disease of breast 01/08/2013  . Dry eyes, bilateral 02/06/2012  . Hypoglycemia 02/06/2012  . Hypertension 02/06/2012  . Chronic fatigue syndrome  02/06/2012  . Osteoporosis 02/06/2012  . Vaginal atrophy 02/06/2012  . Spasm of back muscles 01/31/2011  . Chronic neck pain 01/31/2011  . Depression 01/13/2011  . History of seasonal allergies 01/13/2011  . Situational disturbance 01/13/2011  . Fibromyalgia 01/12/2011    Conditions to be addressed/monitored:HTN, HLD, Depression and chronic pain   Care Plan : RNCM: Depression (Adult)  Updates made by Vanita Ingles since 08/05/2020 12:00 AM    Problem: RNCM: Depression Identification (Depression) and Anxiety   Priority: Medium    Long-Range Goal: RNCM: Depressive Symptoms Identified   Note:   Current Barriers:  Marland Kitchen Knowledge Deficits related to resources to help with effective management of depression and anxiety . Chronic Disease Management support and education needs related to management of depression and anxiety  . Lacks caregiver support.  . Unable to independently manage depression and anxiety . Lacks social connections . Does not contact provider office for questions/concerns  Nurse Case Manager Clinical Goal(s):  Marland Kitchen Over the next 120 days, patient will verbalize understanding of plan for effective management of depression and anxiety . Over the next 120 days, patient will work with [facility] [place] team and pcp to address needs related  to depression and anxiety . Over the next 120 days, patient will attend all scheduled medical appointments: 05-19-2020 at 1:20 pm  Interventions:  . 1:1 collaboration with Lorine Bears Lupita Raider, FNP regarding development and update of comprehensive plan of care as evidenced by provider attestation and co-signature . Inter-disciplinary care team collaboration (see longitudinal plan of care) . Evaluation of current treatment plan related to depression and anxiety  and patient's adherence to plan as established by provider. 08-05-2020: The patient is doing well currently with managing her depression. When the weather is better she is feeling better. She denies  any acute distress and knows to call for changes in her depression or anxiety.  . Advised patient to call the office for changes in mood/anxiety/depression . Provided education to patient re: mindfulness, things to work toward to keep mood stable, support and education on effective ways to deal with stressors in life. 08-05-2020: The patient feels better since her pain is being managed. She is enjoying sitting out on her deck when it is warmer. Her family visited recently and this was helpful for her also.  . Reviewed medications with patient and discussed the patient has switched from Cymbalta to Lexapro and can see a positive change already. She feels like it is going to help her a lot. 08-05-2020: The patient is doing well with medications, denies any new concerns.  . Reviewed scheduled/upcoming provider appointments including: saw pcp in February, no upcoming appointments but knows to call for changes or concerns.   Patient Goals/Self-Care Activities Over the next 120 days, patient will:  - Patient will self administer medications as prescribed Patient will attend all scheduled provider appointments Patient will call pharmacy for medication refills Patient will attend church or other social activities Patient will continue to perform IADL's independently Patient will call provider office for new concerns or questions Patient will work with BSW to address care coordination needs and will continue to work with the clinical team to address health care and disease management related needs.   - anxiety screen reviewed - depression screen reviewed - medication list reviewed  Follow Up Plan: Telephone follow up appointment with care management team member scheduled for: 10-07-2020 at 61 am       Care Plan : RNCM: Chronic Pain (Adult)  Updates made by Vanita Ingles since 08/05/2020 12:00 AM    Problem: RNCM: Pain Management Plan (Chronic Pain)   Priority: Medium    Long-Range Goal: Pain  Management Plan Developed   Priority: Medium  Note:   Current Barriers:  Marland Kitchen Knowledge Deficits related to managing acute/chronic pain . Non-adherence to scheduled provider appointments . Non-adherence to prescribed medication regimen . Difficulty obtaining medications . Chronic Disease Management support and education needs related to chronic pain . Unable to independently manage pain and discomfort . Lacks social connections . Does not contact provider office for questions/concerns  Nurse Case Manager Clinical Goal(s):  Marland Kitchen Over the next 120 days, patient will verbalize understanding of plan for managing pain . Over the next 120 days, patient will meet with RN Care Manager to address needs  . Over the next 120 days, patient will attend all scheduled medical appointments: no upcoming pcp appointments, sees pain specialist in May. . Over the next  120 days patient will demonstrate use of different relaxation  skills and/or diversional activities to assist with pain reduction (distraction, imagery, relaxation, massage, acupressure, TENS, heat, and cold application . Over the next  120  days patient will report pain  at a level less than 3 to 4 on a 10-10 rating scale . Over the next   120 days patient will use pharmacological and nonpharmacological pain relief strategies . Over the next  120 days patient will verbalize acceptable level of pain relief and ability to engage in desired activities . Over the next   120 days patient will engage in desired activities without an increase in pain level  Interventions:  . Collaboration with Malfi, Lupita Raider, FNP regarding development and update of comprehensive plan of care as evidenced by provider attestation and co-signature . Inter-disciplinary care team collaboration (see longitudinal plan of care) . - deep breathing, relaxation and mindfulness use promoted . - enrollment in pain education program arranged. 08-05-2020: The patient is working with the  pain specialist and is getting injections every 3 months. This is helping manage her pain better . - motivation and barriers to change assessed and addressed. 08-05-2020: Is happy about the warmer weather and getting outside sitting on her deck. Denies any new issues at this time.  . - mutually acceptable comfort goal set . - pain assessed- 08-05-2020: Denies any pain at this time. Is effectively managing pain at this time and is happy with positive changes  . - pain treatment goals reviewed. 08-05-2020: Is seeing pain specialist and gets shots every 3 months. Will see again in May . Evaluation of current treatment plan related to fibromyalgia and chronic pain and patient's adherence to plan as established by provider. . Advised patient to call the office for changes in pain level or intensity  . Provided education to patient re: effective pain management  . Reviewed medications with patient and discussed compliance  . Discussed plans with patient for ongoing care management follow up and provided patient with direct contact information for care management team . Allow patient to maintain a diary of pain ratings, timing, precipitating events, medications, treatments, and what works best to relieve pain,  . Refer to support groups and self-help groups . Educate patient about the use of pharmacological interventions for pain management- antianxiety, antidepressants, NSAIDS, opioid analgesics,  . Explain the importance of lifestyle modifications to effective pain management   Patient Goals/Self Care Activities:  . Patient verbalizes understanding of plan to manage pain and discomfort related to fibromyalgia . Self-administers medications as prescribed . Attends all scheduled provider appointments . Calls pharmacy for medication refills . Calls provider office for new concerns or questions . - mutually acceptable comfort goal set . - pain assessed . - pain management plan developed . - pain  treatment goals reviewed . - patient response to treatment assessed . - sharing of pain management plan with teachers and other caregivers encouraged  Follow Up Plan: Telephone follow up appointment with care management team member scheduled for: 10-07-2020 at 90 am     Care Plan : RNCM: Hypertension (Adult)  Updates made by Vanita Ingles since 08/05/2020 12:00 AM    Problem: RNCM: Hypertension (Hypertension)   Priority: Medium    Goal: RNCM: Hypertension Monitored   Priority: Medium  Note:   Objective:  . Last practice recorded BP readings:  . BP Readings from Last 3 Encounters: .  05/19/20 . (!) 182/80 .  04/19/20 . (!) 141/68 .  11/21/19 . 128/74 .    Marland Kitchen Most recent eGFR/CrCl: No results found for: EGFR  No components found for: CRCL Current Barriers:  Marland Kitchen Knowledge Deficits related to basic understanding of hypertension pathophysiology and self care management . Knowledge  Deficits related to understanding of medications prescribed for management of hypertension . Limited Social Support . Unable to independently manage HTN . Unable to self administer medications as prescribed . Lacks social connections . Does not contact provider office for questions/concerns Case Manager Clinical Goal(s):  Marland Kitchen Over the next 120 days, patient will verbalize understanding of plan for hypertension management . Over the next 120 days, patient will attend all scheduled medical appointments: No upcoming appointments but knows to call for changes.  . Over the next 120 days, patient will demonstrate improved adherence to prescribed treatment plan for hypertension as evidenced by taking all medications as prescribed, monitoring and recording blood pressure as directed, adhering to low sodium/DASH diet . Over the next 120 days, patient will demonstrate improved health management independence as evidenced by checking blood pressure as directed and notifying PCP if SBP>160 or DBP > 90, taking all medications  as prescribe, and adhering to a low sodium diet as discussed. Interventions:  . Collaboration with Malfi, Lupita Raider, FNP regarding development and update of comprehensive plan of care as evidenced by provider attestation and co-signature . Inter-disciplinary care team collaboration (see longitudinal plan of care) . Evaluation of current treatment plan related to hypertension self management and patient's adherence to plan as established by provider. 08-05-2020: The patient states she is doing better with her blood pressures. States it is WNL now that she is controlling her pain and the weather is warmer. Will continue to monitor.  . Provided education to patient re: stroke prevention, s/s of heart attack and stroke, DASH diet, complications of uncontrolled blood pressure . Reviewed medications with patient and discussed importance of compliance. 08-05-2020: States compliance with medications.  . Discussed plans with patient for ongoing care management follow up and provided patient with direct contact information for care management team . Advised patient, providing education and rationale, to monitor blood pressure daily and record, calling PCP for findings outside established parameters.  . Reviewed scheduled/upcoming provider appointments including: No upcoming appointments. Knows to call the office for changes  Patient Goals/Self-Care Activities . Over the next 120 days, patient will:  - Self administers medications as prescribed Attends all scheduled provider appointments Calls provider office for new concerns, questions, or BP outside discussed parameters Checks BP and records as discussed Follows a low sodium diet/DASH diet - blood pressure trends reviewed - depression screen reviewed - home or ambulatory blood pressure monitoring encouraged Follow Up Plan: Telephone follow up appointment with care management team member scheduled for: 10-07-2020 at 1030 am   Care Plan : RNCM: HLD  Updates  made by Vanita Ingles since 08/05/2020 12:00 AM    Problem: RNCM: Management of HLD   Priority: Medium    Long-Range Goal: RNCM: Management of HLD   Note:   Current Barriers:  . Poorly controlled hyperlipidemia, complicated by smoking, depression, episodes of Hypotension . Current antihyperlipidemic regimen: Zetia 10 mg daily, pravastatin 20 mg daily . Most recent lipid panel:  . Lab Results .  Component . Value . Date .   Marland Kitchen CHOL . 259 (H) . 11/21/2019 .   Marland Kitchen CHOL . 270 (H) . 07/25/2019 .   Marland Kitchen Lab Results .  Component . Value . Date .   Marland Kitchen HDL . 56 . 11/21/2019 .   Marland Kitchen HDL . 50 . 07/25/2019 .   Marland Kitchen Lab Results .  Component . Value . Date .   Marland Kitchen [place] . 174 (H) . 11/21/2019 .   Marland Kitchen [place] .  193 (H) . 07/25/2019 .   Marland Kitchen Lab Results .  Component . Value . Date .   Marland Kitchen TRIG . 147 . 11/21/2019 .   Marland Kitchen TRIG . 122 . 07/25/2019 .   Marland Kitchen Lab Results .  Component . Value . Date .   Marland Kitchen CHOLHDL . 4.6 . 11/21/2019 .   Marland Kitchen CHOLHDL . 5.4 (H) . 07/25/2019 .   Marland Kitchen No results found for: LDLDIRECT  . ASCVD risk enhancing conditions: age 86, HTN, current smoker . Unable to independently manage HLD . Lacks social connections . Does not contact provider office for questions/concerns  RN Care Manager Clinical Goal(s):  Marland Kitchen Over the next 120 days, patient will work with Consulting civil engineer, providers, and care team towards execution of optimized self-health management plan . Over the next 120 days, patient will verbalize understanding of plan for effective management of hLD . Over the next 120 days, patient will work with RNCM, pcp, and CCM team  to address needs related to HLD . Over the next 120 days, patient will attend all scheduled medical appointments: 05-19-2020 at 1:20 pm  Interventions: . Collaboration with Malfi, Lupita Raider, FNP regarding development and update of comprehensive plan of care as evidenced by provider attestation and co-signature . Inter-disciplinary care team collaboration (see longitudinal plan  of care) . Medication review performed; medication list updated in electronic medical record.  Bertram Savin care team collaboration (see longitudinal plan of care) . Referred to pharmacy team for assistance with HLD medication management . Evaluation of current treatment plan related to HLD and patient's adherence to plan as established by provider. 08-05-2020: The patient is doing well with management of HLD. Eating well and managing her blood pressure. Review of the risk of heart attack and stroke, will continue to monitor.  . Advised patient to call for changes in condition or new questions . Reviewed medications with patient and discussed compliance  . Reviewed scheduled/upcoming provider appointments including: No upcoming appointments, the patient knows to call for needs or questions    Patient Goals/Self-Care Activities: . Over the next 120 days, patient will:   - call for medicine refill 2 or 3 days before it runs out - call if I am sick and can't take my medicine - keep a list of all the medicines I take; vitamins and herbals too - learn to read medicine labels - use a pillbox to sort medicine - use an alarm clock or phone to remind me to take my medicine - change to whole grain breads, cereal, pasta - drink 6 to 8 glasses of water each day - eat 5 or 6 small meals each day - fill half the plate with nonstarchy vegetables - limit fast food meals to no more than 1 per week - manage portion size - prepare main meal at home 3 to 5 days each week - read food labels for fat, fiber, carbohydrates and portion size - be open to making changes - I can manage, know and watch for signs of a heart attack - if I have chest pain, call for help - learn about small changes that will make a big difference - learn my personal risk factors - education plan reviewed and/or amended - empathy and reassurance conveyed - family/caregiver participation in learning encouraged - health  literacy screen reviewed - patient's preferred learning methods utilized - privacy ensured - questions encouraged - readiness to learn monitored   Follow Up Plan: Telephone follow up appointment with care  management team member scheduled for: 10-07-2020 at 54 am       Plan:Telephone follow up appointment with care management team member scheduled for:  10-07-2020 at 32 am   Noreene Larsson RN, MSN, [place] [place] Mobile: [phone]

## 2020-08-24 ENCOUNTER — Ambulatory Visit: Payer: PPO | Admitting: Licensed Clinical Social Worker

## 2020-08-24 DIAGNOSIS — G894 Chronic pain syndrome: Secondary | ICD-10-CM

## 2020-08-24 DIAGNOSIS — I10 Essential (primary) hypertension: Secondary | ICD-10-CM | POA: Diagnosis not present

## 2020-08-24 DIAGNOSIS — F321 Major depressive disorder, single episode, moderate: Secondary | ICD-10-CM

## 2020-08-24 DIAGNOSIS — M797 Fibromyalgia: Secondary | ICD-10-CM

## 2020-08-24 NOTE — Chronic Care Management (AMB) (Signed)
Chronic Care Management    Clinical Social Work Note  08/24/2020 Name: April Price MRN: [id number] DOB: 12-02-1955  April Price is a 65 y.o. year old female who is a primary care patient of Lorine Bears, Lupita Raider, FNP. The CCM team was consulted to assist the patient with chronic disease management and/or care coordination needs related to: Mental Health Counseling and Resources.   Engaged with patient by telephone for follow up visit in response to provider referral for social work chronic care management and care coordination services.   Consent to Services:  The patient was given information about Chronic Care Management services, agreed to services, and gave verbal consent prior to initiation of services.  Please see initial visit note for detailed documentation.   Patient agreed to services and consent obtained.   Assessment: Patient is engaged in conversation, continues to maintain positive progress with care plan goals. Patient continues to comply with medication management and utilization of healthy coping skills to assist with management of symptoms. See Care Plan below for interventions and patient self-care actives. Recent life changes [name]: Chronic Pain Recommendation: Patient may benefit from, and is in agreement to continue medication management and utilizing strategies discussed.  Follow up Plan: Patient would like continued follow-up.  CCM LCSW will follow up with patient on 10/26/20. Patient will call office if needed prior to next encounter.   SDOH (Social Determinants of Health) assessments and interventions performed:    Advanced Directives Status: Not addressed in this encounter.  CCM Care Plan  Allergies  Allergen Reactions  . Penicillins Anaphylaxis    Has patient had a PCN reaction causing immediate rash, facial/tongue/throat swelling, SOB or lightheadedness with hypotension: Yes Has patient had a PCN reaction causing severe rash involving  mucus membranes or skin necrosis: No Has patient had a PCN reaction that required hospitalization No Has patient had a PCN reaction occurring within the last 10 years: Yes If all of the above answers are "NO", then may proceed with Cephalosporin use.   . Tylenol [Acetaminophen] Anaphylaxis, Itching and Hives    Ears and throat itch   . Morphine And Related Other (See Comments)    Depression   . Nickel Itching  . Pepcid [Famotidine] Nausea And Vomiting  . Shrimp [Shellfish Allergy] Hives  . Tape     irritation  . Tetanus Toxoids Other (See Comments)    Swelling at injection site and fever  . Wheat Bran   . Corn-Containing Products     Has hay fever and swelling in fingers.  . Gabapentin Other (See Comments)    Makes her feel "spacy" off balance, like she is coming out of anesthesia.     Outpatient Encounter Medications as of 08/24/2020  Medication Sig  . albuterol (PROVENTIL HFA;VENTOLIN HFA) 108 (90 Base) MCG/ACT inhaler Inhale 2 puffs into the lungs every 4 (four) hours as needed for wheezing or shortness of breath.  Marland Kitchen alendronate (FOSAMAX) 70 MG tablet Take 1 tablet (70 mg total) by mouth every 7 (seven) days. Take with a full glass of water on an empty stomach.  Marland Kitchen azelastine (ASTELIN) 0.1 % nasal spray Place 2 sprays into both nostrils 2 (two) times daily as needed for rhinitis. Use in each nostril as directed  . busPIRone (BUSPAR) 10 MG tablet Take 1 tablet (10 mg total) by mouth 3 (three) times daily. (Patient not taking: Reported on 06/01/2020)  . Cholecalciferol (VITAMIN D3) 50 MCG (2000 UT) TABS Take 1 tablet by mouth daily.  Marland Kitchen  EPINEPHrine (EPIPEN 2-PAK) 0.3 mg/0.3 mL IJ SOAJ injection Inject 0.3 mLs (0.3 mg total) into the muscle once for 1 dose.  . escitalopram (LEXAPRO) 5 MG tablet Take 1 tablet (5 mg total) by mouth daily.  Marland Kitchen esomeprazole (NEXIUM) 40 MG capsule Take 1 capsule (40 mg total) by mouth daily.  Marland Kitchen ezetimibe (ZETIA) 10 MG tablet Take 1 tablet (10 mg total) by  mouth daily.  . fluticasone (FLONASE) 50 MCG/ACT nasal spray Place 2 sprays into both nostrils daily.  . meclizine (ANTIVERT) 25 MG tablet Take 1 tablet (25 mg total) by mouth 3 (three) times daily as needed for dizziness.  . montelukast (SINGULAIR) 10 MG tablet Take 1 tablet (10 mg total) by mouth at bedtime.  . Multiple Vitamins-Minerals (PRESERVISION AREDS 2 PO) Take by mouth.  . pravastatin (PRAVACHOL) 20 MG tablet Take 1 tablet (20 mg total) by mouth daily.  Marland Kitchen tiZANidine (ZANAFLEX) 4 MG tablet TAKE ONE TABLET FOUR TIMES DAILY AS NEEDED FOR MUSCLE SPASM  . traMADol (ULTRAM) 50 MG tablet Take 50 mg by mouth every 8 (eight) hours as needed.  . Vitamin D, Ergocalciferol, (DRISDOL) 1.25 MG (50000 UNIT) CAPS capsule Take 1 capsule (50,000 Units total) by mouth every 7 (seven) days. (Patient not taking: Reported on 06/01/2020)   No facility-administered encounter medications on file as of 08/24/2020.    Patient Active Problem List   Diagnosis Date Noted  . Positive colorectal cancer screening using Cologuard test 04/19/2020  . Osteopenia 11/21/2019  . Encounter for screening mammogram for malignant neoplasm of breast 11/21/2019  . Hyperlipidemia 11/21/2019  . GERD (gastroesophageal reflux disease) 11/21/2019  . Joint pain 08/22/2019  . 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 Chronic lumbar radicular pain (Left) 04/18/2016  . Vitamin D deficiency 03/07/2016  . Musculoskeletal pain 02/11/2016  . Neurogenic pain 02/11/2016  . Chronic pain syndrome 02/10/2016  . Long term current use of opiate analgesic 02/10/2016  . Long term prescription opiate use 02/10/2016  . Opiate use (80-140 MME/Day) 02/10/2016  . Chronic shoulder pain (Location of Primary Source of Pain) (Bilateral) (L>R) 02/10/2016  . Chronic hip pain (Location of Secondary source of pain) (Bilateral) (L>R) 02/10/2016  . Chronic sacroiliac joint pain (Location of Tertiary source of pain) (Bilateral) (L>R) 02/10/2016  . Chronic low  back pain (Bilateral) (L>R) 02/10/2016  . 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 History of lumbar fusion 02/10/2016  . Chronic lower extremity pain (Bilateral) (L>R) 02/10/2016  . Chronic knee pain (Bilateral) (L>R) 02/10/2016  . Chronic elbow pain (Bilateral) (L>R) 02/10/2016  . Chronic hand pain (Bilateral) (L>R) 02/10/2016  . History of fibrocystic disease of breast 01/08/2013  . Dry eyes, bilateral 02/06/2012  . Hypoglycemia 02/06/2012  . Hypertension 02/06/2012  . Chronic fatigue syndrome 02/06/2012  . Osteoporosis 02/06/2012  . Vaginal atrophy 02/06/2012  . Spasm of back muscles 01/31/2011  . Chronic neck pain 01/31/2011  . Depression 01/13/2011  . History of seasonal allergies 01/13/2011  . Situational disturbance 01/13/2011  . Fibromyalgia 01/12/2011    Conditions to be addressed/monitored: Depression; Mental Health Concerns   Care Plan : General Social Work (Adult)  Updates made by Rebekah Chesterfield, LCSW since 08/24/2020 12:00 AM    Problem: Quality of Life (General Plan of Care)     Long-Range Goal: Quality of Life Maintained   Start Date: 07/14/2020  This Visit's Progress: On track  Priority: Medium  Note:   Timeframe:  Long-Range Goal Priority:  Medium Start Date: 07/14/20  Expected End Date: 11/06/20                     Follow Up Date - 10/26/20  Current Barriers:  . Chronic Mental Health needs related to pain and major depressive disorder . Financial constraints related to managing health care expenses . Limited social support . ADL IADL limitations . Mental Health Concerns  . Limited access to caregiver . Suicidal Ideation/Homicidal Ideation: No  Clinical Social Work Goal(s):  Marland Kitchen Over the next 120 days, patient will work with SW bi-monthly by telephone or in person to reduce or manage symptoms related to chronic pain and depression . Over the next 120 days, patient will work with SW to address concerns related to ongoing stress, isolation,  pain and depressive symptoms  Interventions: . Patient interviewed and appropriate assessments performed: brief mental health assessment . Patient reports decrease in depression and anxiety symptoms triggered by pain . Patient reports that she has been successful with pain management with the assistance of specialist. She has an upcoming appointment scheduled next week with [facility] at the [facility]. She receives injections on her SI joints. This treatment assists with pain relief for approximately 2.5 months  . CCM LCSW inquired about coping skills. Patient reports that pain increases with inclement weather. Since it has been raining often, pt has been intentional on taking her time to prevent falls. She utilizes a cane and rollator walker to improve balance . Patient continues to drive and will utilize friends, who are a strong support system for her, to assist if needed. Family resides in [place] but they talk 2-3 times a week via phone . Patient enjoys reading and spending time with her dog, who keeps her active and is emotionally supportive. She is compliant with medications prescribed by provider . Patient denies any current resource needs . LCSW used empathetic and active and reflective listening, validated patient's feelings/concerns, and provided emotional support . Discussed plans with patient for ongoing care management follow up and provided patient with direct contact information for care management team  Patient Self Care Activities:  . Attend all scheduled appointments with providers . Continue to comply with medication management . Continue utilizing strategies discussed to assist with management of symptoms      Christa See, MSW, [email or web address] Phone [phone] 2:33 PM

## 2020-08-24 NOTE — Patient Instructions (Signed)
Visit Information  Goals Addressed              This Visit's Progress     Patient Stated   .  "I need help with managing my chronic pain and depression." (pt-stated)   On track     Patient Self Care Activities:  . Attend all scheduled appointments with providers . Continue to comply with medication management . Continue utilizing strategies discussed to assist with management of symptoms      Other   .  SW-Track and Manage My Symptoms-Depression   On track     Timeframe:  Long-Range Goal Priority:  Medium Start Date: 07/14/20            Expected End Date: 11/06/20                     Follow Up Date - 10/26/20   Patient Self Care Activities:  . Attend all scheduled appointments with providers . Continue to comply with medication management . Continue utilizing strategies discussed to assist with management of symptoms       Patient verbalizes understanding of instructions provided today.  Telephone follow up appointment with care management team member scheduled for: 10/26/20  Christa See, MSW, [email or web address] Phone [phone] 2:34 PM

## 2020-08-30 DIAGNOSIS — M461 Sacroiliitis, not elsewhere classified: Secondary | ICD-10-CM | POA: Diagnosis not present

## 2020-10-04 DIAGNOSIS — M797 Fibromyalgia: Secondary | ICD-10-CM | POA: Diagnosis not present

## 2020-10-04 DIAGNOSIS — M5136 Other intervertebral disc degeneration, lumbar region: Secondary | ICD-10-CM | POA: Diagnosis not present

## 2020-10-04 DIAGNOSIS — M461 Sacroiliitis, not elsewhere classified: Secondary | ICD-10-CM | POA: Diagnosis not present

## 2020-10-04 DIAGNOSIS — G894 Chronic pain syndrome: Secondary | ICD-10-CM | POA: Diagnosis not present

## 2020-10-07 ENCOUNTER — Telehealth: Payer: Self-pay

## 2020-10-09 ENCOUNTER — Other Ambulatory Visit: Payer: Self-pay | Admitting: Family Medicine

## 2020-10-09 DIAGNOSIS — J3089 Other allergic rhinitis: Secondary | ICD-10-CM

## 2020-10-09 NOTE — Telephone Encounter (Signed)
Requested Prescriptions  Pending Prescriptions Disp Refills  . fluticasone (FLONASE) 50 MCG/ACT nasal spray [Pharmacy Med Name: FLUTICASONE PROPIONATE 50 MCG/ACT N] 16 g 6    Sig: PLACE 2 SPRAYS INTO BOTH NOSTRILS DAILY     Ear, Nose, and Throat: Nasal Preparations - Corticosteroids Passed - 10/09/2020  9:35 AM      Passed - Valid encounter within last 12 months    Recent Outpatient Visits          4 months ago Mild episode of recurrent major depressive disorder [facility])   [facility] Olin Hauser, DO   5 months ago Other depression   [facility], Lupita Raider, FNP   10 months ago Essential hypertension   [place], FNP   1 year ago Arthralgia, unspecified joint   [facility], Lupita Raider, [place]   1 year ago Essential hypertension   [place], FNP      Future Appointments            In 8 months [facility]

## 2020-10-26 ENCOUNTER — Ambulatory Visit: Payer: Self-pay | Admitting: Licensed Clinical Social Worker

## 2020-10-29 NOTE — Chronic Care Management (AMB) (Signed)
Care Management Clinical Social Work Note  10/29/2020 Name: April Price MRN: [id number] DOB: Oct 21, 1955  April Price is a 65 y.o. year old female who is a primary care patient of Jearld Fenton, NP.  The Care Management team was consulted for assistance with chronic disease management and coordination needs.  Engaged with patient by telephone for follow up visit in response to provider referral for social work chronic care management and care coordination services  Consent to Services:  April Price was given information about Care Management services today including:  Care Management services includes personalized support from designated clinical staff supervised by her physician, including individualized plan of care and coordination with other care providers 24/7 contact phone numbers for assistance for urgent and routine care needs. The patient may stop case management services at any time by phone call to the office staff.  Patient agreed to services and consent obtained.   Assessment: Patient is engaged in conversation, continues to maintain positive progress with care plan goals. She endorses stress triggered by financial strain associated with medical bills for CCM services and medical appointments. Leadership has been notified of this and is actively working on a resolution. See Care Plan below for interventions and patient self-care actives.  Recent life changes April Price: Financial Strain  Recommendation: Patient may benefit from, and is in agreement to work with LCSW to address care coordination needs and will continue to work with the clinical team to address health care and disease management related needs.   Follow up Plan: Patient would like continued follow-up from CCM LCSW .  Follow up scheduled in 12/28/20. Patient will call office if needed prior to next encounter.  SDOH (Social Determinants of Health) assessments and interventions performed:     Advanced Directives Status: Not addressed in this encounter.  Care Plan  Allergies  Allergen Reactions   Penicillins Anaphylaxis    Has patient had a PCN reaction causing immediate rash, facial/tongue/throat swelling, SOB or lightheadedness with hypotension: Yes Has patient had a PCN reaction causing severe rash involving mucus membranes or skin necrosis: No Has patient had a PCN reaction that required hospitalization No Has patient had a PCN reaction occurring within the last 10 years: Yes If all of the above answers are "NO", then may proceed with Cephalosporin use.    Tylenol [Acetaminophen] Anaphylaxis, Itching and Hives    Ears and throat itch    Morphine And Related Other (See Comments)    Depression    Nickel Itching   Pepcid [Famotidine] Nausea And Vomiting   Shrimp [Shellfish Allergy] Hives   Tape     irritation   Tetanus Toxoids Other (See Comments)    Swelling at injection site and fever   Wheat Bran    Corn-Containing Products     Has hay fever and swelling in fingers.   Gabapentin Other (See Comments)    Makes her feel "spacy" off balance, like she is coming out of anesthesia.     Outpatient Encounter Medications as of 10/26/2020  Medication Sig   albuterol (PROVENTIL HFA;VENTOLIN HFA) 108 (90 Base) MCG/ACT inhaler Inhale 2 puffs into the lungs every 4 (four) hours as needed for wheezing or shortness of breath.   alendronate (FOSAMAX) 70 MG tablet Take 1 tablet (70 mg total) by mouth every 7 (seven) days. Take with a full glass of water on an empty stomach.   azelastine (ASTELIN) 0.1 % nasal spray Place 2 sprays into both nostrils 2 (two) times daily as  needed for rhinitis. Use in each nostril as directed   busPIRone (BUSPAR) 10 MG tablet Take 1 tablet (10 mg total) by mouth 3 (three) times daily. (Patient not taking: Reported on 06/01/2020)   Cholecalciferol (VITAMIN D3) 50 MCG (2000 UT) TABS Take 1 tablet by mouth daily.   EPINEPHrine (EPIPEN 2-PAK) 0.3 mg/0.3  mL IJ SOAJ injection Inject 0.3 mLs (0.3 mg total) into the muscle once for 1 dose.   escitalopram (LEXAPRO) 5 MG tablet Take 1 tablet (5 mg total) by mouth daily.   esomeprazole (NEXIUM) 40 MG capsule Take 1 capsule (40 mg total) by mouth daily.   ezetimibe (ZETIA) 10 MG tablet Take 1 tablet (10 mg total) by mouth daily.   fluticasone (FLONASE) 50 MCG/ACT nasal spray PLACE 2 SPRAYS INTO BOTH NOSTRILS DAILY   meclizine (ANTIVERT) 25 MG tablet Take 1 tablet (25 mg total) by mouth 3 (three) times daily as needed for dizziness.   montelukast (SINGULAIR) 10 MG tablet Take 1 tablet (10 mg total) by mouth at bedtime.   Multiple Vitamins-Minerals (PRESERVISION AREDS 2 PO) Take by mouth.   pravastatin (PRAVACHOL) 20 MG tablet Take 1 tablet (20 mg total) by mouth daily.   tiZANidine (ZANAFLEX) 4 MG tablet TAKE ONE TABLET FOUR TIMES DAILY AS NEEDED FOR MUSCLE SPASM   traMADol (ULTRAM) 50 MG tablet Take 50 mg by mouth every 8 (eight) hours as needed.   Vitamin D, Ergocalciferol, (DRISDOL) 1.25 MG (50000 UNIT) CAPS capsule Take 1 capsule (50,000 Units total) by mouth every 7 (seven) days. (Patient not taking: Reported on 06/01/2020)   No facility-administered encounter medications on file as of 10/26/2020.    Patient Active Problem List   Diagnosis Date Noted   Positive colorectal cancer screening using Cologuard test 04/19/2020   Osteopenia 11/21/2019   Encounter for screening mammogram for malignant neoplasm of breast 11/21/2019   Hyperlipidemia 11/21/2019   GERD (gastroesophageal reflux disease) 11/21/2019   Joint pain 08/22/2019   Macular degeneration, dry 01/30/2018   Chronic lumbar radicular pain (Left) 04/18/2016   Vitamin D deficiency 03/07/2016   Musculoskeletal pain 02/11/2016   Neurogenic pain 02/11/2016   Chronic pain syndrome 02/10/2016   Long term current use of opiate analgesic 02/10/2016   Long term prescription opiate use 02/10/2016   Opiate use (80-140 MME/Day) 02/10/2016    Chronic shoulder pain (Location of Primary Source of Pain) (Bilateral) (L>R) 02/10/2016   Chronic hip pain (Location of Secondary source of pain) (Bilateral) (L>R) 02/10/2016   Chronic sacroiliac joint pain (Location of Tertiary source of pain) (Bilateral) (L>R) 02/10/2016   Chronic low back pain (Bilateral) (L>R) 02/10/2016   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   History of lumbar fusion 02/10/2016   Chronic lower extremity pain (Bilateral) (L>R) 02/10/2016   Chronic knee pain (Bilateral) (L>R) 02/10/2016   Chronic elbow pain (Bilateral) (L>R) 02/10/2016   Chronic hand pain (Bilateral) (L>R) 02/10/2016   History of fibrocystic disease of breast 01/08/2013   Dry eyes, bilateral 02/06/2012   Hypoglycemia 02/06/2012   Hypertension 02/06/2012   Chronic fatigue syndrome 02/06/2012   Osteoporosis 02/06/2012   Vaginal atrophy 02/06/2012   Spasm of back muscles 01/31/2011   Chronic neck pain 01/31/2011   Depression 01/13/2011   History of seasonal allergies 01/13/2011   Situational disturbance 01/13/2011   Fibromyalgia 01/12/2011    Conditions to be addressed/monitored: Depression and Stress ; Mental Health Concerns   Care Plan : General Social Work (Adult)  Updates made by April Chesterfield, LCSW since  10/29/2020 12:00 AM     Problem: Quality of Life (General Plan of Care)      Long-Range Goal: Quality of Life Maintained   Start Date: 07/14/2020  This Visit's Progress: On track  Recent Progress: On track  Priority: Medium  Note:   Timeframe:  Long-Range Goal Priority:  Medium Start Date: 07/14/20            Expected End Date: 01/07/21                     Follow Up Date - 12/28/20  Current Barriers:  Chronic Mental Health needs related to pain and major depressive disorder Financial constraints related to managing health care expenses Limited social support ADL IADL limitations Mental Health Concerns  Limited access to caregiver Suicidal Ideation/Homicidal Ideation:  No  Clinical Social Work Goal(s):  Over the next 120 days, patient will work with SW bi-monthly by telephone or in person to reduce or manage symptoms related to chronic pain and depression Over the next 120 days, patient will work with SW to address concerns related to ongoing stress, isolation, pain and depressive symptoms  Interventions: Patient interviewed and appropriate assessments performed: brief mental health assessment Patient endorses an increase in stress triggered by recent medical bills that she received in May for $50 and June for $100. Patient reports that insurance company identifies CCM services and all providers at [facility] (except Dr. Robyne Askew) as out of network, resulting in bills Per patient and chart review, she has addressed billing concerns with the Engineer, building services and Front Desk representative, Rachell Patient was successful in identifying that stress negatively impacts pain management and anxiety management CCM LCSW provided support and validation to patient's feelings. She is aware of healthy coping skills CCM LCSW collaborated with Field seismologist about patient's concerns. Both are aware of billing, has notified Maurine Minister, Surveyor, quantity, and are active in resolving the matter Patient reports that she has been successful with pain management with the assistance of specialist. She has an upcoming appointment scheduled next week with [facility] at the [facility]. She receives injections on her SI joints. This treatment assists with pain relief for approximately 2.5 months  CCM LCSW inquired about coping skills. Patient reports that pain increases with inclement weather. Since it has been raining often, pt has been intentional on taking her time to prevent falls. She utilizes a cane and rollator walker to improve balance Patient continues to drive and will utilize friends, who are a strong support system for  her, to assist if needed. Family resides in [place] but they talk 2-3 times a week via phone Patient enjoys reading and spending time with her dog, who keeps her active and is emotionally supportive. She is compliant with medications prescribed by provider Patient denies any current resource needs LCSW used empathetic and active and reflective listening, validated patient's feelings/concerns, and provided emotional support Discussed plans with patient for ongoing care management follow up and provided patient with direct contact information for care management team  Patient Self Care Activities:  Attend all scheduled appointments with providers Continue to comply with medication management Continue utilizing strategies discussed to assist with management of symptoms       Christa See, MSW, [email or web address] Phone [phone] 11:12 AM

## 2020-10-29 NOTE — Patient Instructions (Signed)
Visit Information   Goals Addressed               This Visit's Progress     Patient Stated     "I need help with managing my chronic pain and depression." (pt-stated)   On track     Patient Self Care Activities:  Attend all scheduled appointments with providers Continue to comply with medication management Continue utilizing strategies discussed to assist with management of symptoms      Other     SW-Track and Manage My Symptoms-Depression   On track     Timeframe:  Long-Range Goal Priority:  Medium Start Date: 07/14/20            Expected End Date: 01/07/21                     Follow Up Date - 12/28/20   Patient Self Care Activities:  Attend all scheduled appointments with providers Continue to comply with medication management Continue utilizing strategies discussed to assist with management of symptoms        Patient verbalizes understanding of instructions provided today and agrees to view in [place].   Telephone follow up appointment with care management team member scheduled for:12/28/20  Christa See, MSW, [email or web address] Phone [phone] 11:15 AM

## 2020-11-11 ENCOUNTER — Telehealth: Payer: Self-pay

## 2020-12-02 DIAGNOSIS — M461 Sacroiliitis, not elsewhere classified: Secondary | ICD-10-CM | POA: Diagnosis not present

## 2020-12-27 DIAGNOSIS — G894 Chronic pain syndrome: Secondary | ICD-10-CM | POA: Diagnosis not present

## 2020-12-27 DIAGNOSIS — M461 Sacroiliitis, not elsewhere classified: Secondary | ICD-10-CM | POA: Diagnosis not present

## 2020-12-27 DIAGNOSIS — M797 Fibromyalgia: Secondary | ICD-10-CM | POA: Diagnosis not present

## 2020-12-28 ENCOUNTER — Telehealth: Payer: Self-pay

## 2020-12-28 ENCOUNTER — Telehealth: Payer: Self-pay | Admitting: Licensed Clinical Social Worker

## 2020-12-28 NOTE — Telephone Encounter (Signed)
    Clinical Social Work  Care Management   Phone Outreach    12/28/2020 Name: April Price MRN: [id number] DOB: 02-09-1956  April Price is a 65 y.o. year old female who is a primary care patient of Jearld Fenton, NP .   Reason for referral: Mental Health Counseling and Resources.    F/U phone call today to assess needs, progress and barriers with care plan goals.   Telephone outreach was unsuccessful. A HIPPA compliant phone message was left for the patient providing contact information and requesting a return call.   Plan:Will route chart to Care Guide to see if patient would like to reschedule phone appointment   Review of patient status, including review of consultants reports, relevant laboratory and other test results, and collaboration with appropriate care team members and the patient's provider was performed as part of comprehensive patient evaluation and provision of care management services.    Christa See, MSW, [facility] Care Management [email or web address] Phone [phone] 2:12 PM

## 2021-01-18 ENCOUNTER — Telehealth: Payer: Self-pay

## 2021-01-18 NOTE — Telephone Encounter (Signed)
Patient declined rescheduling due to billing issues

## 2021-01-18 NOTE — Chronic Care Management (AMB) (Signed)
  Care Management   Note  01/18/2021 Name: April Price MRN: [id number] DOB: 01-Jul-1955  April Price is a 65 y.o. year old female who is a primary care patient of Garnette Gunner, Coralie Keens, NP and is actively engaged with the care management team. I reached out to [name] by phone today to assist with re-scheduling a follow up visit with the Licensed Clinical Social Worker  Follow up plan: Patient declines further follow up and engagement by the care management team. Appropriate care team members and provider have been notified via electronic communication.   Noreene Larsson, [place], [place], [place] [address] Direct Dial: [phone] [email or web address]: [email or web address]

## 2021-01-24 ENCOUNTER — Other Ambulatory Visit: Payer: Self-pay | Admitting: Family Medicine

## 2021-01-24 DIAGNOSIS — K219 Gastro-esophageal reflux disease without esophagitis: Secondary | ICD-10-CM

## 2021-01-24 DIAGNOSIS — G894 Chronic pain syndrome: Secondary | ICD-10-CM

## 2021-01-24 NOTE — Telephone Encounter (Signed)
Requested medications are due for refill today.  yes  Requested medications are on the active medications list.  yes  Last refill. 07/28/2020  Future visit scheduled.   yes  Notes to clinic.  Medication not delegated.

## 2021-01-24 NOTE — Telephone Encounter (Signed)
Requested Prescriptions  Pending Prescriptions Disp Refills  . tiZANidine (ZANAFLEX) 4 MG tablet [Pharmacy Med Name: TIZANIDINE HCL 4 MG TAB] 360 tablet 1    Sig: TAKE ONE TABLET FOUR TIMES DAILY AS NEEDED FOR MUSCLE SPASM     Not Delegated - Cardiovascular:  Alpha-2 Agonists - tizanidine Failed - 01/24/2021  2:35 PM      Failed - This refill cannot be delegated      Failed - Valid encounter within last 6 months    Recent Outpatient Visits          8 months ago Mild episode of recurrent major depressive disorder ([place])   [facility] Olin Hauser, DO   9 months ago Other depression   [place], [place]   1 year ago Essential hypertension   [facility], Lupita Raider, FNP   1 year ago Arthralgia, unspecified joint   [facility], Lupita Raider, [place]   1 year ago Essential hypertension   [place], FNP      Future Appointments            In 4 months [facility], [place]            . esomeprazole ([place]) 40 MG capsule [Pharmacy Med Name: ESOMEPRAZOLE MAGNESIUM 40 MG CAP] 90 capsule 0    Sig: TAKE [place]     Gastroenterology: Proton Pump Inhibitors Passed - 01/24/2021  2:35 PM      Passed - Valid encounter within last 12 months    Recent Outpatient Visits          8 months ago Mild episode of recurrent major depressive disorder [facility])   [facility] Olin Hauser, DO   9 months ago Other depression   [place], [place]   1 year ago Essential hypertension   [place], FNP   1 year ago Arthralgia, unspecified joint   [facility], Lupita Raider, [place]   1 year ago Essential hypertension   [place], FNP      Future Appointments            In 4 months [facility],  [facility]

## 2021-01-25 ENCOUNTER — Other Ambulatory Visit: Payer: Self-pay | Admitting: Internal Medicine

## 2021-01-25 DIAGNOSIS — K219 Gastro-esophageal reflux disease without esophagitis: Secondary | ICD-10-CM

## 2021-01-25 NOTE — Telephone Encounter (Signed)
Requested Prescriptions  Pending Prescriptions Disp Refills  . esomeprazole (NEXIUM) 40 MG capsule [Pharmacy Med Name: ESOMEPRAZOLE MAGNESIUM 40 MG CAP] 90 capsule 0    Sig: TAKE 1 CAPSULE BY MOUTH EVERY DAY     Gastroenterology: Proton Pump Inhibitors Passed - 01/25/2021 10:15 AM      Passed - Valid encounter within last 12 months    Recent Outpatient Visits          8 months ago Mild episode of recurrent major depressive disorder [facility])   [facility] Olin Hauser, DO   9 months ago Other depression   [place], [place]   1 year ago Essential hypertension   [place], FNP   1 year ago Arthralgia, unspecified joint   [facility], Lupita Raider, [place]   1 year ago Essential hypertension   [place], FNP      Future Appointments            In 4 months [facility], [facility]

## 2021-01-26 DIAGNOSIS — H353131 Nonexudative age-related macular degeneration, bilateral, early dry stage: Secondary | ICD-10-CM | POA: Diagnosis not present

## 2021-01-27 ENCOUNTER — Ambulatory Visit (INDEPENDENT_AMBULATORY_CARE_PROVIDER_SITE_OTHER): Payer: PPO | Admitting: Internal Medicine

## 2021-01-27 ENCOUNTER — Encounter: Payer: Self-pay | Admitting: Internal Medicine

## 2021-01-27 ENCOUNTER — Other Ambulatory Visit: Payer: Self-pay

## 2021-01-27 VITALS — BP 165/71 | HR 78 | Temp 97.6°F | Resp 17 | Ht 64.0 in | Wt 158.6 lb

## 2021-01-27 DIAGNOSIS — J3089 Other allergic rhinitis: Secondary | ICD-10-CM | POA: Diagnosis not present

## 2021-01-27 DIAGNOSIS — Z6827 Body mass index (BMI) 27.0-27.9, adult: Secondary | ICD-10-CM | POA: Insufficient documentation

## 2021-01-27 DIAGNOSIS — Z6826 Body mass index (BMI) 26.0-26.9, adult: Secondary | ICD-10-CM | POA: Insufficient documentation

## 2021-01-27 DIAGNOSIS — Z889 Allergy status to unspecified drugs, medicaments and biological substances status: Secondary | ICD-10-CM | POA: Diagnosis not present

## 2021-01-27 DIAGNOSIS — M810 Age-related osteoporosis without current pathological fracture: Secondary | ICD-10-CM | POA: Diagnosis not present

## 2021-01-27 DIAGNOSIS — K219 Gastro-esophageal reflux disease without esophagitis: Secondary | ICD-10-CM | POA: Diagnosis not present

## 2021-01-27 DIAGNOSIS — G9332 Myalgic encephalomyelitis/chronic fatigue syndrome: Secondary | ICD-10-CM | POA: Diagnosis not present

## 2021-01-27 DIAGNOSIS — Z789 Other specified health status: Secondary | ICD-10-CM

## 2021-01-27 DIAGNOSIS — D751 Secondary polycythemia: Secondary | ICD-10-CM | POA: Diagnosis not present

## 2021-01-27 DIAGNOSIS — I10 Essential (primary) hypertension: Secondary | ICD-10-CM | POA: Diagnosis not present

## 2021-01-27 DIAGNOSIS — F321 Major depressive disorder, single episode, moderate: Secondary | ICD-10-CM

## 2021-01-27 DIAGNOSIS — Z23 Encounter for immunization: Secondary | ICD-10-CM | POA: Diagnosis not present

## 2021-01-27 DIAGNOSIS — G894 Chronic pain syndrome: Secondary | ICD-10-CM | POA: Diagnosis not present

## 2021-01-27 DIAGNOSIS — N1831 Chronic kidney disease, stage 3a: Secondary | ICD-10-CM | POA: Diagnosis not present

## 2021-01-27 DIAGNOSIS — M797 Fibromyalgia: Secondary | ICD-10-CM | POA: Diagnosis not present

## 2021-01-27 DIAGNOSIS — E782 Mixed hyperlipidemia: Secondary | ICD-10-CM | POA: Diagnosis not present

## 2021-01-27 DIAGNOSIS — E663 Overweight: Secondary | ICD-10-CM | POA: Insufficient documentation

## 2021-01-27 HISTORY — DX: Chronic kidney disease, stage 3a: N18.31

## 2021-01-27 MED ORDER — TIZANIDINE HCL 4 MG PO TABS: 4.0000 mg | ORAL_TABLET | Freq: Three times a day (TID) | ORAL | 0 refills | Status: DC | PRN

## 2021-01-27 MED ORDER — AZELASTINE HCL 0.1 % NA SOLN: 2.0000 | Freq: Two times a day (BID) | NASAL | 5 refills | Status: DC | PRN

## 2021-01-27 MED ORDER — AMLODIPINE BESYLATE 2.5 MG PO TABS: 2.5000 mg | ORAL_TABLET | Freq: Every day | ORAL | 0 refills | Status: DC

## 2021-01-27 MED ORDER — FLUTICASONE PROPIONATE 50 MCG/ACT NA SUSP: 2.0000 | Freq: Every day | NASAL | 6 refills | Status: DC

## 2021-01-27 NOTE — Assessment & Plan Note (Signed)
Encourage regular physical activity

## 2021-01-27 NOTE — Assessment & Plan Note (Signed)
She will not take Alendronate,Calcium and Vitamin D due to financial reasons Encourage daily weightbearing exercise

## 2021-01-27 NOTE — Assessment & Plan Note (Signed)
Encouraged weight loss as this can help reduce reflux symptoms Continue Esomeprazole

## 2021-01-27 NOTE — Assessment & Plan Note (Signed)
Uncontrolled off meds Will start Amlodipine 2.5 mg daily Reinforced DASH diet  RTC in 2 weeks for BP check

## 2021-01-27 NOTE — Assessment & Plan Note (Signed)
CBC today Encourage smoking cessation Discussed her increased risk for heart attack, stroke and death

## 2021-01-27 NOTE — Patient Instructions (Signed)

## 2021-01-27 NOTE — Assessment & Plan Note (Signed)
Encourage regular stretching Encourage exercise for weight loss as this can help reduce joint pain Continue Tramadol and Tizanidine as prescribed by pain management

## 2021-01-27 NOTE — Assessment & Plan Note (Signed)
Currently not an issue off meds Support offered 

## 2021-01-27 NOTE — Assessment & Plan Note (Signed)
Encouraged exercise for weight loss

## 2021-01-27 NOTE — Assessment & Plan Note (Signed)
Due to myalgias Encourage low-fat diet

## 2021-01-27 NOTE — Assessment & Plan Note (Signed)
C-Met and lipid profile today Encouraged her to consume a low-fat diet She declines statin therapy secondary to myalgias

## 2021-01-27 NOTE — Assessment & Plan Note (Signed)
C-Met today 

## 2021-01-27 NOTE — Progress Notes (Signed)
Subjective:    Patient ID: April Price, female    DOB: 1955-06-25, 65 y.o.   MRN: [id number]  HPI  Pt presents to the clinic today for follow up of chronic conditions. She is establishing care with me today, transferring care from [facility], NP.  HTN: Her BP today is 165/71. She is not taking any antihypertensives at this time. ECG from 05/2016 reviewed.  GERD: Triggered by medications. She denies breakthrough on Esomeprazole. There is no upper GI on file.  Anxiety and Depression: Chronic, but she in not taking Escilatopram or Buspirone at this time because she does not feel like she needs it. She is not currently seeing a therapist. She denies SI/HI.  Chronic Pain/Chronic Fatigue/Fibromyalgia: She is taking Tramadol and Tizanidine as prescribed by pain management.  HLD: Her last LDL was 174, triglycerides 147, 11/2019. She is not taking Pravastatin and Ezetimibe because it "makes me hurt". She does not consume a low fat diet.   Osteoporosis: She is not taking Alendronate  or Calcium and Vit D OTC because she can not afford it.  She does not get weight bearing exercise daily. Bone density from 03/2020 reviewed.  CKD 3: Her last creatinine was 1.17, GFR 50, 11/2019. She is not on an ACEI/ARB. She does not follow with nephrology.  Polycythemia: Her last H/H was 17.5/52.5, 07/2019. She does smoke. She does not follow with hematology.  Review of Systems     Past Medical History:  Diagnosis Date   Allergy    Anxiety    Arthritis    Asthma    Cataract    surgery both eyes in 2011   Chronic fatigue    Depression    Depression screen    Diffuse cystic mastopathy    Fibromyalgia    Fibromyalgia muscle pain    GERD (gastroesophageal reflux disease)    Heart murmur    History of kidney stones    Hyperlipidemia 11/21/2019   Hypertension    driven by stress and pain    Current Outpatient Medications  Medication Sig Dispense Refill   albuterol (PROVENTIL HFA;VENTOLIN  HFA) 108 (90 Base) MCG/ACT inhaler Inhale 2 puffs into the lungs every 4 (four) hours as needed for wheezing or shortness of breath. 3 Inhaler 1   alendronate (FOSAMAX) 70 MG tablet Take 1 tablet (70 mg total) by mouth every 7 (seven) days. Take with a full glass of water on an empty stomach. 4 tablet 11   azelastine (ASTELIN) 0.1 % nasal spray Place 2 sprays into both nostrils 2 (two) times daily as needed for rhinitis. Use in each nostril as directed 30 mL 5   busPIRone (BUSPAR) 10 MG tablet Take 1 tablet (10 mg total) by mouth 3 (three) times daily. (Patient not taking: Reported on 06/01/2020) 270 tablet 1   Cholecalciferol (VITAMIN D3) 50 MCG (2000 UT) TABS Take 1 tablet by mouth daily.     EPINEPHrine (EPIPEN 2-PAK) 0.3 mg/0.3 mL IJ SOAJ injection Inject 0.3 mLs (0.3 mg total) into the muscle once for 1 dose. 0.3 mL 0   escitalopram (LEXAPRO) 5 MG tablet Take 1 tablet (5 mg total) by mouth daily. 90 tablet 1   esomeprazole (NEXIUM) 40 MG capsule TAKE 1 CAPSULE BY MOUTH EVERY DAY 90 capsule 0   ezetimibe (ZETIA) 10 MG tablet Take 1 tablet (10 mg total) by mouth daily. 90 tablet 3   fluticasone (FLONASE) 50 MCG/ACT nasal spray PLACE 2 SPRAYS INTO BOTH NOSTRILS DAILY 16 g  6   meclizine (ANTIVERT) 25 MG tablet Take 1 tablet (25 mg total) by mouth 3 (three) times daily as needed for dizziness. 30 tablet 2   montelukast (SINGULAIR) 10 MG tablet Take 1 tablet (10 mg total) by mouth at bedtime. 90 tablet 3   Multiple Vitamins-Minerals (PRESERVISION AREDS 2 PO) Take by mouth.     pravastatin (PRAVACHOL) 20 MG tablet Take 1 tablet (20 mg total) by mouth daily. 90 tablet 1   tiZANidine (ZANAFLEX) 4 MG tablet TAKE ONE TABLET FOUR TIMES DAILY AS NEEDED FOR MUSCLE SPASM 360 tablet 1   traMADol (ULTRAM) 50 MG tablet Take 50 mg by mouth every 8 (eight) hours as needed.     Vitamin D, Ergocalciferol, (DRISDOL) 1.25 MG (50000 UNIT) CAPS capsule Take 1 capsule (50,000 Units total) by mouth every 7 (seven) days.  (Patient not taking: Reported on 06/01/2020) 12 capsule 0   No current facility-administered medications for this visit.    Allergies  Allergen Reactions   Penicillins Anaphylaxis    Has patient had a PCN reaction causing immediate rash, facial/tongue/throat swelling, SOB or lightheadedness with hypotension: Yes Has patient had a PCN reaction causing severe rash involving mucus membranes or skin necrosis: No Has patient had a PCN reaction that required hospitalization No Has patient had a PCN reaction occurring within the last 10 years: Yes If all of the above answers are "NO", then may proceed with Cephalosporin use.    Tylenol [Acetaminophen] Anaphylaxis, Itching and Hives    Ears and throat itch    Morphine And Related Other (See Comments)    Depression    Nickel Itching   Pepcid [Famotidine] Nausea And Vomiting   Shrimp [Shellfish Allergy] Hives   Tape     irritation   Tetanus Toxoids Other (See Comments)    Swelling at injection site and fever   Wheat Bran    Corn-Containing Products     Has hay fever and swelling in fingers.   Gabapentin Other (See Comments)    Makes her feel "spacy" off balance, like she is coming out of anesthesia.     Family History  Problem Relation Age of Onset   Heart disease Mother    Arthritis Mother    Vision loss Mother    Heart disease Father    Stroke Father    Vascular Disease Sister    Heart disease Brother     Social History   Socioeconomic History   Marital status: Single    Spouse name: Not on file   Number of children: Not on file   Years of education: Not on file   Highest education level: Some college, no degree  Occupational History   Not on file  Tobacco Use   Smoking status: Every Day    Packs/day: 0.50    Years: 40.00    Pack years: 20.00    Types: Cigarettes   Smokeless tobacco: Never  Vaping Use   Vaping Use: Never used  Substance and Sexual Activity   Alcohol use: Never   Drug use: Never   Sexual  activity: Not Currently    Birth control/protection: None  Other Topics Concern   Not on file  Social History Narrative   Not on file   Social Determinants of Health   Financial Resource Strain: Medium Risk   Difficulty of Paying Living Expenses: Somewhat hard  Food Insecurity: No Food Insecurity   Worried About Running Out of Food in the Last Year: Never true  Ran Out of Food in the Last Year: Never true  Transportation Needs: No Transportation Needs   Lack of Transportation (Medical): No   Lack of Transportation (Non-Medical): No  Physical Activity: Inactive   Days of Exercise per Week: 0 days   Minutes of Exercise per Session: 0 min  Stress: No Stress Concern Present   Feeling of Stress : Not at all  Social Connections: Not on file  Intimate Partner Violence: Not on file     Constitutional: Pt reports fatigue. Denies fever, malaise, headache or abrupt weight changes.  HEENT: Denies eye pain, eye redness, ear pain, ringing in the ears, wax buildup, runny nose, nasal congestion, bloody nose, or sore throat. Respiratory: Denies difficulty breathing, shortness of breath, cough or sputum production.   Cardiovascular: Denies chest pain, chest tightness, palpitations or swelling in the hands or feet.  Gastrointestinal: Denies abdominal pain, bloating, constipation, diarrhea or blood in the stool.  GU: Denies urgency, frequency, pain with urination, burning sensation, blood in urine, odor or discharge. Musculoskeletal: Pt reports chronic joint and muscle pain. Denies decrease in range of motion, difficulty with gait, or joint swelling.  Skin: Denies redness, rashes, lesions or ulcercations.  Neurological: Denies dizziness, difficulty with memory, difficulty with speech or problems with balance and coordination.  Psych: Pt has a history of depression. Denies anxiety,SI/HI.  No other specific complaints in a complete review of systems (except as listed in HPI above).  Objective:    Physical Exam BP (!) 165/71 (BP Location: Right Arm, Patient Position: Sitting, Cuff Size: Normal)   Pulse 78   Temp 97.6 F (36.4 C) (Temporal)   Resp 17   Ht 5\' 4"  (1.626 m)   Wt 158 lb 9.6 oz (71.9 kg)   SpO2 98%   BMI 27.22 kg/m   Wt Readings from Last 3 Encounters:  06/01/20 170 lb (77.1 kg)  05/19/20 173 lb (78.5 kg)  04/19/20 170 lb 6.4 oz (77.3 kg)    General: Appears her stated age, overweight, chronically ill-appearing in NAD. Skin: Warm, dry and intact.  HEENT: Head: normal shape and size; EOMs intact;  Cardiovascular: Normal rate and rhythm. S1,S2 noted.  No murmur, rubs or gallops noted. No JVD or BLE edema. No carotid bruits noted. Pulmonary/Chest: Normal effort and positive vesicular breath sounds. No respiratory distress. No wheezes, rales or ronchi noted.  Abdomen: Normal bowel sounds.  Musculoskeletal: Gait slow and steady without device. Neurological: Alert and oriented.  Psychiatric: Mood and affect mildly flat. Behavior is normal. Judgment and thought content normal.    BMET    Component Value Date/Time   NA 138 11/21/2019 0829   K 5.0 11/21/2019 0829   CL 106 11/21/2019 0829   CO2 22 11/21/2019 0829   GLUCOSE 90 11/21/2019 0829   BUN 20 11/21/2019 0829   CREATININE 1.17 (H) 11/21/2019 0829   CALCIUM 9.7 11/21/2019 0829   GFRNONAA 50 (L) 11/21/2019 0829   GFRAA 57 (L) 11/21/2019 0829    Lipid Panel     Component Value Date/Time   CHOL 259 (H) 11/21/2019 0829   TRIG 147 11/21/2019 0829   HDL 56 11/21/2019 0829   CHOLHDL 4.6 11/21/2019 0829   LDLCALC 174 (H) 11/21/2019 0829    CBC    Component Value Date/Time   WBC 9.0 07/25/2019 0902   RBC 5.78 (H) 07/25/2019 0902   HGB 17.5 (H) 07/25/2019 0902   HCT 52.5 (H) 07/25/2019 0902   PLT 292 07/25/2019 0902   MCV 90.8  07/25/2019 0902   MCH 30.3 07/25/2019 0902   MCHC 33.3 07/25/2019 0902   RDW 13.4 07/25/2019 0902   LYMPHSABS 1,908 07/25/2019 0902   MONOABS 0.6 06/01/2016 0901   EOSABS  126 07/25/2019 0902   BASOSABS 63 07/25/2019 0902    Hgb A1C No results found for: HGBA1C         Assessment & Plan:    Webb Silversmith, NP This visit occurred during the SARS-CoV-2 public health emergency.  Safety protocols were in place, including screening questions prior to the visit, additional usage of staff PPE, and extensive cleaning of exam room while observing appropriate contact time as indicated for disinfecting solutions.

## 2021-01-28 LAB — CBC WITH DIFFERENTIAL/PLATELET
Absolute Monocytes: 619 cells/uL (ref 200–950)
Basophils Absolute: 73 cells/uL (ref 0–200)
Basophils Relative: 0.8 %
Eosinophils Absolute: 137 cells/uL (ref 15–500)
Eosinophils Relative: 1.5 %
HCT: 49.7 % — ABNORMAL HIGH (ref 35.0–45.0)
Hemoglobin: 17.1 g/dL — ABNORMAL HIGH (ref 11.7–15.5)
Lymphs Abs: 2539 cells/uL (ref 850–3900)
MCH: 32.3 pg (ref 27.0–33.0)
MCHC: 34.4 g/dL (ref 32.0–36.0)
MCV: 93.8 fL (ref 80.0–100.0)
MPV: 10.4 fL (ref 7.5–12.5)
Monocytes Relative: 6.8 %
Neutro Abs: 5733 cells/uL (ref 1500–7800)
Neutrophils Relative %: 63 %
Platelets: 272 10*3/uL (ref 140–400)
RBC: 5.3 10*6/uL — ABNORMAL HIGH (ref 3.80–5.10)
RDW: 12.2 % (ref 11.0–15.0)
Total Lymphocyte: 27.9 %
WBC: 9.1 10*3/uL (ref 3.8–10.8)

## 2021-01-28 LAB — COMPLETE METABOLIC PANEL WITH GFR
AG Ratio: 1.3 (calc) (ref 1.0–2.5)
ALT: 7 U/L (ref 6–29)
AST: 9 U/L — ABNORMAL LOW (ref 10–35)
Albumin: 3.8 g/dL (ref 3.6–5.1)
Alkaline phosphatase (APISO): 70 U/L (ref 37–153)
BUN/Creatinine Ratio: 14 (calc) (ref 6–22)
BUN: 20 mg/dL (ref 7–25)
CO2: 21 mmol/L (ref 20–32)
Calcium: 9.6 mg/dL (ref 8.6–10.4)
Chloride: 107 mmol/L (ref 98–110)
Creat: 1.39 mg/dL — ABNORMAL HIGH (ref 0.50–1.05)
Globulin: 3 g/dL (calc) (ref 1.9–3.7)
Glucose, Bld: 92 mg/dL (ref 65–139)
Potassium: 4.6 mmol/L (ref 3.5–5.3)
Sodium: 138 mmol/L (ref 135–146)
Total Bilirubin: 0.4 mg/dL (ref 0.2–1.2)
Total Protein: 6.8 g/dL (ref 6.1–8.1)
eGFR: 42 mL/min/{1.73_m2} — ABNORMAL LOW (ref >=60)

## 2021-01-28 LAB — LIPID PANEL
Cholesterol: 243 mg/dL — ABNORMAL HIGH (ref <200)
HDL: 50 mg/dL (ref >=50)
LDL Cholesterol (Calc): 170 mg/dL (calc) — ABNORMAL HIGH
Non-HDL Cholesterol (Calc): 193 mg/dL (calc) — ABNORMAL HIGH (ref <130)
Total CHOL/HDL Ratio: 4.9 (calc) (ref <5.0)
Triglycerides: 107 mg/dL (ref <150)

## 2021-01-28 LAB — HEMOGLOBIN A1C
Hgb A1c MFr Bld: 5 % of total Hgb (ref <5.7)
Mean Plasma Glucose: 97 mg/dL
eAG (mmol/L): 5.4 mmol/L

## 2021-01-28 MED ORDER — LOVASTATIN 10 MG PO TABS: 10.0000 mg | ORAL_TABLET | ORAL | 0 refills | Status: DC

## 2021-01-28 NOTE — Addendum Note (Signed)
Addended by: Jearld Fenton on: 01/28/2021 01:19 PM   Modules accepted: Orders

## 2021-02-03 ENCOUNTER — Telehealth: Payer: Self-pay

## 2021-02-03 NOTE — Telephone Encounter (Signed)
  Care Management   Follow Up Note   02/03/2021 Name: April Price MRN: [id number] DOB: 10-Dec-1955   Referred by: Jearld Fenton, NP Reason for referral : Care Coordination (RNCM: Follow up for Chronic Disease Management and Care Coordination Needs )   An unsuccessful telephone outreach was attempted today. The patient was referred to the case management team for assistance with care management and care coordination.   Follow Up Plan: A HIPPA compliant phone message was left for the patient providing contact information and requesting a return call.   Noreene Larsson RN, MSN, [place] Mobile: [phone]

## 2021-02-10 ENCOUNTER — Other Ambulatory Visit: Payer: Self-pay

## 2021-02-10 ENCOUNTER — Encounter: Payer: Self-pay | Admitting: Internal Medicine

## 2021-02-10 ENCOUNTER — Ambulatory Visit (INDEPENDENT_AMBULATORY_CARE_PROVIDER_SITE_OTHER): Payer: PPO | Admitting: Internal Medicine

## 2021-02-10 DIAGNOSIS — Z6826 Body mass index (BMI) 26.0-26.9, adult: Secondary | ICD-10-CM

## 2021-02-10 DIAGNOSIS — I10 Essential (primary) hypertension: Secondary | ICD-10-CM | POA: Diagnosis not present

## 2021-02-10 DIAGNOSIS — E663 Overweight: Secondary | ICD-10-CM

## 2021-02-10 NOTE — Assessment & Plan Note (Signed)
Encouraged diet and exercise for weight loss ?

## 2021-02-10 NOTE — Assessment & Plan Note (Signed)
Improved but not at goal Manual repeat 142/66 Will increase Amlodipine to 2.5 mg 2 times daily Reinforced DASH diet and exercise for weight loss  RTC in 2 weeks for nurse visit BP check

## 2021-02-10 NOTE — Progress Notes (Signed)
Subjective:    Patient ID: April Price, female    DOB: 01/09/1956, 65 y.o.   MRN: [id number]  HPI  Pt presents to the clinic today for 2 week follow up HTN. She was started on Amlodipine 2.5 mg daily at her last visit. She has been taking the medication as prescribed. Her BP today is 15/61. ECG from 05/2016 reviewed.  Review of Systems     Past Medical History:  Diagnosis Date   Allergy    Anxiety    Arthritis    Asthma    Cataract    surgery both eyes in 2011   Chronic fatigue    Depression    Depression screen    Diffuse cystic mastopathy    Fibromyalgia    Fibromyalgia muscle pain    GERD (gastroesophageal reflux disease)    Heart murmur    History of kidney stones    Hyperlipidemia 11/21/2019   Hypertension    driven by stress and pain   Stage 3a chronic kidney disease ([place]) 01/27/2021    Current Outpatient Medications  Medication Sig Dispense Refill   albuterol (PROVENTIL HFA;VENTOLIN HFA) 108 (90 Base) MCG/ACT inhaler Inhale 2 puffs into the lungs every 4 (four) hours as needed for wheezing or shortness of breath. 3 Inhaler 1   amLODipine (NORVASC) 2.5 MG tablet Take 1 tablet (2.5 mg total) by mouth daily. 30 tablet 0   azelastine (ASTELIN) 0.1 % nasal spray Place 2 sprays into both nostrils 2 (two) times daily as needed for rhinitis. Use in each nostril as directed 30 mL 5   EPINEPHrine (EPIPEN 2-PAK) 0.3 mg/0.3 mL IJ SOAJ injection Inject 0.3 mLs (0.3 mg total) into the muscle once for 1 dose. 0.3 mL 0   esomeprazole (NEXIUM) 40 MG capsule TAKE 1 CAPSULE BY MOUTH EVERY DAY 90 capsule 0   fluticasone (FLONASE) 50 MCG/ACT nasal spray Place 2 sprays into both nostrils daily. 16 g 6   lovastatin (MEVACOR) 10 MG tablet Take 1 tablet (10 mg total) by mouth every other day. 30 tablet 0   Multiple Vitamins-Minerals (PRESERVISION AREDS 2 PO) Take by mouth.     tiZANidine (ZANAFLEX) 4 MG tablet Take 1 tablet (4 mg total) by mouth every 8 (eight) hours as needed  for muscle spasms. TAKE ONE TABLET FOUR TIMES DAILY AS NEEDED FOR MUSCLE SPASM 270 tablet 0   traMADol (ULTRAM) 50 MG tablet Take 50 mg by mouth every 8 (eight) hours as needed.     No current facility-administered medications for this visit.    Allergies  Allergen Reactions   Penicillins Anaphylaxis    Has patient had a PCN reaction causing immediate rash, facial/tongue/throat swelling, SOB or lightheadedness with hypotension: Yes Has patient had a PCN reaction causing severe rash involving mucus membranes or skin necrosis: No Has patient had a PCN reaction that required hospitalization No Has patient had a PCN reaction occurring within the last 10 years: Yes If all of the above answers are "NO", then may proceed with Cephalosporin use.    Tylenol [Acetaminophen] Anaphylaxis, Itching and Hives    Ears and throat itch    Armoracia Rusticana Ext (Horseradish)    Morphine And Related Other (See Comments)    Depression    Nickel Itching   Pepcid [Famotidine] Nausea And Vomiting   Shrimp [Shellfish Allergy] Hives   Tape     irritation   Tetanus Toxoids Other (See Comments)    Swelling at injection site and fever  Wheat Bran    Corn-Containing Products     Has hay fever and swelling in fingers.   Gabapentin Other (See Comments)    Makes her feel "spacy" off balance, like she is coming out of anesthesia.     Family History  Problem Relation Age of Onset   Heart disease Mother    Arthritis Mother    Vision loss Mother    Heart disease Father    Stroke Father    Vascular Disease Sister    Heart disease Brother     Social History   Socioeconomic History   Marital status: Single    Spouse name: Not on file   Number of children: Not on file   Years of education: Not on file   Highest education level: Some college, no degree  Occupational History   Not on file  Tobacco Use   Smoking status: Every Day    Packs/day: 0.50    Years: 40.00    Pack years: 20.00    Types:  Cigarettes   Smokeless tobacco: Never  Vaping Use   Vaping Use: Never used  Substance and Sexual Activity   Alcohol use: Never   Drug use: Never   Sexual activity: Not Currently    Birth control/protection: None  Other Topics Concern   Not on file  Social History Narrative   Not on file   Social Determinants of Health   Financial Resource Strain: Medium Risk   Difficulty of Paying Living Expenses: Somewhat hard  Food Insecurity: No Food Insecurity   Worried About Charity fundraiser in the Last Year: Never true   Ran Out of Food in the Last Year: Never true  Transportation Needs: No Transportation Needs   Lack of Transportation (Medical): No   Lack of Transportation (Non-Medical): No  Physical Activity: Inactive   Days of Exercise per Week: 0 days   Minutes of Exercise per Session: 0 min  Stress: No Stress Concern Present   Feeling of Stress : Not at all  Social Connections: Not on file  Intimate Partner Violence: Not on file     Constitutional: Pt reports fatigue. Denies fever, malaise, headache or abrupt weight changes.  Respiratory: Denies difficulty breathing, shortness of breath, cough or sputum production.   Cardiovascular: Denies chest pain, chest tightness, palpitations or swelling in the hands or feet.  Neurological: Denies dizziness, difficulty with memory, difficulty with speech or problems with balance and coordination.    No other specific complaints in a complete review of systems (except as listed in HPI above).  Objective:   Physical Exam  BP (!) 150/61 (BP Location: Right Arm, Patient Position: Sitting, Cuff Size: Normal)   Pulse 77   Temp (!) 97.5 F (36.4 C) (Temporal)   Resp 17   Ht 5\' 4"  (1.626 m)   Wt 156 lb 9.6 oz (71 kg)   SpO2 99%   BMI 26.88 kg/m   Wt Readings from Last 3 Encounters:  01/27/21 158 lb 9.6 oz (71.9 kg)  06/01/20 170 lb (77.1 kg)  05/19/20 173 lb (78.5 kg)    General: Appears her stated age, obese, in  NAD. Cardiovascular: Normal rate and rhythm. S1,S2 noted.  No murmur, rubs or gallops noted. No JVD or BLE edema.  Pulmonary/Chest: Normal effort and positive vesicular breath sounds. No respiratory distress. No wheezes, rales or ronchi noted.  Neurological: Alert and oriented.    BMET    Component Value Date/Time   NA 138 01/27/2021 1409  K 4.6 01/27/2021 1409   CL 107 01/27/2021 1409   CO2 21 01/27/2021 1409   GLUCOSE 92 01/27/2021 1409   BUN 20 01/27/2021 1409   CREATININE 1.39 (H) 01/27/2021 1409   CALCIUM 9.6 01/27/2021 1409   GFRNONAA 50 (L) 11/21/2019 0829   GFRAA 57 (L) 11/21/2019 0829    Lipid Panel     Component Value Date/Time   CHOL 243 (H) 01/27/2021 1409   TRIG 107 01/27/2021 1409   HDL 50 01/27/2021 1409   CHOLHDL 4.9 01/27/2021 1409   LDLCALC 170 (H) 01/27/2021 1409    CBC    Component Value Date/Time   WBC 9.1 01/27/2021 1409   RBC 5.30 (H) 01/27/2021 1409   HGB 17.1 (H) 01/27/2021 1409   HCT 49.7 (H) 01/27/2021 1409   PLT 272 01/27/2021 1409   MCV 93.8 01/27/2021 1409   MCH 32.3 01/27/2021 1409   MCHC 34.4 01/27/2021 1409   RDW 12.2 01/27/2021 1409   LYMPHSABS 2,539 01/27/2021 1409   MONOABS 0.6 06/01/2016 0901   EOSABS 137 01/27/2021 1409   BASOSABS 73 01/27/2021 1409    Hgb A1C Lab Results  Component Value Date   HGBA1C 5.0 01/27/2021           Assessment & Plan:   Webb Silversmith, NP This visit occurred during the SARS-CoV-2 public health emergency.  Safety protocols were in place, including screening questions prior to the visit, additional usage of staff PPE, and extensive cleaning of exam room while observing appropriate contact time as indicated for disinfecting solutions.

## 2021-02-10 NOTE — Patient Instructions (Signed)

## 2021-02-16 ENCOUNTER — Other Ambulatory Visit: Payer: Self-pay | Admitting: Internal Medicine

## 2021-02-16 DIAGNOSIS — Z1231 Encounter for screening mammogram for malignant neoplasm of breast: Secondary | ICD-10-CM

## 2021-02-25 ENCOUNTER — Other Ambulatory Visit: Payer: Self-pay

## 2021-02-25 ENCOUNTER — Ambulatory Visit: Payer: PPO

## 2021-02-25 VITALS — BP 122/68

## 2021-02-25 DIAGNOSIS — I10 Essential (primary) hypertension: Secondary | ICD-10-CM

## 2021-02-25 NOTE — Progress Notes (Signed)
Does she want to continue Lisinopril or Amlodipine?

## 2021-02-27 MED ORDER — LISINOPRIL 5 MG PO TABS: 5.0000 mg | ORAL_TABLET | Freq: Every day | ORAL | 1 refills | Status: DC

## 2021-03-02 ENCOUNTER — Telehealth: Payer: Self-pay | Admitting: *Deleted

## 2021-03-02 NOTE — Chronic Care Management (AMB) (Signed)
  Chronic Care Management   Note  03/02/2021 Name: April Price MRN: [id number] DOB: 03/16/1956  April Price is a 65 y.o. year old female who is a primary care patient of Jearld Fenton, NP. I reached out to [name] by phone today in response to a referral sent by Ms. [place] PCP.  Ms. Bourget was given information about Chronic Care Management services today including:  CCM service includes personalized support from designated clinical staff supervised by her physician, including individualized plan of care and coordination with other care providers 24/7 contact phone numbers for assistance for urgent and routine care needs. Service will only be billed when office clinical staff spend 20 minutes or more in a month to coordinate care. Only one practitioner may furnish and bill the service in a calendar month. The patient may stop CCM services at any time (effective at the end of the month) by phone call to the office staff. The patient is responsible for co-pay (up to 20% after annual deductible is met) if co-pay is required by the individual health plan.   Patient agreed to services and verbal consent obtained.   Follow up plan: Telephone appointment with care management team member scheduled for:03/09/21 with SW and 03/24/21 with RNCM.  [facility]  Direct Dial: [phone]

## 2021-03-08 DIAGNOSIS — M461 Sacroiliitis, not elsewhere classified: Secondary | ICD-10-CM | POA: Diagnosis not present

## 2021-03-09 ENCOUNTER — Ambulatory Visit (INDEPENDENT_AMBULATORY_CARE_PROVIDER_SITE_OTHER): Payer: PPO | Admitting: Licensed Clinical Social Worker

## 2021-03-09 DIAGNOSIS — M797 Fibromyalgia: Secondary | ICD-10-CM

## 2021-03-09 DIAGNOSIS — M810 Age-related osteoporosis without current pathological fracture: Secondary | ICD-10-CM

## 2021-03-09 DIAGNOSIS — I10 Essential (primary) hypertension: Secondary | ICD-10-CM

## 2021-03-09 DIAGNOSIS — G9332 Myalgic encephalomyelitis/chronic fatigue syndrome: Secondary | ICD-10-CM

## 2021-03-09 DIAGNOSIS — G894 Chronic pain syndrome: Secondary | ICD-10-CM

## 2021-03-09 DIAGNOSIS — G8929 Other chronic pain: Secondary | ICD-10-CM

## 2021-03-09 DIAGNOSIS — M542 Cervicalgia: Secondary | ICD-10-CM

## 2021-03-09 DIAGNOSIS — F321 Major depressive disorder, single episode, moderate: Secondary | ICD-10-CM

## 2021-03-11 NOTE — Patient Instructions (Signed)
Visit Information  Thank you for taking time to visit with me today. Please don't hesitate to contact me if I can be of assistance to you before our next scheduled telephone appointment.  Following are the goals we discussed today:  Patient Self Care Activities:  Attend all scheduled appointments with providers Continue to comply with medication management Continue utilizing strategies discussed to assist with management of symptoms  Our next appointment is by telephone on 05/17/21  Please call the care guide team at [phone] if you need to cancel or reschedule your appointment.   If you are experiencing a Mental Health or [place] or need someone to talk to, please call the Suicide and Crisis Lifeline: 988 call 911   Patient verbalizes understanding of instructions provided today and agrees to view in [place].   Christa See, MSW, [facility] Care Management [email or web address] Phone [phone] 11:16 AM

## 2021-03-11 NOTE — Chronic Care Management (AMB) (Signed)
Chronic Care Management    Clinical Social Work Note  03/11/2021 Name: April Price MRN: [id number] DOB: [birth year]  April Price is a 65 y.o. year old female who is a primary care patient of Jearld Fenton, NP. The CCM team was consulted to assist the patient with chronic disease management and/or care coordination needs related to: Mental Health Counseling and Resources.   Engaged with patient by telephone for follow up visit in response to provider referral for social work chronic care management and care coordination services.   Consent to Services:  The patient was given information about Chronic Care Management services, agreed to services, and gave verbal consent prior to initiation of services.  Please see initial visit note for detailed documentation.   Patient agreed to services and consent obtained.   Consent to Services:  The patient was given information about Care Management services, agreed to services, and gave verbal consent prior to initiation of services.  Please see initial visit note for detailed documentation.   Patient agreed to services today and consent obtained.  Engaged with patient by phone in response to provider referral for social work care coordination services:  Assessment/Interventions:  Patient continues to maintain positive progress with care plan goals. Strategies to decrease fall risk and promote safety discussed. CCM LCSW completed care guide referral for assistance with repairing a rail outside patient's home to promote safety. See Care Plan below for interventions and patient self-care activities.  Recent life changes or stressors: Management of health conditions  Recommendation: Patient may benefit from, and is in agreement work with LCSW to address care coordination needs and will continue to work with the clinical team to address health care and disease management related needs.   Follow up Plan: Patient would like continued  follow-up from CCM LCSW.  per patient's request will follow up in 05/17/21.  Will call office if needed prior to next encounter.    SDOH (Social Determinants of Health) assessments and interventions performed:    Advanced Directives Status: Not addressed in this encounter.  CCM Care Plan  Allergies  Allergen Reactions   Penicillins Anaphylaxis    Has patient had a PCN reaction causing immediate rash, facial/tongue/throat swelling, SOB or lightheadedness with hypotension: Yes Has patient had a PCN reaction causing severe rash involving mucus membranes or skin necrosis: No Has patient had a PCN reaction that required hospitalization No Has patient had a PCN reaction occurring within the last 10 years: Yes If all of the above answers are "NO", then may proceed with Cephalosporin use.    Tylenol [Acetaminophen] Anaphylaxis, Itching and Hives    Ears and throat itch    Armoracia Rusticana Ext (Horseradish)    Morphine And Related Other (See Comments)    Depression    Nickel Itching   Pepcid [Famotidine] Nausea And Vomiting   Shrimp [Shellfish Allergy] Hives   Tape     irritation   Tetanus Toxoids Other (See Comments)    Swelling at injection site and fever   Wheat Bran    Corn-Containing Products     Has hay fever and swelling in fingers.   Gabapentin Other (See Comments)    Makes her feel "spacy" off balance, like she is coming out of anesthesia.     Outpatient Encounter Medications as of 03/09/2021  Medication Sig   albuterol (PROVENTIL HFA;VENTOLIN HFA) 108 (90 Base) MCG/ACT inhaler Inhale 2 puffs into the lungs every 4 (four) hours as needed for wheezing or shortness of  breath.   amLODipine (NORVASC) 2.5 MG tablet Take 1 tablet (2.5 mg total) by mouth 2 (two) times daily.   azelastine (ASTELIN) 0.1 % nasal spray Place 2 sprays into both nostrils 2 (two) times daily as needed for rhinitis. Use in each nostril as directed   EPINEPHrine (EPIPEN 2-PAK) 0.3 mg/0.3 mL IJ SOAJ  injection Inject 0.3 mLs (0.3 mg total) into the muscle once for 1 dose.   esomeprazole (NEXIUM) 40 MG capsule TAKE 1 CAPSULE BY MOUTH EVERY DAY   fluticasone (FLONASE) 50 MCG/ACT nasal spray Place 2 sprays into both nostrils daily.   lisinopril (ZESTRIL) 5 MG tablet Take 1 tablet (5 mg total) by mouth daily.   lovastatin (MEVACOR) 10 MG tablet Take 1 tablet (10 mg total) by mouth every other day.   Multiple Vitamins-Minerals (PRESERVISION AREDS 2 PO) Take by mouth.   tiZANidine (ZANAFLEX) 4 MG tablet Take 1 tablet (4 mg total) by mouth every 8 (eight) hours as needed for muscle spasms. TAKE ONE TABLET FOUR TIMES DAILY AS NEEDED FOR MUSCLE SPASM   traMADol (ULTRAM) 50 MG tablet Take 50 mg by mouth every 8 (eight) hours as needed.   No facility-administered encounter medications on file as of 03/09/2021.    Patient Active Problem List   Diagnosis Date Noted   Polycythemia 01/27/2021   Statin intolerance 01/27/2021   Stage 3a chronic kidney disease ([place]) 01/27/2021   Overweight with body mass index (BMI) of 26 to 26.9 in adult 01/27/2021   Hyperlipidemia 11/21/2019   GERD (gastroesophageal reflux disease) 11/21/2019   Chronic lumbar radicular pain (Left) 04/18/2016   Neurogenic pain 02/11/2016   Chronic pain syndrome 02/10/2016   Long term prescription opiate use 02/10/2016   Opiate use (80-140 MME/Day) 02/10/2016   Chronic shoulder pain (Location of Primary Source of Pain) (Bilateral) (L>R) 02/10/2016   Chronic hip pain (Location of Secondary source of pain) (Bilateral) (L>R) 02/10/2016   Chronic sacroiliac joint pain (Location of Tertiary source of pain) (Bilateral) (L>R) 02/10/2016   Chronic low back pain (Bilateral) (L>R) 02/10/2016   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   Chronic lower extremity pain (Bilateral) (L>R) 02/10/2016   Chronic knee pain (Bilateral) (L>R) 02/10/2016   Chronic elbow pain (Bilateral) (L>R) 02/10/2016   Chronic hand pain (Bilateral) (L>R) 02/10/2016    Hypertension 02/06/2012   Chronic fatigue syndrome 02/06/2012   Osteoporosis 02/06/2012   Chronic neck pain 01/31/2011   Depression 01/13/2011   Fibromyalgia 01/12/2011    Conditions to be addressed/monitored: Depression;  Chronic Pain  Care Plan : General Social Work (Adult)  Updates made by Rebekah Chesterfield, LCSW since 03/11/2021 12:00 AM     Problem: Quality of Life (General Plan of Care)      Long-Range Goal: Quality of Life Maintained   Start Date: 07/14/2020  This Visit's Progress: On track  Recent Progress: On track  Priority: Medium  Note:   Timeframe:  Long-Range Goal Priority:  Medium Start Date: 07/14/20            Expected End Date: 06/07/21                     Follow Up Date - 05/17/21  Current Barriers:  Chronic Mental Health needs related to pain and major depressive disorder Financial constraints related to managing health care expenses Limited social support ADL IADL limitations Mental Health Concerns  Limited access to caregiver Suicidal Ideation/Homicidal Ideation: No Clinical Social Work Goal(s):  Over the next 120 days,  patient will work with SW bi-monthly by telephone or in person to reduce or manage symptoms related to chronic pain and depression Over the next 120 days, patient will work with SW to address concerns related to ongoing stress, isolation, pain and depressive symptoms Interventions: Patient interviewed and appropriate assessments performed: brief mental health assessment Patient endorses an increase in stress triggered by recent medical bills that she received in May for $50 and June for $100. Patient reports that insurance company identifies CCM services and all providers at [facility] (except Dr. Robyne Askew) as out of network, resulting in bills 11/30: This has been resolved Patient was successful in identifying that stress negatively impacts pain management and anxiety management CCM LCSW provided support and validation to patient's feelings.  She is aware of healthy coping skills Patient endorses elevated blood pressure readings at [facility] appts, noting they are stable at other appts with pain clinic and at home. She reports compliance with medications and checks every other day with the monitor provided by CCM RN Patient reports that she has been successful with pain management with the assistance of specialist. She has an upcoming appointment scheduled next week with [facility] at the [facility]. She receives injections on her SI joints. This treatment assists with pain relief for approximately 2.5 months 11/30: Patient shared she is managing pain "pretty good" She was provided injections and was excited about not requiring an injection on her right side due to decreased pain. Patient's next appt is scheduled 03/28/21 She has good insight of how pain negatively impacts depression symptoms. Patient successfully identified trends in pain to assist with plan to cope/manage. CCM LCSW inquired about coping skills. Patient reports that pain increases with inclement weather. Since it has been raining often, pt has been intentional on taking her time to prevent falls. She utilizes a cane and rollator walker to improve balance 11/30: Patient continues to have difficulty with balance, stating, she fell two weeks ago (did not sustain any injuries) Strategies to decrease fall risk and promote safety discussed (Patient has 3 house phones, a cell phone, cane and walker in the home) Patient continues to drive and will utilize friends, who are a strong support system for her, to assist if needed. Family resides in [place] but they talk 2-3 times a week via phone Patient enjoys reading and spending time with her dog, who keeps her active and is emotionally supportive. She is compliant with medications prescribed by provider CCM LCSW completed care guide referral for assistance with repairing a rail outside patient's home to promote safety CCM LCSW  reviewed recent phq9 and gad7 scores Reviewed upcoming appointments LCSW used empathetic and active and reflective listening, validated patient's feelings/concerns, and provided emotional support Discussed plans with patient for ongoing care management follow up and provided patient with direct contact information for care management team Patient Self Care Activities:  Attend all scheduled appointments with providers Continue to comply with medication management Continue utilizing strategies discussed to assist with management of symptoms       Christa See, MSW, [email or web address] Phone [phone] 11:14 AM

## 2021-03-18 ENCOUNTER — Other Ambulatory Visit: Payer: Self-pay

## 2021-03-18 ENCOUNTER — Ambulatory Visit
Admission: RE | Admit: 2021-03-18 | Discharge: 2021-03-18 | Disposition: A | Discharge status: Home / Self Care | Payer: PPO | Source: Ambulatory Visit | Attending: Internal Medicine | Admitting: Internal Medicine

## 2021-03-18 DIAGNOSIS — Z1231 Encounter for screening mammogram for malignant neoplasm of breast: Secondary | ICD-10-CM | POA: Diagnosis not present

## 2021-03-24 ENCOUNTER — Ambulatory Visit (INDEPENDENT_AMBULATORY_CARE_PROVIDER_SITE_OTHER): Payer: PPO

## 2021-03-24 ENCOUNTER — Telehealth: Payer: PPO | Admitting: General Practice

## 2021-03-24 DIAGNOSIS — F321 Major depressive disorder, single episode, moderate: Secondary | ICD-10-CM

## 2021-03-24 DIAGNOSIS — F419 Anxiety disorder, unspecified: Secondary | ICD-10-CM

## 2021-03-24 DIAGNOSIS — E782 Mixed hyperlipidemia: Secondary | ICD-10-CM

## 2021-03-24 DIAGNOSIS — I10 Essential (primary) hypertension: Secondary | ICD-10-CM

## 2021-03-24 DIAGNOSIS — M797 Fibromyalgia: Secondary | ICD-10-CM

## 2021-03-24 DIAGNOSIS — G894 Chronic pain syndrome: Secondary | ICD-10-CM

## 2021-03-24 DIAGNOSIS — M25559 Pain in unspecified hip: Secondary | ICD-10-CM

## 2021-03-24 NOTE — Chronic Care Management (AMB) (Signed)
Chronic Care Management   CCM RN Visit Note  03/24/2021 Name: April Price MRN: [id number] DOB: Mar 22, 1956  Subjective: April Price is a 65 y.o. year old female who is a primary care patient of Jearld Fenton, NP. The care management team was consulted for assistance with disease management and care coordination needs.    Engaged with patient by telephone for follow up visit in response to provider referral for case management and/or care coordination services.   Consent to Services:  The patient was given information about Chronic Care Management services, agreed to services, and gave verbal consent prior to initiation of services.  Please see initial visit note for detailed documentation.   Patient agreed to services and verbal consent obtained.   Assessment: Review of patient past medical history, allergies, medications, health status, including review of consultants reports, laboratory and other test data, was performed as part of comprehensive evaluation and provision of chronic care management services.   SDOH (Social Determinants of Health) assessments and interventions performed:  SDOH Interventions    Flowsheet Row Most Recent Value  SDOH Interventions   Food Insecurity Interventions Intervention Not Indicated  Financial Strain Interventions Other (Comment)  [needs assistance with a bill]  Housing Interventions Intervention Not Indicated  Intimate Partner Violence Interventions Intervention Not Indicated  Physical Activity Interventions Other (Comments)  [is active when pain is tolerable]  Stress Interventions Intervention Not Indicated  Social Connections Interventions Intervention Not Indicated  Transportation Interventions Intervention Not Indicated        CCM Care Plan  Allergies  Allergen Reactions   Penicillins Anaphylaxis    Has patient had a PCN reaction causing immediate rash, facial/tongue/throat swelling, SOB or lightheadedness with  hypotension: Yes Has patient had a PCN reaction causing severe rash involving mucus membranes or skin necrosis: No Has patient had a PCN reaction that required hospitalization No Has patient had a PCN reaction occurring within the last 10 years: Yes If all of the above answers are "NO", then may proceed with Cephalosporin use.    Tylenol [Acetaminophen] Anaphylaxis, Itching and Hives    Ears and throat itch    Armoracia Rusticana Ext (Horseradish)    Morphine And Related Other (See Comments)    Depression    Nickel Itching   Pepcid [Famotidine] Nausea And Vomiting   Shrimp [Shellfish Allergy] Hives   Tape     irritation   Tetanus Toxoids Other (See Comments)    Swelling at injection site and fever   Wheat Bran    Corn-Containing Products     Has hay fever and swelling in fingers.   Gabapentin Other (See Comments)    Makes her feel "spacy" off balance, like she is coming out of anesthesia.     Outpatient Encounter Medications as of 03/24/2021  Medication Sig   albuterol (PROVENTIL HFA;VENTOLIN HFA) 108 (90 Base) MCG/ACT inhaler Inhale 2 puffs into the lungs every 4 (four) hours as needed for wheezing or shortness of breath.   amLODipine (NORVASC) 2.5 MG tablet Take 1 tablet (2.5 mg total) by mouth 2 (two) times daily.   azelastine (ASTELIN) 0.1 % nasal spray Place 2 sprays into both nostrils 2 (two) times daily as needed for rhinitis. Use in each nostril as directed   EPINEPHrine (EPIPEN 2-PAK) 0.3 mg/0.3 mL IJ SOAJ injection Inject 0.3 mLs (0.3 mg total) into the muscle once for 1 dose.   esomeprazole (NEXIUM) 40 MG capsule TAKE 1 CAPSULE BY MOUTH EVERY DAY   fluticasone (  FLONASE) 50 MCG/ACT nasal spray Place 2 sprays into both nostrils daily.   lisinopril (ZESTRIL) 5 MG tablet Take 1 tablet (5 mg total) by mouth daily.   lovastatin (MEVACOR) 10 MG tablet Take 1 tablet (10 mg total) by mouth every other day.   Multiple Vitamins-Minerals (PRESERVISION AREDS 2 PO) Take by mouth.    tiZANidine (ZANAFLEX) 4 MG tablet Take 1 tablet (4 mg total) by mouth every 8 (eight) hours as needed for muscle spasms. TAKE ONE TABLET FOUR TIMES DAILY AS NEEDED FOR MUSCLE SPASM   traMADol (ULTRAM) 50 MG tablet Take 50 mg by mouth every 8 (eight) hours as needed.   No facility-administered encounter medications on file as of 03/24/2021.    Patient Active Problem List   Diagnosis Date Noted   Polycythemia 01/27/2021   Statin intolerance 01/27/2021   Stage 3a chronic kidney disease ([place]) 01/27/2021   Overweight with body mass index (BMI) of 26 to 26.9 in adult 01/27/2021   Hyperlipidemia 11/21/2019   GERD (gastroesophageal reflux disease) 11/21/2019   Chronic lumbar radicular pain (Left) 04/18/2016   Neurogenic pain 02/11/2016   Chronic pain syndrome 02/10/2016   Long term prescription opiate use 02/10/2016   Opiate use (80-140 MME/Day) 02/10/2016   Chronic shoulder pain (Location of Primary Source of Pain) (Bilateral) (L>R) 02/10/2016   Chronic hip pain (Location of Secondary source of pain) (Bilateral) (L>R) 02/10/2016   Chronic sacroiliac joint pain (Location of Tertiary source of pain) (Bilateral) (L>R) 02/10/2016   Chronic low back pain (Bilateral) (L>R) 02/10/2016   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   Chronic lower extremity pain (Bilateral) (L>R) 02/10/2016   Chronic knee pain (Bilateral) (L>R) 02/10/2016   Chronic elbow pain (Bilateral) (L>R) 02/10/2016   Chronic hand pain (Bilateral) (L>R) 02/10/2016   Hypertension 02/06/2012   Chronic fatigue syndrome 02/06/2012   Osteoporosis 02/06/2012   Chronic neck pain 01/31/2011   Depression 01/13/2011   Fibromyalgia 01/12/2011    Conditions to be addressed/monitored:HTN, HLD, Anxiety, Depression, and chronic pain   Care Plan : RNCM: Depression (Adult)  Updates made by Vanita Ingles, RN since 03/24/2021 12:00 AM  Completed 03/24/2021   Problem: RNCM: Depression Identification (Depression) and Anxiety Resolved  03/24/2021  Priority: Medium     Long-Range Goal: RNCM: Depressive Symptoms Identified Completed 03/24/2021  Note:   Current Barriers: Resolving, duplicate goal Knowledge Deficits related to resources to help with effective management of depression and anxiety Chronic Disease Management support and education needs related to management of depression and anxiety  Lacks caregiver support.  Unable to independently manage depression and anxiety Lacks social connections Does not contact provider office for questions/concerns  Nurse Case Manager Clinical Goal(s):  Over the next 120 days, patient will verbalize understanding of plan for effective management of depression and anxiety Over the next 120 days, patient will [facility] with [facility], CCM team and pcp to address needs related to depression and anxiety Over the next 120 days, patient will attend all scheduled medical appointments: 05-19-2020 at 1:20 pm  Interventions:  1:1 collaboration with Lorine Bears, Lupita Raider, FNP regarding development and update of comprehensive plan of care as evidenced by provider attestation and co-signature Inter-disciplinary care team collaboration (see longitudinal plan of care) Evaluation of current treatment plan related to depression and anxiety  and patient's adherence to plan as established by provider. 08-05-2020: The patient is doing well currently with managing her depression. When the weather is better she is feeling better. She denies any acute distress and knows to  call for changes in her depression or anxiety.  Advised patient to call the office for changes in mood/anxiety/depression Provided education to patient re: mindfulness, things to work toward to keep mood stable, support and education on effective ways to deal with stressors in life. 08-05-2020: The patient feels better since her pain is being managed. She is enjoying sitting out on her deck when it is warmer. Her family visited recently and this was helpful for  her also.  Reviewed medications with patient and discussed the patient has switched from Cymbalta to Lexapro and can see a positive change already. She feels like it is going to help her a lot. 08-05-2020: The patient is doing well with medications, denies any new concerns.  Reviewed scheduled/upcoming provider appointments including: saw pcp in February, no upcoming appointments but knows to call for changes or concerns.   Patient Goals/Self-Care Activities Over the next 120 days, patient will:  - Patient will self administer medications as prescribed Patient will attend all scheduled provider appointments Patient will call pharmacy for medication refills Patient will attend church or other social activities Patient will continue to perform IADL's independently Patient will call provider office for new concerns or questions Patient will work with BSW to address care coordination needs and will continue to work with the clinical team to address health care and disease management related needs.   - anxiety screen reviewed - depression screen reviewed - medication list reviewed  Follow Up Plan: Telephone follow up appointment with care management team member scheduled for: 10-07-2020 at 59 am        Task: RNCM: Identify Depressive Symptoms and Facilitate Treatment Completed 03/24/2021  Note:   Care Management Activities:    - anxiety screen reviewed - depression screen reviewed - medication list reviewed        Care Plan : RNCM: Chronic Pain (Adult)  Updates made by Vanita Ingles, RN since 03/24/2021 12:00 AM  Completed 03/24/2021   Problem: RNCM: Pain Management Plan (Chronic Pain) Resolved 03/24/2021  Priority: Medium     Long-Range Goal: Pain Management Plan Developed Completed 03/24/2021  Priority: Medium  Note:   Current Barriers: Resolving, duplicate goal Knowledge Deficits related to managing acute/chronic pain Non-adherence to scheduled provider  appointments Non-adherence to prescribed medication regimen Difficulty obtaining medications Chronic Disease Management support and education needs related to chronic pain Unable to independently manage pain and discomfort Lacks social connections Does not contact provider office for questions/concerns  Nurse Case Manager Clinical Goal(s):  Over the next 120 days, patient will verbalize understanding of plan for managing pain Over the next 120 days, patient will meet with RN Care Manager to address needs  Over the next 120 days, patient will attend all scheduled medical appointments: no upcoming pcp appointments, sees pain specialist in May. Over the next  120 days patient will demonstrate use of different relaxation  skills and/or diversional activities to assist with pain reduction (distraction, imagery, relaxation, massage, acupressure, TENS, heat, and cold application Over the next  120  days patient will report pain at a level less than 3 to 4 on a 10-10 rating scale Over the next   120 days patient will use pharmacological and nonpharmacological pain relief strategies Over the next  120 days patient will verbalize acceptable level of pain relief and ability to engage in desired activities Over the next   120 days patient will engage in desired activities without an increase in pain level  Interventions:  Collaboration with Cyndia Skeeters  M, FNP regarding development and update of comprehensive plan of care as evidenced by provider attestation and co-signature Inter-disciplinary care team collaboration (see longitudinal plan of care) - deep breathing, relaxation and mindfulness use promoted - enrollment in pain education program arranged. 08-05-2020: The patient is working with the pain specialist and is getting injections every 3 months. This is helping manage her pain better - motivation and barriers to change assessed and addressed. 08-05-2020: Is happy about the warmer weather and  getting outside sitting on her deck. Denies any new issues at this time.  - mutually acceptable comfort goal set - pain assessed- 08-05-2020: Denies any pain at this time. Is effectively managing pain at this time and is happy with positive changes  - pain treatment goals reviewed. 08-05-2020: Is seeing pain specialist and gets shots every 3 months. Will see again in May Evaluation of current treatment plan related to fibromyalgia and chronic pain and patient's adherence to plan as established by provider. Advised patient to call the office for changes in pain level or intensity  Provided education to patient re: effective pain management  Reviewed medications with patient and discussed compliance  Discussed plans with patient for ongoing care management follow up and provided patient with direct contact information for care management team Allow patient to maintain a diary of pain ratings, timing, precipitating events, medications, treatments, and what works best to relieve pain,  Refer to support groups and self-help groups Educate patient about the use of pharmacological interventions for pain management- antianxiety, antidepressants, NSAIDS, opioid analgesics,  Explain the importance of lifestyle modifications to effective pain management   Patient Goals/Self Care Activities:  Patient verbalizes understanding of plan to manage pain and discomfort related to fibromyalgia Self-administers medications as prescribed Attends all scheduled provider appointments Calls pharmacy for medication refills Calls provider office for new concerns or questions - mutually acceptable comfort goal set - pain assessed - pain management plan developed - pain treatment goals reviewed - patient response to treatment assessed - sharing of pain management plan with teachers and other caregivers encouraged  Follow Up Plan: Telephone follow up appointment with care management team member scheduled for: 10-07-2020 at  1030 am      Task: RNCM: Partner to Develop Chronic Pain Management Plan Completed 03/24/2021  Note:   Care Management Activities:    - mutually acceptable comfort goal set - pain assessed - pain management plan developed - pain treatment goals reviewed - patient response to treatment assessed - sharing of pain management plan with teachers and other caregivers encouraged        Care Plan : RNCM: Hypertension (Adult)  Updates made by Vanita Ingles, RN since 03/24/2021 12:00 AM  Completed 03/24/2021   Problem: RNCM: Hypertension (Hypertension) Resolved 03/24/2021  Priority: Medium     Goal: RNCM: Hypertension Monitored Completed 03/24/2021  Priority: Medium  Note:   Objective: Resolving, duplicate goal Last practice recorded BP readings:  BP Readings from Last 3 Encounters:  05/19/20 (!) 182/80  04/19/20 (!) 141/68  11/21/19 128/74    Most recent eGFR/CrCl: No results found for: EGFR  No components found for: CRCL Current Barriers:  Knowledge Deficits related to basic understanding of hypertension pathophysiology and self care management Knowledge Deficits related to understanding of medications prescribed for management of hypertension Limited Social Support Unable to independently manage HTN Unable to self administer medications as prescribed Lacks social connections Does not contact provider office for questions/concerns Case Manager Clinical Goal(s):  Over the next 120  days, patient will verbalize understanding of plan for hypertension management Over the next 120 days, patient will attend all scheduled medical appointments: No upcoming appointments but knows to call for changes.  Over the next 120 days, patient will demonstrate improved adherence to prescribed treatment plan for hypertension as evidenced by taking all medications as prescribed, monitoring and recording blood pressure as directed, adhering to low sodium/DASH diet Over the next 120 days, patient  will demonstrate improved health management independence as evidenced by checking blood pressure as directed and notifying PCP if SBP>160 or DBP > 90, taking all medications as prescribe, and adhering to a low sodium diet as discussed. Interventions:  Collaboration with Malfi, Lupita Raider, FNP regarding development and update of comprehensive plan of care as evidenced by provider attestation and co-signature Inter-disciplinary care team collaboration (see longitudinal plan of care) Evaluation of current treatment plan related to hypertension self management and patient's adherence to plan as established by provider. 08-05-2020: The patient states she is doing better with her blood pressures. States it is WNL now that she is controlling her pain and the weather is warmer. Will continue to monitor.  Provided education to patient re: stroke prevention, s/s of heart attack and stroke, DASH diet, complications of uncontrolled blood pressure Reviewed medications with patient and discussed importance of compliance. 08-05-2020: States compliance with medications.  Discussed plans with patient for ongoing care management follow up and provided patient with direct contact information for care management team Advised patient, providing education and rationale, to monitor blood pressure daily and record, calling PCP for findings outside established parameters.  Reviewed scheduled/upcoming provider appointments including: No upcoming appointments. Knows to call the office for changes  Patient Goals/Self-Care Activities Over the next 120 days, patient will:  - Self administers medications as prescribed Attends all scheduled provider appointments Calls provider office for new concerns, questions, or BP outside discussed parameters Checks BP and records as discussed Follows a low sodium diet/DASH diet - blood pressure trends reviewed - depression screen reviewed - home or ambulatory blood pressure monitoring  encouraged Follow Up Plan: Telephone follow up appointment with care management team member scheduled for: 10-07-2020 at 1030 am    Task: RNCM: Identify and Monitor Blood Pressure Elevation Completed 03/24/2021  Note:   Care Management Activities:    - blood pressure trends reviewed - depression screen reviewed - home or ambulatory blood pressure monitoring encouraged        Care Plan : RNCM: HLD  Updates made by Vanita Ingles, RN since 03/24/2021 12:00 AM  Completed 03/24/2021   Problem: RNCM: Management of HLD Resolved 03/24/2021  Priority: Medium     Long-Range Goal: RNCM: Management of HLD Completed 03/24/2021  Note:   Current Barriers: Resolving, duplicate goal Poorly controlled hyperlipidemia, complicated by smoking, depression, episodes of Hypotension Current antihyperlipidemic regimen: Zetia 10 mg daily, pravastatin 20 mg daily Most recent lipid panel:  Lab Results  Component Value Date   CHOL 259 (H) 11/21/2019   CHOL 270 (H) 07/25/2019   Lab Results  Component Value Date   HDL 56 11/21/2019   HDL 50 07/25/2019   Lab Results  Component Value Date   LDLCALC 174 (H) 11/21/2019   LDLCALC 193 (H) 07/25/2019   Lab Results  Component Value Date   TRIG 147 11/21/2019   TRIG 122 07/25/2019   Lab Results  Component Value Date   CHOLHDL 4.6 11/21/2019   CHOLHDL 5.4 (H) 07/25/2019   No results found for: LDLDIRECT  ASCVD  risk enhancing conditions: age 8, HTN, current smoker Unable to independently manage HLD Lacks social connections Does not contact provider office for questions/concerns  RN Care Manager Clinical Goal(s):  Over the next 120 days, patient will work with Consulting civil engineer, providers, and care team towards execution of optimized self-health management plan Over the next 120 days, patient will verbalize understanding of plan for effective management of hLD Over the next 120 days, patient will work with RNCM, pcp, and CCM team  to address needs  related to HLD Over the next 120 days, patient will attend all scheduled medical appointments: 05-19-2020 at 1:20 pm  Interventions: Collaboration with Malfi, Lupita Raider, FNP regarding development and update of comprehensive plan of care as evidenced by provider attestation and co-signature Inter-disciplinary care team collaboration (see longitudinal plan of care) Medication review performed; medication list updated in electronic medical record.  Inter-disciplinary care team collaboration (see longitudinal plan of care) Referred to pharmacy team for assistance with HLD medication management Evaluation of current treatment plan related to HLD and patient's adherence to plan as established by provider. 08-05-2020: The patient is doing well with management of HLD. Eating well and managing her blood pressure. Review of the risk of heart attack and stroke, will continue to monitor.  Advised patient to call for changes in condition or new questions Reviewed medications with patient and discussed compliance  Reviewed scheduled/upcoming provider appointments including: No upcoming appointments, the patient knows to call for needs or questions    Patient Goals/Self-Care Activities: Over the next 120 days, patient will:   - call for medicine refill 2 or 3 days before it runs out - call if I am sick and can't take my medicine - keep a list of all the medicines I take; vitamins and herbals too - learn to read medicine labels - use a pillbox to sort medicine - use an alarm clock or phone to remind me to take my medicine - change to whole grain breads, cereal, pasta - drink 6 to 8 glasses of water each day - eat 5 or 6 small meals each day - fill half the plate with nonstarchy vegetables - limit fast food meals to no more than 1 per week - manage portion size - prepare main meal at home 3 to 5 days each week - read food labels for fat, fiber, carbohydrates and portion size - be open to making changes -  I can manage, know and watch for signs of a heart attack - if I have chest pain, call for help - learn about small changes that will make a big difference - learn my personal risk factors - education plan reviewed and/or amended - empathy and reassurance conveyed - family/caregiver participation in learning encouraged - health literacy screen reviewed - patient's preferred learning methods utilized - privacy ensured - questions encouraged - readiness to learn monitored   Follow Up Plan: Telephone follow up appointment with care management team member scheduled for: 10-07-2020 at 1030 am      Task: RNCM: HLD Completed 03/24/2021  Note:   Care Management Activities:    - education plan reviewed and/or amended - empathy and reassurance conveyed - family/caregiver participation in learning encouraged - health literacy screen reviewed - patient's preferred learning methods utilized - privacy ensured - questions encouraged - readiness to learn monitored        Care Plan : RNCM: General Plan of Care (Adult) for Chronic Disease Management and Care Coordination Needs  Updates made by  Vanita Ingles, RN since 03/24/2021 12:00 AM     Problem: RNCM: Development of plan of care for Chronic disease Management (HTM, HLD, Depression, Anxiety, and  Chronic Pain)   Priority: High     Long-Range Goal: RNCM: Effective Management  of plan of care for Chronic disease Management (HTM, HLD, Depression, Anxiety, and  Chronic Pain)   Start Date: 03/24/2021  Expected End Date: 03/24/2022  Priority: High  Note:   Current Barriers:  Knowledge Deficits related to plan of care for management of HTN, HLD, Anxiety with Excessive Worry, and Depression: depressed mood anxiety, and chronic pain- fibromyalgia  Care Coordination needs related to Level of care concerns and no resolution to a bill for CCM services with insurance, pending assistance   Chronic Disease Management support and education needs  related to HTN, HLD, Anxiety with Excessive Worry, and Depression: depressed mood anxiety, and chronic pain  Lacks caregiver support.         RNCM Clinical Goal(s):  Patient will verbalize basic understanding of HTN, HLD, Anxiety, Depression, and chronic pain  disease process and self health management plan as evidenced by keeping appointments, following plan of care, dietary restrictions, and working with the pcp and CCM team to effectively manage health and well being take all medications exactly as prescribed and will call provider for medication related questions as evidenced by compliance and calling for refills before running out of medications    attend all scheduled medical appointments: sees specialist and pcp as directed  as evidenced by keeping appointments and calling the office for needed schedule changes         continue to work with Consulting civil engineer and/or Social Worker to address care management and care coordination needs related to HTN, HLD, Anxiety, Depression, and chronic pain as evidenced by adherence to CM Team Scheduled appointments     demonstrate a decrease in HTN, HLD, Anxiety, Depression, and chronic pain  exacerbations  as evidenced by decreased level of pain, stable blood pressures and no exacerbations with depression and anxiety demonstrate ongoing self health care management ability effectively manage chronic conditions  as evidenced by  working with the CCM team through collaboration with Consulting civil engineer, provider, and care team.   Interventions: 1:1 collaboration with primary care provider regarding development and update of comprehensive plan of care as evidenced by provider attestation and co-signature Inter-disciplinary care team collaboration (see longitudinal plan of care) Evaluation of current treatment plan related to  self management and patient's adherence to plan as established by provider   Anxiety and Depression   (Status: Goal on Track (progressing):  YES.) Long Term Goal  Evaluation of current treatment plan related to Anxiety and Depression, Level of care concerns and Mental Health Concerns  self-management and patient's adherence to plan as established by provider. Discussed plans with patient for ongoing care management follow up and provided patient with direct contact information for care management team Advised patient to call the office for changes in mood, anxiety, or depression ; Provided education to patient re: working with the CCM team to effectively mange mental health and well being ; Reviewed medications with patient and discussed compliance ; Collaborated with Interior and spatial designer and other CCM team support  regarding CCM bill that has not been corrected that the patient continues to get billed for. Secure email sent to management today to assist with resources for helping resolve this matter as it is causing increased anxiety for the patient; Provided patient with anxiety and  depression educational materials related to effective management of depression and anxiety ; Social Work referral for ongoing support and education for effective management of mental health health and well being; Discussed plans with patient for ongoing care management follow up and provided patient with direct contact information for care management team; Advised patient to discuss changes in anxiety and depression  with provider; Screening for signs and symptoms of depression related to chronic disease state;  Assessed social determinant of health barriers;   Hyperlipidemia:  (Status: Goal on Track (progressing): YES.) Long Term Goal  Lab Results  Component Value Date   CHOL 243 (H) 01/27/2021   HDL 50 01/27/2021   LDLCALC 170 (H) 01/27/2021   TRIG 107 01/27/2021   CHOLHDL 4.9 01/27/2021     Medication review performed; medication list updated in electronic medical record.  Provider established cholesterol goals reviewed; Counseled on importance of  regular laboratory monitoring as prescribed; Provided HLD educational materials; Reviewed role and benefits of statin for ASCVD risk reduction; Discussed strategies to manage statin-induced myalgias; Reviewed importance of limiting foods high in cholesterol; Reviewed exercise goals and target of 150 minutes per week;  Hypertension: (Status: Goal on Track (progressing): YES.) Last practice recorded BP readings:  BP Readings from Last 3 Encounters:  02/25/21 122/68  02/10/21 (!) 142/66  01/27/21 (!) 165/71  Most recent eGFR/CrCl:  Lab Results  Component Value Date   EGFR 42 (L) 01/27/2021    No components found for: CRCL  Evaluation of current treatment plan related to hypertension self management and patient's adherence to plan as established by provider. 03-24-2021: States that her blood pressures are more stable now. States last pressure was 120/68. Denies any episodes of hypotension, review of safety and falls prevention as she had a fall back in November without injury. Denies any further falls.    Provided education to patient re: stroke prevention, s/s of heart attack and stroke; Reviewed prescribed diet heart healthy Reviewed medications with patient and discussed importance of compliance;  Discussed plans with patient for ongoing care management follow up and provided patient with direct contact information for care management team; Advised patient, providing education and rationale, to monitor blood pressure daily and record, calling PCP for findings outside established parameters;  Advised patient to discuss blood pressure trends  with provider; Provided education on prescribed diet heart healthy;  Discussed complications of poorly controlled blood pressure such as heart disease, stroke, circulatory complications, vision complications, kidney impairment, sexual dysfunction;   Pain:  (Status: Goal on Track (progressing): YES.) Long Term Goal  Pain assessment performed.  03-24-2021: The patient states that her pain level is at a 7 today and that is due to the change in weather. In the winter she has worse pain. On a good day it is usually at a 2 to 3. Today is not such a good day. She describes her pain as a "pressure like going to explode". States she is taking medications as directed and her muscle spasms are worse and she has been having to take her Tizanidine 41m 4 times a day. She also uses heating pad. Review of safety when using heat application. Sees specialist to get injections to SI joints when needed. Pain is mainly in the right hip today. Does have fibromyalgia. When driving a couple of days she had spasms in her right leg and hip. Review of safety when driving. Denies any acute distress today. Will continue to monitor for changes.  Medications reviewed Reviewed provider established plan for  pain management; Discussed importance of adherence to all scheduled medical appointments; Counseled on the importance of reporting any/all new or changed pain symptoms or management strategies to pain management provider; Advised patient to report to care team affect of pain on daily activities; Discussed use of relaxation techniques and/or diversional activities to assist with pain reduction (distraction, imagery, relaxation, massage, acupressure, TENS, heat, and cold application; Reviewed with patient prescribed pharmacological and nonpharmacological pain relief strategies; Advised patient to discuss unresolved pain, changes in level or intensity of pain  with provider;  Patient Goals/Self-Care Activities: Take medications as prescribed   Attend all scheduled provider appointments Call pharmacy for medication refills 3-7 days in advance of running out of medications Attend church or other social activities Perform all self care activities independently  Perform IADL's (shopping, preparing meals, housekeeping, managing finances) independently Call provider office  for new concerns or questions  Work with the social worker to address care coordination needs and will continue to work with the clinical team to address health care and disease management related needs call the Suicide and Crisis Lifeline: 988 call the [place] National Suicide Prevention Lifeline: 908-142-3615 or TTY: (440) 203-6747 TTY 903 638 8930) to talk to a trained counselor call 1-800-273-TALK (toll free, 24 hour hotline) if experiencing a Mental Health or [place]  check blood pressure 3 times per week choose a place to take my blood pressure (home, clinic or office, retail store) write blood pressure results in a log or diary learn about high blood pressure keep a blood pressure log take blood pressure log to all doctor appointments call doctor for signs and symptoms of high blood pressure develop an action plan for high blood pressure keep all doctor appointments take medications for blood pressure exactly as prescribed begin an exercise program report new symptoms to your doctor eat more whole grains, fruits and vegetables, lean meats and healthy fats - call for medicine refill 2 or 3 days before it runs out - take all medications exactly as prescribed - call doctor with any symptoms you believe are related to your medicine - call doctor when you experience any new symptoms - go to all doctor appointments as scheduled - adhere to prescribed diet: Heart Healthy diet       Plan:Telephone follow up appointment with care management team member scheduled for:  05-26-2021 at [place] am  Noreene Larsson RN, MSN, [place] Mobile: [phone]

## 2021-03-24 NOTE — Patient Instructions (Signed)
Visit Information  Thank you for taking time to visit with me today. Please don't hesitate to contact me if I can be of assistance to you before our next scheduled telephone appointment.  Following are the goals we discussed today:  RNCM Clinical Goal(s):  Patient will verbalize basic understanding of HTN, HLD, Anxiety, Depression, and chronic pain  disease process and self health management plan as evidenced by keeping appointments, following plan of care, dietary restrictions, and working with the pcp and CCM team to effectively manage health and well being take all medications exactly as prescribed and will call provider for medication related questions as evidenced by compliance and calling for refills before running out of medications    attend all scheduled medical appointments: sees specialist and pcp as directed  as evidenced by keeping appointments and calling the office for needed schedule changes         continue to work with Consulting civil engineer and/or Social Worker to address care management and care coordination needs related to HTN, HLD, Anxiety, Depression, and chronic pain as evidenced by adherence to CM Team Scheduled appointments     demonstrate a decrease in HTN, HLD, Anxiety, Depression, and chronic pain  exacerbations  as evidenced by decreased level of pain, stable blood pressures and no exacerbations with depression and anxiety demonstrate ongoing self health care management ability effectively manage chronic conditions  as evidenced by  working with the CCM team through collaboration with Consulting civil engineer, provider, and care team.    Interventions: 1:1 collaboration with primary care provider regarding development and update of comprehensive plan of care as evidenced by provider attestation and co-signature Inter-disciplinary care team collaboration (see longitudinal plan of care) Evaluation of current treatment plan related to  self management and patient's adherence to plan as  established by provider     Anxiety and Depression   (Status: Goal on Track (progressing): YES.) Long Term Goal  Evaluation of current treatment plan related to Anxiety and Depression, Level of care concerns and Mental Health Concerns  self-management and patient's adherence to plan as established by provider. Discussed plans with patient for ongoing care management follow up and provided patient with direct contact information for care management team Advised patient to call the office for changes in mood, anxiety, or depression ; Provided education to patient re: working with the CCM team to effectively mange mental health and well being ; Reviewed medications with patient and discussed compliance ; Collaborated with Interior and spatial designer and other CCM team support  regarding CCM bill that has not been corrected that the patient continues to get billed for. Secure email sent to management today to assist with resources for helping resolve this matter as it is causing increased anxiety for the patient; Provided patient with anxiety and depression educational materials related to effective management of depression and anxiety ; Social Work referral for ongoing support and education for effective management of mental health health and well being; Discussed plans with patient for ongoing care management follow up and provided patient with direct contact information for care management team; Advised patient to discuss changes in anxiety and depression  with provider; Screening for signs and symptoms of depression related to chronic disease state;  Assessed social determinant of health barriers;    Hyperlipidemia:  (Status: Goal on Track (progressing): YES.) Long Term Goal       Lab Results  Component Value Date    CHOL 243 (H) 01/27/2021    HDL 50 01/27/2021  [place] 170 (H) 01/27/2021    TRIG 107 01/27/2021    CHOLHDL 4.9 01/27/2021      Medication review performed; medication list updated in  electronic medical record.  Provider established cholesterol goals reviewed; Counseled on importance of regular laboratory monitoring as prescribed; Provided HLD educational materials; Reviewed role and benefits of statin for ASCVD risk reduction; Discussed strategies to manage statin-induced myalgias; Reviewed importance of limiting foods high in cholesterol; Reviewed exercise goals and target of 150 minutes per week;   Hypertension: (Status: Goal on Track (progressing): YES.) Last practice recorded BP readings:     BP Readings from Last 3 Encounters:  02/25/21 122/68  02/10/21 (!) 142/66  01/27/21 (!) 165/71  Most recent eGFR/CrCl:       Lab Results  Component Value Date    EGFR 42 (L) 01/27/2021    No components found for: CRCL   Evaluation of current treatment plan related to hypertension self management and patient's adherence to plan as established by provider. 03-24-2021: States that her blood pressures are more stable now. States last pressure was 120/68. Denies any episodes of hypotension, review of safety and falls prevention as she had a fall back in November without injury. Denies any further falls.    Provided education to patient re: stroke prevention, s/s of heart attack and stroke; Reviewed prescribed diet heart healthy Reviewed medications with patient and discussed importance of compliance;  Discussed plans with patient for ongoing care management follow up and provided patient with direct contact information for care management team; Advised patient, providing education and rationale, to monitor blood pressure daily and record, calling PCP for findings outside established parameters;  Advised patient to discuss blood pressure trends  with provider; Provided education on prescribed diet heart healthy;  Discussed complications of poorly controlled blood pressure such as heart disease, stroke, circulatory complications, vision complications, kidney impairment, sexual  dysfunction;    Pain:  (Status: Goal on Track (progressing): YES.) Long Term Goal  Pain assessment performed. 03-24-2021: The patient states that her pain level is at a 7 today and that is due to the change in weather. In the winter she has worse pain. On a good day it is usually at a 2 to 3. Today is not such a good day. She describes her pain as a "pressure like going to explode". States she is taking medications as directed and her muscle spasms are worse and she has been having to take her Tizanidine $RemoveBefore'4mg'[id number]$  4 times a day. She also uses heating pad. Review of safety when using heat application. Sees specialist to get injections to SI joints when needed. Pain is mainly in the right hip today. Does have fibromyalgia. When driving a couple of days she had spasms in her right leg and hip. Review of safety when driving. Denies any acute distress today. Will continue to monitor for changes.  Medications reviewed Reviewed provider established plan for pain management; Discussed importance of adherence to all scheduled medical appointments; Counseled on the importance of reporting any/all new or changed pain symptoms or management strategies to pain management provider; Advised patient to report to care team affect of pain on daily activities; Discussed use of relaxation techniques and/or diversional activities to assist with pain reduction (distraction, imagery, relaxation, massage, acupressure, TENS, heat, and cold application; Reviewed with patient prescribed pharmacological and nonpharmacological pain relief strategies; Advised patient to discuss unresolved pain, changes in level or intensity of pain  with provider;   Patient Goals/Self-Care Activities: Take medications as  prescribed   Attend all scheduled provider appointments Call pharmacy for medication refills 3-7 days in advance of running out of medications Attend church or other social activities Perform all self care activities independently   Perform IADL's (shopping, preparing meals, housekeeping, managing finances) independently Call provider office for new concerns or questions  Work with the social worker to address care coordination needs and will continue to work with the clinical team to address health care and disease management related needs call the Suicide and Crisis Lifeline: 988 call the [place] National Suicide Prevention Lifeline: 4311128794 or TTY: 603-467-3798 TTY 701-452-0780) to talk to a trained counselor call 1-800-273-TALK (toll free, 24 hour hotline) if experiencing a Mental Health or [place]  check blood pressure 3 times per week choose a place to take my blood pressure (home, clinic or office, retail store) write blood pressure results in a log or diary learn about high blood pressure keep a blood pressure log take blood pressure log to all doctor appointments call doctor for signs and symptoms of high blood pressure develop an action plan for high blood pressure keep all doctor appointments take medications for blood pressure exactly as prescribed begin an exercise program report new symptoms to your doctor eat more whole grains, fruits and vegetables, lean meats and healthy fats - call for medicine refill 2 or 3 days before it runs out - take all medications exactly as prescribed - call doctor with any symptoms you believe are related to your medicine - call doctor when you experience any new symptoms - go to all doctor appointments as scheduled - adhere to prescribed diet: Heart Healthy diet    Our next appointment is by telephone on 05-26-2021 at 0945 am  Please call the care guide team at [phone] if you need to cancel or reschedule your appointment.   If you are experiencing a Mental Health or [place] or need someone to talk to, please call the Suicide and Crisis Lifeline: 988 call the [place] National Suicide Prevention Lifeline: 302-322-6184 or TTY:  506-257-7109 TTY 443-431-2457) to talk to a trained counselor call 1-800-273-TALK (toll free, 24 hour hotline)   Patient verbalizes understanding of instructions provided today and agrees to view in [place].   Noreene Larsson RN, MSN, [place] Mobile: [phone]

## 2021-03-28 DIAGNOSIS — M797 Fibromyalgia: Secondary | ICD-10-CM | POA: Diagnosis not present

## 2021-03-28 DIAGNOSIS — Z6828 Body mass index (BMI) 28.0-28.9, adult: Secondary | ICD-10-CM | POA: Diagnosis not present

## 2021-03-28 DIAGNOSIS — M461 Sacroiliitis, not elsewhere classified: Secondary | ICD-10-CM | POA: Diagnosis not present

## 2021-03-28 DIAGNOSIS — G894 Chronic pain syndrome: Secondary | ICD-10-CM | POA: Diagnosis not present

## 2021-03-31 ENCOUNTER — Telehealth: Payer: Self-pay

## 2021-03-31 NOTE — Telephone Encounter (Signed)
° °  Telephone encounter was:  Successful.  03/31/2021 Name: April Price MRN: [id number] DOB: 1955-05-19  [name] is a 65 y.o. year old female who is a primary care patient of Jearld Fenton, NP . The community resource team was consulted for assistance with Home Modifications and Fall prevention information.  Care guide performed the following interventions:  Patient is looking for information for home modifications and fall prevention information. I have e-mailed and mailed out necessary information for pt. Enc: Aging Gracefully, Social worker, [place], and Single Family Housing [employer]ans and Grants .  Follow Up Plan:  Pt advised to call back 04/01/2021 to ensure everything was received.  [place] management  [place], [place]  Main Phone: [phone]   E-mail: [email or web address]  Website: [email or web address]

## 2021-04-06 ENCOUNTER — Telehealth: Payer: Self-pay

## 2021-04-06 NOTE — Telephone Encounter (Signed)
° °  Telephone encounter was:  Successful.  04/06/2021 Name: April Price MRN: [id number] DOB: 22-Mar-1956  April Price is a 65 y.o. year old female who is a primary care patient of Jearld Fenton, NP . The community resource team was consulted for assistance with Home Modifications  Care guide performed the following interventions:  Pt advised she has receieved information by e-mail (Enc: Aging Gracefully, [place], [place] and [place], and [place] and [employer]) and at this time she does not have any furher questions or concerns as she is reviewing all information. A hard copy has been sent in mail as well. Pt will call me if and when further assistance is needed .  Follow Up Plan:  No further follow up planned at this time. The patient has been provided with needed resources. Pt will call if further support is needed.  [place] management  [place], [place]  Main Phone: [phone]   E-mail: [name]  Website: [email or web address]

## 2021-04-09 DIAGNOSIS — E782 Mixed hyperlipidemia: Secondary | ICD-10-CM | POA: Diagnosis not present

## 2021-04-09 DIAGNOSIS — I10 Essential (primary) hypertension: Secondary | ICD-10-CM | POA: Diagnosis not present

## 2021-04-09 DIAGNOSIS — F419 Anxiety disorder, unspecified: Secondary | ICD-10-CM | POA: Diagnosis not present

## 2021-04-09 DIAGNOSIS — F321 Major depressive disorder, single episode, moderate: Secondary | ICD-10-CM | POA: Diagnosis not present

## 2021-04-13 ENCOUNTER — Other Ambulatory Visit: Payer: Self-pay | Admitting: Internal Medicine

## 2021-04-13 DIAGNOSIS — G894 Chronic pain syndrome: Secondary | ICD-10-CM

## 2021-04-13 DIAGNOSIS — K219 Gastro-esophageal reflux disease without esophagitis: Secondary | ICD-10-CM

## 2021-04-14 NOTE — Telephone Encounter (Signed)
Requested medications are due for refill today.  yes  Requested medications are on the active medications list.  yes  Last refill. 01/27/2021  Future visit scheduled.   yes  Notes to clinic.  Medication not delegated.    Requested Prescriptions  Pending Prescriptions Disp Refills   tiZANidine (ZANAFLEX) 4 MG tablet [Pharmacy Med Name: TIZANIDINE HCL 4 MG TAB] 270 tablet 0    Sig: TAKE ONE TABLET (4 MG) BY MOUTH EVERY 8 HOURS AS NEEDED FOR NUSCLE SPASMS.     Not Delegated - Cardiovascular:  Alpha-2 Agonists - tizanidine Failed - 04/13/2021 10:24 AM      Failed - This refill cannot be delegated      Passed - Valid encounter within last 6 months    Recent Outpatient Visits           2 months ago Primary hypertension   [place], Coralie Keens, NP   2 months ago Stage 3a chronic kidney disease [facility])   [facility], Coralie Keens, NP   11 months ago Mild episode of recurrent major depressive disorder [facility])   [facility] Olin Hauser, DO   12 months ago Other depression   [place], [place]   1 year ago Essential hypertension   [facility], Lupita Raider, FNP       Future Appointments             In 1 month [facility]             Signed Prescriptions Disp Refills   esomeprazole ([place]) 40 MG capsule 90 capsule 2    Sig: TAKE 1 CAPSULE BY MOUTH EVERY DAY     Gastroenterology: Proton Pump Inhibitors Passed - 04/13/2021 10:24 AM      Passed - Valid encounter within last 12 months    Recent Outpatient Visits           2 months ago Primary hypertension   [place], Coralie Keens, NP   2 months ago Stage 3a chronic kidney disease [facility])   [facility], NP   11 months ago Mild episode of recurrent major depressive disorder [facility])   [facility] Olin Hauser, DO   12  months ago Other depression   [facility], Lupita Raider, FNP   1 year ago Essential hypertension   [place], FNP       Future Appointments             In 1 month [facility]

## 2021-04-14 NOTE — Telephone Encounter (Signed)
Requested Prescriptions  Pending Prescriptions Disp Refills   tiZANidine (ZANAFLEX) 4 MG tablet [Pharmacy Med Name: TIZANIDINE HCL 4 MG TAB] 270 tablet 0    Sig: TAKE ONE TABLET (4 MG) BY MOUTH EVERY 8 HOURS AS NEEDED FOR NUSCLE SPASMS.     Not Delegated - Cardiovascular:  Alpha-2 Agonists - tizanidine Failed - 04/13/2021 10:24 AM      Failed - This refill cannot be delegated      Passed - Valid encounter within last 6 months    Recent Outpatient Visits          2 months ago Primary hypertension   [place], Coralie Keens, NP   2 months ago Stage 3a chronic kidney disease [facility])   [facility], Coralie Keens, NP   11 months ago Mild episode of recurrent major depressive disorder [facility])   [facility] Olin Hauser, DO   12 months ago Other depression   [place], [place]   1 year ago Essential hypertension   [place], FNP      Future Appointments            In 1 month [facility], [facility]             esomeprazole (Santa Claus) 40 MG capsule [Pharmacy Med Name: ESOMEPRAZOLE MAGNESIUM 40 MG CAP] 90 capsule 0    Sig: TAKE 1 [place]     Gastroenterology: Proton Pump Inhibitors Passed - 04/13/2021 10:24 AM      Passed - Valid encounter within last 12 months    Recent Outpatient Visits          2 months ago Primary hypertension   [place], Coralie Keens, NP   2 months ago Stage 3a chronic kidney disease [facility])   [facility], NP   11 months ago Mild episode of recurrent major depressive disorder [facility])   [facility] Olin Hauser, DO   12 months ago Other depression   [facility], Lupita Raider, FNP   1 year ago Essential hypertension   [place], FNP      Future Appointments            In 1 month  [facility], [facility]

## 2021-05-10 IMAGING — MG DIGITAL SCREENING BILAT W/ TOMO W/ CAD
6 of 12 series · 6 of 36 positions shown · non-contrast
Comparison: Previous exam(s).

ACR Breast Density Category a: The breast tissue is almost entirely
fatty.

CLINICAL DATA: Screening.

EXAM:
DIGITAL SCREENING BILATERAL MAMMOGRAM WITH TOMO AND CAD

[L CC synth-2D]
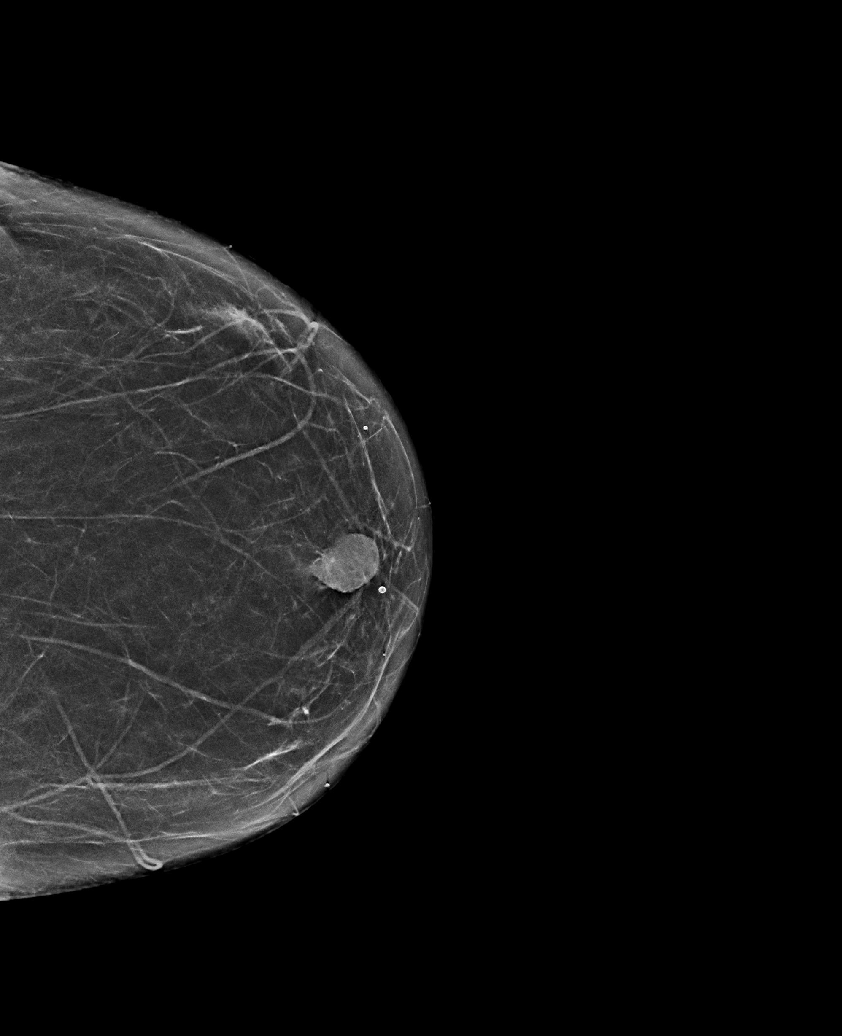

[L MLO synth-2D (1 of 2)]
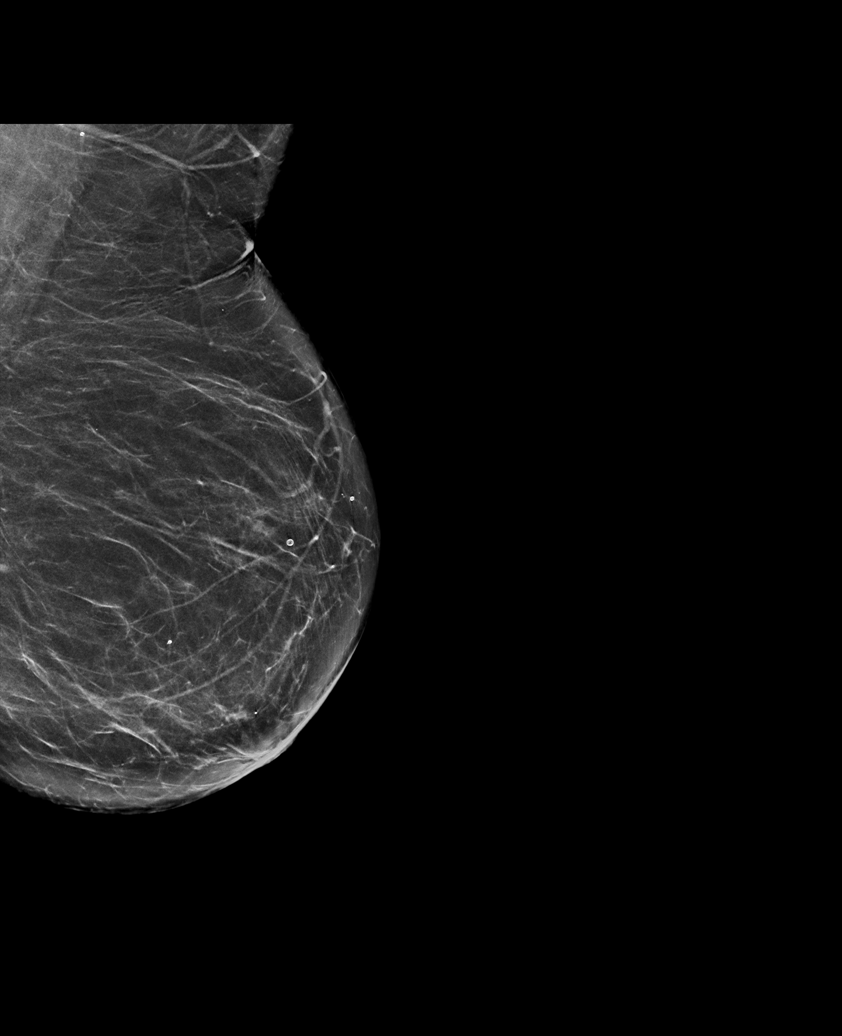

[L MLO synth-2D (2 of 2)]
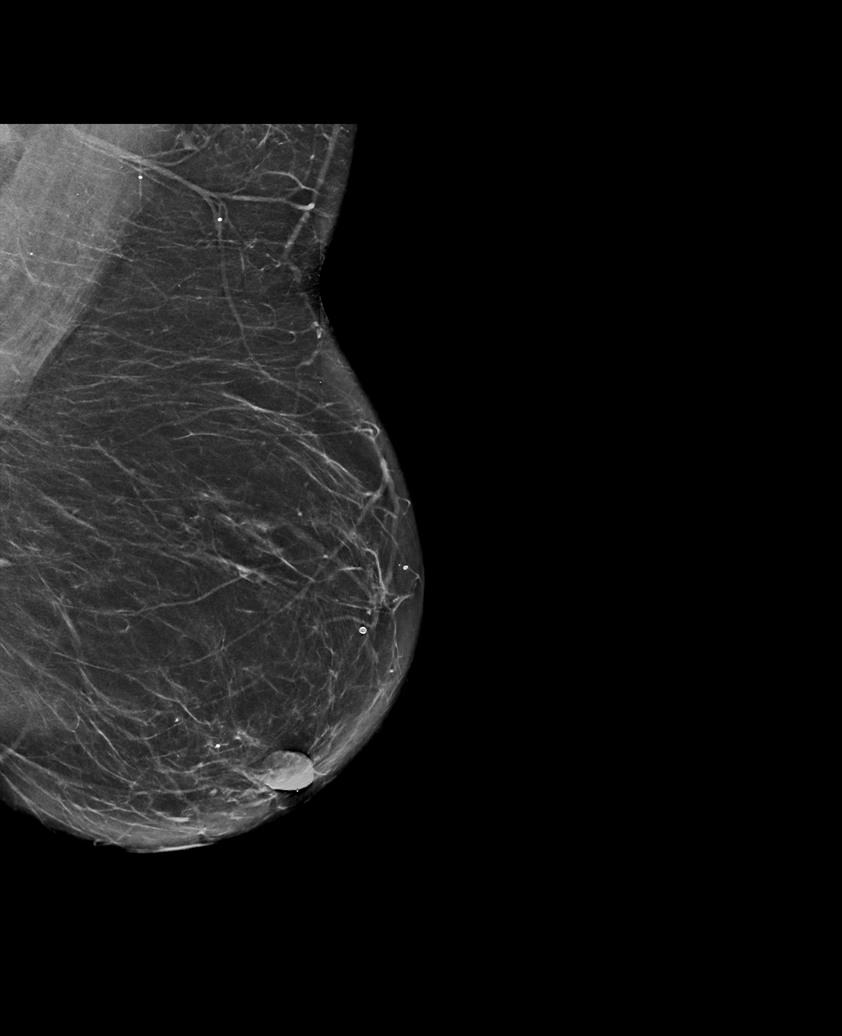

[R MLO synth-2D (1 of 2)]
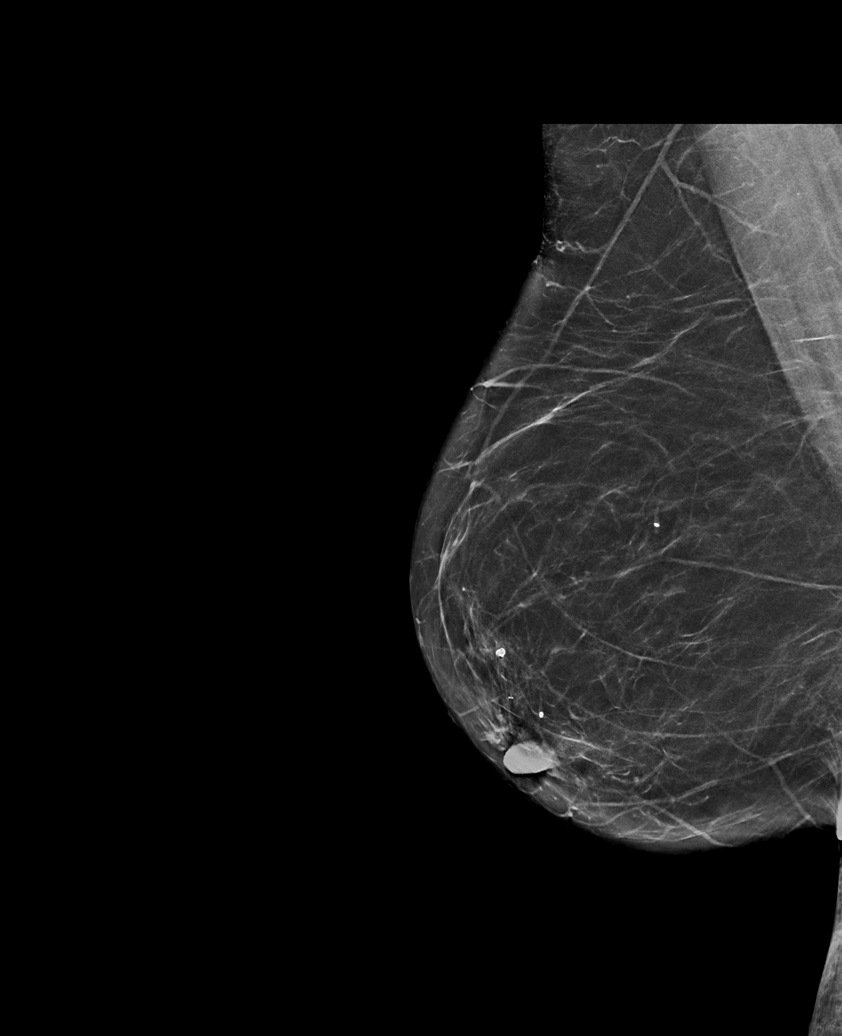

[R MLO synth-2D (2 of 2)]
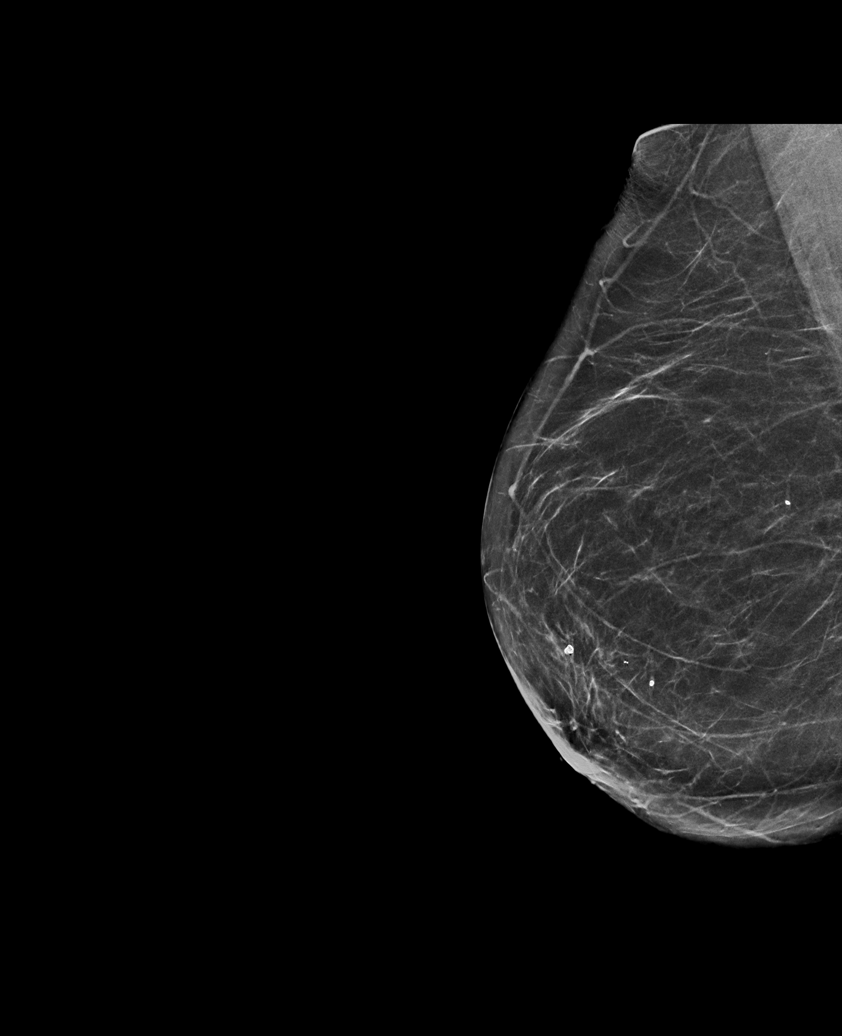

[R CC synth-2D]
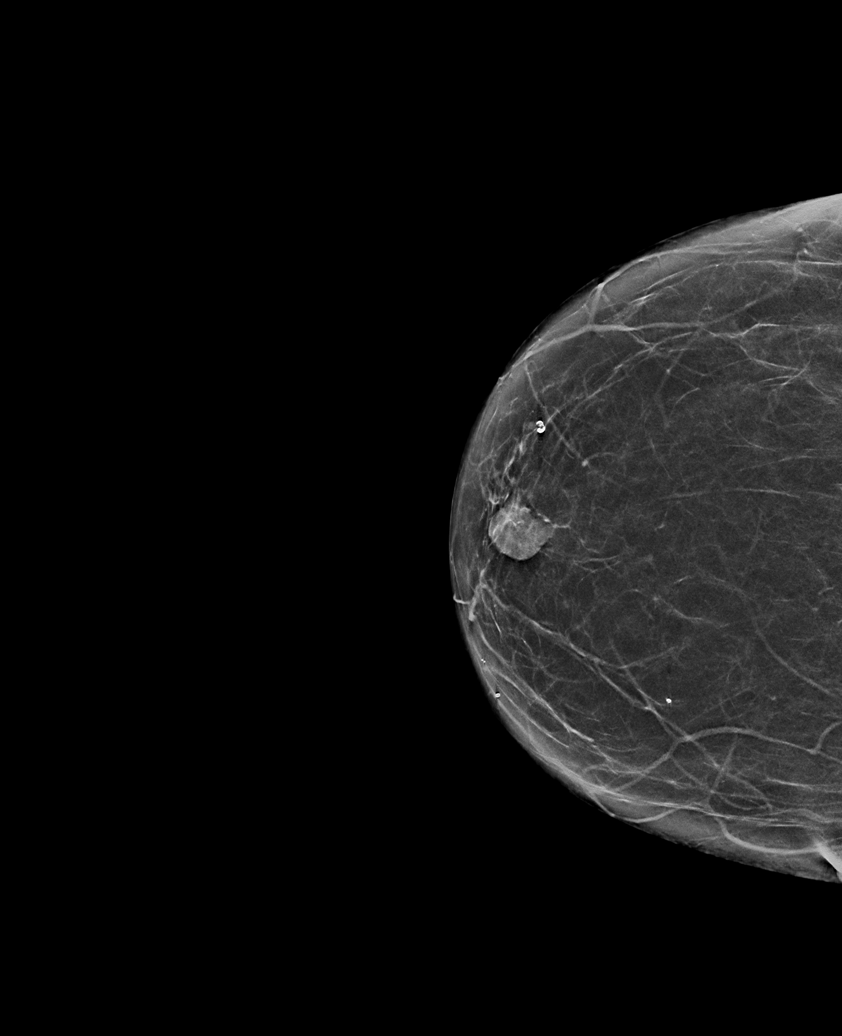

[6 of 36 positions shown; findings below may reference images not displayed]

FINDINGS: There are no findings suspicious for malignancy. Images were
processed with CAD.
IMPRESSION: No mammographic evidence of malignancy. A result letter of this
screening mammogram will be mailed directly to the patient.

RECOMMENDATION:
Screening mammogram in one year. (Code:[id number])

BI-RADS CATEGORY  1: Negative.

## 2021-05-17 ENCOUNTER — Ambulatory Visit (INDEPENDENT_AMBULATORY_CARE_PROVIDER_SITE_OTHER): Payer: PPO | Admitting: Licensed Clinical Social Worker

## 2021-05-17 DIAGNOSIS — M797 Fibromyalgia: Secondary | ICD-10-CM

## 2021-05-17 DIAGNOSIS — M810 Age-related osteoporosis without current pathological fracture: Secondary | ICD-10-CM

## 2021-05-17 DIAGNOSIS — G894 Chronic pain syndrome: Secondary | ICD-10-CM

## 2021-05-17 DIAGNOSIS — F321 Major depressive disorder, single episode, moderate: Secondary | ICD-10-CM

## 2021-05-17 DIAGNOSIS — I10 Essential (primary) hypertension: Secondary | ICD-10-CM

## 2021-05-17 DIAGNOSIS — N1831 Chronic kidney disease, stage 3a: Secondary | ICD-10-CM

## 2021-05-26 ENCOUNTER — Telehealth: Payer: PPO

## 2021-05-27 NOTE — Chronic Care Management (AMB) (Signed)
Chronic Care Management    Clinical Social Work Note  05/27/2021 Name: April Price MRN: [id number] DOB: 10-21-1955  April Price is a 66 y.o. year old female who is a primary care patient of Jearld Fenton, NP. The CCM team was consulted to assist the patient with chronic disease management and/or care coordination needs related to: Mental Health Counseling and Resources.   Engaged with patient by telephone for follow up visit in response to provider referral for social work chronic care management and care coordination services.   Consent to Services:  The patient was given information about Chronic Care Management services, agreed to services, and gave verbal consent prior to initiation of services.  Please see initial visit note for detailed documentation.   Patient agreed to services and consent obtained.   Summary:  Patient continues to maintain positive progress with care plan goals. Patient shared she is managing pain well. Endorses symptoms of seasonal depression and was successful in identifying coping skills that keeps her motivated. See Care Plan below for interventions and patient self-care activities.  Recommendation: Patient may benefit from, and is in agreement work with LCSW to address care coordination needs and will continue to work with the clinical team to address health care and disease management related needs.   Follow up Plan: Patient would like continued follow-up from CCM LCSW.  per patient's request will follow up in 08/02/21.  Will call office if needed prior to next encounter.  SDOH (Social Determinants of Health) assessments and interventions performed:    Advanced Directives Status: Not addressed in this encounter.  CCM Care Plan  Allergies  Allergen Reactions   Penicillins Anaphylaxis    Has patient had a PCN reaction causing immediate rash, facial/tongue/throat swelling, SOB or lightheadedness with hypotension: Yes Has patient had a  PCN reaction causing severe rash involving mucus membranes or skin necrosis: No Has patient had a PCN reaction that required hospitalization No Has patient had a PCN reaction occurring within the last 10 years: Yes If all of the above answers are "NO", then may proceed with Cephalosporin use.    Tylenol [Acetaminophen] Anaphylaxis, Itching and Hives    Ears and throat itch    Armoracia Rusticana Ext (Horseradish)    Morphine And Related Other (See Comments)    Depression    Nickel Itching   Pepcid [Famotidine] Nausea And Vomiting   Shrimp [Shellfish Allergy] Hives   Tape     irritation   Tetanus Toxoids Other (See Comments)    Swelling at injection site and fever   Wheat Bran    Corn-Containing Products     Has hay fever and swelling in fingers.   Gabapentin Other (See Comments)    Makes her feel "spacy" off balance, like she is coming out of anesthesia.     Outpatient Encounter Medications as of 05/17/2021  Medication Sig   albuterol (PROVENTIL HFA;VENTOLIN HFA) 108 (90 Base) MCG/ACT inhaler Inhale 2 puffs into the lungs every 4 (four) hours as needed for wheezing or shortness of breath.   amLODipine (NORVASC) 2.5 MG tablet Take 1 tablet (2.5 mg total) by mouth 2 (two) times daily.   azelastine (ASTELIN) 0.1 % nasal spray Place 2 sprays into both nostrils 2 (two) times daily as needed for rhinitis. Use in each nostril as directed   EPINEPHrine (EPIPEN 2-PAK) 0.3 mg/0.3 mL IJ SOAJ injection Inject 0.3 mLs (0.3 mg total) into the muscle once for 1 dose.   esomeprazole (NEXIUM) 40 MG  capsule TAKE 1 CAPSULE BY MOUTH EVERY DAY   fluticasone (FLONASE) 50 MCG/ACT nasal spray Place 2 sprays into both nostrils daily.   lisinopril (ZESTRIL) 5 MG tablet Take 1 tablet (5 mg total) by mouth daily.   lovastatin (MEVACOR) 10 MG tablet Take 1 tablet (10 mg total) by mouth every other day.   Multiple Vitamins-Minerals (PRESERVISION AREDS 2 PO) Take by mouth.   tiZANidine (ZANAFLEX) 4 MG tablet TAKE  ONE TABLET (4 MG) BY MOUTH EVERY 8 HOURS AS NEEDED FOR NUSCLE SPASMS.   traMADol (ULTRAM) 50 MG tablet Take 50 mg by mouth every 8 (eight) hours as needed.   No facility-administered encounter medications on file as of 05/17/2021.    Patient Active Problem List   Diagnosis Date Noted   Polycythemia 01/27/2021   Statin intolerance 01/27/2021   Stage 3a chronic kidney disease ([place]) 01/27/2021   Overweight with body mass index (BMI) of 26 to 26.9 in adult 01/27/2021   Hyperlipidemia 11/21/2019   GERD (gastroesophageal reflux disease) 11/21/2019   Chronic lumbar radicular pain (Left) 04/18/2016   Neurogenic pain 02/11/2016   Chronic pain syndrome 02/10/2016   Long term prescription opiate use 02/10/2016   Opiate use (80-140 MME/Day) 02/10/2016   Chronic shoulder pain (Location of Primary Source of Pain) (Bilateral) (L>R) 02/10/2016   Chronic hip pain (Location of Secondary source of pain) (Bilateral) (L>R) 02/10/2016   Chronic sacroiliac joint pain (Location of Tertiary source of pain) (Bilateral) (L>R) 02/10/2016   Chronic low back pain (Bilateral) (L>R) 02/10/2016   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   Chronic lower extremity pain (Bilateral) (L>R) 02/10/2016   Chronic knee pain (Bilateral) (L>R) 02/10/2016   Chronic elbow pain (Bilateral) (L>R) 02/10/2016   Chronic hand pain (Bilateral) (L>R) 02/10/2016   Hypertension 02/06/2012   Chronic fatigue syndrome 02/06/2012   Osteoporosis 02/06/2012   Chronic neck pain 01/31/2011   Depression 01/13/2011   Fibromyalgia 01/12/2011    Conditions to be addressed/monitored: HTN, CKD Stage 3a, Depression, and Osteoporosis, and Chronic Pain   Care Plan : General Social Work (Adult)  Updates made by Rebekah [place], LCSW since 05/27/2021 12:00 AM     Problem: Quality of Life (General Plan of Care)      Long-Range Goal: Quality of Life Maintained   Start Date: 07/14/2020  This Visit's Progress: On track  Recent Progress: On track   Priority: Medium  Note:   Timeframe:  Long-Range Goal Priority:  Medium Start Date: 07/14/20            Expected End Date: 06/07/21                     Follow Up Date - 08/02/21  Current Barriers:  Chronic Mental Health needs related to pain and major depressive disorder Financial constraints related to managing health care expenses Limited social support ADL IADL limitations Mental Health Concerns  Limited access to caregiver Suicidal Ideation/Homicidal Ideation: No Clinical Social Work Goal(s):  Over the next 120 days, patient will work with SW bi-monthly by telephone or in person to reduce or manage symptoms related to chronic pain and depression Over the next 120 days, patient will work with SW to address concerns related to ongoing stress, isolation, pain and depressive symptoms Interventions: Patient interviewed and appropriate assessments performed: brief mental health assessment Patient endorses an increase in stress triggered by recent medical bills that she received in May for $50 and June for $100. Patient reports that insurance company identifies CCM  services and all providers at [facility] (except Dr. Robyne Askew) as out of network, resulting in bills 11/30: This has been resolved Patient was successful in identifying that stress negatively impacts pain management and anxiety management CCM LCSW provided support and validation to patient's feelings. She is aware of healthy coping skills Patient endorses elevated blood pressure readings at [facility] appts, noting they are stable at other appts with pain clinic and at home. She reports compliance with medications and checks every other day with the monitor provided by CCM RN Patient reports that she has been successful with pain management with the assistance of specialist. She has an upcoming appointment scheduled next week with [facility] at the [facility]. She receives injections on her SI joints. This treatment assists with  pain relief for approximately 2.5 months 11/30: Patient shared she is managing pain pretty good She was provided injections and was excited about not requiring an injection on her right side due to decreased pain. Patient's next appt is scheduled 03/28/21  She has good insight of how pain negatively impacts depression symptoms. Patient successfully identified trends in pain to assist with plan to cope/manage 2/7: Patient shared she is managing pain well. Endorses symptoms of seasonal depression and was successful in identifying coping skills that keeps her motivated  CCM LCSW inquired about coping skills. Patient reports that pain increases with inclement weather. Since it has been raining often, pt has been intentional on taking her time to prevent falls. She utilizes a cane and rollator walker to improve balance 11/30: Patient continues to have difficulty with balance, stating, she fell two weeks ago (did not sustain any injuries) Strategies to decrease fall risk and promote safety discussed (Patient has 3 house phones, a cell phone, cane and walker in the home) 2/7: Patient endorses balance concerns and has fell. Learning to slow down and not move too fast. Pt has a walker (rollator) that can't fit in her vehicle and has agreed to speak with PCP regarding request for new walker Patient continues to drive and will utilize friends, who are a strong support system for her, to assist if needed. Family resides in [place] but they talk 2-3 times a week via phone Patient enjoys reading and spending time with her dog, who keeps her active and is emotionally supportive. She is compliant with medications prescribed by provider CCM LCSW completed care guide referral for assistance with repairing a rail outside patient's home to promote safety 2/7: Patient received information from [place], resources have out of pocket costs. Per pt, BIL will assist with ramp in a couple of months CCM LCSW reviewed recent phq9 and  gad7 scores Reviewed upcoming appointments LCSW used empathetic and active and reflective listening, validated patient's feelings/concerns, and provided emotional support Discussed plans with patient for ongoing care management follow up and provided patient with direct contact information for care management team Patient Self Care Activities:  Attend all scheduled appointments with providers Continue to comply with medication management Continue utilizing strategies discussed to assist with management of symptoms       Christa See, MSW, [email or web address] Phone [phone] 6:16 AM

## 2021-05-27 NOTE — Patient Instructions (Signed)
Visit Information  Thank you for taking time to visit with me today. Please don't hesitate to contact me if I can be of assistance to you before our next scheduled telephone appointment.  Following are the goals we discussed today:  Patient Self Care Activities:  Attend all scheduled appointments with providers Continue to comply with medication management Continue utilizing strategies discussed to assist with management of symptoms  Our next appointment is by telephone on 08/02/21 at 10:00 AM  Please call the care guide team at [phone] if you need to cancel or reschedule your appointment.   If you are experiencing a Mental Health or [place] or need someone to talk to, please call the [place] National Suicide Prevention Lifeline: (607)197-5920 or TTY: 570-655-2415 TTY 304-507-9823) to talk to a trained counselor call 911   Patient verbalizes understanding of instructions and care plan provided today and agrees to view in [place]. Active MyChart status confirmed with patient.    Christa See, MSW, [place] [facility] Care Management [email or web address] Phone [phone] 6:17 AM

## 2021-06-07 ENCOUNTER — Ambulatory Visit: Payer: PPO

## 2021-06-07 DIAGNOSIS — I10 Essential (primary) hypertension: Secondary | ICD-10-CM

## 2021-06-07 DIAGNOSIS — F321 Major depressive disorder, single episode, moderate: Secondary | ICD-10-CM

## 2021-06-07 DIAGNOSIS — M810 Age-related osteoporosis without current pathological fracture: Secondary | ICD-10-CM

## 2021-06-07 DIAGNOSIS — N1831 Chronic kidney disease, stage 3a: Secondary | ICD-10-CM

## 2021-06-09 ENCOUNTER — Telehealth: Payer: Self-pay

## 2021-06-09 ENCOUNTER — Telehealth: Payer: PPO

## 2021-06-09 ENCOUNTER — Ambulatory Visit (INDEPENDENT_AMBULATORY_CARE_PROVIDER_SITE_OTHER): Payer: PPO

## 2021-06-09 DIAGNOSIS — F321 Major depressive disorder, single episode, moderate: Secondary | ICD-10-CM

## 2021-06-09 DIAGNOSIS — G894 Chronic pain syndrome: Secondary | ICD-10-CM

## 2021-06-09 DIAGNOSIS — I10 Essential (primary) hypertension: Secondary | ICD-10-CM

## 2021-06-09 DIAGNOSIS — F419 Anxiety disorder, unspecified: Secondary | ICD-10-CM

## 2021-06-09 DIAGNOSIS — Z9181 History of falling: Secondary | ICD-10-CM

## 2021-06-09 DIAGNOSIS — F3289 Other specified depressive episodes: Secondary | ICD-10-CM

## 2021-06-09 DIAGNOSIS — M797 Fibromyalgia: Secondary | ICD-10-CM

## 2021-06-09 DIAGNOSIS — E782 Mixed hyperlipidemia: Secondary | ICD-10-CM

## 2021-06-09 NOTE — Chronic Care Management (AMB) (Signed)
Chronic Care Management   CCM RN Visit Note  06/09/2021 Name: April Price MRN: [id number] DOB: 1955-04-14  Subjective: April Price is a 66 y.o. year old female who is a primary care patient of Jearld Fenton, NP. The care management team was consulted for assistance with disease management and care coordination needs.    Engaged with patient by telephone for follow up visit in response to provider referral for case management and/or care coordination services.   Consent to Services:  The patient was given information about Chronic Care Management services, agreed to services, and gave verbal consent prior to initiation of services.  Please see initial visit note for detailed documentation.   Patient agreed to services and verbal consent obtained.   Assessment: Review of patient past medical history, allergies, medications, health status, including review of consultants reports, laboratory and other test data, was performed as part of comprehensive evaluation and provision of chronic care management services.   SDOH (Social Determinants of Health) assessments and interventions performed:    CCM Care Plan  Allergies  Allergen Reactions   Penicillins Anaphylaxis    Has patient had a PCN reaction causing immediate rash, facial/tongue/throat swelling, SOB or lightheadedness with hypotension: Yes Has patient had a PCN reaction causing severe rash involving mucus membranes or skin necrosis: No Has patient had a PCN reaction that required hospitalization No Has patient had a PCN reaction occurring within the last 10 years: Yes If all of the above answers are "NO", then may proceed with Cephalosporin use.    Tylenol [Acetaminophen] Anaphylaxis, Itching and Hives    Ears and throat itch    Armoracia Rusticana Ext (Horseradish)    Morphine And Related Other (See Comments)    Depression    Nickel Itching   Pepcid [Famotidine] Nausea And Vomiting   Shrimp [Shellfish  Allergy] Hives   Tape     irritation   Tetanus Toxoids Other (See Comments)    Swelling at injection site and fever   Wheat Bran    Corn-Containing Products     Has hay fever and swelling in fingers.   Gabapentin Other (See Comments)    Makes her feel "spacy" off balance, like she is coming out of anesthesia.     Outpatient Encounter Medications as of 06/09/2021  Medication Sig   albuterol (PROVENTIL HFA;VENTOLIN HFA) 108 (90 Base) MCG/ACT inhaler Inhale 2 puffs into the lungs every 4 (four) hours as needed for wheezing or shortness of breath.   amLODipine (NORVASC) 2.5 MG tablet Take 1 tablet (2.5 mg total) by mouth 2 (two) times daily.   azelastine (ASTELIN) 0.1 % nasal spray Place 2 sprays into both nostrils 2 (two) times daily as needed for rhinitis. Use in each nostril as directed   EPINEPHrine (EPIPEN 2-PAK) 0.3 mg/0.3 mL IJ SOAJ injection Inject 0.3 mLs (0.3 mg total) into the muscle once for 1 dose.   esomeprazole (NEXIUM) 40 MG capsule TAKE 1 CAPSULE BY MOUTH EVERY DAY   fluticasone (FLONASE) 50 MCG/ACT nasal spray Place 2 sprays into both nostrils daily.   lisinopril (ZESTRIL) 5 MG tablet Take 1 tablet (5 mg total) by mouth daily.   lovastatin (MEVACOR) 10 MG tablet Take 1 tablet (10 mg total) by mouth every other day.   Multiple Vitamins-Minerals (PRESERVISION AREDS 2 PO) Take by mouth.   tiZANidine (ZANAFLEX) 4 MG tablet TAKE ONE TABLET (4 MG) BY MOUTH EVERY 8 HOURS AS NEEDED FOR NUSCLE SPASMS.   traMADol (ULTRAM) 50 MG  tablet Take 50 mg by mouth every 8 (eight) hours as needed.   No facility-administered encounter medications on file as of 06/09/2021.    Patient Active Problem List   Diagnosis Date Noted   Polycythemia 01/27/2021   Statin intolerance 01/27/2021   Stage 3a chronic kidney disease ([place]) 01/27/2021   Overweight with body mass index (BMI) of 26 to 26.9 in adult 01/27/2021   Hyperlipidemia 11/21/2019   GERD (gastroesophageal reflux disease) 11/21/2019    Chronic lumbar radicular pain (Left) 04/18/2016   Neurogenic pain 02/11/2016   Chronic pain syndrome 02/10/2016   Long term prescription opiate use 02/10/2016   Opiate use (80-140 MME/Day) 02/10/2016   Chronic shoulder pain (Location of Primary Source of Pain) (Bilateral) (L>R) 02/10/2016   Chronic hip pain (Location of Secondary source of pain) (Bilateral) (L>R) 02/10/2016   Chronic sacroiliac joint pain (Location of Tertiary source of pain) (Bilateral) (L>R) 02/10/2016   Chronic low back pain (Bilateral) (L>R) 02/10/2016   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   Chronic lower extremity pain (Bilateral) (L>R) 02/10/2016   Chronic knee pain (Bilateral) (L>R) 02/10/2016   Chronic elbow pain (Bilateral) (L>R) 02/10/2016   Chronic hand pain (Bilateral) (L>R) 02/10/2016   Hypertension 02/06/2012   Chronic fatigue syndrome 02/06/2012   Osteoporosis 02/06/2012   Chronic neck pain 01/31/2011   Depression 01/13/2011   Fibromyalgia 01/12/2011    Conditions to be addressed/monitored:HTN, HLD, Anxiety, Depression, and Chronic pain and falls prevention and safety  Care Plan : RNCM: General Plan of Care (Adult) for Chronic Disease Management and Care Coordination Needs  Updates made by Vanita Ingles, RN since 06/09/2021 12:00 AM     Problem: RNCM: Development of plan of care for Chronic disease Management (HTM, HLD, Depression, Anxiety, and  Chronic Pain)   Priority: High     Long-Range Goal: RNCM: Effective Management  of plan of care for Chronic disease Management (HTM, HLD, Depression, Anxiety, and  Chronic Pain)   Start Date: 03/24/2021  Expected End Date: 03/24/2022  Priority: High  Note:   Current Barriers:  Knowledge Deficits related to plan of care for management of HTN, HLD, Anxiety with Excessive Worry, and Depression: depressed mood anxiety, and chronic pain- fibromyalgia  Care Coordination needs related to Level of care concerns and no resolution to a bill for CCM services  with insurance, pending assistance   Chronic Disease Management support and education needs related to HTN, HLD, Anxiety with Excessive Worry, and Depression: depressed mood anxiety, and chronic pain  Lacks caregiver support.         RNCM Clinical Goal(s):  Patient will verbalize basic understanding of HTN, HLD, Anxiety, Depression, and chronic pain  disease process and self health management plan as evidenced by keeping appointments, following plan of care, dietary restrictions, and working with the pcp and CCM team to effectively manage health and well being take all medications exactly as prescribed and will call provider for medication related questions as evidenced by compliance and calling for refills before running out of medications    attend all scheduled medical appointments: sees specialist and pcp as directed  as evidenced by keeping appointments and calling the office for needed schedule changes         continue to work with Consulting civil engineer and/or Social Worker to address care management and care coordination needs related to HTN, HLD, Anxiety, Depression, and chronic pain as evidenced by adherence to CM Team Scheduled appointments     demonstrate a decrease in HTN,  HLD, Anxiety, Depression, and chronic pain  exacerbations  as evidenced by decreased level of pain, stable blood pressures and no exacerbations with depression and anxiety demonstrate ongoing self health care management ability effectively manage chronic conditions  as evidenced by  working with the CCM team through collaboration with Consulting civil engineer, provider, and care team.   Interventions: 1:1 collaboration with primary care provider regarding development and update of comprehensive plan of care as evidenced by provider attestation and co-signature Inter-disciplinary care team collaboration (see longitudinal plan of care) Evaluation of current treatment plan related to  self management and patient's adherence to plan as  established by provider   Anxiety and Depression   (Status: Goal on Track (progressing): YES.) Long Term Goal  Evaluation of current treatment plan related to Anxiety and Depression, Level of care concerns and Mental Health Concerns  self-management and patient's adherence to plan as established by provider. 06-09-2021: The patient is doing well with her depression and anxiety right now. She says when her pain is well controlled that she is in better spirits. Denies any acute distress today. Is having a little more pain than usual today due to the rainy weather but has taken pain medications.  Discussed plans with patient for ongoing care management follow up and provided patient with direct contact information for care management team Advised patient to call the office for changes in mood, anxiety, or depression ; Provided education to patient re: working with the CCM team to effectively mange mental health and well being ; Reviewed medications with patient and discussed compliance ; Collaborated with Interior and spatial designer and other CCM team support  regarding CCM bill that has not been corrected that the patient continues to get billed for. Secure email sent to management today to assist with resources for helping resolve this matter as it is causing increased anxiety for the patient; Provided patient with anxiety and depression educational materials related to effective management of depression and anxiety. 06-09-2021: The patient is also working with the LCSW for effective management of depression and anxiety. She has a good support system and is thankful for the support from the pcp and CCM team.  ; Social Work referral for ongoing support and education for effective management of mental health health and well being. 06-09-2021: Continues to work with the CCM LCSW; Discussed plans with patient for ongoing care management follow up and provided patient with direct contact information for care management  team; Advised patient to discuss changes in anxiety and depression  with provider; Screening for signs and symptoms of depression related to chronic disease state;  Assessed social determinant of health barriers;   Hyperlipidemia:  (Status: Goal on Track (progressing): YES.) Long Term Goal  Lab Results  Component Value Date   CHOL 243 (H) 01/27/2021   HDL 50 01/27/2021   LDLCALC 170 (H) 01/27/2021   TRIG 107 01/27/2021   CHOLHDL 4.9 01/27/2021     Medication review performed; medication list updated in electronic medical record. 06-09-2021: Takes lovastatin 10 mg QD  Provider established cholesterol goals reviewed; Counseled on importance of regular laboratory monitoring as prescribed. 06-09-2021: Has lab work on a regular basis; Provided HLD educational materials; Reviewed role and benefits of statin for ASCVD risk reduction; Discussed strategies to manage statin-induced myalgias; Reviewed importance of limiting foods high in cholesterol. 06-09-2021: Is compliant with dietary restrictions Reviewed exercise goals and target of 150 minutes per week. 06-09-2021: Is not able to exercise well due to chronic pain and health issues.  Hypertension: (Status: Goal on Track (progressing): YES.) Last practice recorded BP readings:  BP Readings from Last 3 Encounters:  02/25/21 122/68  02/10/21 (!) 142/66  01/27/21 (!) 165/71  Most recent eGFR/CrCl:  Lab Results  Component Value Date   EGFR 42 (L) 01/27/2021    No components found for: CRCL  Evaluation of current treatment plan related to hypertension self management and patient's adherence to plan as established by provider. 03-24-2021: States that her blood pressures are more stable now. States last pressure was 120/68. Denies any episodes of hypotension, review of safety and falls prevention as she had a fall back in November without injury. Denies any further falls.   06-09-2021: The patient has more stable blood pressures. Denies any acute  changes with HTN or heart health. Provided education to patient re: stroke prevention, s/s of heart attack and stroke; Reviewed prescribed diet heart healthy. 06-09-2021: Is compliant with heart healthy diet Reviewed medications with patient and discussed importance of compliance. 06-09-2021: The patient is compliant with medications;  Discussed plans with patient for ongoing care management follow up and provided patient with direct contact information for care management team; Advised patient, providing education and rationale, to monitor blood pressure daily and record, calling PCP for findings outside established parameters;  Advised patient to discuss blood pressure trends  with provider; Provided education on prescribed diet heart healthy;  Discussed complications of poorly controlled blood pressure such as heart disease, stroke, circulatory complications, vision complications, kidney impairment, sexual dysfunction;   Pain:  (Status: Goal on Track (progressing): YES.) Long Term Goal  Pain assessment performed. 03-24-2021: The patient states that her pain level is at a 7 today and that is due to the change in weather. In the winter she has worse pain. On a good day it is usually at a 2 to 3. Today is not such a good day. She describes her pain as a "pressure like going to explode". States she is taking medications as directed and her muscle spasms are worse and she has been having to take her Tizanidine $RemoveBefore'4mg'[id number]$  4 times a day. She also uses heating pad. Review of safety when using heat application. Sees specialist to get injections to SI joints when needed. Pain is mainly in the right hip today. Does have fibromyalgia. When driving a couple of days she had spasms in her right leg and hip. Review of safety when driving. Denies any acute distress today. Will continue to monitor for changes. 06-09-2021: Rates her pain level at a 4/5 today on a scale of 0-10. The patient states on rainy days her pain is worse. She  has taken medication and the medication is working.  Medications reviewed. 06-09-2021: Has adequate medications for pain control Reviewed provider established plan for pain management. 06-09-2021: Is compliant with the plan of care for effective management of chronic pain and discomfort; Discussed importance of adherence to all scheduled medical appointments. 06-09-2021: Is compliant with the appointments and follow ups with providers; Counseled on the importance of reporting any/all new or changed pain symptoms or management strategies to pain management provider; Advised patient to report to care team affect of pain on daily activities; Discussed use of relaxation techniques and/or diversional activities to assist with pain reduction (distraction, imagery, relaxation, massage, acupressure, TENS, heat, and cold application; Reviewed with patient prescribed pharmacological and nonpharmacological pain relief strategies; Advised patient to discuss unresolved pain, changes in level or intensity of pain  with provider;   Falls:  (Status: Goal on Track (  progressing): YES.) Long Term Goal  Provided written and verbal education re: potential causes of falls and Fall prevention strategies. 06-09-2021: The patient had a fall in November with injury. The patient denies any new falls since talking to the LCSW in February. The patient states she is being careful and the biggest issue is when she is changing direction and she loses her balance easily. The patient feels she would benefit from a standard walker with wheels on the front. Will collaborate with the pcp and see if a script can be sent to [facility] [place]. [address], [place], [place] [address]. Phone [phone]. The patient states she does not always need this but has found that her balance is better if she has support. She has a Rolator walker but cannot easily fold it up and put it in the back seat. She has had this for many years.  Reviewed  medications and discussed potential side effects of medications such as dizziness and frequent urination Advised patient of importance of notifying provider of falls.  Assessed for signs and symptoms of orthostatic hypotension Assessed for falls since last encounter. 06-09-2021: Fall with injury in November. No new falls since talking to the LCSW in February. Review and education given.  Assessed patients knowledge of fall risk prevention secondary to previously provided education. 06-09-2021: The patient is being careful and monitoring her position changes and directional changes. Denies any acute findings today. Education and support given.  Advised patient to discuss new falls or questions and concerns with balance issues, safety with provider   Patient Goals/Self-Care Activities: Take medications as prescribed   Attend all scheduled provider appointments Call pharmacy for medication refills 3-7 days in advance of running out of medications Attend church or other social activities Perform all self care activities independently  Perform IADL's (shopping, preparing meals, housekeeping, managing finances) independently Call provider office for new concerns or questions  Work with the social worker to address care coordination needs and will continue to work with the clinical team to address health care and disease management related needs call the Suicide and Crisis Lifeline: 988 call the [place] National Suicide Prevention Lifeline: 8626009357 or TTY: 2406555029 TTY 872-372-5473) to talk to a trained counselor call 1-800-273-TALK (toll free, 24 hour hotline) if experiencing a Mental Health or [place]  check blood pressure 3 times per week choose a place to take my blood pressure (home, clinic or office, retail store) write blood pressure results in a log or diary learn about high blood pressure keep a blood pressure log take blood pressure log to all doctor  appointments call doctor for signs and symptoms of high blood pressure develop an action plan for high blood pressure keep all doctor appointments take medications for blood pressure exactly as prescribed begin an exercise program report new symptoms to your doctor eat more whole grains, fruits and vegetables, lean meats and healthy fats - call for medicine refill 2 or 3 days before it runs out - take all medications exactly as prescribed - call doctor with any symptoms you believe are related to your medicine - call doctor when you experience any new symptoms - go to all doctor appointments as scheduled - adhere to prescribed diet: Heart Healthy diet       Plan:Telephone follow up appointment with care management team member scheduled for:  08-11-2021 at [place] am  Noreene Larsson RN, MSN, [facility] Mobile: [phone]

## 2021-06-09 NOTE — Patient Instructions (Signed)
Visit Information  Thank you for taking time to visit with me today. Please don't hesitate to contact me if I can be of assistance to you before our next scheduled telephone appointment.  Following are the goals we discussed today:  RNCM Clinical Goal(s):  Patient will verbalize basic understanding of HTN, HLD, Anxiety, Depression, and chronic pain  disease process and self health management plan as evidenced by keeping appointments, following plan of care, dietary restrictions, and working with the pcp and CCM team to effectively manage health and well being take all medications exactly as prescribed and will call provider for medication related questions as evidenced by compliance and calling for refills before running out of medications    attend all scheduled medical appointments: sees specialist and pcp as directed  as evidenced by keeping appointments and calling the office for needed schedule changes         continue to work with Consulting civil engineer and/or Social Worker to address care management and care coordination needs related to HTN, HLD, Anxiety, Depression, and chronic pain as evidenced by adherence to CM Team Scheduled appointments     demonstrate a decrease in HTN, HLD, Anxiety, Depression, and chronic pain  exacerbations  as evidenced by decreased level of pain, stable blood pressures and no exacerbations with depression and anxiety demonstrate ongoing self health care management ability effectively manage chronic conditions  as evidenced by  working with the CCM team through collaboration with Consulting civil engineer, provider, and care team.    Interventions: 1:1 collaboration with primary care provider regarding development and update of comprehensive plan of care as evidenced by provider attestation and co-signature Inter-disciplinary care team collaboration (see longitudinal plan of care) Evaluation of current treatment plan related to  self management and patient's adherence to plan as  established by provider     Anxiety and Depression   (Status: Goal on Track (progressing): YES.) Long Term Goal  Evaluation of current treatment plan related to Anxiety and Depression, Level of care concerns and Mental Health Concerns  self-management and patient's adherence to plan as established by provider. 06-09-2021: The patient is doing well with her depression and anxiety right now. She says when her pain is well controlled that she is in better spirits. Denies any acute distress today. Is having a little more pain than usual today due to the rainy weather but has taken pain medications.  Discussed plans with patient for ongoing care management follow up and provided patient with direct contact information for care management team Advised patient to call the office for changes in mood, anxiety, or depression ; Provided education to patient re: working with the CCM team to effectively mange mental health and well being ; Reviewed medications with patient and discussed compliance ; Collaborated with Interior and spatial designer and other CCM team support  regarding CCM bill that has not been corrected that the patient continues to get billed for. Secure email sent to management today to assist with resources for helping resolve this matter as it is causing increased anxiety for the patient; Provided patient with anxiety and depression educational materials related to effective management of depression and anxiety. 06-09-2021: The patient is also working with the LCSW for effective management of depression and anxiety. She has a good support system and is thankful for the support from the pcp and CCM team.  ; Social Work referral for ongoing support and education for effective management of mental health health and well being. 06-09-2021: Continues to work with  the CCM LCSW; Discussed plans with patient for ongoing care management follow up and provided patient with direct contact information for care management  team; Advised patient to discuss changes in anxiety and depression  with provider; Screening for signs and symptoms of depression related to chronic disease state;  Assessed social determinant of health barriers;    Hyperlipidemia:  (Status: Goal on Track (progressing): YES.) Long Term Goal       Lab Results  Component Value Date    CHOL 243 (H) 01/27/2021    HDL 50 01/27/2021    LDLCALC 170 (H) 01/27/2021    TRIG 107 01/27/2021    CHOLHDL 4.9 01/27/2021      Medication review performed; medication list updated in electronic medical record. 06-09-2021: Takes lovastatin 10 mg QD  Provider established cholesterol goals reviewed; Counseled on importance of regular laboratory monitoring as prescribed. 06-09-2021: Has lab work on a regular basis; Provided HLD educational materials; Reviewed role and benefits of statin for ASCVD risk reduction; Discussed strategies to manage statin-induced myalgias; Reviewed importance of limiting foods high in cholesterol. 06-09-2021: Is compliant with dietary restrictions Reviewed exercise goals and target of 150 minutes per week. 06-09-2021: Is not able to exercise well due to chronic pain and health issues.    Hypertension: (Status: Goal on Track (progressing): YES.) Last practice recorded BP readings:     BP Readings from Last 3 Encounters:  02/25/21 122/68  02/10/21 (!) 142/66  01/27/21 (!) 165/71  Most recent eGFR/CrCl:       Lab Results  Component Value Date    EGFR 42 (L) 01/27/2021    No components found for: CRCL   Evaluation of current treatment plan related to hypertension self management and patient's adherence to plan as established by provider. 03-24-2021: States that her blood pressures are more stable now. States last pressure was 120/68. Denies any episodes of hypotension, review of safety and falls prevention as she had a fall back in November without injury. Denies any further falls.   06-09-2021: The patient has more stable blood  pressures. Denies any acute changes with HTN or heart health. Provided education to patient re: stroke prevention, s/s of heart attack and stroke; Reviewed prescribed diet heart healthy. 06-09-2021: Is compliant with heart healthy diet Reviewed medications with patient and discussed importance of compliance. 06-09-2021: The patient is compliant with medications;  Discussed plans with patient for ongoing care management follow up and provided patient with direct contact information for care management team; Advised patient, providing education and rationale, to monitor blood pressure daily and record, calling PCP for findings outside established parameters;  Advised patient to discuss blood pressure trends  with provider; Provided education on prescribed diet heart healthy;  Discussed complications of poorly controlled blood pressure such as heart disease, stroke, circulatory complications, vision complications, kidney impairment, sexual dysfunction;    Pain:  (Status: Goal on Track (progressing): YES.) Long Term Goal  Pain assessment performed. 03-24-2021: The patient states that her pain level is at a 7 today and that is due to the change in weather. In the winter she has worse pain. On a good day it is usually at a 2 to 3. Today is not such a good day. She describes her pain as a "pressure like going to explode". States she is taking medications as directed and her muscle spasms are worse and she has been having to take her Tizanidine $RemoveBefore'4mg'gnSFgNSvGfVUK$  4 times a day. She also uses heating pad. Review of safety when using  heat application. Sees specialist to get injections to SI joints when needed. Pain is mainly in the right hip today. Does have fibromyalgia. When driving a couple of days she had spasms in her right leg and hip. Review of safety when driving. Denies any acute distress today. Will continue to monitor for changes. 06-09-2021: Rates her pain level at a 4/5 today on a scale of 0-10. The patient states on rainy  days her pain is worse. She has taken medication and the medication is working.  Medications reviewed. 06-09-2021: Has adequate medications for pain control Reviewed provider established plan for pain management. 06-09-2021: Is compliant with the plan of care for effective management of chronic pain and discomfort; Discussed importance of adherence to all scheduled medical appointments. 06-09-2021: Is compliant with the appointments and follow ups with providers; Counseled on the importance of reporting any/all new or changed pain symptoms or management strategies to pain management provider; Advised patient to report to care team affect of pain on daily activities; Discussed use of relaxation techniques and/or diversional activities to assist with pain reduction (distraction, imagery, relaxation, massage, acupressure, TENS, heat, and cold application; Reviewed with patient prescribed pharmacological and nonpharmacological pain relief strategies; Advised patient to discuss unresolved pain, changes in level or intensity of pain  with provider;     Falls:  (Status: Goal on Track (progressing): YES.) Long Term Goal  Provided written and verbal education re: potential causes of falls and Fall prevention strategies. 06-09-2021: The patient had a fall in November with injury. The patient denies any new falls since talking to the LCSW in February. The patient states she is being careful and the biggest issue is when she is changing direction and she loses her balance easily. The patient feels she would benefit from a standard walker with wheels on the front. Will collaborate with the pcp and see if a script can be sent to [facility] [place]. [address] [place], [place] [address]. Phone [phone]. The patient states she does not always need this but has found that her balance is better if she has support. She has a Rolator walker but cannot easily fold it up and put it in the back seat. She has had this  for many years.  Reviewed medications and discussed potential side effects of medications such as dizziness and frequent urination Advised patient of importance of notifying provider of falls.  Assessed for signs and symptoms of orthostatic hypotension Assessed for falls since last encounter. 06-09-2021: Fall with injury in November. No new falls since talking to the LCSW in February. Review and education given.  Assessed patients knowledge of fall risk prevention secondary to previously provided education. 06-09-2021: The patient is being careful and monitoring her position changes and directional changes. Denies any acute findings today. Education and support given.  Advised patient to discuss new falls or questions and concerns with balance issues, safety with provider    Patient Goals/Self-Care Activities: Take medications as prescribed   Attend all scheduled provider appointments Call pharmacy for medication refills 3-7 days in advance of running out of medications Attend church or other social activities Perform all self care activities independently  Perform IADL's (shopping, preparing meals, housekeeping, managing finances) independently Call provider office for new concerns or questions  Work with the social worker to address care coordination needs and will continue to work with the clinical team to address health care and disease management related needs call the Suicide and Crisis Lifeline: 988 call the [place]  National Suicide Prevention Lifeline: 8704855948 or TTY: 828-350-7508 TTY 5873675597) to talk to a trained counselor call 1-800-273-TALK (toll free, 24 hour hotline) if experiencing a Mental Health or [place]  check blood pressure 3 times per week choose a place to take my blood pressure (home, clinic or office, retail store) write blood pressure results in a log or diary learn about high blood pressure keep a blood pressure log take blood pressure log to  all doctor appointments call doctor for signs and symptoms of high blood pressure develop an action plan for high blood pressure keep all doctor appointments take medications for blood pressure exactly as prescribed begin an exercise program report new symptoms to your doctor eat more whole grains, fruits and vegetables, lean meats and healthy fats - call for medicine refill 2 or 3 days before it runs out - take all medications exactly as prescribed - call doctor with any symptoms you believe are related to your medicine - call doctor when you experience any new symptoms - go to all doctor appointments as scheduled - adhere to prescribed diet: Heart Healthy diet    Our next appointment is by telephone on 08-11-2021 at 0945 am  Please call the care guide team at [phone] if you need to cancel or reschedule your appointment.   If you are experiencing a Mental Health or [place] or need someone to talk to, please call the Suicide and Crisis Lifeline: 988 call the [place] National Suicide Prevention Lifeline: 8784706765 or TTY: 6171097950 TTY 6394846816) to talk to a trained counselor call 1-800-273-TALK (toll free, 24 hour hotline)   Patient verbalizes understanding of instructions and care plan provided today and agrees to view in [place]. Active MyChart status confirmed with patient.    Noreene Larsson RN, MSN, [place] Mobile: [phone]

## 2021-06-09 NOTE — Telephone Encounter (Signed)
?  Care Management  ? ?Follow Up Note ? ? ?06/09/2021 ?Name: April Price MRN: [id number] DOB: [birth year] ? ? ?Referred by: Jearld Fenton, NP ?Reason for referral : Chronic Care Management (RNCM: Call made back to the patient to let the patient know to schedule an AWV with pcp to discuss DME and other needs ) ? ? ?Call made back to the patient to let her know to schedule an AWV with the pcp to discuss DME request of a walker with front wheels. The patient verbalized understanding. Will continue to monitor for changes.  ? ?Follow Up Plan: Telephone follow up appointment with care management team member scheduled for: 08-11-2021 at [place] am ?Noreene Larsson RN, MSN, CCM ?Community Care Coordinator ?[facility] ?[facility] ?Mobile: [phone]  ?

## 2021-06-13 ENCOUNTER — Other Ambulatory Visit: Payer: Self-pay

## 2021-06-13 ENCOUNTER — Ambulatory Visit (INDEPENDENT_AMBULATORY_CARE_PROVIDER_SITE_OTHER): Payer: PPO

## 2021-06-13 DIAGNOSIS — Z0000 Encounter for general adult medical examination without abnormal findings: Secondary | ICD-10-CM | POA: Diagnosis not present

## 2021-06-13 NOTE — Progress Notes (Signed)
Virtual Visit via Telephone Note  I connected with [name] on 06/13/21 at  2:00 PM EST by telephone and verified that I am speaking with the correct person using two identifiers.  Location: Patient:  April Price Provider: Webb Silversmith, NP      I provided 40 minutes of non-face-to-face time during this encounter.   Wilson Singer, CMA   HPI:  Patient presents to clinic today for their subsequent annual Medicare wellness exam.  Past Medical History:  Diagnosis Date   Allergy    Anxiety    Arthritis    Asthma    Cataract    surgery both eyes in 2011   Chronic fatigue    Depression    Depression screen    Diffuse cystic mastopathy    Fibromyalgia    Fibromyalgia muscle pain    GERD (gastroesophageal reflux disease)    Heart murmur    History of kidney stones    Hyperlipidemia 11/21/2019   Hypertension    driven by stress and pain   Stage 3a chronic kidney disease ([place]) 01/27/2021    Current Outpatient Medications  Medication Sig Dispense Refill   albuterol (PROVENTIL HFA;VENTOLIN HFA) 108 (90 Base) MCG/ACT inhaler Inhale 2 puffs into the lungs every 4 (four) hours as needed for wheezing or shortness of breath. 3 Inhaler 1   fluticasone (FLONASE) 50 MCG/ACT nasal spray Place 2 sprays into both nostrils daily. 16 g 6   lisinopril (ZESTRIL) 5 MG tablet Take 1 tablet (5 mg total) by mouth daily. 90 tablet 1   montelukast (SINGULAIR) 10 MG tablet Take 10 mg by mouth at bedtime.     Multiple Vitamins-Minerals (PRESERVISION AREDS 2 PO) Take by mouth.     tiZANidine (ZANAFLEX) 4 MG tablet TAKE ONE TABLET (4 MG) BY MOUTH EVERY 8 HOURS AS NEEDED FOR NUSCLE SPASMS. 270 tablet 0   traMADol (ULTRAM) 50 MG tablet Take 50 mg by mouth every 8 (eight) hours as needed.     amLODipine (NORVASC) 2.5 MG tablet Take 1 tablet (2.5 mg total) by mouth 2 (two) times daily. (Patient not taking: Reported on 06/13/2021) 30 tablet 0   azelastine (ASTELIN) 0.1 % nasal spray Place 2  sprays into both nostrils 2 (two) times daily as needed for rhinitis. Use in each nostril as directed (Patient not taking: Reported on 06/13/2021) 30 mL 5   EPINEPHrine (EPIPEN 2-PAK) 0.3 mg/0.3 mL IJ SOAJ injection Inject 0.3 mLs (0.3 mg total) into the muscle once for 1 dose. 0.3 mL 0   esomeprazole (NEXIUM) 40 MG capsule TAKE 1 CAPSULE BY MOUTH EVERY DAY (Patient not taking: Reported on 06/13/2021) 90 capsule 2   lovastatin (MEVACOR) 10 MG tablet Take 1 tablet (10 mg total) by mouth every other day. (Patient not taking: Reported on 06/13/2021) 30 tablet 0   No current facility-administered medications for this visit.    Allergies  Allergen Reactions   Penicillins Anaphylaxis    Has patient had a PCN reaction causing immediate rash, facial/tongue/throat swelling, SOB or lightheadedness with hypotension: Yes Has patient had a PCN reaction causing severe rash involving mucus membranes or skin necrosis: No Has patient had a PCN reaction that required hospitalization No Has patient had a PCN reaction occurring within the last 10 years: Yes If all of the above answers are "NO", then may proceed with Cephalosporin use.    Tylenol [Acetaminophen] Anaphylaxis, Itching and Hives    Ears and throat itch    Armoracia Rusticana Ext (  Horseradish)    Morphine And Related Other (See Comments)    Depression    Nickel Itching   Pepcid [Famotidine] Nausea And Vomiting   Shrimp [Shellfish Allergy] Hives   Tape     irritation   Tetanus Toxoids Other (See Comments)    Swelling at injection site and fever   Wheat Bran    Corn-Containing Products     Has hay fever and swelling in fingers.   Gabapentin Other (See Comments)    Makes her feel "spacy" off balance, like she is coming out of anesthesia.     Family History  Problem Relation Age of Onset   Heart disease Mother    Arthritis Mother    Vision loss Mother    Heart disease Father    Stroke Father    Vascular Disease Sister    Heart disease  Brother     Social History   Socioeconomic History   Marital status: Single    Spouse name: Not on file   Number of children: 0   Years of education: Not on file   Highest education level: Some college, no degree  Occupational History   Occupation: Disability  Tobacco Use   Smoking status: Every Day    Packs/day: 0.50    Years: 40.00    Pack years: 20.00    Types: Cigarettes   Smokeless tobacco: Never  Vaping Use   Vaping Use: Never used  Substance and Sexual Activity   Alcohol use: Never   Drug use: Never   Sexual activity: Not Currently    Birth control/protection: None  Other Topics Concern   Not on file  Social History Narrative   Difficulty sitting, standing for prolong periods of time.    Social Determinants of Health   Financial Resource Strain: Medium Risk   Difficulty of Paying Living Expenses: Somewhat hard  Food Insecurity: No Food Insecurity   Worried About Charity fundraiser in the Last Year: Never true   Ran Out of Food in the Last Year: Never true  Transportation Needs: No Transportation Needs   Lack of Transportation (Medical): No   Lack of Transportation (Non-Medical): No  Physical Activity: Insufficiently Active   Days of Exercise per Week: 2 days   Minutes of Exercise per Session: 30 min  Stress: No Stress Concern Present   Feeling of Stress : Only a little  Social Connections: Unknown   Frequency of Communication with Friends and Family: More than three times a week   Frequency of Social Gatherings with Friends and Family: Once a week   Attends Religious Services: More than 4 times per year   Active Member of [employer] or Organizations: Yes   Attends Music therapist: More than 4 times per year   Marital Status: Patient refused  Intimate Partner Violence: Not At Risk   Fear of Current or Ex-Partner: No   Emotionally Abused: No   Physically Abused: No   Sexually Abused: No    Hospitiliaztions: No hospital visit over the past 12  mths.   Health Maintenance:    Flu: 01/27/21  Tetanus: Allergic   Pneumovax: 04/10/2008  Prevnar: Due   Shingrix: Pt aware she can get it at Pharm.  Covid: 06/04/19, 05/14/2019  Mammogram: 03/18/21  Pap Smear: Total Hysterectomy   Bone Density: 03/17/20, Abnormal  Colon Screening: 02/09/20 Abnormal  Eye Doctor:  01/13/2021  Dental Exam: Declined    Providers:   PCP: Webb Silversmith, NP  Pain Management: Waunita Schooner  Davy Pique, MD I have personally reviewed and have noted:  1. The patient's medical and social history 2. Their use of alcohol, tobacco or illicit drugs 3. Their current medications and supplements 4. The patient's functional ability including ADL's, fall risks, home safety risks and hearing or visual impairment. 5. Diet and physical activities 6. Evidence for depression or mood disorder  Subjective:   Review of Systems:   Constitutional: Denies fever, malaise, fatigue, headache or abrupt weight changes.  HEENT: Denies eye pain, eye redness, ear pain, ringing in the ears, wax buildup, runny nose, nasal congestion, bloody nose, or sore throat. Respiratory: Denies difficulty breathing, shortness of breath, cough or sputum production.   Cardiovascular: Denies chest pain, chest tightness, palpitations or swelling in the hands or feet.  Gastrointestinal: Denies abdominal pain, bloating, constipation, diarrhea or blood in the stool.  GU: Denies urgency, frequency, pain with urination, burning sensation, blood in urine, odor or discharge. Musculoskeletal: Positive decrease in range of motion, difficulty with gait, muscle pain or joint pain and swelling.(Pain Management)  Skin: Denies redness, rashes, lesions or ulcercations.  Neurological: Denies dizziness, difficulty with memory, difficulty with speech or problems with balance and coordination.  Psych: Denies anxiety, depression, SI/HI.  No other specific complaints in a complete review of systems (except as listed in HPI  above).  Objective:  PE:   There were no vitals taken for this visit. Wt Readings from Last 3 Encounters:  02/10/21 156 lb 9.6 oz (71 kg)  01/27/21 158 lb 9.6 oz (71.9 kg)  06/01/20 170 lb (77.1 kg)     BMET    Component Value Date/Time   NA 138 01/27/2021 1409   K 4.6 01/27/2021 1409   CL 107 01/27/2021 1409   CO2 21 01/27/2021 1409   GLUCOSE 92 01/27/2021 1409   BUN 20 01/27/2021 1409   CREATININE 1.39 (H) 01/27/2021 1409   CALCIUM 9.6 01/27/2021 1409   GFRNONAA 50 (L) 11/21/2019 0829   GFRAA 57 (L) 11/21/2019 0829    Lipid Panel     Component Value Date/Time   CHOL 243 (H) 01/27/2021 1409   TRIG 107 01/27/2021 1409   HDL 50 01/27/2021 1409   CHOLHDL 4.9 01/27/2021 1409   LDLCALC 170 (H) 01/27/2021 1409    CBC    Component Value Date/Time   WBC 9.1 01/27/2021 1409   RBC 5.30 (H) 01/27/2021 1409   HGB 17.1 (H) 01/27/2021 1409   HCT 49.7 (H) 01/27/2021 1409   PLT 272 01/27/2021 1409   MCV 93.8 01/27/2021 1409   MCH 32.3 01/27/2021 1409   MCHC 34.4 01/27/2021 1409   RDW 12.2 01/27/2021 1409   LYMPHSABS 2,539 01/27/2021 1409   MONOABS 0.6 06/01/2016 0901   EOSABS 137 01/27/2021 1409   BASOSABS 73 01/27/2021 1409    Hgb A1C Lab Results  Component Value Date   HGBA1C 5.0 01/27/2021      Assessment and Plan:   Medicare Annual Wellness Visit:  Diet: Heart healthy  Physical activity: Sedentary Depression/mood screen: Positive Flowsheet Row Clinical Support from 06/13/2021 in [place]  PHQ-9 Total Score 6      Hearing: Intact to whispered voice Visual acuity Annual eye exam  ADLs: Capable with Assistive device  Fall risk: Positive Home safety: Good Cognitive evaluation:  6CIT Screen 06/13/2021 06/01/2020 05/21/2018 02/13/2017  What Year? 0 points 0 points 0 points 0 points  What month? 0 points 0 points 0 points 0 points  What time? 0 points 0 points  0 points 0 points  Count back from 20 0 points 0 points 0 points 0 points   Months in reverse 0 points 0 points 0 points 0 points  Repeat phrase 0 points 0 points 0 points 0 points  Total Score 0 0 0 0     EOL planning: No Adv directives, full code  Nurse's Notes: The pt had a positive Cologuard 02/09/2020. She declines a colonoscopy or any colon screening of any kind. She contribute the rectal bleeding to her hemorrhoids.   Next appointment: No upcoming appt scheduled   Wilson Singer, CMA

## 2021-06-28 NOTE — Progress Notes (Signed)
Medicare wellness performed by CMA, note reviewed. ? ?April Silversmith, NP ? ?

## 2021-07-08 DIAGNOSIS — E785 Hyperlipidemia, unspecified: Secondary | ICD-10-CM | POA: Diagnosis not present

## 2021-07-08 DIAGNOSIS — F32A Depression, unspecified: Secondary | ICD-10-CM

## 2021-07-08 DIAGNOSIS — I10 Essential (primary) hypertension: Secondary | ICD-10-CM

## 2021-07-12 DIAGNOSIS — M461 Sacroiliitis, not elsewhere classified: Secondary | ICD-10-CM | POA: Diagnosis not present

## 2021-07-28 ENCOUNTER — Ambulatory Visit (INDEPENDENT_AMBULATORY_CARE_PROVIDER_SITE_OTHER): Payer: PPO | Admitting: Internal Medicine

## 2021-07-28 ENCOUNTER — Encounter: Payer: Self-pay | Admitting: Internal Medicine

## 2021-07-28 VITALS — BP 124/76 | HR 82 | Temp 97.8°F | Ht 63.0 in | Wt 149.0 lb

## 2021-07-28 DIAGNOSIS — E663 Overweight: Secondary | ICD-10-CM

## 2021-07-28 DIAGNOSIS — D45 Polycythemia vera: Secondary | ICD-10-CM | POA: Diagnosis not present

## 2021-07-28 DIAGNOSIS — R2689 Other abnormalities of gait and mobility: Secondary | ICD-10-CM | POA: Diagnosis not present

## 2021-07-28 DIAGNOSIS — Z23 Encounter for immunization: Secondary | ICD-10-CM

## 2021-07-28 DIAGNOSIS — Z0001 Encounter for general adult medical examination with abnormal findings: Secondary | ICD-10-CM

## 2021-07-28 DIAGNOSIS — E785 Hyperlipidemia, unspecified: Secondary | ICD-10-CM | POA: Diagnosis not present

## 2021-07-28 DIAGNOSIS — Z6826 Body mass index (BMI) 26.0-26.9, adult: Secondary | ICD-10-CM

## 2021-07-28 MED ORDER — TIZANIDINE HCL 4 MG PO TABS: ORAL_TABLET | ORAL | 0 refills | Status: DC

## 2021-07-28 MED ORDER — ESOMEPRAZOLE MAGNESIUM 40 MG PO CPDR: DELAYED_RELEASE_CAPSULE | ORAL | 1 refills | Status: DC

## 2021-07-28 MED ORDER — LISINOPRIL 5 MG PO TABS: 5.0000 mg | ORAL_TABLET | Freq: Every day | ORAL | 1 refills | Status: DC

## 2021-07-28 NOTE — Assessment & Plan Note (Signed)
Encourage diet and exercise for weight loss 

## 2021-07-28 NOTE — Progress Notes (Signed)
? ?Subjective:  ? ? Patient ID: April Price, female    DOB: [birth year], 66 y.o.   MRN: [id number] ? ?HPI ? ?Patient presents to clinic today for her annual exam. ? ?Flu: 01/2021 ?Tetanus: unsure ?COVID: Pfizer x4 ?Pneumovax: 04/2008 ?Prevnar: never ?Shingrix: x1 [place] (closed) ?Pap smear: Hysterectomy ?Mammogram: 03/2021 ?Bone density: 03/2020 ?Colon screening: 02/2021, positive, refusing colonoscopy ?Vision screening: annually ?Dentist: as needed ? ?Diet: She does eat some meat. She consumes fruits and veggies. She tries to avoid fried foods. She drinks mostly water, coffee, lemonade, hot tea. ?Exercise: None ? ?Review of Systems ? ?   ?Past Medical History:  ?Diagnosis Date  ? Allergy   ? Anxiety   ? Arthritis   ? Asthma   ? Cataract   ? surgery both eyes in 2011  ? Chronic fatigue   ? Depression   ? Depression screen   ? Diffuse cystic mastopathy   ? Fibromyalgia   ? Fibromyalgia muscle pain   ? GERD (gastroesophageal reflux disease)   ? Heart murmur   ? History of kidney stones   ? Hyperlipidemia 11/21/2019  ? Hypertension   ? driven by stress and pain  ? Stage 3a chronic kidney disease ([place]) 01/27/2021  ? ? ?Current Outpatient Medications  ?Medication Sig Dispense Refill  ? albuterol (PROVENTIL HFA;VENTOLIN HFA) 108 (90 Base) MCG/ACT inhaler Inhale 2 puffs into the lungs every 4 (four) hours as needed for wheezing or shortness of breath. 3 Inhaler 1  ? amLODipine (NORVASC) 2.5 MG tablet Take 1 tablet (2.5 mg total) by mouth 2 (two) times daily. (Patient not taking: Reported on 06/13/2021) 30 tablet 0  ? azelastine (ASTELIN) 0.1 % nasal spray Place 2 sprays into both nostrils 2 (two) times daily as needed for rhinitis. Use in each nostril as directed (Patient not taking: Reported on 06/13/2021) 30 mL 5  ? EPINEPHrine (EPIPEN 2-PAK) 0.3 mg/0.3 mL IJ SOAJ injection Inject 0.3 mLs (0.3 mg total) into the muscle once for 1 dose. 0.3 mL 0  ? esomeprazole (NEXIUM) 40 MG capsule TAKE 1 CAPSULE BY MOUTH  EVERY DAY (Patient not taking: Reported on 06/13/2021) 90 capsule 2  ? fluticasone (FLONASE) 50 MCG/ACT nasal spray Place 2 sprays into both nostrils daily. 16 g 6  ? lisinopril (ZESTRIL) 5 MG tablet Take 1 tablet (5 mg total) by mouth daily. 90 tablet 1  ? lovastatin (MEVACOR) 10 MG tablet Take 1 tablet (10 mg total) by mouth every other day. (Patient not taking: Reported on 06/13/2021) 30 tablet 0  ? montelukast (SINGULAIR) 10 MG tablet Take 10 mg by mouth at bedtime.    ? Multiple Vitamins-Minerals (PRESERVISION AREDS 2 PO) Take by mouth.    ? tiZANidine (ZANAFLEX) 4 MG tablet TAKE ONE TABLET (4 MG) BY MOUTH EVERY 8 HOURS AS NEEDED FOR NUSCLE SPASMS. 270 tablet 0  ? traMADol (ULTRAM) 50 MG tablet Take 50 mg by mouth every 8 (eight) hours as needed.    ? ?No current facility-administered medications for this visit.  ? ? ?Allergies  ?Allergen Reactions  ? Penicillins Anaphylaxis  ?  Has patient had a PCN reaction causing immediate rash, facial/tongue/throat swelling, SOB or lightheadedness with hypotension: Yes ?Has patient had a PCN reaction causing severe rash involving mucus membranes or skin necrosis: No ?Has patient had a PCN reaction that required hospitalization No ?Has patient had a PCN reaction occurring within the last 10 years: Yes ?If all of the above answers are "NO", then may proceed  with Cephalosporin use. ?  ? Tylenol [Acetaminophen] Anaphylaxis, Itching and Hives  ?  Ears and throat itch   ? Armoracia Rusticana Ext (Horseradish)   ? Morphine And Related Other (See Comments)  ?  Depression   ? Nickel Itching  ? Pepcid [Famotidine] Nausea And Vomiting  ? Shrimp [Shellfish Allergy] Hives  ? Tape   ?  irritation  ? Tetanus Toxoids Other (See Comments)  ?  Swelling at injection site and fever  ? Wheat Bran   ? Corn-Containing Products   ?  Has hay fever and swelling in fingers.  ? Gabapentin Other (See Comments)  ?  Makes her feel "spacy" off balance, like she is coming out of anesthesia.   ? ? ?Family  History  ?Problem Relation Age of Onset  ? Heart disease Mother   ? Arthritis Mother   ? Vision loss Mother   ? Heart disease Father   ? Stroke Father   ? Vascular Disease Sister   ? Heart disease Brother   ? ? ?Social History  ? ?Socioeconomic History  ? Marital status: Single  ?  Spouse name: Not on file  ? Number of children: 0  ? Years of education: Not on file  ? Highest education level: Some college, no degree  ?Occupational History  ? Occupation: Disability  ?Tobacco Use  ? Smoking status: Every Day  ?  Packs/day: 0.50  ?  Years: 40.00  ?  Pack years: 20.00  ?  Types: Cigarettes  ? Smokeless tobacco: Never  ?Vaping Use  ? Vaping Use: Never used  ?Substance and Sexual Activity  ? Alcohol use: Never  ? Drug use: Never  ? Sexual activity: Not Currently  ?  Birth control/protection: None  ?Other Topics Concern  ? Not on file  ?Social History Narrative  ? Difficulty sitting, standing for prolong periods of time.   ? ?Social Determinants of Health  ? ?Financial Resource Strain: Medium Risk  ? Difficulty of Paying Living Expenses: Somewhat hard  ?Food Insecurity: No Food Insecurity  ? Worried About Charity fundraiser in the Last Year: Never true  ? Ran Out of Food in the Last Year: Never true  ?Transportation Needs: No Transportation Needs  ? Lack of Transportation (Medical): No  ? Lack of Transportation (Non-Medical): No  ?Physical Activity: Insufficiently Active  ? Days of Exercise per Week: 2 days  ? Minutes of Exercise per Session: 30 min  ?Stress: No Stress Concern Present  ? Feeling of Stress : Only a little  ?Social Connections: Unknown  ? Frequency of Communication with Friends and Family: More than three times a week  ? Frequency of Social Gatherings with Friends and Family: Once a week  ? Attends Religious Services: More than 4 times per year  ? Active Member of Clubs or Organizations: Yes  ? Attends Archivist Meetings: More than 4 times per year  ? Marital Status: Patient refused  ?Intimate  Partner Violence: Not At Risk  ? Fear of Current or Ex-Partner: No  ? Emotionally Abused: No  ? Physically Abused: No  ? Sexually Abused: No  ? ? ? ?Constitutional: Patient reports chronic fatigue.  Denies fever, malaise, headache or abrupt weight changes.  ?HEENT: Denies eye pain, eye redness, ear pain, ringing in the ears, wax buildup, runny nose, nasal congestion, bloody nose, or sore throat. ?Respiratory: Denies difficulty breathing, shortness of breath, cough or sputum production.   ?Cardiovascular: Denies chest pain, chest tightness, palpitations or  swelling in the hands or feet.  ?Gastrointestinal: Denies abdominal pain, bloating, constipation, diarrhea or blood in the stool.  ?GU: Denies urgency, frequency, pain with urination, burning sensation, blood in urine, odor or discharge. ?Musculoskeletal: Patient reports chronic muscle and joint pain.  Denies decrease in range of motion, difficulty with gait, or joint swelling.  ?Skin: Denies redness, rashes, lesions or ulcercations.  ?Neurological: Pt reports difficulty with balance. Denies dizziness, difficulty with memory, difficulty with speech or problems with coordination.  ?Psych: Patient has a history of depression.  Denies anxiety, SI/HI. ? ?No other specific complaints in a complete review of systems (except as listed in HPI above). ? ?Objective:  ? Physical Exam ? ?BP 124/76 (BP Location: Left Arm, Patient Position: Sitting, Cuff Size: Normal)   Pulse 82   Temp 97.8 ?F (36.6 ?C) (Temporal)   Ht '5\' 3"'$  (1.6 m)   Wt 149 lb (67.6 kg)   SpO2 98%   BMI 26.39 kg/m?  ? ?Wt Readings from Last 3 Encounters:  ?02/10/21 156 lb 9.6 oz (71 kg)  ?01/27/21 158 lb 9.6 oz (71.9 kg)  ?06/01/20 170 lb (77.1 kg)  ? ? ?General: Appears her stated age, overweight, in NAD. ?Skin: Warm, dry and intact.  ?HEENT: Head: normal shape and size; Eyes: sclera white, no icterus, conjunctiva pink, PERRLA and EOMs intact;  ?Neck:  Neck supple, trachea midline. No masses, lumps or  thyromegaly present.  ?Cardiovascular: Normal rate and rhythm. S1,S2 noted.  No murmur, rubs or gallops noted. No JVD or BLE edema. No carotid bruits noted. ?Pulmonary/Chest: Normal effort and positive vesicular bre

## 2021-07-28 NOTE — Patient Instructions (Signed)
Health Maintenance for Postmenopausal Women Menopause is a normal process in which your ability to get pregnant comes to an end. This process happens slowly over many months or years, usually between the ages of 48 and 55. Menopause is complete when you have missed your menstrual period for 12 months. It is important to talk with your health care provider about some of the most common conditions that affect women after menopause (postmenopausal women). These include heart disease, cancer, and bone loss (osteoporosis). Adopting a healthy lifestyle and getting preventive care can help to promote your health and wellness. The actions you take can also lower your chances of developing some of these common conditions. What are the signs and symptoms of menopause? During menopause, you may have the following symptoms: Hot flashes. These can be moderate or severe. Night sweats. Decrease in sex drive. Mood swings. Headaches. Tiredness (fatigue). Irritability. Memory problems. Problems falling asleep or staying asleep. Talk with your health care provider about treatment options for your symptoms. Do I need hormone replacement therapy? Hormone replacement therapy is effective in treating symptoms that are caused by menopause, such as hot flashes and night sweats. Hormone replacement carries certain risks, especially as you become older. If you are thinking about using estrogen or estrogen with progestin, discuss the benefits and risks with your health care provider. How can I reduce my risk for heart disease and stroke? The risk of heart disease, heart attack, and stroke increases as you age. One of the causes may be a change in the body's hormones during menopause. This can affect how your body uses dietary fats, triglycerides, and cholesterol. Heart attack and stroke are medical emergencies. There are many things that you can do to help prevent heart disease and stroke. Watch your blood pressure High  blood pressure causes heart disease and increases the risk of stroke. This is more likely to develop in people who have high blood pressure readings or are overweight. Have your blood pressure checked: Every 3-5 years if you are 18-39 years of age. Every year if you are 40 years old or older. Eat a healthy diet  Eat a diet that includes plenty of vegetables, fruits, low-fat dairy products, and lean protein. Do not eat a lot of foods that are high in solid fats, added sugars, or sodium. Get regular exercise Get regular exercise. This is one of the most important things you can do for your health. Most adults should: Try to exercise for at least 150 minutes each week. The exercise should increase your heart rate and make you sweat (moderate-intensity exercise). Try to do strengthening exercises at least twice each week. Do these in addition to the moderate-intensity exercise. Spend less time sitting. Even light physical activity can be beneficial. Other tips Work with your health care provider to achieve or maintain a healthy weight. Do not use any products that contain nicotine or tobacco. These products include cigarettes, chewing tobacco, and vaping devices, such as e-cigarettes. If you need help quitting, ask your health care provider. Know your numbers. Ask your health care provider to check your cholesterol and your blood sugar (glucose). Continue to have your blood tested as directed by your health care provider. Do I need screening for cancer? Depending on your health history and family history, you may need to have cancer screenings at different stages of your life. This may include screening for: Breast cancer. Cervical cancer. Lung cancer. Colorectal cancer. What is my risk for osteoporosis? After menopause, you may be   at increased risk for osteoporosis. Osteoporosis is a condition in which bone destruction happens more quickly than new bone creation. To help prevent osteoporosis or  the bone fractures that can happen because of osteoporosis, you may take the following actions: If you are 19-50 years old, get at least 1,000 mg of calcium and at least 600 international units (IU) of vitamin D per day. If you are older than age 50 but younger than age 70, get at least 1,200 mg of calcium and at least 600 international units (IU) of vitamin D per day. If you are older than age 70, get at least 1,200 mg of calcium and at least 800 international units (IU) of vitamin D per day. Smoking and drinking excessive alcohol increase the risk of osteoporosis. Eat foods that are rich in calcium and vitamin D, and do weight-bearing exercises several times each week as directed by your health care provider. How does menopause affect my mental health? Depression may occur at any age, but it is more common as you become older. Common symptoms of depression include: Feeling depressed. Changes in sleep patterns. Changes in appetite or eating patterns. Feeling an overall lack of motivation or enjoyment of activities that you previously enjoyed. Frequent crying spells. Talk with your health care provider if you think that you are experiencing any of these symptoms. General instructions See your health care provider for regular wellness exams and vaccines. This may include: Scheduling regular health, dental, and eye exams. Getting and maintaining your vaccines. These include: Influenza vaccine. Get this vaccine each year before the flu season begins. Pneumonia vaccine. Shingles vaccine. Tetanus, diphtheria, and pertussis (Tdap) booster vaccine. Your health care provider may also recommend other immunizations. Tell your health care provider if you have ever been abused or do not feel safe at home. Summary Menopause is a normal process in which your ability to get pregnant comes to an end. This condition causes hot flashes, night sweats, decreased interest in sex, mood swings, headaches, or lack  of sleep. Treatment for this condition may include hormone replacement therapy. Take actions to keep yourself healthy, including exercising regularly, eating a healthy diet, watching your weight, and checking your blood pressure and blood sugar levels. Get screened for cancer and depression. Make sure that you are up to date with all your vaccines. This information is not intended to replace advice given to you by your health care provider. Make sure you discuss any questions you have with your health care provider. Document Revised: 08/16/2020 Document Reviewed: 08/16/2020 Elsevier Patient Education  2023 Elsevier Inc.  

## 2021-07-29 LAB — COMPLETE METABOLIC PANEL WITH GFR
AG Ratio: 1.3 (calc) (ref 1.0–2.5)
ALT: 7 U/L (ref 6–29)
AST: 8 U/L — ABNORMAL LOW (ref 10–35)
Albumin: 3.7 g/dL (ref 3.6–5.1)
Alkaline phosphatase (APISO): 68 U/L (ref 37–153)
BUN/Creatinine Ratio: 21 (calc) (ref 6–22)
BUN: 28 mg/dL — ABNORMAL HIGH (ref 7–25)
CO2: 20 mmol/L (ref 20–32)
Calcium: 9.6 mg/dL (ref 8.6–10.4)
Chloride: 106 mmol/L (ref 98–110)
Creat: 1.33 mg/dL — ABNORMAL HIGH (ref 0.50–1.05)
Globulin: 2.8 g/dL (calc) (ref 1.9–3.7)
Glucose, Bld: 83 mg/dL (ref 65–99)
Potassium: 5.1 mmol/L (ref 3.5–5.3)
Sodium: 135 mmol/L (ref 135–146)
Total Bilirubin: 0.4 mg/dL (ref 0.2–1.2)
Total Protein: 6.5 g/dL (ref 6.1–8.1)
eGFR: 44 mL/min/{1.73_m2} — ABNORMAL LOW (ref >=60)

## 2021-07-29 LAB — CBC
HCT: 47.8 % — ABNORMAL HIGH (ref 35.0–45.0)
Hemoglobin: 16.1 g/dL — ABNORMAL HIGH (ref 11.7–15.5)
MCH: 31.7 pg (ref 27.0–33.0)
MCHC: 33.7 g/dL (ref 32.0–36.0)
MCV: 94.1 fL (ref 80.0–100.0)
MPV: 10.1 fL (ref 7.5–12.5)
Platelets: 244 10*3/uL (ref 140–400)
RBC: 5.08 10*6/uL (ref 3.80–5.10)
RDW: 12 % (ref 11.0–15.0)
WBC: 10.5 10*3/uL (ref 3.8–10.8)

## 2021-07-29 LAB — LIPID PANEL
Cholesterol: 208 mg/dL — ABNORMAL HIGH (ref <200)
HDL: 57 mg/dL (ref >=50)
LDL Cholesterol (Calc): 125 mg/dL (calc) — ABNORMAL HIGH
Non-HDL Cholesterol (Calc): 151 mg/dL (calc) — ABNORMAL HIGH (ref <130)
Total CHOL/HDL Ratio: 3.6 (calc) (ref <5.0)
Triglycerides: 147 mg/dL (ref <150)

## 2021-08-01 DIAGNOSIS — Z961 Presence of intraocular lens: Secondary | ICD-10-CM | POA: Diagnosis not present

## 2021-08-01 DIAGNOSIS — H353131 Nonexudative age-related macular degeneration, bilateral, early dry stage: Secondary | ICD-10-CM | POA: Diagnosis not present

## 2021-08-02 ENCOUNTER — Ambulatory Visit (INDEPENDENT_AMBULATORY_CARE_PROVIDER_SITE_OTHER): Payer: PPO | Admitting: Licensed Clinical Social Worker

## 2021-08-02 DIAGNOSIS — G894 Chronic pain syndrome: Secondary | ICD-10-CM

## 2021-08-02 DIAGNOSIS — F321 Major depressive disorder, single episode, moderate: Secondary | ICD-10-CM

## 2021-08-02 DIAGNOSIS — M797 Fibromyalgia: Secondary | ICD-10-CM

## 2021-08-02 DIAGNOSIS — M810 Age-related osteoporosis without current pathological fracture: Secondary | ICD-10-CM

## 2021-08-02 DIAGNOSIS — N1831 Chronic kidney disease, stage 3a: Secondary | ICD-10-CM

## 2021-08-02 DIAGNOSIS — I10 Essential (primary) hypertension: Secondary | ICD-10-CM

## 2021-08-02 NOTE — Patient Instructions (Signed)
Visit Information ? ?Thank you for taking time to visit with me today. Please don't hesitate to contact me if I can be of assistance to you before our next scheduled telephone appointment. ? ?Following are the goals we discussed today:  ?Patient Self Care Activities:  ?Attend all scheduled appointments with providers ?Continue to comply with medication management ?Continue utilizing strategies discussed to assist with management of symptoms ? ?Our next appointment is by telephone on 10/04/21 at 10:45 AM ? ?Please call the care guide team at [phone] if you need to cancel or reschedule your appointment.  ? ?If you are experiencing a Mental Health or [place] or need someone to talk to, please call 911  ? ?Patient verbalizes understanding of instructions and care plan provided today and agrees to view in [place]. Active MyChart status confirmed with patient.   ? ?Christa See, MSW, LCSW ?[facility] Care Management ?[place] [facility] ?[email or web address] ?Phone [phone] ?5:05 PM ?  ?

## 2021-08-02 NOTE — Chronic Care Management (AMB) (Signed)
Care Management ?Clinical Social Work Note ? ?08/02/2021 ?Name: April Price MRN: [id number] DOB: [birth year] ? ?April Price is a 66 y.o. year old female who is a primary care patient of April Fenton, NP.  The Care Management team was consulted for assistance with chronic disease management and coordination needs. ? ?Engaged with patient by telephone for follow up visit in response to provider referral for social work chronic care management and care coordination services ? ?Consent to Services:  ?April Price was given information about Care Management services today including:  ?Care Management services includes personalized support from designated clinical staff supervised by her physician, including individualized plan of care and coordination with other care providers ?24/7 contact phone numbers for assistance for urgent and routine care needs. ?The patient may stop case management services at any time by phone call to the office staff. ? ?Patient agreed to services and consent obtained.  ? ?Assessment: Review of patient past medical history, allergies, medications, and health status, including review of relevant consultants reports was performed today as part of a comprehensive evaluation and provision of chronic care management and care coordination services. ? ?SDOH (Social Determinants of Health) assessments and interventions performed:   ? ?Advanced Directives Status: Not addressed in this encounter. ? ?Care Plan ? ?Allergies  ?Allergen Reactions  ? Penicillins Anaphylaxis  ?  Has patient had a PCN reaction causing immediate rash, facial/tongue/throat swelling, SOB or lightheadedness with hypotension: Yes ?Has patient had a PCN reaction causing severe rash involving mucus membranes or skin necrosis: No ?Has patient had a PCN reaction that required hospitalization No ?Has patient had a PCN reaction occurring within the last 10 years: Yes ?If all of the above answers are "NO", then may  proceed with Cephalosporin use. ?  ? Tylenol [Acetaminophen] Anaphylaxis, Itching and Hives  ?  Ears and throat itch   ? Armoracia Rusticana Ext (Horseradish)   ? Morphine And Related Other (See Comments)  ?  Depression   ? Nickel Itching  ? Pepcid [Famotidine] Nausea And Vomiting  ? Shrimp [Shellfish Allergy] Hives  ? Tape   ?  irritation  ? Tetanus Toxoids Other (See Comments)  ?  Swelling at injection site and fever  ? Wheat Bran   ? Corn-Containing Products   ?  Has hay fever and swelling in fingers.  ? Gabapentin Other (See Comments)  ?  Makes her feel "spacy" off balance, like she is coming out of anesthesia.   ? ? ?Outpatient Encounter Medications as of 08/02/2021  ?Medication Sig  ? albuterol (PROVENTIL HFA;VENTOLIN HFA) 108 (90 Base) MCG/ACT inhaler Inhale 2 puffs into the lungs every 4 (four) hours as needed for wheezing or shortness of breath.  ? azelastine (ASTELIN) 0.1 % nasal spray Place 2 sprays into both nostrils 2 (two) times daily as needed for rhinitis. Use in each nostril as directed  ? EPINEPHrine (EPIPEN 2-PAK) 0.3 mg/0.3 mL IJ SOAJ injection Inject 0.3 mLs (0.3 mg total) into the muscle once for 1 dose.  ? esomeprazole (NEXIUM) 40 MG capsule TAKE 1 CAPSULE BY MOUTH EVERY DAY  ? fluticasone (FLONASE) 50 MCG/ACT nasal spray Place 2 sprays into both nostrils daily.  ? lisinopril (ZESTRIL) 5 MG tablet Take 1 tablet (5 mg total) by mouth daily.  ? montelukast (SINGULAIR) 10 MG tablet Take 10 mg by mouth at bedtime.  ? Multiple Vitamins-Minerals (PRESERVISION AREDS 2 PO) Take by mouth.  ? tiZANidine (ZANAFLEX) 4 MG tablet TAKE ONE TABLET (4 MG)  BY MOUTH EVERY 8 HOURS AS NEEDED FOR NUSCLE SPASMS.  ? traMADol (ULTRAM) 50 MG tablet Take 50 mg by mouth every 8 (eight) hours as needed.  ? ?No facility-administered encounter medications on file as of 08/02/2021.  ? ? ?Patient Active Problem List  ? Diagnosis Date Noted  ? Polycythemia 01/27/2021  ? Statin intolerance 01/27/2021  ? Stage 3a chronic kidney  disease ([place]) 01/27/2021  ? Overweight with body mass index (BMI) of 26 to 26.9 in adult 01/27/2021  ? Hyperlipidemia 11/21/2019  ? GERD (gastroesophageal reflux disease) 11/21/2019  ? Chronic lumbar radicular pain (Left) 04/18/2016  ? Neurogenic pain 02/11/2016  ? Chronic pain syndrome 02/10/2016  ? Long term prescription opiate use 02/10/2016  ? Opiate use (80-140 MME/Day) 02/10/2016  ? Chronic shoulder pain (Location of Primary Source of Pain) (Bilateral) (L>R) 02/10/2016  ? Chronic hip pain (Location of Secondary source of pain) (Bilateral) (L>R) 02/10/2016  ? Chronic sacroiliac joint pain (Location of Tertiary source of pain) (Bilateral) (L>R) 02/10/2016  ? Chronic low back pain (Bilateral) (L>R) 02/10/2016  ? 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 Chronic lower extremity pain (Bilateral) (L>R) 02/10/2016  ? Chronic knee pain (Bilateral) (L>R) 02/10/2016  ? Chronic elbow pain (Bilateral) (L>R) 02/10/2016  ? Chronic hand pain (Bilateral) (L>R) 02/10/2016  ? Hypertension 02/06/2012  ? Chronic fatigue syndrome 02/06/2012  ? Osteoporosis 02/06/2012  ? Chronic neck pain 01/31/2011  ? Depression 01/13/2011  ? Fibromyalgia 01/12/2011  ? ? ?Conditions to be addressed/monitored: HTN, CKD Stage 3a, Depression, Osteoporosis, and Chronic Pain Disorder ; Mental Health Concerns  ? ?Care Plan : General Social Work (Adult)  ?Updates made by Rebekah Chesterfield, LCSW since 08/02/2021 12:00 AM  ?  ? ?Problem: Quality of Life (General Plan of Care)   ?  ? ?Long-Range Goal: Quality of Life Maintained   ?Start Date: 07/14/2020  ?This Visit's Progress: On track  ?Recent Progress: On track  ?Priority: Medium  ?Note:   ?Current Barriers:  ?Chronic Mental Health needs related to pain and major depressive disorder ?Financial constraints related to managing health care expenses ?Limited social support ?ADL IADL limitations ?Suicidal Ideation/Homicidal Ideation: No ?Clinical Social Work Goal(s):  ?Over the next 120 days, patient will work  with SW bi-monthly by telephone or in person to reduce or manage symptoms related to chronic pain and depression ?Over the next 120 days, patient will work with SW to address concerns related to ongoing stress, isolation, pain and depressive symptoms ?Interventions: ?Patient interviewed and appropriate assessments performed: brief mental health assessment ?Patient reports ongoing balance concerns noting she injured self last Thursday. Patient's pain and discomfort has improved with resting for approx three days. Pt has a prescription for a new walker that she plans to provide [place] ?Patient endorsed improved mood and decrease in depression/anxiety symptoms when the weather is warm. During those times she engages with neighbors and increases activity. Recently enjoyed a visit from sister and brother in law ?Patient was successful in identifying that stress negatively impacts pain management and anxiety management ?CCM LCSW provided support and validation to patient's feelings. She is aware of healthy coping skills ?Patient continues to drive and will utilize friends, who are a strong support system for her, to assist if needed. Family resides in [place] but they talk 2-3 times a week via phone ?Patient enjoys reading and spending time with her dog, who keeps her active and is emotionally supportive. She is compliant with medications prescribed by provider ?Reviewed upcoming appointments ?  LCSW used empathetic and active and reflective listening, validated patient's feelings/concerns, and provided emotional support ?Discussed plans with patient for ongoing care management follow up and provided patient with direct contact information for care management team ?Patient Self Care Activities:  ?Attend all scheduled appointments with providers ?Continue to comply with medication management ?Continue utilizing strategies discussed to assist with management of symptoms ? ?  ?  ? ?Follow Up Plan: Appointment  scheduled for SW follow up with client by phone on: 10/04/21 ? ?Christa See, MSW, LCSW ?[facility] Care Management ?[place] [facility] ?[email or web address]

## 2021-08-09 DIAGNOSIS — M461 Sacroiliitis, not elsewhere classified: Secondary | ICD-10-CM | POA: Diagnosis not present

## 2021-08-09 DIAGNOSIS — G894 Chronic pain syndrome: Secondary | ICD-10-CM | POA: Diagnosis not present

## 2021-08-09 DIAGNOSIS — F1120 Opioid dependence, uncomplicated: Secondary | ICD-10-CM | POA: Diagnosis not present

## 2021-08-09 DIAGNOSIS — M797 Fibromyalgia: Secondary | ICD-10-CM | POA: Diagnosis not present

## 2021-08-11 ENCOUNTER — Telehealth: Payer: PPO

## 2021-09-15 ENCOUNTER — Telehealth: Payer: PPO

## 2021-09-22 ENCOUNTER — Telehealth: Payer: Self-pay

## 2021-09-22 ENCOUNTER — Telehealth: Payer: PPO

## 2021-09-22 NOTE — Telephone Encounter (Signed)
  Care Management   Follow Up Note   09/22/2021 Name: April Price MRN: [id number] DOB: Oct 24, 1955   Referred by: Jearld Fenton, NP Reason for referral : Chronic Care Management (RNCM: Follow up for Chronic Disease Management and Care Coordination Needs )   An unsuccessful telephone outreach was attempted today. The patient was referred to the case management team for assistance with care management and care coordination.   Follow Up Plan: A HIPPA compliant phone message was left for the patient providing contact information and requesting a return call.   Noreene Larsson RN, MSN, [place] Mobile: [phone]

## 2021-09-28 ENCOUNTER — Telehealth: Payer: Self-pay

## 2021-09-28 NOTE — Chronic Care Management (AMB) (Signed)
  Chronic Care Management Note  09/28/2021 Name: Betsy Rosello MRN: [id number] DOB: 1955-06-04  Wynelle Bourgeois Scruton is a 66 y.o. year old female who is a primary care patient of Garnette Gunner, Coralie Keens, NP and is actively engaged with the care management team. I reached out to [name] by phone today to assist with re-scheduling a follow up visit with the RN Case Manager  Follow up plan: Patient declines further follow up  with RNCM . Appropriate care team members and provider have been notified via electronic communication.   Noreene Larsson, [address], [place], [place] [address] Direct Dial: [phone] [email or web address]: [email or web address] \

## 2021-10-04 ENCOUNTER — Ambulatory Visit (INDEPENDENT_AMBULATORY_CARE_PROVIDER_SITE_OTHER): Payer: PPO | Admitting: Licensed Clinical Social Worker

## 2021-10-04 DIAGNOSIS — M797 Fibromyalgia: Secondary | ICD-10-CM

## 2021-10-04 DIAGNOSIS — I10 Essential (primary) hypertension: Secondary | ICD-10-CM

## 2021-10-04 DIAGNOSIS — F321 Major depressive disorder, single episode, moderate: Secondary | ICD-10-CM

## 2021-10-04 DIAGNOSIS — M810 Age-related osteoporosis without current pathological fracture: Secondary | ICD-10-CM

## 2021-10-04 NOTE — Chronic Care Management (AMB) (Signed)
Chronic Care Management    Clinical Social Work Note  10/04/2021 Name: April Price MRN: [id number] DOB: Aug 11, 1955  April Price is a 66 y.o. year old female who is a primary care patient of Lorre Munroe, NP. The CCM team was consulted to assist the patient with chronic disease management and/or care coordination needs related to: Mental Health Counseling and Resources.   Engaged with patient by telephone for follow up visit in response to provider referral for social work chronic care management and care coordination services.   Consent to Services:  The patient was given information about Chronic Care Management services, agreed to services, and gave verbal consent prior to initiation of services.  Please see initial visit note for detailed documentation.   Patient agreed to services and consent obtained.   Assessment: Review of patient past medical history, allergies, medications, and health status, including review of relevant consultants reports was performed today as part of a comprehensive evaluation and provision of chronic care management and care coordination services.     SDOH (Social Determinants of Health) assessments and interventions performed:    Advanced Directives Status: Not addressed in this encounter.  CCM Care Plan  Allergies  Allergen Reactions   Penicillins Anaphylaxis    Has patient had a PCN reaction causing immediate Price, facial/tongue/throat swelling, SOB or lightheadedness with hypotension: Yes Has patient had a PCN reaction causing severe Price involving mucus membranes or skin necrosis: No Has patient had a PCN reaction that required hospitalization No Has patient had a PCN reaction occurring within the last 10 years: Yes If all of the above answers are "NO", then may proceed with Cephalosporin use.    Tylenol [Acetaminophen] Anaphylaxis, Itching and Hives    Ears and throat itch    Armoracia Rusticana Ext (Horseradish)     Morphine And Related Other (See Comments)    Depression    Nickel Itching   Pepcid [Famotidine] Nausea And Vomiting   Shrimp [Shellfish Allergy] Hives   Tape     irritation   Tetanus Toxoids Other (See Comments)    Swelling at injection site and fever   Wheat Bran    Corn-Containing Products     Has hay fever and swelling in fingers.   Gabapentin Other (See Comments)    Makes her feel "spacy" off balance, like she is coming out of anesthesia.     Outpatient Encounter Medications as of 10/04/2021  Medication Sig   albuterol (PROVENTIL HFA;VENTOLIN HFA) 108 (90 Base) MCG/ACT inhaler Inhale 2 puffs into the lungs every 4 (four) hours as needed for wheezing or shortness of breath.   azelastine (ASTELIN) 0.1 % nasal spray Place 2 sprays into both nostrils 2 (two) times daily as needed for rhinitis. Use in each nostril as directed   EPINEPHrine (EPIPEN 2-PAK) 0.3 mg/0.3 mL IJ SOAJ injection Inject 0.3 mLs (0.3 mg total) into the muscle once for 1 dose.   esomeprazole (NEXIUM) 40 MG capsule TAKE 1 CAPSULE BY MOUTH EVERY DAY   fluticasone (FLONASE) 50 MCG/ACT nasal spray Place 2 sprays into both nostrils daily.   lisinopril (ZESTRIL) 5 MG tablet Take 1 tablet (5 mg total) by mouth daily.   montelukast (SINGULAIR) 10 MG tablet Take 10 mg by mouth at bedtime.   Multiple Vitamins-Minerals (PRESERVISION AREDS 2 PO) Take by mouth.   tiZANidine (ZANAFLEX) 4 MG tablet TAKE ONE TABLET (4 MG) BY MOUTH EVERY 8 HOURS AS NEEDED FOR NUSCLE SPASMS.   traMADol (ULTRAM) 50 MG  tablet Take 50 mg by mouth every 8 (eight) hours as needed.   No facility-administered encounter medications on file as of 10/04/2021.    Patient Active Problem List   Diagnosis Date Noted   Polycythemia 01/27/2021   Statin intolerance 01/27/2021   Stage 3a chronic kidney disease (HCC) 01/27/2021   Overweight with body mass index (BMI) of 26 to 26.9 in adult 01/27/2021   Hyperlipidemia 11/21/2019   GERD (gastroesophageal reflux  disease) 11/21/2019   Chronic lumbar radicular pain (Left) 04/18/2016   Neurogenic pain 02/11/2016   Chronic pain syndrome 02/10/2016   Long term prescription opiate use 02/10/2016   Opiate use (80-140 MME/Day) 02/10/2016   Chronic shoulder pain (Location of Primary Source of Pain) (Bilateral) (L>R) 02/10/2016   Chronic hip pain (Location of Secondary source of pain) (Bilateral) (L>R) 02/10/2016   Chronic sacroiliac joint pain (Location of Tertiary source of pain) (Bilateral) (L>R) 02/10/2016   Chronic low back pain (Bilateral) (L>R) 02/10/2016   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   Chronic lower extremity pain (Bilateral) (L>R) 02/10/2016   Chronic knee pain (Bilateral) (L>R) 02/10/2016   Chronic elbow pain (Bilateral) (L>R) 02/10/2016   Chronic hand pain (Bilateral) (L>R) 02/10/2016   Hypertension 02/06/2012   Chronic fatigue syndrome 02/06/2012   Osteoporosis 02/06/2012   Chronic neck pain 01/31/2011   Depression 01/13/2011   Fibromyalgia 01/12/2011    Conditions to be addressed/monitored: HTN, Depression, Osteoporosis, and Chronic Pain Syndrome  Care Plan : General Social Work (Adult)  Updates made by Jenel Lucks D, LCSW since 10/04/2021 12:00 AM     Problem: Quality of Life (General Plan of Care)      Long-Range Goal: Quality of Life Maintained Completed 10/04/2021  Start Date: 07/14/2020  This Visit's Progress: On track  Recent Progress: On track  Priority: Medium  Note:   Current Barriers:  Chronic Mental Health needs related to pain and major depressive disorder Financial constraints related to managing health care expenses Limited social support ADL IADL limitations Suicidal Ideation/Homicidal Ideation: No Clinical Social Work Goal(s):  Over the next 120 days, patient will work with SW bi-monthly by telephone or in person to reduce or manage symptoms related to chronic pain and depression Over the next 120 days, patient will work with SW to address concerns  related to ongoing stress, isolation, pain and depressive symptoms Interventions: Patient interviewed and appropriate assessments performed: brief mental health assessment Patient reports managing mental health symptoms and chronic pain Patient injured back last week assisting her neighbor. She continues to take medications and has agreed to inform provider at upcoming appt with [facility], if symptoms do not resolve CCM LCSW provided support and validation to patient's feelings. She is aware of healthy coping skills Patient continues to drive and will utilize friends, who are a strong support system for her, to assist if needed. Family resides in [place] but they talk 2-3 times a week via phone Patient enjoys reading and spending time with her dog, who keeps her active and is emotionally supportive. She is compliant with medications prescribed by provider Reviewed upcoming appointments LCSW used empathetic and active and reflective listening, validated patient's feelings/concerns, and provided emotional support Discussed plans with patient for ongoing care management follow up and provided patient with direct contact information for care management team Patient Self Care Activities:  Attend all scheduled appointments with providers Continue to comply with medication management Continue utilizing strategies discussed to assist with management of symptoms  Follow Up Plan: No follow up required      Jenel Lucks, MSW, LCSW [facility] Management [facility]  [facility] [email or web address] Phone [phone] 11:20 AM

## 2021-10-07 DIAGNOSIS — I10 Essential (primary) hypertension: Secondary | ICD-10-CM

## 2021-10-07 DIAGNOSIS — F321 Major depressive disorder, single episode, moderate: Secondary | ICD-10-CM

## 2021-10-07 DIAGNOSIS — M810 Age-related osteoporosis without current pathological fracture: Secondary | ICD-10-CM

## 2021-10-13 DIAGNOSIS — M461 Sacroiliitis, not elsewhere classified: Secondary | ICD-10-CM | POA: Diagnosis not present

## 2021-10-20 ENCOUNTER — Other Ambulatory Visit: Payer: Self-pay | Admitting: Internal Medicine

## 2021-10-20 DIAGNOSIS — Z0001 Encounter for general adult medical examination with abnormal findings: Secondary | ICD-10-CM

## 2021-10-20 NOTE — Telephone Encounter (Signed)
Requested medication (s) are due for refill today:   Provider to review  Requested medication (s) are on the active medication list:   Yes  Future visit scheduled:   Yes   Last ordered: 07/28/2021 #270, 0 refills  Non delegated refill reason returned    Requested Prescriptions  Pending Prescriptions Disp Refills   tiZANidine (ZANAFLEX) 4 MG tablet [Pharmacy Med Name: TIZANIDINE HCL 4 MG TAB] 270 tablet 0    Sig: TAKE ONE TABLET BY MOUTH EVERY 8 HOURS AS NEEDED FOR MUSCLE SPASMS     Not Delegated - Cardiovascular:  Alpha-2 Agonists - tizanidine Failed - 10/20/2021 11:01 AM      Failed - This refill cannot be delegated      Passed - Valid encounter within last 6 months    Recent Outpatient Visits           2 months ago Encounter for general adult medical examination with abnormal findings   [facility] [place], Coralie Keens, NP   8 months ago Primary hypertension   [facility] [place], Coralie Keens, NP   8 months ago Stage 3a chronic kidney disease [facility])   [facility] [place], Coralie Keens, NP   1 year ago Mild episode of recurrent major depressive disorder [facility])   [facility] Olin Hauser, DO   1 year ago Other depression   [facility], Lupita Raider, FNP       Future Appointments             In 3 months Baity, Coralie Keens, NP [facility], [facility]

## 2021-11-11 DIAGNOSIS — Z6828 Body mass index (BMI) 28.0-28.9, adult: Secondary | ICD-10-CM | POA: Diagnosis not present

## 2021-11-11 DIAGNOSIS — M461 Sacroiliitis, not elsewhere classified: Secondary | ICD-10-CM | POA: Diagnosis not present

## 2021-11-11 DIAGNOSIS — M797 Fibromyalgia: Secondary | ICD-10-CM | POA: Diagnosis not present

## 2021-12-15 ENCOUNTER — Other Ambulatory Visit: Payer: Self-pay | Admitting: Internal Medicine

## 2021-12-16 NOTE — Telephone Encounter (Signed)
last RF 07/28/21 #90 1 RF  Requested Prescriptions  Refused Prescriptions Disp Refills  . lisinopril (ZESTRIL) 5 MG tablet [Pharmacy Med Name: LISINOPRIL 5 MG TAB] 90 tablet 1    Sig: TAKE ONE TABLET BY MOUTH EVERY DAY     Cardiovascular:  ACE Inhibitors Failed - 12/15/2021 11:38 AM      Failed - Cr in normal range and within 180 days    Creat  Date Value Ref Range Status  07/28/2021 1.33 (H) 0.50 - 1.05 mg/dL Final         Passed - K in normal range and within 180 days    Potassium  Date Value Ref Range Status  07/28/2021 5.1 3.5 - 5.3 mmol/L Final         Passed - Patient is not pregnant      Passed - Last BP in normal range    BP Readings from Last 1 Encounters:  07/28/21 124/76         Passed - Valid encounter within last 6 months    Recent Outpatient Visits          4 months ago Encounter for general adult medical examination with abnormal findings   [facility] [place], Coralie Keens, NP   10 months ago Primary hypertension   [facility] [place], Coralie Keens, NP   10 months ago Stage 3a chronic kidney disease [facility])   [facility] [place], Coralie Keens, NP   1 year ago Mild episode of recurrent major depressive disorder [facility])   [facility] Olin Hauser, DO   1 year ago Other depression   [place], FNP      Future Appointments            In 1 month Baity, Coralie Keens, NP [facility], [facility]

## 2021-12-20 ENCOUNTER — Encounter: Payer: Self-pay | Admitting: Physician Assistant

## 2021-12-20 ENCOUNTER — Ambulatory Visit (INDEPENDENT_AMBULATORY_CARE_PROVIDER_SITE_OTHER): Payer: PPO | Admitting: Physician Assistant

## 2021-12-20 VITALS — BP 126/60 | HR 91 | Ht 63.0 in | Wt 149.0 lb

## 2021-12-20 DIAGNOSIS — M7021 Olecranon bursitis, right elbow: Secondary | ICD-10-CM | POA: Diagnosis not present

## 2021-12-20 MED ORDER — PREDNISONE 20 MG PO TABS: ORAL_TABLET | ORAL | 0 refills | Status: DC

## 2021-12-20 MED ORDER — CEPHALEXIN 500 MG PO CAPS: 500.0000 mg | ORAL_CAPSULE | Freq: Four times a day (QID) | ORAL | 0 refills | Status: AC

## 2021-12-20 NOTE — Progress Notes (Signed)
Acute Office Visit   Patient: April Price   DOB: Apr 20, 1955   66 y.o. Female  MRN: [id number] Visit Date: 12/20/2021  Today's healthcare provider: Dani Gobble Nicodemus Denk, PA-C  Introduced myself to the patient as a Journalist, newspaper and provided education on APPs in clinical practice.    Chief Complaint  Patient presents with   Bursitis   Elbow Pain   Subjective    HPI   Reports she has bursitis of her right elbow States she has been trying to ice it and use an ace bandage but she has not had much relief She reports she woke up Sun morning and her right elbow was swollen Denies drainage from elbow, fevers,  Reports elbow is warm to the touch She tried to take leftover Prednisone but only had 2 pills left and reports minimal relief Reports right elbow hurts to move but ROM appears intact     Medications: Outpatient Medications Prior to Visit  Medication Sig   albuterol (PROVENTIL HFA;VENTOLIN HFA) 108 (90 Base) MCG/ACT inhaler Inhale 2 puffs into the lungs every 4 (four) hours as needed for wheezing or shortness of breath.   azelastine (ASTELIN) 0.1 % nasal spray Place 2 sprays into both nostrils 2 (two) times daily as needed for rhinitis. Use in each nostril as directed   esomeprazole (NEXIUM) 40 MG capsule TAKE 1 CAPSULE BY MOUTH EVERY DAY   fluticasone (FLONASE) 50 MCG/ACT nasal spray Place 2 sprays into both nostrils daily.   lisinopril (ZESTRIL) 5 MG tablet Take 1 tablet (5 mg total) by mouth daily.   montelukast (SINGULAIR) 10 MG tablet Take 10 mg by mouth at bedtime.   Multiple Vitamins-Minerals (PRESERVISION AREDS 2 PO) Take by mouth.   tiZANidine (ZANAFLEX) 4 MG tablet TAKE ONE TABLET BY MOUTH EVERY 8 HOURS AS NEEDED FOR MUSCLE SPASMS   traMADol (ULTRAM) 50 MG tablet Take 50 mg by mouth every 8 (eight) hours as needed.   EPINEPHrine (EPIPEN 2-PAK) 0.3 mg/0.3 mL IJ SOAJ injection Inject 0.3 mLs (0.3 mg total) into the muscle once for 1 dose.   No facility-administered  medications prior to visit.    Review of Systems  Constitutional:  Negative for chills, diaphoresis and fever.  Musculoskeletal:  Positive for arthralgias and joint swelling (right elbow).  Neurological:  Negative for dizziness, tremors, facial asymmetry, weakness, light-headedness and numbness.       Objective    BP 126/60   Pulse 91   Ht '5\' 3"'$  (1.6 m)   Wt 149 lb (67.6 kg)   SpO2 96%   BMI 26.39 kg/m    Physical Exam Vitals reviewed.  Constitutional:      General: She is awake.     Appearance: Normal appearance. She is well-developed and well-groomed.  HENT:     Head: Normocephalic and atraumatic.  Pulmonary:     Effort: Pulmonary effort is normal.  Musculoskeletal:     Right elbow: Swelling present. Normal range of motion. Tenderness present in olecranon process.     Left elbow: No swelling.     Right forearm: No swelling or edema.     Left forearm: No swelling or edema.     Right hand: No swelling. Normal strength. Normal pulse.     Left hand: No swelling. Normal strength. Normal pulse.     Cervical back: Normal range of motion and neck supple.     Comments: Right elbow is swollen over olecranon process Central abrasion  noted and area feels mildly fluctuant to palpation  Erythema is observed extending over entire posterior elbow and warmth is noted as well.   Neurological:     General: No focal deficit present.     Mental Status: She is alert and oriented to person, place, and time.     GCS: GCS eye subscore is 4. GCS verbal subscore is 5. GCS motor subscore is 6.     Cranial Nerves: No dysarthria or facial asymmetry.     Motor: No weakness or tremor.  Psychiatric:        Behavior: Behavior is cooperative.       No results found for any visits on 12/20/21.  Assessment & Plan      No follow-ups on file.       Problem List Items Addressed This Visit   None Visit Diagnoses     Olecranon bursitis of right elbow    -  Primary Acute, new  concern Reports swelling ,warmth and pain of right elbow since Sunday that is not improving with home care Given extent of swelling, redness, warmth and fluctuance, I am concerned for infection at this time Will rx course of keflex 500 mg PO QID x 10 days and Prednisone taper for 7 days to assist with resolution Recommend warm compresses over the area and avoiding manipulation She can use Aquaphor or Vaseline as desired to help with dryness - no OTC abx ointment  Reviewed return precautions with her and she voiced understanding Follow up for progressing or persistent symptoms     Relevant Medications   cephALEXin (KEFLEX) 500 MG capsule   predniSONE (DELTASONE) 20 MG tablet        No follow-ups on file.   I, Karem Tomaso E Tyechia Allmendinger, PA-C, have reviewed all documentation for this visit. The documentation on 12/20/21 for the exam, diagnosis, procedures, and orders are all accurate and complete.   Talitha Givens, MHS, PA-C [facility]

## 2021-12-21 ENCOUNTER — Other Ambulatory Visit: Payer: Self-pay | Admitting: Physician Assistant

## 2021-12-21 DIAGNOSIS — M7021 Olecranon bursitis, right elbow: Secondary | ICD-10-CM

## 2021-12-21 MED ORDER — SULFAMETHOXAZOLE-TRIMETHOPRIM 800-160 MG PO TABS: 1.0000 | ORAL_TABLET | Freq: Two times a day (BID) | ORAL | 0 refills | Status: AC

## 2021-12-21 NOTE — Progress Notes (Signed)
Patient called into office stating Keflex caused a reaction. Substituted with Bactrim BID for 10 days to manage. Follow up as needed

## 2022-01-18 ENCOUNTER — Other Ambulatory Visit: Payer: Self-pay | Admitting: Internal Medicine

## 2022-01-18 DIAGNOSIS — Z0001 Encounter for general adult medical examination with abnormal findings: Secondary | ICD-10-CM

## 2022-01-18 NOTE — Telephone Encounter (Signed)
Requested medication (s) are due for refill today: yes  Requested medication (s) are on the active medication list:yes  Last refill:  10/21/21  Future visit scheduled: yes  Notes to clinic:  Unable to refill per protocol, cannot delegate.      Requested Prescriptions  Pending Prescriptions Disp Refills   tiZANidine (ZANAFLEX) 4 MG tablet [Pharmacy Med Name: TIZANIDINE HCL 4 MG TAB] 270 tablet 0    Sig: TAKE ONE TABLET BY MOUTH EVERY 8 HOURS AS NEEDED FOR MUSCLE SPASMS     Not Delegated - Cardiovascular:  Alpha-2 Agonists - tizanidine Failed - 01/18/2022 10:10 AM      Failed - This refill cannot be delegated      Passed - Valid encounter within last 6 months    Recent Outpatient Visits           4 weeks ago Olecranon bursitis of right elbow   [place], PA-C   5 months ago Encounter for general adult medical examination with abnormal findings   [facility] [place], Coralie Keens, NP   11 months ago Primary hypertension   [facility] [place], Coralie Keens, NP   11 months ago Stage 3a chronic kidney disease [facility])   [facility] [place], Coralie Keens, NP   1 year ago Mild episode of recurrent major depressive disorder [facility])   [facility] Olin Hauser, DO       Future Appointments             In 1 week Baity, Coralie Keens, NP [facility], [facility]

## 2022-01-30 ENCOUNTER — Ambulatory Visit: Payer: PPO | Admitting: Internal Medicine

## 2022-01-30 ENCOUNTER — Ambulatory Visit (INDEPENDENT_AMBULATORY_CARE_PROVIDER_SITE_OTHER): Payer: PPO | Admitting: Physician Assistant

## 2022-01-30 ENCOUNTER — Encounter: Payer: Self-pay | Admitting: Physician Assistant

## 2022-01-30 VITALS — BP 138/76 | HR 85 | Temp 96.8°F | Wt 155.0 lb

## 2022-01-30 DIAGNOSIS — Z23 Encounter for immunization: Secondary | ICD-10-CM

## 2022-01-30 DIAGNOSIS — I10 Essential (primary) hypertension: Secondary | ICD-10-CM

## 2022-01-30 DIAGNOSIS — Z8639 Personal history of other endocrine, nutritional and metabolic disease: Secondary | ICD-10-CM | POA: Diagnosis not present

## 2022-01-30 DIAGNOSIS — E785 Hyperlipidemia, unspecified: Secondary | ICD-10-CM

## 2022-01-30 DIAGNOSIS — Z1231 Encounter for screening mammogram for malignant neoplasm of breast: Secondary | ICD-10-CM | POA: Diagnosis not present

## 2022-01-30 DIAGNOSIS — Z13820 Encounter for screening for osteoporosis: Secondary | ICD-10-CM | POA: Diagnosis not present

## 2022-01-30 DIAGNOSIS — Z131 Encounter for screening for diabetes mellitus: Secondary | ICD-10-CM | POA: Diagnosis not present

## 2022-01-30 DIAGNOSIS — K219 Gastro-esophageal reflux disease without esophagitis: Secondary | ICD-10-CM

## 2022-01-30 MED ORDER — ESOMEPRAZOLE MAGNESIUM 40 MG PO CPDR: DELAYED_RELEASE_CAPSULE | ORAL | 1 refills | Status: DC

## 2022-01-30 MED ORDER — LISINOPRIL 5 MG PO TABS: 5.0000 mg | ORAL_TABLET | Freq: Every day | ORAL | 1 refills | Status: DC

## 2022-01-30 NOTE — Assessment & Plan Note (Signed)
Chronic, ongoing  Appears to be well controlled per home readings from patient report She is taking Lisinopril 5 mg PO QD and appears to be tolerating well- continue current medication regimen Encourage heart healthy diet and regular exercise to provide further management Follow up in 6 months for monitoring

## 2022-01-30 NOTE — Progress Notes (Signed)
Established Patient Office Visit  Name: April Price   MRN: [id number]    DOB: 1956-01-03   Date:01/30/2022  Today's Provider: Talitha Givens, MHS, PA-C Introduced myself to the patient as a PA-C and provided education on APPs in clinical practice.         Subjective  Chief Complaint  Chief Complaint  Patient presents with   Hyperlipidemia   Hypertension   Gastroesophageal Reflux    HPI  HYPERTENSION / HYPERLIPIDEMIA Satisfied with current treatment? yes Duration of hypertension: years BP monitoring frequency: a few times a week BP range: 120s-130s/70s BP medication side effects: no Past BP meds: lisinopril Duration of hyperlipidemia: years Cholesterol medication side effects: yes- she states they make her hurt worse so she is not on medications Cholesterol supplements: none Past cholesterol medications: none Medication compliance: excellent compliance Aspirin: no Recent stressors: no Recurrent headaches: no Visual changes: no Palpitations: no Dyspnea: no Chest pain: no Lower extremity edema: no Dizzy/lightheaded: no   GERD GERD control status: stable Satisfied with current treatment? yes Heartburn frequency: With Nexium she doesn't have many symptoms- fried foods and meds on an empty stomach seem to aggravate Medication side effects: no  Medication compliance: stable Previous GERD medications: Antacid use frequency:  Not having to use TUMS or pepcid for breakthough Duration:  Nature:  Location:  Heartburn duration:  Alleviatiating factors:   Aggravating factors:  Dysphagia: no Odynophagia:  no Hematemesis: no Blood in stool: no EGD: no     Patient Active Problem List   Diagnosis Date Noted   Polycythemia 01/27/2021   Statin intolerance 01/27/2021   Stage 3a chronic kidney disease ([place]) 01/27/2021   Overweight with body mass index (BMI) of 26 to 26.9 in adult 01/27/2021   Hyperlipidemia 11/21/2019   GERD (gastroesophageal  reflux disease) 11/21/2019   Chronic lumbar radicular pain (Left) 04/18/2016   Neurogenic pain 02/11/2016   Chronic pain syndrome 02/10/2016   Long term prescription opiate use 02/10/2016   Opiate use (80-140 MME/Day) 02/10/2016   Chronic shoulder pain (Location of Primary Source of Pain) (Bilateral) (L>R) 02/10/2016   Chronic hip pain (Location of Secondary source of pain) (Bilateral) (L>R) 02/10/2016   Chronic sacroiliac joint pain (Location of Tertiary source of pain) (Bilateral) (L>R) 02/10/2016   Chronic low back pain (Bilateral) (L>R) 02/10/2016   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   Chronic lower extremity pain (Bilateral) (L>R) 02/10/2016   Chronic knee pain (Bilateral) (L>R) 02/10/2016   Chronic elbow pain (Bilateral) (L>R) 02/10/2016   Chronic hand pain (Bilateral) (L>R) 02/10/2016   Hypertension 02/06/2012   Chronic fatigue syndrome 02/06/2012   Osteoporosis 02/06/2012   Chronic neck pain 01/31/2011   Depression 01/13/2011   Fibromyalgia 01/12/2011    Past Surgical History:  Procedure Laterality Date   ABDOMINAL HYSTERECTOMY     BACK SURGERY     Spinal Fusion   BREAST BIOPSY Right    cataract     CHOLECYSTECTOMY     COLONOSCOPY  2006   EYE SURGERY Bilateral 2011   cataract   FRACTURE SURGERY  2018   left wrist   NASAL SINUS SURGERY     NASAL SINUS SURGERY     OPEN REDUCTION INTERNAL FIXATION (ORIF) DISTAL RADIAL FRACTURE Left 06/05/2016   Procedure: OPEN REDUCTION INTERNAL FIXATION (ORIF) DISTAL RADIAL FRACTURE;  Surgeon: Earnestine Leys, MD;  Location: [facility] ORS;  Service: Orthopedics;  Laterality: Left;  Hand Innovations needed   [place]  2003,  2012   TYMPANOSTOMY TUBE PLACEMENT      Family History  Problem Relation Age of Onset   Heart disease Mother    Arthritis Mother    Vision loss Mother    Heart disease Father    Stroke Father    Vascular Disease Sister    Heart disease Brother    Colon cancer Neg Hx    Breast cancer Neg Hx      Social History   Tobacco Use   Smoking status: Every Day    Packs/day: 0.50    Years: 40.00    Total pack years: 20.00    Types: Cigarettes   Smokeless tobacco: Never  Substance Use Topics   Alcohol use: Never     Current Outpatient Medications:    albuterol (PROVENTIL HFA;VENTOLIN HFA) 108 (90 Base) MCG/ACT inhaler, Inhale 2 puffs into the lungs every 4 (four) hours as needed for wheezing or shortness of breath., Disp: 3 Inhaler, Rfl: 1   azelastine (ASTELIN) 0.1 % nasal spray, Place 2 sprays into both nostrils 2 (two) times daily as needed for rhinitis. Use in each nostril as directed, Disp: 30 mL, Rfl: 5   fluticasone (FLONASE) 50 MCG/ACT nasal spray, Place 2 sprays into both nostrils daily., Disp: 16 g, Rfl: 6   montelukast (SINGULAIR) 10 MG tablet, Take 10 mg by mouth at bedtime., Disp: , Rfl:    Multiple Vitamins-Minerals (PRESERVISION AREDS 2 PO), Take by mouth., Disp: , Rfl:    tiZANidine (ZANAFLEX) 4 MG tablet, TAKE ONE TABLET BY MOUTH EVERY 8 HOURS AS NEEDED FOR MUSCLE SPASMS, Disp: 270 tablet, Rfl: 0   traMADol (ULTRAM) 50 MG tablet, Take 50 mg by mouth every 8 (eight) hours as needed., Disp: , Rfl:    EPINEPHrine (EPIPEN 2-PAK) 0.3 mg/0.3 mL IJ SOAJ injection, Inject 0.3 mLs (0.3 mg total) into the muscle once for 1 dose., Disp: 0.3 mL, Rfl: 0   esomeprazole (NEXIUM) 40 MG capsule, TAKE 1 CAPSULE BY MOUTH EVERY DAY, Disp: 90 capsule, Rfl: 1   lisinopril (ZESTRIL) 5 MG tablet, Take 1 tablet (5 mg total) by mouth daily., Disp: 90 tablet, Rfl: 1  Allergies  Allergen Reactions   Penicillins Anaphylaxis    Has patient had a PCN reaction causing immediate rash, facial/tongue/throat swelling, SOB or lightheadedness with hypotension: Yes Has patient had a PCN reaction causing severe rash involving mucus membranes or skin necrosis: No Has patient had a PCN reaction that required hospitalization No Has patient had a PCN reaction occurring within the last 10 years: Yes If all  of the above answers are "NO", then may proceed with Cephalosporin use.    Tylenol [Acetaminophen] Anaphylaxis, Itching and Hives    Ears and throat itch    Armoracia Rusticana Ext (Horseradish)    Morphine And Related Other (See Comments)    Depression    Nickel Itching   Pepcid [Famotidine] Nausea And Vomiting   Shrimp [Shellfish Allergy] Hives   Tape     irritation   Tetanus Toxoids Other (See Comments)    Swelling at injection site and fever   Wheat Bran    Corn-Containing Products     Has hay fever and swelling in fingers.   Gabapentin Other (See Comments)    Makes her feel "spacy" off balance, like she is coming out of anesthesia.     I personally reviewed active problem list, medication list, allergies, health maintenance, notes from last encounter, lab results with the patient/caregiver today.   Review  of Systems  Constitutional:  Negative for chills, diaphoresis, fever and weight loss.  HENT:  Negative for hearing loss.   Respiratory:  Negative for shortness of breath and wheezing.   Cardiovascular:  Negative for chest pain, palpitations and leg swelling.  Gastrointestinal:  Negative for constipation, diarrhea, heartburn, nausea and vomiting.  Neurological:  Negative for dizziness, tingling and headaches.      Objective  Vitals:   01/30/22 0821 01/30/22 0827  BP: (!) 146/78 138/76  Pulse: 85   Temp: (!) 96.8 F (36 C)   TempSrc: Temporal   SpO2: 97%   Weight: 155 lb (70.3 kg)     Body mass index is 27.46 kg/m.  Physical Exam Vitals reviewed.  Constitutional:      General: She is awake.     Appearance: Normal appearance. She is well-developed and well-groomed.  HENT:     Head: Normocephalic and atraumatic.  Cardiovascular:     Rate and Rhythm: Normal rate and regular rhythm.     Pulses: Normal pulses.          Radial pulses are 2+ on the right side and 2+ on the left side.     Heart sounds: Normal heart sounds. No murmur heard.    No friction  rub. No gallop.  Pulmonary:     Effort: Pulmonary effort is normal.     Breath sounds: Normal breath sounds. No decreased air movement. No decreased breath sounds, wheezing, rhonchi or rales.  Lymphadenopathy:     Head:     Right side of head: No submental or submandibular adenopathy.     Left side of head: No submental or submandibular adenopathy.     Upper Body:     Right upper body: No supraclavicular adenopathy.     Left upper body: No supraclavicular adenopathy.  Neurological:     Mental Status: She is alert.  Psychiatric:        Attention and Perception: Attention and perception normal.        Mood and Affect: Mood and affect normal.        Speech: Speech normal.        Behavior: Behavior normal. Behavior is cooperative.      No results found for this or any previous visit (from the past 2160 hour(s)).   PHQ2/9:    12/20/2021   10:31 AM 07/28/2021    8:45 AM 06/13/2021    2:02 PM 02/10/2021    1:46 PM 06/01/2020    9:10 AM  Depression screen PHQ 2/9  Decreased Interest 0 0 0 0 0  Down, Depressed, Hopeless 0 0 0 0 1  PHQ - 2 Score 0 0 0 0 1  Altered sleeping '1 1 3 1 3  '$ Tired, decreased energy '1 2 3 '$ 0 3  Change in appetite 0 0 0 0 0  Feeling bad or failure about yourself  0 0 0 0 0  Trouble concentrating 0 0 0 0 0  Moving slowly or fidgety/restless 0 0 0 0 0  Suicidal thoughts 0 0 0 0 0  PHQ-9 Score '2 3 6 1 7  '$ Difficult doing work/chores Not difficult at all Somewhat difficult Not difficult at all Not difficult at all Somewhat difficult      Fall Risk:    12/20/2021   10:30 AM 07/28/2021    8:45 AM 06/13/2021    2:01 PM 06/13/2021    8:42 AM 06/01/2020    9:09 AM  [place]  in the past year? 0 '1 1 1 '$ 0  Number falls in past yr: 0 '1 1 1   '$ Injury with Fall? 0 0 0 0   Risk for fall due to : History of fall(s) History of fall(s) No Fall Risks  Medication side effect;Impaired balance/gait  Follow up Falls evaluation completed Falls evaluation completed Falls  evaluation completed  Falls evaluation completed;Education provided;Falls prevention discussed      Functional Status Survey:      Assessment & Plan  Problem List Items Addressed This Visit       Cardiovascular and Mediastinum   Hypertension    Chronic, ongoing  Appears to be well controlled per home readings from patient report She is taking Lisinopril 5 mg PO QD and appears to be tolerating well- continue current medication regimen Encourage heart healthy diet and regular exercise to provide further management Follow up in 6 months for monitoring       Relevant Medications   lisinopril (ZESTRIL) 5 MG tablet   Other Relevant Orders   COMPLETE METABOLIC PANEL WITH GFR   CBC w/Diff/Platelet     Digestive   GERD (gastroesophageal reflux disease)    Chronic, ongoing, well controlled Patient states her symptoms are well controlled with nexium 40 mg PO QD and avoiding trigger foods  Continue medications and lifestyle modifications Follow up as needed.       Relevant Medications   esomeprazole (NEXIUM) 40 MG capsule   Other Relevant Orders   CBC w/Diff/Platelet     Other   Hyperlipidemia    Chronic, uncontrolled Patient reports she avoids fried foods and grease due to GERD Reports she is unable to tolerate cholesterol medications due to body aches  Will recheck lipid panel today- results to dictate further recommendations  Recommend diet and exercise to provide management in lieu of medication therapy Follow up in 6 months for monitoring.       Relevant Medications   lisinopril (ZESTRIL) 5 MG tablet   Other Relevant Orders   Lipid Profile   Other Visit Diagnoses     Need for influenza vaccination    -  Primary   Relevant Orders   Flu Vaccine QUAD High Dose(Fluad) (Completed)   H/O vitamin D deficiency       Relevant Orders   Vitamin D (25 hydroxy)   Screening for diabetes mellitus (DM)       Relevant Orders   HgB A1c   Encounter for screening mammogram  for malignant neoplasm of breast       Relevant Orders   MM 3D SCREEN BREAST BILATERAL   Screening for osteoporosis       Relevant Orders   DG Bone Density        No follow-ups on file.   I, Ameer Sanden E Danyka Merlin, PA-C, have reviewed all documentation for this visit. The documentation on 01/30/22 for the exam, diagnosis, procedures, and orders are all accurate and complete.   Talitha Givens, MHS, PA-C [facility]

## 2022-01-30 NOTE — Assessment & Plan Note (Signed)
Chronic, ongoing, well controlled Patient states her symptoms are well controlled with nexium 40 mg PO QD and avoiding trigger foods  Continue medications and lifestyle modifications Follow up as needed.

## 2022-01-30 NOTE — Assessment & Plan Note (Signed)
Chronic, uncontrolled Patient reports she avoids fried foods and grease due to GERD Reports she is unable to tolerate cholesterol medications due to body aches  Will recheck lipid panel today- results to dictate further recommendations  Recommend diet and exercise to provide management in lieu of medication therapy Follow up in 6 months for monitoring.

## 2022-01-31 DIAGNOSIS — Z961 Presence of intraocular lens: Secondary | ICD-10-CM | POA: Diagnosis not present

## 2022-01-31 DIAGNOSIS — H353131 Nonexudative age-related macular degeneration, bilateral, early dry stage: Secondary | ICD-10-CM | POA: Diagnosis not present

## 2022-01-31 LAB — COMPLETE METABOLIC PANEL WITH GFR
AG Ratio: 1.2 (calc) (ref 1.0–2.5)
ALT: 6 U/L (ref 6–29)
AST: 10 U/L (ref 10–35)
Albumin: 4.1 g/dL (ref 3.6–5.1)
Alkaline phosphatase (APISO): 85 U/L (ref 37–153)
BUN/Creatinine Ratio: 18 (calc) (ref 6–22)
BUN: 28 mg/dL — ABNORMAL HIGH (ref 7–25)
CO2: 18 mmol/L — ABNORMAL LOW (ref 20–32)
Calcium: 9.9 mg/dL (ref 8.6–10.4)
Chloride: 108 mmol/L (ref 98–110)
Creat: 1.52 mg/dL — ABNORMAL HIGH (ref 0.50–1.05)
Globulin: 3.3 g/dL (calc) (ref 1.9–3.7)
Glucose, Bld: 89 mg/dL (ref 65–99)
Potassium: 5.4 mmol/L — ABNORMAL HIGH (ref 3.5–5.3)
Sodium: 138 mmol/L (ref 135–146)
Total Bilirubin: 0.4 mg/dL (ref 0.2–1.2)
Total Protein: 7.4 g/dL (ref 6.1–8.1)
eGFR: 38 mL/min/{1.73_m2} — ABNORMAL LOW (ref >=60)

## 2022-01-31 LAB — LIPID PANEL
Cholesterol: 256 mg/dL — ABNORMAL HIGH (ref <200)
HDL: 52 mg/dL (ref >=50)
LDL Cholesterol (Calc): 173 mg/dL (calc) — ABNORMAL HIGH
Non-HDL Cholesterol (Calc): 204 mg/dL (calc) — ABNORMAL HIGH (ref <130)
Total CHOL/HDL Ratio: 4.9 (calc) (ref <5.0)
Triglycerides: 162 mg/dL — ABNORMAL HIGH (ref <150)

## 2022-01-31 LAB — CBC WITH DIFFERENTIAL/PLATELET
Absolute Monocytes: 595 cells/uL (ref 200–950)
Basophils Absolute: 60 cells/uL (ref 0–200)
Basophils Relative: 0.7 %
Eosinophils Absolute: 94 cells/uL (ref 15–500)
Eosinophils Relative: 1.1 %
HCT: 47.7 % — ABNORMAL HIGH (ref 35.0–45.0)
Hemoglobin: 15.7 g/dL — ABNORMAL HIGH (ref 11.7–15.5)
Lymphs Abs: 2338 cells/uL (ref 850–3900)
MCH: 31 pg (ref 27.0–33.0)
MCHC: 32.9 g/dL (ref 32.0–36.0)
MCV: 94.1 fL (ref 80.0–100.0)
MPV: 10.5 fL (ref 7.5–12.5)
Monocytes Relative: 7 %
Neutro Abs: 5415 cells/uL (ref 1500–7800)
Neutrophils Relative %: 63.7 %
Platelets: 248 10*3/uL (ref 140–400)
RBC: 5.07 10*6/uL (ref 3.80–5.10)
RDW: 12.9 % (ref 11.0–15.0)
Total Lymphocyte: 27.5 %
WBC: 8.5 10*3/uL (ref 3.8–10.8)

## 2022-01-31 LAB — VITAMIN D 25 HYDROXY (VIT D DEFICIENCY, FRACTURES): Vit D, 25-Hydroxy: 10 ng/mL — ABNORMAL LOW (ref 30–100)

## 2022-01-31 LAB — HEMOGLOBIN A1C
Hgb A1c MFr Bld: 5.5 % of total Hgb (ref <5.7)
Mean Plasma Glucose: 111 mg/dL
eAG (mmol/L): 6.2 mmol/L

## 2022-02-06 ENCOUNTER — Telehealth: Payer: Self-pay

## 2022-02-06 DIAGNOSIS — E875 Hyperkalemia: Secondary | ICD-10-CM

## 2022-02-06 NOTE — Telephone Encounter (Unsigned)
Copied from [place] [id number]. Topic: General - Other >> Feb 06, 2022  2:56 PM April Price wrote: Reason for CRM: Pt called to check if her Potassium Blood work order was placed so she can come in tomorrow to have it done / please advise asap about order and pt coming in tomorrow for lab

## 2022-02-07 NOTE — Addendum Note (Signed)
Addended by: Jearld Fenton on: 02/07/2022 07:51 AM   Modules accepted: Orders

## 2022-02-07 NOTE — Telephone Encounter (Signed)
Potassium lab ordered.

## 2022-02-14 ENCOUNTER — Other Ambulatory Visit: Payer: Self-pay

## 2022-02-14 DIAGNOSIS — E875 Hyperkalemia: Secondary | ICD-10-CM

## 2022-02-14 DIAGNOSIS — M797 Fibromyalgia: Secondary | ICD-10-CM | POA: Diagnosis not present

## 2022-02-14 DIAGNOSIS — M461 Sacroiliitis, not elsewhere classified: Secondary | ICD-10-CM | POA: Diagnosis not present

## 2022-02-14 DIAGNOSIS — Z6827 Body mass index (BMI) 27.0-27.9, adult: Secondary | ICD-10-CM | POA: Diagnosis not present

## 2022-02-14 DIAGNOSIS — F1120 Opioid dependence, uncomplicated: Secondary | ICD-10-CM | POA: Diagnosis not present

## 2022-02-14 LAB — POTASSIUM: Potassium: 4.6 mmol/L (ref 3.5–5.3)

## 2022-03-13 DIAGNOSIS — M461 Sacroiliitis, not elsewhere classified: Secondary | ICD-10-CM | POA: Diagnosis not present

## 2022-03-28 ENCOUNTER — Telehealth: Payer: Self-pay

## 2022-03-28 NOTE — Telephone Encounter (Signed)
The pt called requesting a telephone visit as soon as possible. I scheduled the patient for tomorrow morning at 8:00 am. She stated that she had her RSV vaccination on 03/10/22 and three days later she developed balance issues that have been persistent since the injection. She reports that she cannot come into the office because she feels too unstable to leave her home. I stress the importance of needing to be evaluated in person. The patient declined to come in the office.

## 2022-03-29 ENCOUNTER — Encounter: Payer: Self-pay | Admitting: Internal Medicine

## 2022-03-29 ENCOUNTER — Ambulatory Visit (INDEPENDENT_AMBULATORY_CARE_PROVIDER_SITE_OTHER): Payer: PPO | Admitting: Internal Medicine

## 2022-03-29 DIAGNOSIS — G8929 Other chronic pain: Secondary | ICD-10-CM | POA: Diagnosis not present

## 2022-03-29 DIAGNOSIS — R202 Paresthesia of skin: Secondary | ICD-10-CM | POA: Diagnosis not present

## 2022-03-29 DIAGNOSIS — M79604 Pain in right leg: Secondary | ICD-10-CM

## 2022-03-29 DIAGNOSIS — M79605 Pain in left leg: Secondary | ICD-10-CM | POA: Diagnosis not present

## 2022-03-29 DIAGNOSIS — M5416 Radiculopathy, lumbar region: Secondary | ICD-10-CM

## 2022-03-29 NOTE — Patient Instructions (Signed)
How to Use a Kasandra Knudsen  Canes are used to help with walking. Using a cane makes you more stable, reduces pain, and eases strain on certain muscle groups. There are various kinds of canes. Most have either a single point, four points (quad cane), or three points at the bottom. The best kind of cane for you depends on what you need it for. People with arthritis generally do well with a single-point cane. People who have certain neurological conditions, such as people who have had a stroke, may do better with a quad cane because it allows them to put more weight on it (support more of their body weight). How to choose a cane that fits It is important to use a cane that fits properly. A cane fits properly if the top of the cane comes to your wrist joint when you are standing upright with your arm relaxed at your side. How to use your cane Hold your cane in the hand opposite the injured or weaker side. Always move the cane and the foot of the weaker side with each other (in unison). Walking Put as much weight on the cane as necessary to make walking comfortable, stable, and smooth. Stand tall with good posture and look ahead, not down at your feet. Hold the cane about 2 inches (5 cm) in front or to the side of you. Each time you take a step with your injured leg, move the cane at the same time to help balance you. Going up steps Step first with your stronger foot. Move the cane and the weaker foot up the step at the same time. Always hold the railing with your free hand. Going down steps Step down first with the cane and your weaker foot. Then follow with your stronger foot. Always hold the railing with your free hand. Safety tips for home Take these steps to make your home safer when you are walking with a cane: Be familiar with your home environment. Have sturdy handrails in your bathrooms and hallways. Wear nonslip, comfortable, well-fitting shoes. Use night-lights in the dark. Keep floor  surfaces clean and dry. Keep high-traffic areas uncluttered. Remove any rugs, cords, or loose objects from the floor. Contact a health care provider if: You still feel unsteady on your feet while using the cane. You develop new pain, such as pain in your back, shoulder, wrist, or hip. You develop any numbness or tingling. Get help right away if: You fall. Summary Using a cane makes you more stable, reduces pain, and eases strain on certain muscle groups. A cane fits properly if the top of the cane comes to your wrist joint when you are standing upright with your arm relaxed at your side. The best kind of cane for you depends on what you need it for. Hold your cane in the hand opposite the injured or weaker side. Always move the cane and the foot of the weaker side with each other (in unison). This information is not intended to replace advice given to you by your health care provider. Make sure you discuss any questions you have with your health care provider. Document Revised: 01/13/2020 Document Reviewed: 01/13/2020 Elsevier Patient Education  [place].

## 2022-03-29 NOTE — Progress Notes (Signed)
Virtual Visit via Telephone Note  I connected with April Price on 03/29/22 at  8:00 AM EST by telephone and verified that I am speaking with the correct person using two identifiers.  Location: Patient: Home Provider: Office  Persons participating in this telephone call: Webb Silversmith, NP and Shirl Harris   I discussed the limitations, risks, security and privacy concerns of performing an evaluation and management service by telephone and the availability of in person appointments. I also discussed with the patient that there may be a patient responsible charge related to this service. The patient expressed understanding and agreed to proceed.   History of Present Illness:  Patient reports bilateral leg pain and balance issues.  She reports this started 2 days after getting her RSV vaccine (03/10/22). She describes the pain as sharp and shooting. She has numbness, tingling in her upper and lower extremities. She reports that improved slightly and now just has her chronic numbness and tingling in her LLE. She has a history of lumbar radiculopathy and reports she got her SI joint injections (03/13/22). She reports her symptoms only occur with sitting, standing or walking. They do not occur with laying down. She feels unsteady like she is going to fall. She can walk with her walker but is unable to walk up and down steps. She denies loss of bowel or bladder control. She is not taking anything OTC for this, just her prescribed medications.   Past Medical History:  Diagnosis Date   Allergy    Anxiety    Arthritis    Asthma    Cataract    surgery both eyes in 2011   Chronic fatigue    Depression    Depression screen    Diffuse cystic mastopathy    Fibromyalgia    Fibromyalgia muscle pain    GERD (gastroesophageal reflux disease)    Heart murmur    History of kidney stones    Hyperlipidemia 11/21/2019   Hypertension    driven by stress and pain   Stage 3a chronic kidney  disease ([place]) 01/27/2021    Current Outpatient Medications  Medication Sig Dispense Refill   albuterol (PROVENTIL HFA;VENTOLIN HFA) 108 (90 Base) MCG/ACT inhaler Inhale 2 puffs into the lungs every 4 (four) hours as needed for wheezing or shortness of breath. 3 Inhaler 1   azelastine (ASTELIN) 0.1 % nasal spray Place 2 sprays into both nostrils 2 (two) times daily as needed for rhinitis. Use in each nostril as directed 30 mL 5   EPINEPHrine (EPIPEN 2-PAK) 0.3 mg/0.3 mL IJ SOAJ injection Inject 0.3 mLs (0.3 mg total) into the muscle once for 1 dose. 0.3 mL 0   esomeprazole (NEXIUM) 40 MG capsule TAKE 1 CAPSULE BY MOUTH EVERY DAY 90 capsule 1   fluticasone (FLONASE) 50 MCG/ACT nasal spray Place 2 sprays into both nostrils daily. 16 g 6   lisinopril (ZESTRIL) 5 MG tablet Take 1 tablet (5 mg total) by mouth daily. 90 tablet 1   montelukast (SINGULAIR) 10 MG tablet Take 10 mg by mouth at bedtime.     Multiple Vitamins-Minerals (PRESERVISION AREDS 2 PO) Take by mouth.     tiZANidine (ZANAFLEX) 4 MG tablet TAKE ONE TABLET BY MOUTH EVERY 8 HOURS AS NEEDED FOR MUSCLE SPASMS 270 tablet 0   traMADol (ULTRAM) 50 MG tablet Take 50 mg by mouth every 8 (eight) hours as needed.     No current facility-administered medications for this visit.    Allergies  Allergen Reactions  Penicillins Anaphylaxis    Has patient had a PCN reaction causing immediate rash, facial/tongue/throat swelling, SOB or lightheadedness with hypotension: Yes Has patient had a PCN reaction causing severe rash involving mucus membranes or skin necrosis: No Has patient had a PCN reaction that required hospitalization No Has patient had a PCN reaction occurring within the last 10 years: Yes If all of the above answers are "NO", then may proceed with Cephalosporin use.    Tylenol [Acetaminophen] Anaphylaxis, Itching and Hives    Ears and throat itch    Armoracia Rusticana Ext (Horseradish)    Morphine And Related Other (See Comments)     Depression    Nickel Itching   Pepcid [Famotidine] Nausea And Vomiting   Shrimp [Shellfish Allergy] Hives   Tape     irritation   Tetanus Toxoids Other (See Comments)    Swelling at injection site and fever   Wheat Bran    Corn-Containing Products     Has hay fever and swelling in fingers.   Gabapentin Other (See Comments)    Makes her feel "spacy" off balance, like she is coming out of anesthesia.     Family History  Problem Relation Age of Onset   Heart disease Mother    Arthritis Mother    Vision loss Mother    Heart disease Father    Stroke Father    Vascular Disease Sister    Heart disease Brother    Colon cancer Neg Hx    Breast cancer Neg Hx     Social History   Socioeconomic History   Marital status: Single    Spouse name: Not on file   Number of children: 0   Years of education: Not on file   Highest education level: Some college, no degree  Occupational History   Occupation: Disability  Tobacco Use   Smoking status: Every Day    Packs/day: 0.50    Years: 40.00    Total pack years: 20.00    Types: Cigarettes   Smokeless tobacco: Never  Vaping Use   Vaping Use: Never used  Substance and Sexual Activity   Alcohol use: Never   Drug use: Never   Sexual activity: Not Currently    Birth control/protection: None  Other Topics Concern   Not on file  Social History Narrative   Difficulty sitting, standing for prolong periods of time.    Social Determinants of Health   Financial Resource Strain: Medium Risk (03/24/2021)   Overall Financial Resource Strain (CARDIA)    Difficulty of Paying Living Expenses: Somewhat hard  Food Insecurity: No Food Insecurity (06/13/2021)   Hunger Vital Sign    Worried About Running Out of Food in the Last Year: Never true    Ran Out of Food in the Last Year: Never true  Transportation Needs: No Transportation Needs (06/13/2021)   PRAPARE - Hydrologist (Medical): No    Lack of Transportation  (Non-Medical): No  Physical Activity: Insufficiently Active (03/24/2021)   Exercise Vital Sign    Days of Exercise per Week: 2 days    Minutes of Exercise per Session: 30 min  Stress: No Stress Concern Present (06/13/2021)   [place]    Feeling of Stress : Only a little  Social Connections: Unknown (06/13/2021)   Social Connection and Isolation Panel [NHANES]    Frequency of Communication with Friends and Family: More than three times a week  Frequency of Social Gatherings with Friends and Family: Once a week    Attends Religious Services: More than 4 times per year    Active Member of [employer] or Organizations: Yes    Attends Archivist Meetings: More than 4 times per year    Marital Status: Patient refused  Intimate Partner Violence: Not At Risk (03/24/2021)   Humiliation, Afraid, Rape, and Kick questionnaire    Fear of Current or Ex-Partner: No    Emotionally Abused: No    Physically Abused: No    Sexually Abused: No     Constitutional: Denies fever, malaise, fatigue, headache or abrupt weight changes.  HEENT: Denies eye pain, eye redness, ear pain, ringing in the ears, wax buildup, runny nose, nasal congestion, bloody nose, or sore throat. Respiratory: Denies difficulty breathing, shortness of breath, cough or sputum production.   Cardiovascular: Denies chest pain, chest tightness, palpitations or swelling in the hands or feet.  Gastrointestinal: Denies loss of bowel control.  GU: Denies loss of bladder control. Musculoskeletal: Pt reports bilateral leg pain, difficulty with gait. Denies decrease in range of motion, muscle pain or joint swelling.  Skin: Denies redness, rashes, lesions or ulcercations.  Neurological: Patient reports paresthesia of LLE, balance problem.  Denies dizziness, difficulty with memory, difficulty with speech or problems with coordination.    No other specific complaints in a  complete review of systems (except as listed in HPI above).  Observations/Objective:   Wt Readings from Last 3 Encounters:  01/30/22 155 lb (70.3 kg)  12/20/21 149 lb (67.6 kg)  07/28/21 149 lb (67.6 kg)    General: In NAD. Pulmonary/Chest: No respiratory distress.  Neurological: Alert and oriented.   BMET    Component Value Date/Time   NA 138 01/30/2022 0856   K 4.6 02/14/2022 0859   CL 108 01/30/2022 0856   CO2 18 (L) 01/30/2022 0856   GLUCOSE 89 01/30/2022 0856   BUN 28 (H) 01/30/2022 0856   CREATININE 1.52 (H) 01/30/2022 0856   CALCIUM 9.9 01/30/2022 0856   GFRNONAA 50 (L) 11/21/2019 0829   GFRAA 57 (L) 11/21/2019 0829    Lipid Panel     Component Value Date/Time   CHOL 256 (H) 01/30/2022 0856   TRIG 162 (H) 01/30/2022 0856   HDL 52 01/30/2022 0856   CHOLHDL 4.9 01/30/2022 0856   LDLCALC 173 (H) 01/30/2022 0856    CBC    Component Value Date/Time   WBC 8.5 01/30/2022 0856   RBC 5.07 01/30/2022 0856   HGB 15.7 (H) 01/30/2022 0856   HCT 47.7 (H) 01/30/2022 0856   PLT 248 01/30/2022 0856   MCV 94.1 01/30/2022 0856   MCH 31.0 01/30/2022 0856   MCHC 32.9 01/30/2022 0856   RDW 12.9 01/30/2022 0856   LYMPHSABS 2,338 01/30/2022 0856   MONOABS 0.6 06/01/2016 0901   EOSABS 94 01/30/2022 0856   BASOSABS 60 01/30/2022 0856    Hgb A1C Lab Results  Component Value Date   HGBA1C 5.5 01/30/2022        Assessment and Plan:  Sciatica Left Side, Bilateral Leg Pain, Paresthesia of Bilateral Lower Extremities:  Advised her I did not think this was a side effect to her RSV vaccine This is likely worsening of her chronic back issues Discussed that it is very difficult to assess her range of motion, strength and balance over the phone and is something she would need to be seen in office for She reports she is unable to get out of  her house because she is unable to walk up and down steps.  Advised her if she is that weak that she needs to call EMS and be  transported to the ER for further evaluation of her symptoms She declines this and reports she will schedule an in office visit with me after Christmas.  RTC in 4 months for annual exam  Follow Up Instructions:    I discussed the assessment and treatment plan with the patient. The patient was provided an opportunity to ask questions and all were answered. The patient agreed with the plan and demonstrated an understanding of the instructions.   The patient was advised to call back or seek an in-person evaluation if the symptoms worsen or if the condition fails to improve as anticipated.  I provided 6:35 minutes of non-face-to-face time during this encounter.   Webb Silversmith, NP

## 2022-03-29 NOTE — Telephone Encounter (Signed)
I can not evaluate her balance through a phone call. She needs to be seen in person.

## 2022-05-03 ENCOUNTER — Other Ambulatory Visit: Payer: PPO

## 2022-05-08 ENCOUNTER — Other Ambulatory Visit: Payer: Self-pay | Admitting: Neurosurgery

## 2022-05-08 DIAGNOSIS — G894 Chronic pain syndrome: Secondary | ICD-10-CM | POA: Diagnosis not present

## 2022-05-08 DIAGNOSIS — F1120 Opioid dependence, uncomplicated: Secondary | ICD-10-CM | POA: Diagnosis not present

## 2022-05-08 DIAGNOSIS — Z9889 Other specified postprocedural states: Secondary | ICD-10-CM

## 2022-05-08 DIAGNOSIS — M461 Sacroiliitis, not elsewhere classified: Secondary | ICD-10-CM | POA: Diagnosis not present

## 2022-05-08 DIAGNOSIS — M797 Fibromyalgia: Secondary | ICD-10-CM | POA: Diagnosis not present

## 2022-05-13 ENCOUNTER — Ambulatory Visit
Admission: RE | Admit: 2022-05-13 | Discharge: 2022-05-13 | Disposition: A | Discharge status: Home / Self Care | Payer: PPO | Source: Ambulatory Visit | Attending: Neurosurgery | Admitting: Neurosurgery

## 2022-05-13 DIAGNOSIS — M48061 Spinal stenosis, lumbar region without neurogenic claudication: Secondary | ICD-10-CM | POA: Diagnosis not present

## 2022-05-13 DIAGNOSIS — M5136 Other intervertebral disc degeneration, lumbar region: Secondary | ICD-10-CM | POA: Diagnosis not present

## 2022-05-13 DIAGNOSIS — Z9889 Other specified postprocedural states: Secondary | ICD-10-CM | POA: Diagnosis not present

## 2022-05-13 MED ORDER — GADOBUTROL 1 MMOL/ML IV SOLN
7.0000 mL | Freq: Once | INTRAVENOUS | Status: AC | PRN
  Administered 2022-05-13: 7.5 mL via INTRAVENOUS

## 2022-05-17 DIAGNOSIS — Z6827 Body mass index (BMI) 27.0-27.9, adult: Secondary | ICD-10-CM | POA: Diagnosis not present

## 2022-05-17 DIAGNOSIS — M47816 Spondylosis without myelopathy or radiculopathy, lumbar region: Secondary | ICD-10-CM | POA: Diagnosis not present

## 2022-05-17 DIAGNOSIS — Z981 Arthrodesis status: Secondary | ICD-10-CM | POA: Diagnosis not present

## 2022-05-17 DIAGNOSIS — M5136 Other intervertebral disc degeneration, lumbar region: Secondary | ICD-10-CM | POA: Diagnosis not present

## 2022-05-26 ENCOUNTER — Telehealth: Payer: Self-pay | Admitting: Internal Medicine

## 2022-05-26 NOTE — Telephone Encounter (Signed)
Copied from [place]. Topic: Medicare AWV >> May 26, 2022 10:45 AM Devoria Glassing wrote: Reason for CRM: Left message for patient to schedule their Annual Wellness Visit (AWV) with Health Nurse Advisor Kirke Shaggy at [facility].  Please call [phone] ask for [facility]

## 2022-05-29 ENCOUNTER — Ambulatory Visit
Admission: RE | Admit: 2022-05-29 | Discharge: 2022-05-29 | Disposition: A | Discharge status: Home / Self Care | Payer: PPO | Source: Home / Self Care | Attending: Physician Assistant | Admitting: Physician Assistant

## 2022-05-29 ENCOUNTER — Encounter: Payer: Self-pay | Admitting: Physician Assistant

## 2022-05-29 ENCOUNTER — Ambulatory Visit
Admission: RE | Admit: 2022-05-29 | Discharge: 2022-05-29 | Disposition: A | Discharge status: Home / Self Care | Payer: PPO | Source: Ambulatory Visit | Attending: Physician Assistant | Admitting: Physician Assistant

## 2022-05-29 ENCOUNTER — Ambulatory Visit (INDEPENDENT_AMBULATORY_CARE_PROVIDER_SITE_OTHER): Payer: PPO | Admitting: Physician Assistant

## 2022-05-29 VITALS — BP 112/62 | HR 131 | Temp 96.8°F

## 2022-05-29 DIAGNOSIS — E860 Dehydration: Secondary | ICD-10-CM | POA: Diagnosis not present

## 2022-05-29 DIAGNOSIS — M797 Fibromyalgia: Secondary | ICD-10-CM | POA: Diagnosis not present

## 2022-05-29 DIAGNOSIS — I10 Essential (primary) hypertension: Secondary | ICD-10-CM | POA: Diagnosis not present

## 2022-05-29 DIAGNOSIS — R1032 Left lower quadrant pain: Secondary | ICD-10-CM | POA: Insufficient documentation

## 2022-05-29 DIAGNOSIS — R000 Tachycardia, unspecified: Secondary | ICD-10-CM | POA: Diagnosis not present

## 2022-05-29 DIAGNOSIS — E162 Hypoglycemia, unspecified: Secondary | ICD-10-CM | POA: Diagnosis not present

## 2022-05-29 DIAGNOSIS — R0602 Shortness of breath: Secondary | ICD-10-CM | POA: Insufficient documentation

## 2022-05-29 DIAGNOSIS — G9332 Myalgic encephalomyelitis/chronic fatigue syndrome: Secondary | ICD-10-CM | POA: Diagnosis not present

## 2022-05-29 DIAGNOSIS — R7989 Other specified abnormal findings of blood chemistry: Secondary | ICD-10-CM | POA: Diagnosis not present

## 2022-05-29 DIAGNOSIS — J449 Chronic obstructive pulmonary disease, unspecified: Secondary | ICD-10-CM | POA: Diagnosis not present

## 2022-05-29 DIAGNOSIS — E872 Acidosis, unspecified: Secondary | ICD-10-CM | POA: Diagnosis not present

## 2022-05-29 DIAGNOSIS — R0902 Hypoxemia: Secondary | ICD-10-CM | POA: Diagnosis not present

## 2022-05-29 DIAGNOSIS — N1832 Chronic kidney disease, stage 3b: Secondary | ICD-10-CM | POA: Diagnosis not present

## 2022-05-29 DIAGNOSIS — Z8249 Family history of ischemic heart disease and other diseases of the circulatory system: Secondary | ICD-10-CM | POA: Diagnosis not present

## 2022-05-29 DIAGNOSIS — E785 Hyperlipidemia, unspecified: Secondary | ICD-10-CM | POA: Diagnosis not present

## 2022-05-29 DIAGNOSIS — R3989 Other symptoms and signs involving the genitourinary system: Secondary | ICD-10-CM

## 2022-05-29 DIAGNOSIS — B9620 Unspecified Escherichia coli [E. coli] as the cause of diseases classified elsewhere: Secondary | ICD-10-CM | POA: Diagnosis not present

## 2022-05-29 DIAGNOSIS — N179 Acute kidney failure, unspecified: Secondary | ICD-10-CM | POA: Diagnosis not present

## 2022-05-29 DIAGNOSIS — R6883 Chills (without fever): Secondary | ICD-10-CM | POA: Diagnosis not present

## 2022-05-29 DIAGNOSIS — G894 Chronic pain syndrome: Secondary | ICD-10-CM | POA: Diagnosis not present

## 2022-05-29 DIAGNOSIS — R079 Chest pain, unspecified: Secondary | ICD-10-CM | POA: Diagnosis not present

## 2022-05-29 DIAGNOSIS — N200 Calculus of kidney: Secondary | ICD-10-CM | POA: Diagnosis present

## 2022-05-29 DIAGNOSIS — I129 Hypertensive chronic kidney disease with stage 1 through stage 4 chronic kidney disease, or unspecified chronic kidney disease: Secondary | ICD-10-CM | POA: Diagnosis not present

## 2022-05-29 DIAGNOSIS — N390 Urinary tract infection, site not specified: Secondary | ICD-10-CM | POA: Diagnosis not present

## 2022-05-29 DIAGNOSIS — R531 Weakness: Secondary | ICD-10-CM | POA: Diagnosis not present

## 2022-05-29 DIAGNOSIS — K219 Gastro-esophageal reflux disease without esophagitis: Secondary | ICD-10-CM | POA: Diagnosis not present

## 2022-05-29 DIAGNOSIS — F17210 Nicotine dependence, cigarettes, uncomplicated: Secondary | ICD-10-CM | POA: Diagnosis not present

## 2022-05-29 DIAGNOSIS — Z79899 Other long term (current) drug therapy: Secondary | ICD-10-CM | POA: Diagnosis not present

## 2022-05-29 DIAGNOSIS — F32A Depression, unspecified: Secondary | ICD-10-CM | POA: Diagnosis not present

## 2022-05-29 DIAGNOSIS — D631 Anemia in chronic kidney disease: Secondary | ICD-10-CM | POA: Diagnosis not present

## 2022-05-29 DIAGNOSIS — W06XXXA Fall from bed, initial encounter: Secondary | ICD-10-CM | POA: Diagnosis present

## 2022-05-29 DIAGNOSIS — T402X5A Adverse effect of other opioids, initial encounter: Secondary | ICD-10-CM | POA: Diagnosis not present

## 2022-05-29 DIAGNOSIS — R109 Unspecified abdominal pain: Secondary | ICD-10-CM | POA: Diagnosis not present

## 2022-05-29 DIAGNOSIS — T481X5A Adverse effect of skeletal muscle relaxants [neuromuscular blocking agents], initial encounter: Secondary | ICD-10-CM | POA: Diagnosis not present

## 2022-05-29 DIAGNOSIS — Z79891 Long term (current) use of opiate analgesic: Secondary | ICD-10-CM | POA: Diagnosis not present

## 2022-05-29 DIAGNOSIS — I959 Hypotension, unspecified: Secondary | ICD-10-CM | POA: Diagnosis not present

## 2022-05-29 MED ORDER — TAMSULOSIN HCL 0.4 MG PO CAPS: 0.4000 mg | ORAL_CAPSULE | Freq: Every day | ORAL | 0 refills | Status: DC

## 2022-05-29 NOTE — Progress Notes (Signed)
Acute Office Visit   Patient: April Price   DOB: [birth year]   67 y.o. Female  MRN: [id number] Visit Date: 05/29/2022  Today's healthcare provider: Dani Gobble Shermon Bozzi, PA-C  Introduced myself to the patient as a Journalist, newspaper and provided education on APPs in clinical practice.    Chief Complaint  Patient presents with   Chills        Urinary Tract Infection   Shortness of Breath        Subjective    HPI HPI     Chills    Additional comments:          Shortness of Breath    Additional comments:        Last edited by Marquavion Venhuizen, Dani Gobble, PA-C on 05/29/2022  1:42 PM.       Chills and SOB Onset: Duration: since this AM - reports having chills  Took temp at home: 98.6  Associated symptoms: Reports shaking with chills,   She reports baseline having thick mucus and allergies and admits to some chronic post-nasal drip   Increased use of rescue inhaler: does not recall last time she used it- has been several months   Recent sick contacts: denies recent sick contacts  COVID testing at home: none  Results: NA   Admits that she has not been drinking much in terms of fluids and thinks she is potentially dehydrated  When asked about her SOB and tachycardia she replied that wearing a mask and walking around previously with a walker/ cane were likely to blame I pointed out that patient has been sitting in her wheelchair in the exam room for the past approx 15 minutes so her HR should be improving to normal range by now   Concern for urinary symptoms/ LLQ pain   Onset: sudden Duration: last night  Associated symptoms: LLQ pain and side pain,  Reports pain was an 8/10 last night  She denies dysuria, hematuria,  She has a hx of kidney stones previously  Interventions: AZO, Tramadol   Had to redirect pt several times from trying to discuss ongoing back pain problems - she is seen by Neurosurgery for this regularly      Medications: Outpatient Medications  Prior to Visit  Medication Sig   albuterol (PROVENTIL HFA;VENTOLIN HFA) 108 (90 Base) MCG/ACT inhaler Inhale 2 puffs into the lungs every 4 (four) hours as needed for wheezing or shortness of breath.   azelastine (ASTELIN) 0.1 % nasal spray Place 2 sprays into both nostrils 2 (two) times daily as needed for rhinitis. Use in each nostril as directed   cyclobenzaprine (FLEXERIL) 5 MG tablet Take 5 mg by mouth 2 (two) times daily.   esomeprazole (NEXIUM) 40 MG capsule TAKE 1 CAPSULE BY MOUTH EVERY DAY   fluticasone (FLONASE) 50 MCG/ACT nasal spray Place 2 sprays into both nostrils daily.   lisinopril (ZESTRIL) 5 MG tablet Take 1 tablet (5 mg total) by mouth daily.   montelukast (SINGULAIR) 10 MG tablet Take 10 mg by mouth at bedtime.   Multiple Vitamins-Minerals (PRESERVISION AREDS 2 PO) Take by mouth.   traMADol (ULTRAM) 50 MG tablet Take 50 mg by mouth every 8 (eight) hours as needed.   EPINEPHrine (EPIPEN 2-PAK) 0.3 mg/0.3 mL IJ SOAJ injection Inject 0.3 mLs (0.3 mg total) into the muscle once for 1 dose.   tiZANidine (ZANAFLEX) 4 MG tablet TAKE ONE TABLET BY MOUTH EVERY 8 HOURS AS NEEDED FOR MUSCLE SPASMS (  Patient not taking: Reported on 05/29/2022)   No facility-administered medications prior to visit.    Review of Systems  Constitutional:  Positive for chills (and shakes). Negative for fever.  HENT:  Positive for postnasal drip. Negative for congestion and sore throat.   Respiratory:  Positive for cough and shortness of breath. Negative for wheezing.   Gastrointestinal:  Positive for abdominal pain.  Genitourinary:  Negative for decreased urine volume, difficulty urinating, dysuria, flank pain, hematuria, vaginal bleeding, vaginal discharge and vaginal pain.  Neurological:  Negative for dizziness and headaches.       Objective    BP 112/62 (BP Location: Left Arm, Patient Position: Sitting, Cuff Size: Normal)   Pulse (!) 131   Temp (!) 96.8 F (36 C) (Temporal)   SpO2 98%     Physical Exam Vitals reviewed.  Constitutional:      General: She is awake.     Appearance: Normal appearance. She is well-developed and well-groomed.  HENT:     Head: Normocephalic and atraumatic.  Eyes:     Extraocular Movements: Extraocular movements intact.     Conjunctiva/sclera: Conjunctivae normal.  Cardiovascular:     Rate and Rhythm: Regular rhythm. Tachycardia present.     Pulses: Normal pulses.          Radial pulses are 2+ on the right side and 2+ on the left side.     Heart sounds: Normal heart sounds. No murmur heard.    No friction rub. No gallop.  Pulmonary:     Effort: Tachypnea present.     Breath sounds: Decreased air movement present. Examination of the right-lower field reveals decreased breath sounds. Examination of the left-lower field reveals decreased breath sounds. Decreased breath sounds present. No wheezing, rhonchi or rales.  Abdominal:     General: Abdomen is flat. Bowel sounds are normal.     Palpations: Abdomen is soft.     Tenderness: There is no abdominal tenderness. There is no right CVA tenderness, left CVA tenderness, guarding or rebound.  Musculoskeletal:     Cervical back: Normal range of motion.  Neurological:     Mental Status: She is alert.  Psychiatric:        Behavior: Behavior is cooperative.       No results found for any visits on 05/29/22.  Assessment & Plan      No follow-ups on file.       Problem List Items Addressed This Visit   None Visit Diagnoses     SOB (shortness of breath)    -  Primary Unsure of chronicity, patient denies baseline SOB or wheezing During apt patient is showing tachypnea and some subjective increased work of breathing PE shows decreased breath sounds in lower bilateral lung fields and O2 was in lower 90s but increased to 98% at the end of vitals acquisition Patient was negative for COVID, Flu A and B in office today- reviewed results with her during apt Will order CXR, CMP, CBC today to  check for pneumonia, elevated white count, anemia  and electrolyte abnormalities Reviewed ED and return precautions given her tachycardia and chills Follow up as needed pending results     Relevant Orders   DG Chest 2 View   Chills (without fever)       Relevant Orders   CBC w/Diff/Platelet   COMPLETE METABOLIC PANEL WITH GFR   Urine Culture   LLQ pain     Acute, new concern since last night  She reports LLQ  pain but had to be redirected to this as she kept elaborating on her chronic back pain and difficulty walking She has a previous history of kidney stones so will get KUB for evaluation  UA was not accurate due to use of AZO and urine discoloration- will send for urine culture today to rule out UTI PE was overall unremarkable for pain or guarding today  Culture and KUB results to dictate further management Follow up as needed     Relevant Orders   DG Abd 1 View   POCT urinalysis dipstick   Urine Culture        No follow-ups on file.   I, Saphia Vanderford E Ruairi Stutsman, PA-C, have reviewed all documentation for this visit. The documentation on 05/29/22 for the exam, diagnosis, procedures, and orders are all accurate and complete.   Talitha Givens, MHS, PA-C [facility]

## 2022-05-29 NOTE — Addendum Note (Signed)
Addended by: Talitha Givens on: 05/29/2022 03:04 PM   Modules accepted: Orders

## 2022-05-29 NOTE — Progress Notes (Signed)
Your renal imaging revealed several stones in both kidneys but was not able to definitively determine if one has passed into the ureters or bladder. Given your symptoms, I think it is likely that you are passing one at this time. I have sent in a script for Flomax for you to take to help pass the stone. Remember to stay well-hydrated, at least 75 oz of water per day, and avoid holding your urine for prolonged periods of time. If you continue to have pain, the pain gets worse, or you have difficulty urinating, please let us know. We will keep you updated on your lab results once they are available.

## 2022-05-29 NOTE — Progress Notes (Signed)
Your chest xray demonstrates findings that are consistent with COPD but does not show acute illness such as pneumonia. At this time I believe your chills and shaking are likely due to a passing kidney stone. I have sent another message with the recommendations for that.

## 2022-05-30 LAB — CBC WITH DIFFERENTIAL/PLATELET
Absolute Monocytes: 1504 cells/uL — ABNORMAL HIGH (ref 200–950)
Basophils Absolute: 124 cells/uL (ref 0–200)
Basophils Relative: 0.6 %
Eosinophils Absolute: 0 cells/uL — ABNORMAL LOW (ref 15–500)
Eosinophils Relative: 0 %
HCT: 48.9 % — ABNORMAL HIGH (ref 35.0–45.0)
Hemoglobin: 16.9 g/dL — ABNORMAL HIGH (ref 11.7–15.5)
Lymphs Abs: 618 cells/uL — ABNORMAL LOW (ref 850–3900)
MCH: 30.8 pg (ref 27.0–33.0)
MCHC: 34.6 g/dL (ref 32.0–36.0)
MCV: 89.2 fL (ref 80.0–100.0)
MPV: 10.8 fL (ref 7.5–12.5)
Monocytes Relative: 7.3 %
Neutro Abs: 18355 cells/uL — ABNORMAL HIGH (ref 1500–7800)
Neutrophils Relative %: 89.1 %
Platelets: 256 10*3/uL (ref 140–400)
RBC: 5.48 10*6/uL — ABNORMAL HIGH (ref 3.80–5.10)
RDW: 12.8 % (ref 11.0–15.0)
Total Lymphocyte: 3 %
WBC: 20.6 10*3/uL — ABNORMAL HIGH (ref 3.8–10.8)

## 2022-05-30 LAB — COMPLETE METABOLIC PANEL WITH GFR
AG Ratio: 1.2 (calc) (ref 1.0–2.5)
ALT: 7 U/L (ref 6–29)
AST: 9 U/L — ABNORMAL LOW (ref 10–35)
Albumin: 3.7 g/dL (ref 3.6–5.1)
Alkaline phosphatase (APISO): 78 U/L (ref 37–153)
BUN/Creatinine Ratio: 15 (calc) (ref 6–22)
BUN: 25 mg/dL (ref 7–25)
CO2: 18 mmol/L — ABNORMAL LOW (ref 20–32)
Calcium: 9.4 mg/dL (ref 8.6–10.4)
Chloride: 105 mmol/L (ref 98–110)
Creat: 1.65 mg/dL — ABNORMAL HIGH (ref 0.50–1.05)
Globulin: 3.1 g/dL (calc) (ref 1.9–3.7)
Glucose, Bld: 94 mg/dL (ref 65–99)
Potassium: 4.3 mmol/L (ref 3.5–5.3)
Sodium: 136 mmol/L (ref 135–146)
Total Bilirubin: 0.7 mg/dL (ref 0.2–1.2)
Total Protein: 6.8 g/dL (ref 6.1–8.1)
eGFR: 34 mL/min/{1.73_m2} — ABNORMAL LOW (ref >=60)

## 2022-05-30 MED ORDER — SULFAMETHOXAZOLE-TRIMETHOPRIM 800-160 MG PO TABS: 1.0000 | ORAL_TABLET | Freq: Two times a day (BID) | ORAL | 0 refills | Status: DC

## 2022-05-30 NOTE — Progress Notes (Signed)
Your labs demonstrate highly elevated white blood cells which usually indicates infection. Given your concerns for left lower quadrant pain it is possible that you have a UTI or maybe a problem with a kidney stone getting infected. I am sending in a script for bactrim at this time. Please continue to take the Flomax to help with stone passage. If you continue to have left lower abdominal pain, shortness of breath and elevated heart rate, I recommend going to the ED for further evaluation and work up. Please schedule a follow up with your PCP in about 1 week to make sure you are feeling better

## 2022-05-30 NOTE — Addendum Note (Signed)
Addended by: Talitha Givens on: 05/30/2022 09:31 AM   Modules accepted: Orders

## 2022-05-31 LAB — URINE CULTURE
MICRO NUMBER:: 14583361
SPECIMEN QUALITY:: ADEQUATE

## 2022-06-01 ENCOUNTER — Emergency Department: Payer: PPO

## 2022-06-01 ENCOUNTER — Encounter: Payer: Self-pay | Admitting: Emergency Medicine

## 2022-06-01 ENCOUNTER — Inpatient Hospital Stay
Admission: EM | Admit: 2022-06-01 | Discharge: 2022-06-04 | DRG: 683 | Disposition: A | Discharge status: Home Health | Payer: PPO | Attending: Internal Medicine | Admitting: Internal Medicine

## 2022-06-01 ENCOUNTER — Other Ambulatory Visit: Payer: Self-pay

## 2022-06-01 DIAGNOSIS — M25552 Pain in left hip: Secondary | ICD-10-CM | POA: Diagnosis present

## 2022-06-01 DIAGNOSIS — D631 Anemia in chronic kidney disease: Secondary | ICD-10-CM | POA: Diagnosis present

## 2022-06-01 DIAGNOSIS — M797 Fibromyalgia: Secondary | ICD-10-CM | POA: Diagnosis present

## 2022-06-01 DIAGNOSIS — I129 Hypertensive chronic kidney disease with stage 1 through stage 4 chronic kidney disease, or unspecified chronic kidney disease: Secondary | ICD-10-CM | POA: Diagnosis present

## 2022-06-01 DIAGNOSIS — R7989 Other specified abnormal findings of blood chemistry: Secondary | ICD-10-CM | POA: Diagnosis present

## 2022-06-01 DIAGNOSIS — N390 Urinary tract infection, site not specified: Secondary | ICD-10-CM | POA: Diagnosis present

## 2022-06-01 DIAGNOSIS — W06XXXA Fall from bed, initial encounter: Secondary | ICD-10-CM | POA: Diagnosis present

## 2022-06-01 DIAGNOSIS — E785 Hyperlipidemia, unspecified: Secondary | ICD-10-CM | POA: Diagnosis present

## 2022-06-01 DIAGNOSIS — N179 Acute kidney failure, unspecified: Principal | ICD-10-CM | POA: Diagnosis present

## 2022-06-01 DIAGNOSIS — G9332 Myalgic encephalomyelitis/chronic fatigue syndrome: Secondary | ICD-10-CM | POA: Diagnosis present

## 2022-06-01 DIAGNOSIS — Z981 Arthrodesis status: Secondary | ICD-10-CM

## 2022-06-01 DIAGNOSIS — Z79891 Long term (current) use of opiate analgesic: Secondary | ICD-10-CM | POA: Diagnosis not present

## 2022-06-01 DIAGNOSIS — I959 Hypotension, unspecified: Secondary | ICD-10-CM | POA: Diagnosis present

## 2022-06-01 DIAGNOSIS — F32A Depression, unspecified: Secondary | ICD-10-CM | POA: Diagnosis present

## 2022-06-01 DIAGNOSIS — T402X5A Adverse effect of other opioids, initial encounter: Secondary | ICD-10-CM | POA: Diagnosis present

## 2022-06-01 DIAGNOSIS — Z885 Allergy status to narcotic agent status: Secondary | ICD-10-CM

## 2022-06-01 DIAGNOSIS — Z888 Allergy status to other drugs, medicaments and biological substances status: Secondary | ICD-10-CM

## 2022-06-01 DIAGNOSIS — K219 Gastro-esophageal reflux disease without esophagitis: Secondary | ICD-10-CM | POA: Diagnosis present

## 2022-06-01 DIAGNOSIS — G894 Chronic pain syndrome: Secondary | ICD-10-CM | POA: Diagnosis present

## 2022-06-01 DIAGNOSIS — N200 Calculus of kidney: Secondary | ICD-10-CM | POA: Diagnosis present

## 2022-06-01 DIAGNOSIS — E872 Acidosis, unspecified: Secondary | ICD-10-CM | POA: Diagnosis present

## 2022-06-01 DIAGNOSIS — Z8249 Family history of ischemic heart disease and other diseases of the circulatory system: Secondary | ICD-10-CM

## 2022-06-01 DIAGNOSIS — Z91018 Allergy to other foods: Secondary | ICD-10-CM

## 2022-06-01 DIAGNOSIS — T481X5A Adverse effect of skeletal muscle relaxants [neuromuscular blocking agents], initial encounter: Secondary | ICD-10-CM | POA: Diagnosis present

## 2022-06-01 DIAGNOSIS — F17210 Nicotine dependence, cigarettes, uncomplicated: Secondary | ICD-10-CM | POA: Diagnosis present

## 2022-06-01 DIAGNOSIS — E162 Hypoglycemia, unspecified: Secondary | ICD-10-CM | POA: Diagnosis present

## 2022-06-01 DIAGNOSIS — M25551 Pain in right hip: Secondary | ICD-10-CM | POA: Diagnosis present

## 2022-06-01 DIAGNOSIS — R197 Diarrhea, unspecified: Secondary | ICD-10-CM | POA: Diagnosis present

## 2022-06-01 DIAGNOSIS — Z90710 Acquired absence of both cervix and uterus: Secondary | ICD-10-CM

## 2022-06-01 DIAGNOSIS — Z79899 Other long term (current) drug therapy: Secondary | ICD-10-CM

## 2022-06-01 DIAGNOSIS — Z87442 Personal history of urinary calculi: Secondary | ICD-10-CM

## 2022-06-01 DIAGNOSIS — N1832 Chronic kidney disease, stage 3b: Secondary | ICD-10-CM | POA: Insufficient documentation

## 2022-06-01 DIAGNOSIS — B9620 Unspecified Escherichia coli [E. coli] as the cause of diseases classified elsewhere: Secondary | ICD-10-CM | POA: Diagnosis present

## 2022-06-01 DIAGNOSIS — Z886 Allergy status to analgesic agent status: Secondary | ICD-10-CM

## 2022-06-01 DIAGNOSIS — Z8261 Family history of arthritis: Secondary | ICD-10-CM

## 2022-06-01 DIAGNOSIS — G8929 Other chronic pain: Secondary | ICD-10-CM | POA: Diagnosis present

## 2022-06-01 DIAGNOSIS — Z9109 Other allergy status, other than to drugs and biological substances: Secondary | ICD-10-CM

## 2022-06-01 DIAGNOSIS — E860 Dehydration: Secondary | ICD-10-CM | POA: Diagnosis present

## 2022-06-01 DIAGNOSIS — R531 Weakness: Secondary | ICD-10-CM | POA: Diagnosis present

## 2022-06-01 DIAGNOSIS — Z91013 Allergy to seafood: Secondary | ICD-10-CM

## 2022-06-01 DIAGNOSIS — Z880 Allergy status to penicillin: Secondary | ICD-10-CM

## 2022-06-01 DIAGNOSIS — F419 Anxiety disorder, unspecified: Secondary | ICD-10-CM | POA: Diagnosis present

## 2022-06-01 DIAGNOSIS — Z881 Allergy status to other antibiotic agents status: Secondary | ICD-10-CM

## 2022-06-01 DIAGNOSIS — Z887 Allergy status to serum and vaccine status: Secondary | ICD-10-CM

## 2022-06-01 LAB — COMPREHENSIVE METABOLIC PANEL
ALT: 20 U/L (ref 0–44)
AST: 45 U/L — ABNORMAL HIGH (ref 15–41)
Albumin: 3 g/dL — ABNORMAL LOW (ref 3.5–5.0)
Alkaline Phosphatase: 106 U/L (ref 38–126)
Anion gap: 18 — ABNORMAL HIGH (ref 5–15)
BUN: 81 mg/dL — ABNORMAL HIGH (ref 8–23)
CO2: 12 mmol/L — ABNORMAL LOW (ref 22–32)
Calcium: 8.5 mg/dL — ABNORMAL LOW (ref 8.9–10.3)
Chloride: 101 mmol/L (ref 98–111)
Creatinine, Ser: 4.59 mg/dL — ABNORMAL HIGH (ref 0.44–1.00)
GFR, Estimated: 10 mL/min — ABNORMAL LOW (ref >60)
Glucose, Bld: 63 mg/dL — ABNORMAL LOW (ref 70–99)
Potassium: 4.4 mmol/L (ref 3.5–5.1)
Sodium: 131 mmol/L — ABNORMAL LOW (ref 135–145)
Total Bilirubin: 1.4 mg/dL — ABNORMAL HIGH (ref 0.3–1.2)
Total Protein: 6.2 g/dL — ABNORMAL LOW (ref 6.5–8.1)

## 2022-06-01 LAB — CBC
HCT: 40.1 % (ref 36.0–46.0)
Hemoglobin: 13.2 g/dL (ref 12.0–15.0)
MCH: 30.5 pg (ref 26.0–34.0)
MCHC: 32.9 g/dL (ref 30.0–36.0)
MCV: 92.6 fL (ref 80.0–100.0)
Platelets: 220 10*3/uL (ref 150–400)
RBC: 4.33 MIL/uL (ref 3.87–5.11)
RDW: 14.2 % (ref 11.5–15.5)
WBC: 14.4 10*3/uL — ABNORMAL HIGH (ref 4.0–10.5)
nRBC: 0 % (ref 0.0–0.2)

## 2022-06-01 LAB — TROPONIN I (HIGH SENSITIVITY)
Troponin I (High Sensitivity): 58 ng/L — ABNORMAL HIGH (ref <18)
Troponin I (High Sensitivity): 56 ng/L — ABNORMAL HIGH (ref <18)

## 2022-06-01 LAB — LIPASE, BLOOD: Lipase: 27 U/L (ref 11–51)

## 2022-06-01 MED ORDER — MONTELUKAST SODIUM 10 MG PO TABS
10.0000 mg | ORAL_TABLET | Freq: Every day | ORAL | Status: DC
  Administered 2022-06-01 – 2022-06-03 (×3): 10 mg via ORAL
  Filled 2022-06-01 (×3): qty 1

## 2022-06-01 MED ORDER — ALBUTEROL SULFATE (2.5 MG/3ML) 0.083% IN NEBU: 3.0000 mL | INHALATION_SOLUTION | RESPIRATORY_TRACT | Status: DC | PRN

## 2022-06-01 MED ORDER — DIPHENHYDRAMINE HCL 50 MG/ML IJ SOLN: 12.5000 mg | Freq: Four times a day (QID) | INTRAMUSCULAR | Status: DC | PRN

## 2022-06-01 MED ORDER — DEXTROSE 5 % IV SOLN: 250.0000 mL | INTRAVENOUS | Status: DC | PRN

## 2022-06-01 MED ORDER — OXYCODONE HCL 5 MG PO TABS
10.0000 mg | ORAL_TABLET | Freq: Four times a day (QID) | ORAL | Status: DC | PRN
  Administered 2022-06-02: 10 mg via ORAL
  Filled 2022-06-01: qty 2

## 2022-06-01 MED ORDER — ONDANSETRON HCL 4 MG PO TABS: 4.0000 mg | ORAL_TABLET | Freq: Four times a day (QID) | ORAL | Status: DC | PRN

## 2022-06-01 MED ORDER — FENTANYL CITRATE PF 50 MCG/ML IJ SOSY: 25.0000 ug | PREFILLED_SYRINGE | INTRAMUSCULAR | Status: DC | PRN

## 2022-06-01 MED ORDER — AZELASTINE HCL 0.1 % NA SOLN
2.0000 | Freq: Two times a day (BID) | NASAL | Status: DC | PRN
  Administered 2022-06-02 – 2022-06-04 (×3): 2 via NASAL
  Filled 2022-06-01: qty 30

## 2022-06-01 MED ORDER — SODIUM CHLORIDE 0.9 % IV SOLN: INTRAVENOUS | Status: AC

## 2022-06-01 MED ORDER — SENNOSIDES-DOCUSATE SODIUM 8.6-50 MG PO TABS: 1.0000 | ORAL_TABLET | Freq: Every evening | ORAL | Status: DC | PRN

## 2022-06-01 MED ORDER — HEPARIN SODIUM (PORCINE) 5000 UNIT/ML IJ SOLN
5000.0000 [IU] | Freq: Three times a day (TID) | INTRAMUSCULAR | Status: DC
  Administered 2022-06-01 – 2022-06-04 (×8): 5000 [IU] via SUBCUTANEOUS
  Filled 2022-06-01 (×8): qty 1

## 2022-06-01 MED ORDER — ONDANSETRON HCL 4 MG/2ML IJ SOLN: 4.0000 mg | Freq: Four times a day (QID) | INTRAMUSCULAR | Status: DC | PRN

## 2022-06-01 MED ORDER — SODIUM CHLORIDE 0.9 % IV SOLN
1.0000 g | Freq: Three times a day (TID) | INTRAVENOUS | Status: DC
  Administered 2022-06-01 – 2022-06-02 (×3): 1 g via INTRAVENOUS
  Filled 2022-06-01 (×3): qty 5

## 2022-06-01 MED ORDER — CYCLOBENZAPRINE HCL 10 MG PO TABS
5.0000 mg | ORAL_TABLET | Freq: Two times a day (BID) | ORAL | Status: DC
  Administered 2022-06-01 – 2022-06-02 (×2): 5 mg via ORAL
  Filled 2022-06-01 (×2): qty 1

## 2022-06-01 MED ORDER — SODIUM CHLORIDE 0.9 % IV BOLUS
1000.0000 mL | Freq: Once | INTRAVENOUS | Status: AC
  Administered 2022-06-01: 1000 mL via INTRAVENOUS

## 2022-06-01 MED ORDER — FLUTICASONE PROPIONATE 50 MCG/ACT NA SUSP: 2.0000 | Freq: Every day | NASAL | Status: DC | PRN

## 2022-06-01 MED ORDER — FENTANYL CITRATE PF 50 MCG/ML IJ SOSY
50.0000 ug | PREFILLED_SYRINGE | Freq: Once | INTRAMUSCULAR | Status: AC
  Administered 2022-06-01: 50 ug via INTRAVENOUS
  Filled 2022-06-01: qty 1

## 2022-06-01 NOTE — Consult Note (Signed)
Pharmacy Antibiotic Note  April Price is a 67 y.o. female admitted on 06/01/2022 with UTI.  Pharmacy has been consulted for aztreonam dosing. Pt with PCN allergy and AKI.  Scr 4.59 (range over the past year has been 1.33 - 1.65.  Plan: Aztreonam 1 gm IV Q8H  Weight: 75.2 kg (165 lb 11.2 oz)  Temp (24hrs), Avg:98.7 F (37.1 C), Min:98.6 F (37 C), Max:98.7 F (37.1 C)  Recent Labs  Lab 05/29/22 1408 06/01/22 1158  WBC 20.6* 14.4*  CREATININE 1.65* 4.59*    Estimated Creatinine Clearance: 11.7 mL/min (A) (by C-G formula based on SCr of 4.59 mg/dL (H)).    Allergies  Allergen Reactions   Penicillins Anaphylaxis    Has patient had a PCN reaction causing immediate rash, facial/tongue/throat swelling, SOB or lightheadedness with hypotension: Yes Has patient had a PCN reaction causing severe rash involving mucus membranes or skin necrosis: No Has patient had a PCN reaction that required hospitalization No Has patient had a PCN reaction occurring within the last 10 years: Yes If all of the above answers are "NO", then may proceed with Cephalosporin use.    Tylenol [Acetaminophen] Anaphylaxis, Itching and Hives    Ears and throat itch    Armoracia Rusticana Ext (Horseradish)    Keflex [Cephalexin] Other (See Comments)    Unspecified reaction per patient chart- she was started on Bactrim instead after 1-2 doses of Keflex    Morphine And Related Other (See Comments)    Depression    Nickel Itching   Pepcid [Famotidine] Nausea And Vomiting   Shrimp [Shellfish Allergy] Hives   Tape     irritation   Tetanus Toxoids Other (See Comments)    Swelling at injection site and fever   Wheat Bran    Corn-Containing Products     Has hay fever and swelling in fingers.   Gabapentin Other (See Comments)    Makes her feel "spacy" off balance, like she is coming out of anesthesia.     Antimicrobials this admission: Aztreonam 2/22 >>  Pt on po Bactrim PTA, held d/t AKI  Dose  adjustments this admission: N/A  Microbiology results: 2/22 UCx: ordered    Thank you for allowing pharmacy to be a part of this patient's care.  Alison Murray 06/01/2022 4:12 PM

## 2022-06-01 NOTE — ED Notes (Signed)
Lab called this RN back. Lab states that urine that resulted at 1500 came from another patient and was incorrectly placed in this patient's chart. Lab to be correcting chart and running new sample.

## 2022-06-01 NOTE — ED Notes (Signed)
Patient given water as requested.

## 2022-06-01 NOTE — ED Provider Notes (Signed)
[facility] Provider Note    Event Date/Time   First MD Initiated Contact with Patient 06/01/22 1156     (approximate)  History   Chief Complaint: Weakness  HPI  Velecia Kegler is a 67 y.o. female with a past medical history of anxiety, depression, fibromyalgia, hypertension, hyperlipidemia presents to the emergency department for generalized weakness.  According to the patient for the last several days she has been feeling very weak and fatigued.  She states she went to her doctor on Monday for similar symptoms was found to have a urinary tract infection and started on Bactrim.  Patient states she started this medication yesterday continued that yesterday and today.  Patient states she has been experiencing some intermittent left flank pain does have a history of kidney stones and was concerned she might have a kidney stone as well.  Patient denies any fever, chest pain, vomiting or diarrhea.  Physical Exam   Triage Vital Signs: ED Triage Vitals  Enc Vitals Group     BP 06/01/22 1156 (!) 98/52     Pulse Rate 06/01/22 1156 (!) 108     Resp 06/01/22 1156 18     Temp 06/01/22 1156 98.6 F (37 C)     Temp Source 06/01/22 1156 Oral     SpO2 06/01/22 1152 92 %     Weight --      Height --      Head Circumference --      Peak Flow --      Pain Score 06/01/22 1154 7     Pain Loc --      Pain Edu? --      Excl. in [place]? --     Most recent vital signs: Vitals:   06/01/22 1152 06/01/22 1156  BP:  (!) 98/52  Pulse:  (!) 108  Resp:  18  Temp:  98.6 F (37 C)  SpO2: 92% 91%    General: Awake, no distress.  CV:  Good peripheral perfusion.  Regular rate and rhythm around 100 bpm Resp:  Normal effort.  Equal breath sounds bilaterally.  Abd:  No distention.  Soft, nontender.  No rebound or guarding.   ED Results / Procedures / Treatments   EKG  EKG viewed and interpreted by myself shows sinus tachycardia 110 bpm with a narrow QRS, normal axis,  normal intervals, no concerning ST changes.  RADIOLOGY  I have reviewed and interpreted the chest x-ray images.  No consolidation seen on my evaluation. Radiology is read the chest x-ray as negative.   MEDICATIONS ORDERED IN ED: Medications  sodium chloride 0.9 % bolus 1,000 mL (1,000 mLs Intravenous New Bag/Given 06/01/22 1245)  fentaNYL (SUBLIMAZE) injection 50 mcg (50 mcg Intravenous Given 06/01/22 1240)     IMPRESSION / MDM / ASSESSMENT AND PLAN / ED COURSE  I reviewed the triage vital signs and the nursing notes.  Patient's presentation is most consistent with acute presentation with potential threat to life or bodily function.  Patient presents to the emergency department for generalized weakness and fatigue.  Patient was recently diagnosed with urinary tract infection and started on Bactrim yesterday.  Patient states left flank pain and history of kidney stones, we will obtain a CT renal scan to rule out ureterolithiasis.  We will check labs, urinalysis and continue to closely monitor.  Patient's lab work has resulted showing leukocytosis of 14,000 however this is considerably decreased from 3 days ago when it was 21,000.  Patient's  chemistry does show renal dysfunction with a creatinine of 4.59 significantly increased from 3 days ago when it was 1.69 consistent with acute renal failure.  Patient's troponin mildly elevated at 58 I believe this is more likely related to the patient's renal failure than his to a true ACS event.  Patient has no chest pain and a reassuring EKG.  Lipase is normal.  Difficult to say the cause of the patient's renal dysfunction/failure.  CT scan pending to evaluate for possible ureterolithiasis or obstructive pathology.  Anion gap is elevated we will begin IV hydration.  Awaiting urinalysis to rule out infection.  Given the acute renal failure patient will require admission to the hospital service.  We will discontinue Bactrim as this may worsen the patient's  renal dysfunction as well.  Repeat troponin largely unchanged.  Patient admitted to the hospital service for further workup and treatment  FINAL CLINICAL IMPRESSION(S) / ED DIAGNOSES   Acute renal failure Weakness   Note:  This document was prepared using Dragon voice recognition software and may include unintentional dictation errors.   Harvest Dark, MD 06/01/22 [phone]

## 2022-06-01 NOTE — Assessment & Plan Note (Signed)
-   Per patient this is worse than her baseline fatigue and weakness - Patient states this is generalized - Check serum B12 level

## 2022-06-01 NOTE — H&P (Signed)
History and Physical   April Price [id number] DOB: May 28, 1955 DOA: 06/01/2022  PCP: Jearld Fenton, NP  Outpatient Specialists: Dr. Chalmers Guest, [facility] Patient coming from: Home via EMS  I have personally briefly reviewed patient's old medical records in [place].  Chief Concern: Left flank pain with increasing weakness  HPI: April Price is a 67 year old female with history of hypertension, urinary retention, GERD, chronic pain syndrome, fibromyalgia, depression, anxiety, hyperlipidemia, generalized weakness, who presents to emergency department for chief concerns of increasing weakness and left flank pain.  Of note patient was diagnosed with urinary tract infection outpatient on 05/30/2022: Patient was prescribed Bactrim.  Patient took her first dose on 05/31/2022 1 additional dose today prior to ED presentation.  Initial vitals in the ED showed temperature 98 point respiration rate of 18, heart rate of 108, blood pressure 98/52, improved to 132/105, SpO2 of 91% on room air.  Serum sodium is 131, potassium 4.4, chloride 101, bicarb 12, BUN of 81, serum creatinine of 4.59, EGFR of 10, nonfasting blood glucose 63, WBC 14.4, hemoglobin 13.2, platelets of 220.  High sensitive troponin was 58.  ED treatment: Fentanyl 50 mcg one-time dose, sodium chloride 1 L bolus -------------------------- At bedside, she is able to tell me her name, her age, current calendar year, current location. She reprots that yesterday, she slid off the bed because she felt so weak, she could not put weight on her legs. She reports worsening bilateral back pain.   She endorses dysuria, diarrhea, blood in her urine. She takes the Azo tablets and her urine is now orange. She denies blood in her stool. She denies nausea, vomiting, new shortness of breath, chest pain, swelling in her legs, passing out, lost of consciousness, head trauma. She started taking the Azo tablets on Monday,  05/29/22.  Patient reports she took 1 dose of Bactrim on evening of 05/31/2022 and 1 dose of Bactrim in the a.m. on 06/01/22.  Note: During my evaluation, patient was laying on her back with a flat ED bed.  In the middle of the exam, patient was able to utilize her back, abdominal muscles, bilateral upper extremities, and lower extremities, fluid self up into a sitting position and then cross her legs.  Social history: She lives by her self. She performs her own adls. She endorses tobacco, half pack per day. She denies etoh, and recreational drug use. She is retired and formerly worked as an Therapist, sports, at [employer] as a Librarian, academic.  ROS: Constitutional: no weight change, no fever ENT/Mouth: no sore throat, no rhinorrhea Eyes: no eye pain, no vision changes Cardiovascular: no chest pain, no dyspnea,  no edema, no palpitations Respiratory: no cough, no sputum, no wheezing Gastrointestinal: no nausea, no vomiting, no diarrhea, no constipation Genitourinary: no urinary incontinence, + dysuria, no hematuria Musculoskeletal: no arthralgias, no myalgias, + back pain Skin: no skin lesions, no pruritus, Neuro: + weakness, no loss of consciousness, no syncope Psych: no anxiety, no depression, no decrease appetite Heme/Lymph: no bruising, no bleeding  ED Course: Discussed with emergency medicine provider, patient requiring hospitalization for chief concerns of acute kidney injury.  Assessment/Plan  Principal Problem:   AKI (acute kidney injury) ([place]) Active Problems:   Chronic pain syndrome   Chronic hip pain (Location of Secondary source of pain) (Bilateral) (L>R)   Fibromyalgia   Depression   Hypoglycemia   Chronic fatigue syndrome   GERD (gastroesophageal reflux disease)   Stage 3b chronic kidney disease (CKD) (  [place])   Elevated troponin   UTI (urinary tract infection) with pyuria   Assessment and Plan:  * AKI (acute kidney injury) ([place]) - On CKD 3B, serum creatinine range  over the last year has been 1.33-1.65/eGFR 34-44 - Etiology workup in progress, with patient presentation of hypotension, differential diagnosis include intrarenal secondary to Bactrim versus prerenal secondary to poor p.o. intake - Patient's blood pressure improved with sodium chloride 1 L bolus - I ordered sodium chloride 125 mL/h, 1 day ordered - Repeat BMP in the a.m.  Chronic pain syndrome With worsening weakness - Patient takes tramadol 25 mg every 4 hours as needed, this has been held on admission as I have ordered oxycodone 10 mg every 6 hours as needed for moderate pain, 4 doses ordered - Cyclobenzaprine 5 mg p.o. twice daily resumed - Low clinical suspicion for stroke at this time as patient does not have focal deficits. Her strength is 5 out of 5 on all extremities, negative for facial asymmetry/drooping, pupils equal and reactive to light - Check B12 level  UTI (urinary tract infection) with pyuria - Holding home Bactrim at this time in setting of acute kidney injury - Urine culture on 05/29/2022 resulted - Aztreonam per pharmacy ordered  Elevated troponin - Etiology workup in progress, differentials include demand ischemia in setting of acute kidney injury and hypotension - Will follow second high sensitive troponin, the patient's troponin continue to have a positive delta, I will order complete echo to assess for possible cardiorenal syndrome  Chronic fatigue syndrome - Per patient this is worse than her baseline fatigue and weakness - Patient states this is generalized - Check serum B12 level  Hypoglycemia - Dextrose 5% 250 mL IV as needed for hypoglycemia ordered  Chart reviewed.   DVT prophylaxis: Heparin 5000 units subcutaneous every 8 hours Code Status: full code  Diet: Renal diet, no fluid restriction at this time Family Communication: Updated sister, Bethena Roys at bedside with patient's verbal permission. Disposition Plan: Pending clinical course Consults called:  None at this time Admission status: Telemetry medical, inpatient  Past Medical History:  Diagnosis Date   Allergy    Anxiety    Arthritis    Asthma    Cataract    surgery both eyes in 2011   Chronic fatigue    Depression    Depression screen    Diffuse cystic mastopathy    Fibromyalgia    Fibromyalgia muscle pain    GERD (gastroesophageal reflux disease)    Heart murmur    History of kidney stones    Hyperlipidemia 11/21/2019   Hypertension    driven by stress and pain   Stage 3a chronic kidney disease ([place]) 01/27/2021   Past Surgical History:  Procedure Laterality Date   ABDOMINAL HYSTERECTOMY     BACK SURGERY     Spinal Fusion   BREAST BIOPSY Right    cataract     CHOLECYSTECTOMY     COLONOSCOPY  2006   EYE SURGERY Bilateral 2011   cataract   FRACTURE SURGERY  2018   left wrist   NASAL SINUS SURGERY     NASAL SINUS SURGERY     OPEN REDUCTION INTERNAL FIXATION (ORIF) DISTAL RADIAL FRACTURE Left 06/05/2016   Procedure: OPEN REDUCTION INTERNAL FIXATION (ORIF) DISTAL RADIAL FRACTURE;  Surgeon: Earnestine Leys, MD;  Location: [facility] ORS;  Service: Orthopedics;  Laterality: Left;  Hand Innovations needed   [place]  2003, 2012   TYMPANOSTOMY TUBE PLACEMENT  Social History:  reports that she has been smoking cigarettes. She has a 20.00 pack-year smoking history. She has never used smokeless tobacco. She reports that she does not currently use alcohol. She reports that she does not use drugs.  Allergies  Allergen Reactions   Penicillins Anaphylaxis    Has patient had a PCN reaction causing immediate rash, facial/tongue/throat swelling, SOB or lightheadedness with hypotension: Yes Has patient had a PCN reaction causing severe rash involving mucus membranes or skin necrosis: No Has patient had a PCN reaction that required hospitalization No Has patient had a PCN reaction occurring within the last 10 years: Yes If all of the above answers are "NO", then may proceed with  Cephalosporin use.    Tylenol [Acetaminophen] Anaphylaxis, Itching and Hives    Ears and throat itch    Armoracia Rusticana Ext (Horseradish)    Keflex [Cephalexin] Other (See Comments)    Unspecified reaction per patient chart- she was started on Bactrim instead after 1-2 doses of Keflex    Morphine And Related Other (See Comments)    Depression    Nickel Itching   Pepcid [Famotidine] Nausea And Vomiting   Shrimp [Shellfish Allergy] Hives   Tape     irritation   Tetanus Toxoids Other (See Comments)    Swelling at injection site and fever   Wheat Bran    Corn-Containing Products     Has hay fever and swelling in fingers.   Gabapentin Other (See Comments)    Makes her feel "spacy" off balance, like she is coming out of anesthesia.    Family History  Problem Relation Age of Onset   Heart disease Mother    Arthritis Mother    Vision loss Mother    Heart disease Father    Stroke Father    Vascular Disease Sister    Heart disease Brother    Colon cancer Neg Hx    Breast cancer Neg Hx    Family history: Family history reviewed and not pertinent.  Prior to Admission medications   Medication Sig Start Date End Date Taking? Authorizing Provider  albuterol (PROVENTIL HFA;VENTOLIN HFA) 108 (90 Base) MCG/ACT inhaler Inhale 2 puffs into the lungs every 4 (four) hours as needed for wheezing or shortness of breath. 05/02/18   Mikey College, NP  azelastine (ASTELIN) 0.1 % nasal spray Place 2 sprays into both nostrils 2 (two) times daily as needed for rhinitis. Use in each nostril as directed 01/27/21   Jearld Fenton, NP  cyclobenzaprine (FLEXERIL) 5 MG tablet Take 5 mg by mouth 2 (two) times daily.    [provider]  EPINEPHrine (EPIPEN 2-PAK) 0.3 mg/0.3 mL IJ SOAJ injection Inject 0.3 mLs (0.3 mg total) into the muscle once for 1 dose. 07/25/19 07/28/19  Malfi, Lupita Raider, FNP  esomeprazole (NEXIUM) 40 MG capsule TAKE 1 CAPSULE BY MOUTH EVERY DAY 01/30/22   Mecum, Erin E,  PA-C  fluticasone (FLONASE) 50 MCG/ACT nasal spray Place 2 sprays into both nostrils daily. 01/27/21   Jearld Fenton, NP  lisinopril (ZESTRIL) 5 MG tablet Take 1 tablet (5 mg total) by mouth daily. 01/30/22   Mecum, Erin E, PA-C  montelukast (SINGULAIR) 10 MG tablet Take 10 mg by mouth at bedtime.    [provider]  Multiple Vitamins-Minerals (PRESERVISION AREDS 2 PO) Take by mouth.    [provider]  sulfamethoxazole-trimethoprim (BACTRIM DS) 800-160 MG tablet Take 1 tablet by mouth 2 (two) times daily for 10 days. 05/30/22  06/09/22  Mecum, Erin E, PA-C  tamsulosin (FLOMAX) 0.4 MG CAPS capsule Take 1 capsule (0.4 mg total) by mouth daily for 15 days. 05/29/22 06/13/22  Mecum, Erin E, PA-C  tiZANidine (ZANAFLEX) 4 MG tablet TAKE ONE TABLET BY MOUTH EVERY 8 HOURS AS NEEDED FOR MUSCLE SPASMS Patient not taking: Reported on 05/29/2022 01/20/22   Jearld Fenton, NP  traMADol (ULTRAM) 50 MG tablet Take 50 mg by mouth every 8 (eight) hours as needed. 04/13/20   [provider]   Physical Exam: Vitals:   06/01/22 1330 06/01/22 1424 06/01/22 1528 06/01/22 1528  BP: (!) 132/105 94/76 (!) 86/73   Pulse: (!) 108 93 (!) 101   Resp: 19 20 (!) 22   Temp:    98.7 F (37.1 C)  TempSrc:    Oral  SpO2: 90% 96% 100%    Constitutional: appears older than chronological age, NAD, calm, comfortable Eyes: PERRL, lids and conjunctivae normal ENMT: Mucous membranes are moist. Posterior pharynx clear of any exudate or lesions. Age-appropriate dentition. Hearing appropriate Neck: normal, supple, no masses, no thyromegaly Respiratory: clear to auscultation bilaterally, no wheezing, no crackles. Normal respiratory effort. No accessory muscle use.  Cardiovascular: Regular rate and rhythm, no murmurs / rubs / gallops. No extremity edema. 2+ pedal pulses. No carotid bruits.  Abdomen: Morbidly obese abdomen, no tenderness, no masses palpated, no hepatosplenomegaly. Bowel sounds positive.   Musculoskeletal: no clubbing / cyanosis. No joint deformity upper and lower extremities. Good ROM, no contractures, no atrophy. Normal muscle tone.  Skin: no rashes, lesions, ulcers. No induration Neurologic: Sensation intact. Strength 5/5 in all 4.  Psychiatric: Normal judgment and insight. Alert and oriented x 3. Normal mood.   EKG: independently reviewed, showing sinus tachycardia with rate of 110, QTc 458  Chest x-ray on Admission: I personally reviewed and I agree with radiologist reading as below.  CT Renal Stone Study  Result Date: 06/01/2022 CLINICAL DATA:  LEFT-sided abdominal pain. EXAM: CT ABDOMEN AND PELVIS WITHOUT CONTRAST TECHNIQUE: Multidetector CT imaging of the abdomen and pelvis was performed following the standard protocol without IV contrast. RADIATION DOSE REDUCTION: This exam was performed according to the departmental dose-optimization program which includes automated exposure control, adjustment of the mA and/or kV according to patient size and/or use of iterative reconstruction technique. COMPARISON:  None Available. FINDINGS: Lower chest: Lung bases are clear. Hepatobiliary: No focal hepatic lesion. Normal gallbladder. No biliary duct dilatation. Common bile duct is normal. Pancreas: Pancreas is normal. No ductal dilatation. No pancreatic inflammation. Spleen: Normal spleen Adrenals/urinary tract: Adrenal glands normal. RIGHT kidney atrophic. Several calculi within the RIGHT hand LEFT kidney without obstruction. LEFT ureter normal. RIGHT ureter normal. No bladder calculi. Stomach/Bowel: Stomach, small bowel, appendix, and cecum are normal. The colon and rectosigmoid colon are normal. Vascular/Lymphatic: Abdominal aorta is normal caliber with atherosclerotic calcification. There is no retroperitoneal or periportal lymphadenopathy. No pelvic lymphadenopathy. Reproductive: Post hysterectomy.  Adnexa unremarkable Other: No free fluid. Musculoskeletal: No aggressive osseous lesion.  Posterior lumbar fusion. IMPRESSION: 1. No acute findings in the abdomen pelvis. 2. Bilateral nonobstructing renal calculi. 3. Atrophic RIGHT kidney. 4.  Aortic Atherosclerosis (ICD10-I70.0). Electronically Signed   By: Suzy Bouchard M.D.   On: 06/01/2022 13:07   DG Chest Portable 1 View  Result Date: 06/01/2022 CLINICAL DATA:  Weakness EXAM: PORTABLE CHEST 1 VIEW COMPARISON:  None Available. FINDINGS: No pleural effusion. No pneumothorax. Unchanged cardiac and mediastinal contours. No focal airspace opacity. No radiographically apparent displaced rib fractures. Visualized upper  abdomen is unremarkable. IMPRESSION: No focal airspace opacity. Electronically Signed   By: Marin Roberts M.D.   On: 06/01/2022 12:46    Labs on Admission: I have personally reviewed following labs  CBC: Recent Labs  Lab 05/29/22 1408 06/01/22 1158  WBC 20.6* 14.4*  NEUTROABS 18,355*  --   HGB 16.9* 13.2  HCT 48.9* 40.1  MCV 89.2 92.6  PLT 256 [id number]   Basic Metabolic Panel: Recent Labs  Lab 05/29/22 1408 06/01/22 1158  NA 136 131*  K 4.3 4.4  CL 105 101  CO2 18* 12*  GLUCOSE 94 63*  BUN 25 81*  CREATININE 1.65* 4.59*  CALCIUM 9.4 8.5*   GFR: CrCl cannot be calculated (Unknown ideal weight.).  Liver Function Tests: Recent Labs  Lab 05/29/22 1408 06/01/22 1158  AST 9* 45*  ALT 7 20  ALKPHOS  --  106  BILITOT 0.7 1.4*  PROT 6.8 6.2*  ALBUMIN  --  3.0*   Recent Labs  Lab 06/01/22 1158  LIPASE 27   Urine analysis:    Component Value Date/Time   COLORURINE YELLOW (A) 06/01/2022 1343   APPEARANCEUR CLEAR (A) 06/01/2022 1343   LABSPEC 1.009 06/01/2022 1343   PHURINE 7.0 06/01/2022 1343   GLUCOSEU NEGATIVE 06/01/2022 1343   HGBUR SMALL (A) 06/01/2022 1343   BILIRUBINUR NEGATIVE 06/01/2022 1343   BILIRUBINUR negative 07/04/2019 0918   KETONESUR NEGATIVE 06/01/2022 1343   PROTEINUR 30 (A) 06/01/2022 1343   UROBILINOGEN 0.2 07/04/2019 0918   NITRITE NEGATIVE 06/01/2022 1343    LEUKOCYTESUR NEGATIVE 06/01/2022 1343   This document was prepared using Dragon Voice Recognition software and may include unintentional dictation errors.  Dr. Tobie Poet [facility]  If 7PM-7AM, please contact overnight-coverage provider If 7AM-7PM, please contact day coverage provider [email or web address]  06/01/2022, 3:47 PM

## 2022-06-01 NOTE — Assessment & Plan Note (Signed)
-   Etiology workup in progress, differentials include demand ischemia in setting of acute kidney injury and hypotension - Will follow second high sensitive troponin, the patient's troponin continue to have a positive delta, I will order complete echo to assess for possible cardiorenal syndrome

## 2022-06-01 NOTE — Hospital Course (Addendum)
April Price is a 67 year old female with history of hypertension, urinary retention, GERD, chronic pain syndrome, fibromyalgia, depression, anxiety, hyperlipidemia, generalized weakness, who presents to emergency department for chief concerns of increasing weakness and left flank pain.  Of note patient was diagnosed with urinary tract infection outpatient on 05/30/2022: Patient was prescribed Bactrim.  Patient took her first dose on 05/31/2022 1 additional dose today prior to ED presentation.  Initial vitals in the ED showed temperature 98 point respiration rate of 18, heart rate of 108, blood pressure 98/52, improved to 132/105, SpO2 of 91% on room air.  Serum sodium is 131, potassium 4.4, chloride 101, bicarb 12, BUN of 81, serum creatinine of 4.59, EGFR of 10, nonfasting blood glucose 63, WBC 14.4, hemoglobin 13.2, platelets of 220.  High sensitive troponin was 58.  ED treatment: Fentanyl 50 mcg one-time dose, sodium chloride 1 L bolus

## 2022-06-01 NOTE — Assessment & Plan Note (Signed)
-   On CKD 3B, serum creatinine range over the last year has been 1.33-1.65/eGFR 34-44 - Etiology workup in progress, with patient presentation of hypotension, differential diagnosis include intrarenal secondary to Bactrim versus prerenal secondary to poor p.o. intake - Patient's blood pressure improved with sodium chloride 1 L bolus - I ordered sodium chloride 125 mL/h, 1 day ordered - Repeat BMP in the a.m.

## 2022-06-01 NOTE — Progress Notes (Signed)
Your urine culture shows you have a UTI caused by E.coli  It is susceptible to the Bactrim that we started you on so please continue to take that and finish the entire course to ensure it has completely cleared.

## 2022-06-01 NOTE — Assessment & Plan Note (Addendum)
-   Holding home Bactrim at this time in setting of acute kidney injury - Urine culture on 05/29/2022 resulted - Aztreonam per pharmacy ordered

## 2022-06-01 NOTE — ED Triage Notes (Signed)
Patient to ED via [facility] from home for increased weakness over the past few days. Did see PCP Monday where she was dx with a UTI- currently taking bacterium. Patient states she has been unable to walk and stand like normal. Hx of bulging disk in back

## 2022-06-01 NOTE — ED Notes (Addendum)
Lab called at this time- urine that has resulted did not come this patient. Patient has not urinated at this visit until now with sample sent to lab at this time. Elsie Amis, Charge RN made aware. Per lab, they will investigate. New UA order placed for sample that was sent from this patient. Cox, DO made aware.

## 2022-06-01 NOTE — Assessment & Plan Note (Signed)
-   Dextrose 5% 250 mL IV as needed for hypoglycemia ordered

## 2022-06-01 NOTE — Assessment & Plan Note (Addendum)
With worsening weakness - Patient takes tramadol 25 mg every 4 hours as needed, this has been held on admission as I have ordered oxycodone 10 mg every 6 hours as needed for moderate pain, 4 doses ordered - Cyclobenzaprine 5 mg p.o. twice daily resumed - Low clinical suspicion for stroke at this time as patient does not have focal deficits. Her strength is 5 out of 5 on all extremities, negative for facial asymmetry/drooping, pupils equal and reactive to light - Check B12 level

## 2022-06-02 DIAGNOSIS — N179 Acute kidney failure, unspecified: Secondary | ICD-10-CM | POA: Diagnosis not present

## 2022-06-02 LAB — CBC
HCT: 35.1 % — ABNORMAL LOW (ref 36.0–46.0)
Hemoglobin: 11.4 g/dL — ABNORMAL LOW (ref 12.0–15.0)
MCH: 30.2 pg (ref 26.0–34.0)
MCHC: 32.5 g/dL (ref 30.0–36.0)
MCV: 93.1 fL (ref 80.0–100.0)
Platelets: 195 10*3/uL (ref 150–400)
RBC: 3.77 MIL/uL — ABNORMAL LOW (ref 3.87–5.11)
RDW: 14.3 % (ref 11.5–15.5)
WBC: 10 10*3/uL (ref 4.0–10.5)
nRBC: 0 % (ref 0.0–0.2)

## 2022-06-02 LAB — URINALYSIS, COMPLETE (UACMP) WITH MICROSCOPIC
Bilirubin Urine: NEGATIVE
Crystals: NONE SEEN
Glucose, UA: NEGATIVE mg/dL
Ketones, ur: NEGATIVE mg/dL
Nitrite: POSITIVE — AB
Protein, ur: 100 mg/dL — AB
Specific Gravity, Urine: 1.014 (ref 1.005–1.030)
WBC, UA: >50 WBC/hpf (ref 0–5)
pH: 5 (ref 5.0–8.0)

## 2022-06-02 LAB — BASIC METABOLIC PANEL
Anion gap: 14 (ref 5–15)
BUN: 76 mg/dL — ABNORMAL HIGH (ref 8–23)
CO2: 12 mmol/L — ABNORMAL LOW (ref 22–32)
Calcium: 7.5 mg/dL — ABNORMAL LOW (ref 8.9–10.3)
Chloride: 106 mmol/L (ref 98–111)
Creatinine, Ser: 3.99 mg/dL — ABNORMAL HIGH (ref 0.44–1.00)
GFR, Estimated: 12 mL/min — ABNORMAL LOW (ref >60)
Glucose, Bld: 62 mg/dL — ABNORMAL LOW (ref 70–99)
Potassium: 4.4 mmol/L (ref 3.5–5.1)
Sodium: 132 mmol/L — ABNORMAL LOW (ref 135–145)

## 2022-06-02 LAB — GLUCOSE, CAPILLARY: Glucose-Capillary: 71 mg/dL (ref 70–99)

## 2022-06-02 LAB — VITAMIN B12: Vitamin B-12: 448 pg/mL (ref 180–914)

## 2022-06-02 MED ORDER — SODIUM CHLORIDE 0.9 % IV BOLUS
500.0000 mL | Freq: Once | INTRAVENOUS | Status: AC
  Administered 2022-06-02: 500 mL via INTRAVENOUS

## 2022-06-02 MED ORDER — TRAMADOL HCL 50 MG PO TABS
50.0000 mg | ORAL_TABLET | Freq: Four times a day (QID) | ORAL | Status: DC | PRN
  Administered 2022-06-03 – 2022-06-04 (×3): 50 mg via ORAL
  Filled 2022-06-02 (×3): qty 1

## 2022-06-02 MED ORDER — MIDODRINE HCL 5 MG PO TABS
10.0000 mg | ORAL_TABLET | Freq: Three times a day (TID) | ORAL | Status: DC
  Administered 2022-06-02 – 2022-06-03 (×4): 10 mg via ORAL
  Filled 2022-06-02 (×4): qty 2

## 2022-06-02 MED ORDER — SODIUM CHLORIDE 0.9 % IV SOLN: INTRAVENOUS | Status: DC

## 2022-06-02 MED ORDER — CIPROFLOXACIN HCL 500 MG PO TABS
500.0000 mg | ORAL_TABLET | Freq: Every day | ORAL | Status: DC
  Administered 2022-06-02 – 2022-06-04 (×3): 500 mg via ORAL
  Filled 2022-06-02 (×4): qty 1

## 2022-06-02 NOTE — Progress Notes (Signed)
  PROGRESS NOTE    April Price  [id number] DOB: 06-16-1955 DOA: 06/01/2022 PCP: Jearld Fenton, NP  [id number]  LOS: 1 day   Brief hospital course:   Assessment & Plan: April Price is a 67 year old female with history of hypertension, urinary retention, GERD, chronic pain syndrome, fibromyalgia, depression, anxiety, hyperlipidemia, generalized weakness, who presents to emergency department for chief concerns of increasing weakness and left flank pain.    * AKI (acute kidney injury) ([place]) CKD 3b --Cr 4.59 on presentation, was 1.65 three days prior. - Etiology workup in progress, with patient presentation of hypotension, differential diagnosis include intrarenal secondary to Bactrim versus prerenal secondary to poor p.o. intake --Ct renal stone shows atrophic right kidney and no hydronephrosis.  --nephrology consulted --s/p IVF boluses x 3L so far --cont MIVF@75$  --hold Bactrim and NSAIDs.   UTI (urinary tract infection) with pyuria - urine cx from 2/19 PTA showed E coli with resistance only to levaquin, however, pt has several allergies.  Started on Bactrim PTA. --abx switched to Aztreonam on admission Plan: --switch abx to Cipro  Excessive sleepiness --likely due to flexeril and opioids  --hold flexeril --d/c oxycodone and resume home tramdol  Chronic pain syndrome on opioids --cont home tramadol PRN --hold flexeril due to excess sleepiness  Worsening weakness --PT  Elevated troponin - trop 50's x2.  Likely supply demand mismatch.  Chronic fatigue syndrome - Per patient this is worse than her baseline fatigue and weakness - Patient states this is generalized  Hypoglycemia - likely 2/2 poor oral intake   DVT prophylaxis: Heparin SQ Code Status: Full code  Family Communication: sister updated at bedside today Level of care: Telemetry Medical Dispo:   The patient is from: home Anticipated d/c is to: home Anticipated d/c date is: 2-3  days   Subjective and Interval History:  Pt has been very sleepy, but can wake up to answer all questions.  Denied dysuria.    BP has been low despite IVF.  Left arm about 20 points lower than right arm.   Objective: Vitals:   06/02/22 1228 06/02/22 1229 06/02/22 1400 06/02/22 1559  BP: (!) 96/47  98/68 (!) 91/50  Pulse: (!) 110   (!) 110  Resp: 20   20  Temp: 98 F (36.7 C)   97.8 F (36.6 C)  TempSrc: Oral   Oral  SpO2: (!) 88% 94%  93%  Weight:        Intake/Output Summary (Last 24 hours) at 06/02/2022 1618 Last data filed at 06/02/2022 0500 Gross per 24 hour  Intake 2997.08 ml  Output 403 ml  Net 2594.08 ml   Filed Weights   06/01/22 1557  Weight: 75.2 kg    Examination:   Constitutional: NAD, sleepy but arousable, oriented HEENT: conjunctivae and lids normal, EOMI CV: No cyanosis.   RESP: normal respiratory effort, on RA SKIN: warm, dry   Data Reviewed: I have personally reviewed labs and imaging studies  Time spent: 50 minutes  Enzo Bi, MD [facility] If 7PM-7AM, please contact night-coverage 06/02/2022, 4:18 PM

## 2022-06-02 NOTE — Consult Note (Signed)
[facility]  CONSULT NOTE    Date: 06/02/2022                  Patient Name:  April Price  MRN: [id number]  DOB: Nov 22, 1955  Age / Sex: 67 y.o., female         PCP: Jearld Fenton, NP                 Service Requesting Consult: [place]                 Reason for Consult: Acute kidney injury            History of Present Illness: Ms. April Price is a 67 y.o.  female with past medical history of GERD, hyperlipidemia, weakness, chronic pain syndrome, depression, fibromyaligia, who was admitted to [facility] on 06/01/2022 for AKI (acute kidney injury) ([place]) [N17.9] Acute renal failure, unspecified acute renal failure type ([place]) [N17.9]  Patient seen sitting up in bed, sister at bedside. She states she was not feeling well and had weakness preventing ambulation. She was recently diagnosed with a UTI and began taking Bactrim for treatment. States, due to her pain, she take Tramadol and Ibuprofen '400mg'$  daily. She has taken Ibuprofen for years, larger doses initially. Denies nausea, vomiting or diarrhea. States she's a light eater. Denies shortness of breath, chest or abd pain.   Labs on ED arrival concerning for creatinine 4.59 with GFR 10. IVF started in ED. CT renal stone shows atrophic right kidney and bilateral non-obstructing stones.   Medications: Outpatient medications: Medications Prior to Admission  Medication Sig Dispense Refill Last Dose   albuterol (PROVENTIL HFA;VENTOLIN HFA) 108 (90 Base) MCG/ACT inhaler Inhale 2 puffs into the lungs every 4 (four) hours as needed for wheezing or shortness of breath. 3 Inhaler 1 unknown   azelastine (ASTELIN) 0.1 % nasal spray Place 2 sprays into both nostrils 2 (two) times daily as needed for rhinitis. Use in each nostril as directed 30 mL 5 06/01/2022   cyclobenzaprine (FLEXERIL) 5 MG tablet Take 5 mg by mouth 2 (two) times daily.   06/01/2022   esomeprazole (NEXIUM) 40 MG capsule TAKE 1 CAPSULE BY MOUTH EVERY  DAY 90 capsule 1 06/01/2022   fluticasone (FLONASE) 50 MCG/ACT nasal spray Place 2 sprays into both nostrils daily. 16 g 6 05/31/2022   lisinopril (ZESTRIL) 5 MG tablet Take 1 tablet (5 mg total) by mouth daily. 90 tablet 1 06/01/2022   montelukast (SINGULAIR) 10 MG tablet Take 10 mg by mouth at bedtime.   05/31/2022   Multiple Vitamins-Minerals (PRESERVISION AREDS 2 PO) Take 1 capsule by mouth 2 (two) times daily.   06/01/2022   sulfamethoxazole-trimethoprim (BACTRIM DS) 800-160 MG tablet Take 1 tablet by mouth 2 (two) times daily for 10 days. 20 tablet 0 06/01/2022   tamsulosin (FLOMAX) 0.4 MG CAPS capsule Take 1 capsule (0.4 mg total) by mouth daily for 15 days. 15 capsule 0 05/31/2022   traMADol (ULTRAM) 50 MG tablet Take 50 mg by mouth every 8 (eight) hours as needed.   06/01/2022   EPINEPHrine (EPIPEN 2-PAK) 0.3 mg/0.3 mL IJ SOAJ injection Inject 0.3 mLs (0.3 mg total) into the muscle once for 1 dose. 0.3 mL 0    tiZANidine (ZANAFLEX) 4 MG tablet TAKE ONE TABLET BY MOUTH EVERY 8 HOURS AS NEEDED FOR MUSCLE SPASMS (Patient not taking: Reported on 05/29/2022) 270 tablet 0 Not Taking    Current medications: Current Facility-Administered Medications  Medication  Dose Route Frequency Provider Last Rate Last Admin   0.9 %  sodium chloride infusion   Intravenous Continuous Cox, Amy N, DO 125 mL/hr at 06/02/22 0500 Infusion Verify at 06/02/22 0500   albuterol (PROVENTIL) (2.5 MG/3ML) 0.083% nebulizer solution 3 mL  3 mL Inhalation Q4H PRN Cox, Amy N, DO       azelastine (ASTELIN) 0.1 % nasal spray 2 spray  2 spray Each Nare BID PRN Cox, Amy N, DO   2 spray at 06/02/22 0944   ciprofloxacin (CIPRO) tablet 500 mg  500 mg Oral Daily Enzo Bi, MD   500 mg at 06/02/22 1230   cyclobenzaprine (FLEXERIL) tablet 5 mg  5 mg Oral BID Cox, Amy N, DO   5 mg at 06/02/22 0837   dextrose 5 % solution 250 mL  250 mL Intravenous PRN Cox, Amy N, DO       diphenhydrAMINE (BENADRYL) injection 12.5 mg  12.5 mg Intravenous Q6H PRN  Cox, Amy N, DO       fluticasone (FLONASE) 50 MCG/ACT nasal spray 2 spray  2 spray Each Nare Daily PRN Cox, Amy N, DO       heparin injection 5,000 Units  5,000 Units Subcutaneous Q8H Cox, Amy N, DO   5,000 Units at 06/02/22 0501   midodrine (PROAMATINE) tablet 10 mg  10 mg Oral TID WC Enzo Bi, MD   10 mg at 06/02/22 1229   montelukast (SINGULAIR) tablet 10 mg  10 mg Oral QHS Cox, Amy N, DO   10 mg at 06/01/22 2133   ondansetron (ZOFRAN) tablet 4 mg  4 mg Oral Q6H PRN Cox, Amy N, DO       Or   ondansetron (ZOFRAN) injection 4 mg  4 mg Intravenous Q6H PRN Cox, Amy N, DO       senna-docusate (Senokot-S) tablet 1 tablet  1 tablet Oral QHS PRN Cox, Amy N, DO       traMADol (ULTRAM) tablet 50 mg  50 mg Oral Q6H PRN Enzo Bi, MD          Allergies: Allergies  Allergen Reactions   Keflex [Cephalexin] Anaphylaxis    Unspecified reaction per patient chart- she was started on Bactrim instead after 1-2 doses of Keflex   Patient says she had anaphylactic rxn   Penicillins Anaphylaxis    Has patient had a PCN reaction causing immediate rash, facial/tongue/throat swelling, SOB or lightheadedness with hypotension: Yes Has patient had a PCN reaction causing severe rash involving mucus membranes or skin necrosis: No Has patient had a PCN reaction that required hospitalization No Has patient had a PCN reaction occurring within the last 10 years: Yes If all of the above answers are "NO", then may proceed with Cephalosporin use.    Tylenol [Acetaminophen] Anaphylaxis, Itching and Hives    Ears and throat itch    Armoracia Rusticana Ext (Horseradish)    Morphine And Related Other (See Comments)    Depression    Nickel Itching   Pepcid [Famotidine] Nausea And Vomiting   Shrimp [Shellfish Allergy] Hives   Tape     irritation   Tetanus Toxoids Other (See Comments)    Swelling at injection site and fever   Wheat Bran    Gabapentin Other (See Comments)    Makes her feel "spacy" off balance, like  she is coming out of anesthesia.       Past Medical History: Past Medical History:  Diagnosis Date   Allergy  Anxiety    Arthritis    Asthma    Cataract    surgery both eyes in 2011   Chronic fatigue    Depression    Depression screen    Diffuse cystic mastopathy    Fibromyalgia    Fibromyalgia muscle pain    GERD (gastroesophageal reflux disease)    Heart murmur    History of kidney stones    Hyperlipidemia 11/21/2019   Hypertension    driven by stress and pain   Stage 3a chronic kidney disease ([place]) 01/27/2021     Past Surgical History: Past Surgical History:  Procedure Laterality Date   ABDOMINAL HYSTERECTOMY     BACK SURGERY     Spinal Fusion   BREAST BIOPSY Right    cataract     CHOLECYSTECTOMY     COLONOSCOPY  2006   EYE SURGERY Bilateral 2011   cataract   FRACTURE SURGERY  2018   left wrist   NASAL SINUS SURGERY     NASAL SINUS SURGERY     OPEN REDUCTION INTERNAL FIXATION (ORIF) DISTAL RADIAL FRACTURE Left 06/05/2016   Procedure: OPEN REDUCTION INTERNAL FIXATION (ORIF) DISTAL RADIAL FRACTURE;  Surgeon: Earnestine Leys, MD;  Location: [facility] ORS;  Service: Orthopedics;  Laterality: Left;  Hand Innovations needed   SPINE SURGERY  2003, 2012   TYMPANOSTOMY TUBE PLACEMENT       Family History: Family History  Problem Relation Age of Onset   Heart disease Mother    Arthritis Mother    Vision loss Mother    Heart disease Father    Stroke Father    Vascular Disease Sister    Heart disease Brother    Colon cancer Neg Hx    Breast cancer Neg Hx      Social History: Social History   Socioeconomic History   Marital status: Single    Spouse name: Not on file   Number of children: 0   Years of education: Not on file   Highest education level: Some college, no degree  Occupational History   Occupation: Disability  Tobacco Use   Smoking status: Every Day    Packs/day: 0.50    Years: 40.00    Total pack years: 20.00    Types: Cigarettes    Smokeless tobacco: Never  Vaping Use   Vaping Use: Never used  Substance and Sexual Activity   Alcohol use: Not Currently   Drug use: Never   Sexual activity: Not Currently    Birth control/protection: None  Other Topics Concern   Not on file  Social History Narrative   Difficulty sitting, standing for prolong periods of time.    Social Determinants of Health   Financial Resource Strain: Medium Risk (03/24/2021)   Overall Financial Resource Strain (CARDIA)    Difficulty of Paying Living Expenses: Somewhat hard  Food Insecurity: No Food Insecurity (06/01/2022)   Hunger Vital Sign    Worried About Running Out of Food in the Last Year: Never true    Ran Out of Food in the Last Year: Never true  Transportation Needs: No Transportation Needs (06/01/2022)   PRAPARE - Hydrologist (Medical): No    Lack of Transportation (Non-Medical): No  Physical Activity: Insufficiently Active (03/24/2021)   Exercise Vital Sign    Days of Exercise per Week: 2 days    Minutes of Exercise per Session: 30 min  Stress: No Stress Concern Present (06/13/2021)   [place] -  Occupational Stress Questionnaire    Feeling of Stress : Only a little  Social Connections: Unknown (06/13/2021)   Social Connection and Isolation Panel [NHANES]    Frequency of Communication with Friends and Family: More than three times a week    Frequency of Social Gatherings with Friends and Family: Once a week    Attends Religious Services: More than 4 times per year    Active Member of [employer] or Organizations: Yes    Attends Archivist Meetings: More than 4 times per year    Marital Status: Patient refused  Intimate Partner Violence: Not At Risk (06/01/2022)   Humiliation, Afraid, Rape, and Kick questionnaire    Fear of Current or Ex-Partner: No    Emotionally Abused: No    Physically Abused: No    Sexually Abused: No     Review of Systems: Review of Systems   Constitutional:  Positive for malaise/fatigue. Negative for chills and fever.  HENT:  Negative for congestion, sore throat and tinnitus.   Eyes:  Negative for blurred vision and redness.  Respiratory:  Negative for cough, shortness of breath and wheezing.   Cardiovascular:  Negative for chest pain, palpitations, claudication and leg swelling.  Gastrointestinal:  Negative for abdominal pain, blood in stool, diarrhea, nausea and vomiting.  Genitourinary:  Negative for flank pain, frequency and hematuria.  Musculoskeletal:  Negative for back pain, falls and myalgias.  Skin:  Negative for rash.  Neurological:  Positive for weakness. Negative for dizziness and headaches.  Endo/Heme/Allergies:  Does not bruise/bleed easily.  Psychiatric/Behavioral:  Negative for depression. The patient is not nervous/anxious and does not have insomnia.     Vital Signs: Blood pressure (!) 96/47, pulse (!) 110, temperature 98 F (36.7 C), temperature source Oral, resp. rate 20, weight 75.2 kg, SpO2 94 %.  Weight trends: Filed Weights   06/01/22 1557  Weight: 75.2 kg    Physical Exam: General: NAD, sitting up in bed  Head: Normocephalic, atraumatic. Moist oral mucosal membranes  Eyes: Anicteric  Lungs:  Clear to auscultation, normal effort  Heart: Regular rate and rhythm  Abdomen:  Soft, nontender  Extremities:  No peripheral edema.  Neurologic: Alert and oriented moving all four extremities  Skin: No lesions  Access: None     Lab results: Basic Metabolic Panel: Recent Labs  Lab 05/29/22 1408 06/01/22 1158 06/02/22 0242  NA 136 131* 132*  K 4.3 4.4 4.4  CL 105 101 106  CO2 18* 12* 12*  GLUCOSE 94 63* 62*  BUN 25 81* 76*  CREATININE 1.65* 4.59* 3.99*  CALCIUM 9.4 8.5* 7.5*    Liver Function Tests: Recent Labs  Lab 05/29/22 1408 06/01/22 1158  AST 9* 45*  ALT 7 20  ALKPHOS  --  106  BILITOT 0.7 1.4*  PROT 6.8 6.2*  ALBUMIN  --  3.0*   Recent Labs  Lab 06/01/22 1158  LIPASE  27   No results for input(s): "AMMONIA" in the last 168 hours.  CBC: Recent Labs  Lab 05/29/22 1408 06/01/22 1158 06/02/22 0242  WBC 20.6* 14.4* 10.0  NEUTROABS 18,355*  --   --   HGB 16.9* 13.2 11.4*  HCT 48.9* 40.1 35.1*  MCV 89.2 92.6 93.1  PLT 256 220 195    Cardiac Enzymes: No results for input(s): "CKTOTAL", "CKMB", "CKMBINDEX", "TROPONINI" in the last 168 hours.  BNP: Invalid input(s): "POCBNP"  CBG: Recent Labs  Lab 06/02/22 0557  GLUCAP 71    Microbiology: Results for orders  placed or performed in visit on 05/29/22  Urine Culture     Status: Abnormal   Collection Time: 05/29/22  2:40 PM   Specimen: Urine  Result Value Ref Range Status   MICRO NUMBER: [id number]  Final   SPECIMEN QUALITY: Adequate  Final   Sample Source URINE, CLEAN CATCH  Final   STATUS: FINAL  Final   ISOLATE 1: Escherichia coli (A)  Final    Comment: Greater than 100,000 CFU/mL of Escherichia coli      Susceptibility   Escherichia coli - URINE CULTURE, REFLEX    AMOX/CLAVULANIC <=2 Sensitive     AMPICILLIN 4 Sensitive     AMPICILLIN/SULBACTAM <=2 Sensitive     CEFAZOLIN* <=4 Not Reportable      * For infections other than uncomplicated UTI caused by E. coli, K. pneumoniae or P. mirabilis: Cefazolin is resistant if MIC > or = 8 mcg/mL. (Distinguishing susceptible versus intermediate for isolates with MIC < or = 4 mcg/mL requires additional testing.) For uncomplicated UTI caused by E. coli, K. pneumoniae or P. mirabilis: Cefazolin is susceptible if MIC <32 mcg/mL and predicts susceptible to the oral agents cefaclor, cefdinir, cefpodoxime, cefprozil, cefuroxime, cephalexin and loracarbef.     CEFTAZIDIME <=1 Sensitive     CEFEPIME <=1 Sensitive     CEFTRIAXONE <=1 Sensitive     CIPROFLOXACIN <=0.25 Sensitive     LEVOFLOXACIN 1 Intermediate     GENTAMICIN <=1 Sensitive     IMIPENEM <=0.25 Sensitive     NITROFURANTOIN <=16 Sensitive     PIP/TAZO <=4 Sensitive     TOBRAMYCIN  <=1 Sensitive     TRIMETH/SULFA* <=20 Sensitive      * For infections other than uncomplicated UTI caused by E. coli, K. pneumoniae or P. mirabilis: Cefazolin is resistant if MIC > or = 8 mcg/mL. (Distinguishing susceptible versus intermediate for isolates with MIC < or = 4 mcg/mL requires additional testing.) For uncomplicated UTI caused by E. coli, K. pneumoniae or P. mirabilis: Cefazolin is susceptible if MIC <32 mcg/mL and predicts susceptible to the oral agents cefaclor, cefdinir, cefpodoxime, cefprozil, cefuroxime, cephalexin and loracarbef. Legend: S = Susceptible  I = Intermediate R = Resistant  NS = Not susceptible * = Not tested  NR = Not reported **NN = See antimicrobic comments     Coagulation Studies: No results for input(s): "LABPROT", "INR" in the last 72 hours.  Urinalysis: Recent Labs    06/01/22 1542  COLORURINE AMBER*  LABSPEC 1.014  PHURINE 5.0  GLUCOSEU NEGATIVE  HGBUR MODERATE*  BILIRUBINUR NEGATIVE  KETONESUR NEGATIVE  PROTEINUR 100*  NITRITE POSITIVE*  LEUKOCYTESUR MODERATE*      Imaging: CT Renal Stone Study  Result Date: 06/01/2022 CLINICAL DATA:  LEFT-sided abdominal pain. EXAM: CT ABDOMEN AND PELVIS WITHOUT CONTRAST TECHNIQUE: Multidetector CT imaging of the abdomen and pelvis was performed following the standard protocol without IV contrast. RADIATION DOSE REDUCTION: This exam was performed according to the departmental dose-optimization program which includes automated exposure control, adjustment of the mA and/or kV according to patient size and/or use of iterative reconstruction technique. COMPARISON:  None Available. FINDINGS: Lower chest: Lung bases are clear. Hepatobiliary: No focal hepatic lesion. Normal gallbladder. No biliary duct dilatation. Common bile duct is normal. Pancreas: Pancreas is normal. No ductal dilatation. No pancreatic inflammation. Spleen: Normal spleen Adrenals/urinary tract: Adrenal glands normal. RIGHT kidney  atrophic. Several calculi within the RIGHT hand LEFT kidney without obstruction. LEFT ureter normal. RIGHT ureter normal. No bladder calculi. Stomach/Bowel: Stomach,  small bowel, appendix, and cecum are normal. The colon and rectosigmoid colon are normal. Vascular/Lymphatic: Abdominal aorta is normal caliber with atherosclerotic calcification. There is no retroperitoneal or periportal lymphadenopathy. No pelvic lymphadenopathy. Reproductive: Post hysterectomy.  Adnexa unremarkable Other: No free fluid. Musculoskeletal: No aggressive osseous lesion. Posterior lumbar fusion. IMPRESSION: 1. No acute findings in the abdomen pelvis. 2. Bilateral nonobstructing renal calculi. 3. Atrophic RIGHT kidney. 4.  Aortic Atherosclerosis (ICD10-I70.0). Electronically Signed   By: Suzy Bouchard M.D.   On: 06/01/2022 13:07   DG Chest Portable 1 View  Result Date: 06/01/2022 CLINICAL DATA:  Weakness EXAM: PORTABLE CHEST 1 VIEW COMPARISON:  None Available. FINDINGS: No pleural effusion. No pneumothorax. Unchanged cardiac and mediastinal contours. No focal airspace opacity. No radiographically apparent displaced rib fractures. Visualized upper abdomen is unremarkable. IMPRESSION: No focal airspace opacity. Electronically Signed   By: Marin Roberts M.D.   On: 06/01/2022 12:46     Assessment & Plan: Ms. Tyann Acorn is a 67 y.o.  female with past medical history of GERD, hyperlipidemia, weakness, chronic pain syndrome, depression, fibromyaligia, who was admitted to [facility] on 06/01/2022 for AKI (acute kidney injury) ([place]) [N17.9] Acute renal failure, unspecified acute renal failure type ([place]) [N17.9]  Acute kidney injury with chronic kidney disease IIIB. Baseline creatinine appears to be 1.52 with GFR 38 on 07/28/21. AKI appears to be multifactorial from antibiotic and NSAID use. Ct renal stone shows atrophic right kidney and no hydronephrosis. Agree with holding Bactrim and NSAIDs. Continue IVF for now and patient  encouraged to improve oral intake. No acute need for dialysis for now. Patient will need outpatient follow up with our office at discharge.   2. Anemia of chronic kidney disease Lab Results  Component Value Date   HGB 11.4 (L) 06/02/2022   Hgb within desired range.   3. UTI with pyuria, Bactrim held due to kidney injury. Aztreonam ordered.   LOS: 1 Sharmaine Bain 2/23/202412:31 PM

## 2022-06-03 DIAGNOSIS — N179 Acute kidney failure, unspecified: Secondary | ICD-10-CM | POA: Diagnosis not present

## 2022-06-03 LAB — CBC
HCT: 36.4 % (ref 36.0–46.0)
Hemoglobin: 11.9 g/dL — ABNORMAL LOW (ref 12.0–15.0)
MCH: 30.6 pg (ref 26.0–34.0)
MCHC: 32.7 g/dL (ref 30.0–36.0)
MCV: 93.6 fL (ref 80.0–100.0)
Platelets: 201 10*3/uL (ref 150–400)
RBC: 3.89 MIL/uL (ref 3.87–5.11)
RDW: 14.9 % (ref 11.5–15.5)
WBC: 8.4 10*3/uL (ref 4.0–10.5)
nRBC: 0 % (ref 0.0–0.2)

## 2022-06-03 LAB — BASIC METABOLIC PANEL
Anion gap: 11 (ref 5–15)
BUN: 66 mg/dL — ABNORMAL HIGH (ref 8–23)
CO2: 11 mmol/L — ABNORMAL LOW (ref 22–32)
Calcium: 8.4 mg/dL — ABNORMAL LOW (ref 8.9–10.3)
Chloride: 116 mmol/L — ABNORMAL HIGH (ref 98–111)
Creatinine, Ser: 2.84 mg/dL — ABNORMAL HIGH (ref 0.44–1.00)
GFR, Estimated: 18 mL/min — ABNORMAL LOW (ref >60)
Glucose, Bld: 67 mg/dL — ABNORMAL LOW (ref 70–99)
Potassium: 4.5 mmol/L (ref 3.5–5.1)
Sodium: 138 mmol/L (ref 135–145)

## 2022-06-03 LAB — MAGNESIUM: Magnesium: 2.1 mg/dL (ref 1.7–2.4)

## 2022-06-03 MED ORDER — MIDODRINE HCL 5 MG PO TABS
5.0000 mg | ORAL_TABLET | Freq: Three times a day (TID) | ORAL | Status: DC
  Administered 2022-06-03 (×2): 5 mg via ORAL
  Filled 2022-06-03 (×2): qty 1

## 2022-06-03 MED ORDER — SODIUM BICARBONATE 8.4 % IV SOLN
INTRAVENOUS | Status: DC
  Filled 2022-06-03: qty 1000
  Filled 2022-06-03: qty 150

## 2022-06-03 NOTE — Evaluation (Signed)
Physical Therapy Evaluation Patient Details Name: April Price MRN: [id number] DOB: [birth year] Today's Date: 06/03/2022  History of Present Illness  presented to ER secondary to L flank pain, generalized weakness; admitted for management of AKI, UTI.  Clinical Impression  Patient seated on [facility] with RN/CNA at bedside upon arrival to room.  Transitioned care to therapist for completion of toileting needs and further mobility assessment (cga/min assist for all activities). Patient alert and oriented to basic information, follows commands and agreeable to participation with treatment session. Denies pain at this time.  Bilat UE/LE strength and ROM grossly symmetrical and WFL for basic transfers and gait; no focal weakness appreciated. Able to complete bed mobility with mod indep; sit/stand, basic transfers and gait (30') with RW, cga/min assist.  Demonstrates mild forward trunk flexion; reciprocal stepping pattern with fair step height/length; decreased cadence, but generally steady without buckling, LOB or safety concern. Mod SOB wtih minimal gait distance; sats 88% on RA after gait trial. Does recover >90% within 30-60 seconds of seated rest and pursed lip breathing. Will continue to monitor with additional gait trials  Would benefit from skilled PT to address above deficits and promote optimal return to PLOF.; Recommend transition to HHPT upon discharge from acute hospitalization.        Recommendations for follow up therapy are one component of a multi-disciplinary discharge planning process, led by the attending physician.  Recommendations may be updated based on patient status, additional functional criteria and insurance authorization.  Follow Up Recommendations Home health PT      Assistance Recommended at Discharge Intermittent Supervision/Assistance  Patient can return home with the following  A little help with walking and/or transfers;A little help with  bathing/dressing/bathroom    Equipment Recommendations  (has (and prefers) [id number])  Recommendations for Other Services       Functional Status Assessment Patient has had a recent decline in their functional status and demonstrates the ability to make significant improvements in function in a reasonable and predictable amount of time.     Precautions / Restrictions Precautions Precautions: Fall Restrictions Weight Bearing Restrictions: No      Mobility  Bed Mobility Overal bed mobility: Modified Independent                  Transfers Overall transfer level: Needs assistance Equipment used: Rolling walker (2 wheels) Transfers: Sit to/from Stand Sit to Stand: Min guard, Min assist           General transfer comment: cuing for hand placement    Ambulation/Gait Ambulation/Gait assistance: Min guard, Min assist Gait Distance (Feet): 30 Feet Assistive device: Rolling walker (2 wheels)         General Gait Details: mild forward trunk flexion; reciprocal stepping pattern with fair step height/length; decreased cadence, but generally steady without buckling, LOB or safety concern. Mod SOB wtih minimal gait distance; sats 88% on RA after gait trial. Does recover >90% within 30-60 seconds of seated rest and pursed lip breathing. Will continue to monitor with additional gait trials  Stairs            Wheelchair Mobility    Modified Rankin (Stroke Patients Only)       Balance Overall balance assessment: Needs assistance Sitting-balance support: No upper extremity supported, Feet supported Sitting balance-Leahy Scale: Good     Standing balance support: Bilateral upper extremity supported Standing balance-Leahy Scale: Fair  Pertinent Vitals/Pain Pain Assessment Pain Assessment: No/denies pain    Home Living Family/patient expects to be discharged to:: Private residence Living Arrangements: Alone Available Help  at Discharge: Family;Available PRN/intermittently Type of Home: House Home Access: Ramped entrance       Home Layout: Two level;Able to live on main level with bedroom/bathroom Home Equipment: Rollator (4 wheels);Cane - single point      Prior Function Prior Level of Function : Independent/Modified Independent             Mobility Comments: Ambulatory with SPC since mid-December, recently transitioned to RW due to current weakness; denies history of falls outside of this episode ADLs Comments: Mod indep with ADLs     Hand Dominance        Extremity/Trunk Assessment   Upper Extremity Assessment Upper Extremity Assessment: Overall WFL for tasks assessed    Lower Extremity Assessment Lower Extremity Assessment: Overall WFL for tasks assessed (grossly 4/5 throughout; no focal weakness appreciated)       Communication   Communication: No difficulties  Cognition Arousal/Alertness: Awake/alert Behavior During Therapy: WFL for tasks assessed/performed Overall Cognitive Status: Within Functional Limits for tasks assessed                                          General Comments      Exercises Other Exercises Other Exercises: Toilet transfer, SPT with RW, cga/min assist; standing balance for lower body clothing managemetn, cga/min assist-does require UE support on RW to maintain balance with dynamic activities   Assessment/Plan    PT Assessment Patient needs continued PT services  PT Problem List Decreased strength;Decreased range of motion;Decreased activity tolerance;Decreased balance;Decreased mobility;Decreased coordination;Decreased knowledge of use of DME;Decreased safety awareness;Decreased knowledge of precautions;Cardiopulmonary status limiting activity       PT Treatment Interventions Gait training;DME instruction;Functional mobility training;Therapeutic activities;Patient/family education;Balance training;Therapeutic exercise    PT Goals  (Current goals can be found in the Care Plan section)  Acute Rehab PT Goals Patient Stated Goal: to return home with support from sister PT Goal Formulation: With patient Time For Goal Achievement: 06/17/22 Potential to Achieve Goals: Good    Frequency Min 2X/week     Co-evaluation               AM-PAC PT "6 Clicks" Mobility  Outcome Measure Help needed turning from your back to your side while in a flat bed without using bedrails?: None Help needed moving from lying on your back to sitting on the side of a flat bed without using bedrails?: None Help needed moving to and from a bed to a chair (including a wheelchair)?: A Little Help needed standing up from a chair using your arms (e.g., wheelchair or bedside chair)?: A Little Help needed to walk in hospital room?: A Little Help needed climbing 3-5 steps with a railing? : A Little 6 Click Score: 20    End of Session Equipment Utilized During Treatment: Gait belt Activity Tolerance: Patient tolerated treatment well Patient left: in bed;with call bell/phone within reach (chair position in bed; patient requesting bed alarm off, aware of need to call for assist with OOB activities.  RN informed/aware) Nurse Communication: Mobility status PT Visit Diagnosis: Difficulty in walking, not elsewhere classified (R26.2);Muscle weakness (generalized) (M62.81)    Time: [id number] PT Time Calculation (min) (ACUTE ONLY): 23 min   Charges:   PT Evaluation $PT Eval  Moderate Complexity: 1 Mod PT Treatments $Therapeutic Activity: 8-22 mins       Rafia Shedden H. Owens Shark, PT, DPT, NCS 06/03/22, 5:03 PM [phone]

## 2022-06-03 NOTE — Progress Notes (Signed)
Patient refusing bed alarm 

## 2022-06-03 NOTE — Progress Notes (Signed)
[facility]  ROUNDING NOTE   Subjective:     Objective:  Vital signs in last 24 hours:  Temp:  [97.8 F (36.6 C)-99 F (37.2 C)] 98.3 F (36.8 C) (02/24 0815) Pulse Rate:  [100-110] 102 (02/24 0815) Resp:  [20] 20 (02/24 0403) BP: (91-138)/(47-95) 130/95 (02/24 0815) SpO2:  [88 %-94 %] 94 % (02/24 0815) Weight:  [72.4 kg] 72.4 kg (02/23 2341)  Weight change: -2.761 kg Filed Weights   06/01/22 1557 06/02/22 2341  Weight: 75.2 kg 72.4 kg    Intake/Output: I/O last 3 completed shifts: In: 2267 [P.O.:240; I.V.:1927; IV Piggyback:100] Out: 403 [Urine:403]   Intake/Output this shift:  No intake/output data recorded.  Physical Exam: General: NAD, sitting up in bed  Head: Normocephalic, atraumatic. Moist oral mucosal membranes  Eyes: Anicteric, PERRL  Neck: Supple, trachea midline  Lungs:  Clear to auscultation  Heart: Regular rate and rhythm  Abdomen:  Soft, nontender,   Extremities:  no peripheral edema.  Neurologic: Nonfocal, moving all four extremities  Skin: No lesions        Basic Metabolic Panel: Recent Labs  Lab 05/29/22 1408 06/01/22 1158 06/02/22 0242 06/03/22 0535  NA 136 131* 132* 138  K 4.3 4.4 4.4 4.5  CL 105 101 106 116*  CO2 18* 12* 12* 11*  GLUCOSE 94 63* 62* 67*  BUN 25 81* 76* 66*  CREATININE 1.65* 4.59* 3.99* 2.84*  CALCIUM 9.4 8.5* 7.5* 8.4*  MG  --   --   --  2.1    Liver Function Tests: Recent Labs  Lab 05/29/22 1408 06/01/22 1158  AST 9* 45*  ALT 7 20  ALKPHOS  --  106  BILITOT 0.7 1.4*  PROT 6.8 6.2*  ALBUMIN  --  3.0*   Recent Labs  Lab 06/01/22 1158  LIPASE 27   No results for input(s): "AMMONIA" in the last 168 hours.  CBC: Recent Labs  Lab 05/29/22 1408 06/01/22 1158 06/02/22 0242 06/03/22 0535  WBC 20.6* 14.4* 10.0 8.4  NEUTROABS 18,355*  --   --   --   HGB 16.9* 13.2 11.4* 11.9*  HCT 48.9* 40.1 35.1* 36.4  MCV 89.2 92.6 93.1 93.6  PLT 256 220 195 201    Cardiac Enzymes: No results  for input(s): "CKTOTAL", "CKMB", "CKMBINDEX", "TROPONINI" in the last 168 hours.  BNP: Invalid input(s): "POCBNP"  CBG: Recent Labs  Lab 06/02/22 0557  GLUCAP 71    Microbiology: Results for orders placed or performed in visit on 05/29/22  Urine Culture     Status: Abnormal   Collection Time: 05/29/22  2:40 PM   Specimen: Urine  Result Value Ref Range Status   MICRO NUMBER: [id number]  Final   SPECIMEN QUALITY: Adequate  Final   Sample Source URINE, CLEAN CATCH  Final   STATUS: FINAL  Final   ISOLATE 1: Escherichia coli (A)  Final    Comment: Greater than 100,000 CFU/mL of Escherichia coli      Susceptibility   Escherichia coli - URINE CULTURE, REFLEX    AMOX/CLAVULANIC <=2 Sensitive     AMPICILLIN 4 Sensitive     AMPICILLIN/SULBACTAM <=2 Sensitive     CEFAZOLIN* <=4 Not Reportable      * For infections other than uncomplicated UTI caused by E. coli, K. pneumoniae or P. mirabilis: Cefazolin is resistant if MIC > or = 8 mcg/mL. (Distinguishing susceptible versus intermediate for isolates with MIC < or = 4 mcg/mL requires additional testing.) For uncomplicated UTI  caused by E. coli, K. pneumoniae or P. mirabilis: Cefazolin is susceptible if MIC <32 mcg/mL and predicts susceptible to the oral agents cefaclor, cefdinir, cefpodoxime, cefprozil, cefuroxime, cephalexin and loracarbef.     CEFTAZIDIME <=1 Sensitive     CEFEPIME <=1 Sensitive     CEFTRIAXONE <=1 Sensitive     CIPROFLOXACIN <=0.25 Sensitive     LEVOFLOXACIN 1 Intermediate     GENTAMICIN <=1 Sensitive     IMIPENEM <=0.25 Sensitive     NITROFURANTOIN <=16 Sensitive     PIP/TAZO <=4 Sensitive     TOBRAMYCIN <=1 Sensitive     TRIMETH/SULFA* <=20 Sensitive      * For infections other than uncomplicated UTI caused by E. coli, K. pneumoniae or P. mirabilis: Cefazolin is resistant if MIC > or = 8 mcg/mL. (Distinguishing susceptible versus intermediate for isolates with MIC < or = 4 mcg/mL requires additional  testing.) For uncomplicated UTI caused by E. coli, K. pneumoniae or P. mirabilis: Cefazolin is susceptible if MIC <32 mcg/mL and predicts susceptible to the oral agents cefaclor, cefdinir, cefpodoxime, cefprozil, cefuroxime, cephalexin and loracarbef. Legend: S = Susceptible  I = Intermediate R = Resistant  NS = Not susceptible * = Not tested  NR = Not reported **NN = See antimicrobic comments     Coagulation Studies: No results for input(s): "LABPROT", "INR" in the last 72 hours.  Urinalysis: Recent Labs    06/01/22 1542  COLORURINE AMBER*  LABSPEC 1.014  PHURINE 5.0  GLUCOSEU NEGATIVE  HGBUR MODERATE*  BILIRUBINUR NEGATIVE  KETONESUR NEGATIVE  PROTEINUR 100*  NITRITE POSITIVE*  LEUKOCYTESUR MODERATE*      Imaging: CT Renal Stone Study  Result Date: 06/01/2022 CLINICAL DATA:  LEFT-sided abdominal pain. EXAM: CT ABDOMEN AND PELVIS WITHOUT CONTRAST TECHNIQUE: Multidetector CT imaging of the abdomen and pelvis was performed following the standard protocol without IV contrast. RADIATION DOSE REDUCTION: This exam was performed according to the departmental dose-optimization program which includes automated exposure control, adjustment of the mA and/or kV according to patient size and/or use of iterative reconstruction technique. COMPARISON:  None Available. FINDINGS: Lower chest: Lung bases are clear. Hepatobiliary: No focal hepatic lesion. Normal gallbladder. No biliary duct dilatation. Common bile duct is normal. Pancreas: Pancreas is normal. No ductal dilatation. No pancreatic inflammation. Spleen: Normal spleen Adrenals/urinary tract: Adrenal glands normal. RIGHT kidney atrophic. Several calculi within the RIGHT hand LEFT kidney without obstruction. LEFT ureter normal. RIGHT ureter normal. No bladder calculi. Stomach/Bowel: Stomach, small bowel, appendix, and cecum are normal. The colon and rectosigmoid colon are normal. Vascular/Lymphatic: Abdominal aorta is normal caliber with  atherosclerotic calcification. There is no retroperitoneal or periportal lymphadenopathy. No pelvic lymphadenopathy. Reproductive: Post hysterectomy.  Adnexa unremarkable Other: No free fluid. Musculoskeletal: No aggressive osseous lesion. Posterior lumbar fusion. IMPRESSION: 1. No acute findings in the abdomen pelvis. 2. Bilateral nonobstructing renal calculi. 3. Atrophic RIGHT kidney. 4.  Aortic Atherosclerosis (ICD10-I70.0). Electronically Signed   By: Suzy Bouchard M.D.   On: 06/01/2022 13:07   DG Chest Portable 1 View  Result Date: 06/01/2022 CLINICAL DATA:  Weakness EXAM: PORTABLE CHEST 1 VIEW COMPARISON:  None Available. FINDINGS: No pleural effusion. No pneumothorax. Unchanged cardiac and mediastinal contours. No focal airspace opacity. No radiographically apparent displaced rib fractures. Visualized upper abdomen is unremarkable. IMPRESSION: No focal airspace opacity. Electronically Signed   By: Marin Roberts M.D.   On: 06/01/2022 12:46     Medications:    dextrose     sodium bicarbonate 150 mEq in  dextrose 5 % 1,150 mL infusion      ciprofloxacin  500 mg Oral Daily   heparin  5,000 Units Subcutaneous Q8H   midodrine  5 mg Oral TID WC   montelukast  10 mg Oral QHS   albuterol, azelastine, dextrose, diphenhydrAMINE, fluticasone, ondansetron **OR** ondansetron (ZOFRAN) IV, senna-docusate, traMADol  Assessment/ Plan:  Ms. April Price is a 67 y.o. white female with GERD, depression, fibromyalgia, chronic pain syndrome who was admitted to [facility] on 06/01/2022 for AKI (acute kidney injury) ([place]) [N17.9] Acute renal failure, unspecified acute renal failure type ([place]) [N17.9]   Acute kidney injury with chronic kidney disease IIIB. Baseline creatinine appears to be 1.52 with GFR 38 on 07/28/21. AKI appears to be multifactorial from antibiotic and NSAID use. Ct renal stone shows atrophic right kidney and no hydronephrosis. Agree with holding Bactrim and NSAIDs. Continue IVF for now and  patient encouraged to improve oral intake. No acute need for dialysis for now. Patient will need outpatient follow up with our office at discharge.    2. Anemia of chronic kidney disease Recent Labs       Lab Results  Component Value Date    HGB 11.4 (L) 06/02/2022     Hgb within desired range.    3. UTI: E. Coli on culture. Bactrim held due to kidney injury. Changed to ciprofloxacin.   4. Hypotension: continue midodrine.   5. Metabolic acidosis: secondary to acute kidney injury. Change IVF to sodium bicarbonate infusion.    LOS: 2 April Price 2/24/202411:16 AM

## 2022-06-03 NOTE — Progress Notes (Signed)
PROGRESS NOTE    April Price  [id number] DOB: October 03, 1955 DOA: 06/01/2022 PCP: Jearld Fenton, NP  [id number]  LOS: 2 days   Brief hospital course:   Assessment & Plan: April Price is a 67 year old female with history of hypertension, urinary retention, chronic pain syndrome, fibromyalgia, generalized weakness, who presented to emergency department for chief concerns of increasing weakness and left flank pain.    * AKI (acute kidney injury) ([place]) CKD 3b Metabolic acidosis  --Cr [id number] on presentation, was 1.65 three days prior. - Etiology workup in progress, with patient presentation of hypotension, differential diagnosis include intrarenal secondary to Bactrim versus prerenal secondary to poor p.o. intake --Ct renal stone shows Bilateral nonobstructing renal calculi, atrophic right kidney and no hydronephrosis.  --nephrology consulted --s/p IVF boluses x 3L f/b MIVF, with Cr improved to 2.84 this morning. Plan: --cont MIVF with Na bicarb '@50'$ , per nephro --hold Bactrim and NSAIDs.   UTI (urinary tract infection) with pyuria - urine cx from 2/19 PTA showed E coli with resistance only to levaquin, however, pt has several allergies.  Started on Bactrim PTA. --abx switched to Aztreonam on admission, then switched to Cipro.   Plan: --cont Cipro  Hypotension --likely 2/2 dehydration and opioids --started on midodrine 10 mg TID --BP improved today Plan: --taper off midodrine today --hold home BP med  Excessive sleepiness, resolved --likely due to flexeril and opioids in the setting of reduced kidney function and clearance Plan: --hold home flexeril --avoid increasing opioids above home tramadol  Chronic pain syndrome on opioids --cont home tramadol PRN --hold flexeril due to excess sleepiness  Worsening weakness --PT, cleared pt for discharge home.  Elevated troponin - trop 50's x2.  Likely supply demand mismatch.  Chronic fatigue syndrome - Per  patient this is worse than her baseline fatigue and weakness - Patient states this is generalized  Hypoglycemia - likely 2/2 poor oral intake while somnolent. --cont MIVF with D5'@50'$    DVT prophylaxis: Heparin SQ Code Status: Full code  Family Communication: sister updated at bedside today Level of care: Telemetry Medical Dispo:   The patient is from: home Anticipated d/c is to: home Anticipated d/c date is: 1-2 days, pending nephro clearance.   Subjective and Interval History:  Pt was much more alert this morning, started to have oral intake.  Complained of chronic back pain.  No dyspnea.   Objective: Vitals:   06/02/22 2341 06/03/22 0403 06/03/22 0815 06/03/22 1606  BP:  138/67 (!) 130/95 (!) 134/52  Pulse:  (!) 109 (!) 102 (!) 108  Resp:  20  20  Temp:  98.3 F (36.8 C) 98.3 F (36.8 C) 98 F (36.7 C)  TempSrc:  Oral Oral   SpO2:  93% 94% 91%  Weight: 72.4 kg     Height: '5\' 3"'$  (1.6 m)       Intake/Output Summary (Last 24 hours) at 06/03/2022 1809 Last data filed at 06/03/2022 0432 Gross per 24 hour  Intake 863.72 ml  Output 0 ml  Net 863.72 ml   Filed Weights   06/01/22 1557 06/02/22 2341  Weight: 75.2 kg 72.4 kg    Examination:   Constitutional: NAD, AAOx3 HEENT: conjunctivae and lids normal, EOMI CV: No cyanosis.   RESP: normal respiratory effort, on RA Neuro: II - XII grossly intact.   Psych: Normal mood and affect.  Appropriate judgement and reason   Data Reviewed: I have personally reviewed labs and imaging studies  Time spent: 50 minutes  Enzo Bi,  MD [facility] If 7PM-7AM, please contact night-coverage 06/03/2022, 6:09 PM

## 2022-06-04 DIAGNOSIS — N179 Acute kidney failure, unspecified: Secondary | ICD-10-CM | POA: Diagnosis not present

## 2022-06-04 LAB — BASIC METABOLIC PANEL
Anion gap: 6 (ref 5–15)
BUN: 56 mg/dL — ABNORMAL HIGH (ref 8–23)
CO2: 17 mmol/L — ABNORMAL LOW (ref 22–32)
Calcium: 8.6 mg/dL — ABNORMAL LOW (ref 8.9–10.3)
Chloride: 113 mmol/L — ABNORMAL HIGH (ref 98–111)
Creatinine, Ser: 2.18 mg/dL — ABNORMAL HIGH (ref 0.44–1.00)
GFR, Estimated: 24 mL/min — ABNORMAL LOW (ref >60)
Glucose, Bld: 87 mg/dL (ref 70–99)
Potassium: 3.6 mmol/L (ref 3.5–5.1)
Sodium: 136 mmol/L (ref 135–145)

## 2022-06-04 LAB — CBC
HCT: 36.5 % (ref 36.0–46.0)
Hemoglobin: 11.9 g/dL — ABNORMAL LOW (ref 12.0–15.0)
MCH: 30.1 pg (ref 26.0–34.0)
MCHC: 32.6 g/dL (ref 30.0–36.0)
MCV: 92.4 fL (ref 80.0–100.0)
Platelets: 200 10*3/uL (ref 150–400)
RBC: 3.95 MIL/uL (ref 3.87–5.11)
RDW: 14.4 % (ref 11.5–15.5)
WBC: 6.9 10*3/uL (ref 4.0–10.5)
nRBC: 0 % (ref 0.0–0.2)

## 2022-06-04 LAB — MAGNESIUM: Magnesium: 1.7 mg/dL (ref 1.7–2.4)

## 2022-06-04 MED ORDER — SENNOSIDES-DOCUSATE SODIUM 8.6-50 MG PO TABS: 1.0000 | ORAL_TABLET | Freq: Every day | ORAL | 0 refills | Status: DC

## 2022-06-04 MED ORDER — CYCLOBENZAPRINE HCL 5 MG PO TABS: 5.0000 mg | ORAL_TABLET | Freq: Two times a day (BID) | ORAL | 0 refills | Status: AC | PRN

## 2022-06-04 MED ORDER — CIPROFLOXACIN HCL 500 MG PO TABS: 500.0000 mg | ORAL_TABLET | Freq: Every day | ORAL | 0 refills | Status: AC

## 2022-06-04 NOTE — TOC Initial Note (Addendum)
Transition of Care [facility] - Initial/Assessment Note    Patient Details  Name: April Price MRN: [id number] Date of Birth: [birth year]  Transition of Care [facility]) CM/SW Contact:    Izola Price, RN Phone Number: 06/04/2022, 10:45 AM  Clinical Narrative: 2/24: Damaris Schooner with patient as discharging today. Patient lives alone but her sister is here from [place] for a week to help out. PCP: Webb Silversmith. RX: [facility] all on chart. Has a ramp into home installed last month and uses/prefers 4WRW she has at home. Requested a BSC and a sliding tub bench, which was ordered via [place] at [employer].  Usually drives self but has been unable to to so alone as she cannot handle getting in and out of care with walker needed. Has groceries delivered from [place]. Has handicap toilet with grab bars in bathroom 8 feet from bedroom. PT recommended and HH PT ordered. [facility] accepted per [place] Drafts. Sister will transport to home on discharge and has a neighbor they can ask for assistance if need to get into home with ramp present. Information on [facility] and transportation resources placed in AVS and Unit RN asked to reprint so it is included. Medications given to get patient through today as pharmacy closed today.   Simmie Davies RN CM                Per request and verbal ok from patient, RN CM texted discharge plan instructions for [facility], [facility] and transportation via [place]. Simmie Davies RN CM   [facility] to deliver DME to home Monday per [place] at [place]. Simmie Davies RN CM    Barriers to Discharge: Barriers Resolved   Patient Goals and CMS Choice     Choice offered to / list presented to : Patient (No HH agency prior use or preference.)      Expected Discharge Plan and Services         Expected Discharge Date: 06/04/22               DME Arranged: 3-N-1, Tub bench DME Agency: [facility] Date DME Agency Contacted: 06/04/22 Time DME Agency Contacted: [id number] Representative spoke with at DME Agency:  [place]: PT [place]: [place] ([place]) Date Central Contacted: 06/04/22 Time HH Agency Contacted: [id number]    Prior Living Arrangements/Services                       Activities of Daily Living Home Assistive Devices/Equipment: Eyeglasses ADL Screening (condition at time of admission) Patient's cognitive ability adequate to safely complete daily activities?: Yes Is the patient deaf or have difficulty hearing?: No Does the patient have difficulty seeing, even when wearing glasses/contacts?: No Does the patient have difficulty concentrating, remembering, or making decisions?: No Patient able to express need for assistance with ADLs?: Yes Does the patient have difficulty dressing or bathing?: No Independently performs ADLs?: Yes (appropriate for developmental age) Does the patient have difficulty walking or climbing stairs?: Yes Weakness of Legs: Both Weakness of Arms/Hands: None  Permission Sought/Granted                  Emotional Assessment              Admission diagnosis:  AKI (acute kidney injury) ([place]) [N17.9] Acute renal failure, unspecified acute renal failure type [facility]) [N17.9] Patient Active Problem List   Diagnosis Date Noted   AKI (acute kidney injury) ([place]) 06/01/2022   Stage 3b chronic kidney  disease (CKD) ([place]) 06/01/2022   Elevated troponin 06/01/2022   UTI (urinary tract infection) with pyuria 06/01/2022   Polycythemia 01/27/2021   Statin intolerance 01/27/2021   Overweight with body mass index (BMI) of 26 to 26.9 in adult 01/27/2021   Hyperlipidemia 11/21/2019   GERD (gastroesophageal reflux disease) 11/21/2019   Chronic lumbar radicular pain (Left) 04/18/2016   Chronic pain syndrome 02/10/2016   Opiate use (80-140 MME/Day) 02/10/2016   Chronic shoulder pain (Location of Primary Source of Pain) (Bilateral) (L>R) 02/10/2016   Chronic hip pain (Location of Secondary source of pain) (Bilateral) (L>R) 02/10/2016    Chronic sacroiliac joint pain (Location of Tertiary source of pain) (Bilateral) (L>R) 02/10/2016   Chronic low back pain (Bilateral) (L>R) 02/10/2016   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 [place] [address] Phone: [phone] Fax: [phone]  [place], [place] [address] Phone: [phone] Fax: [phone]     Social Determinants of Health (SDOH) Social History: SDOH Screenings   Food Insecurity: No Food Insecurity (06/01/2022)  Housing: Low Risk  (06/01/2022)  Transportation Needs: No Transportation Needs (06/01/2022)  Utilities: Not At Risk (06/01/2022)  Alcohol Screen: Low Risk  (07/28/2021)  Depression (PHQ2-9): Low Risk  (03/29/2022)  Financial Resource Strain: Medium Risk (03/24/2021)  Physical Activity: Insufficiently Active (03/24/2021)  Social Connections: Unknown (06/13/2021)  Stress: No Stress Concern Present (06/13/2021)  Tobacco Use: High Risk (06/01/2022)   SDOH Interventions:     Readmission Risk Interventions     No data to display

## 2022-06-04 NOTE — Progress Notes (Signed)
Patient discharge education completed including medications, follow up appointments, and s/s of concern. IV removed without complications. Patient wheeled to the main exit via wheel chair accompanied by staff and assisted into private vehicle. Discharged to the care of family in stable condition.

## 2022-06-04 NOTE — Progress Notes (Cosign Needed)
Patient is not able to walk the distance required to go the bathroom, or he/she is unable to safely negotiate stairs required to access the bathroom.  A BSC will alleviate this problem and help prevent falls trying to get to bathroom during the night with medical conditions.

## 2022-06-04 NOTE — Progress Notes (Signed)
[facility]  ROUNDING NOTE   Subjective:   Patient sitting up in bed Sister at bedside Currently eating apple slices Remains on room air No lower extremity edema  Creatinine 2.18 (2.84) Adequate urine output occurrences charted.   Objective:  Vital signs in last 24 hours:  Temp:  [97.7 F (36.5 C)-98.1 F (36.7 C)] 97.7 F (36.5 C) (02/25 0739) Pulse Rate:  [100-108] 100 (02/25 0739) Resp:  [19-20] 20 (02/25 0739) BP: (107-151)/(52-73) 107/57 (02/25 0739) SpO2:  [91 %-92 %] 92 % (02/25 0739)  Weight change:  Filed Weights   06/01/22 1557 06/02/22 2341  Weight: 75.2 kg 72.4 kg    Intake/Output: I/O last 3 completed shifts: In: 1624.6 [P.O.:120; I.V.:1504.6] Out: 0    Intake/Output this shift:  Total I/O In: 240 [P.O.:240] Out: -   Physical Exam: General: NAD, sitting up in bed  Head: Normocephalic, atraumatic. Moist oral mucosal membranes  Eyes: Anicteric  Lungs:  Clear to auscultation, normal effort  Heart: Regular rate and rhythm  Abdomen:  Soft, nontender  Extremities:  no peripheral edema.  Neurologic: Nonfocal, moving all four extremities  Skin: No lesions        Basic Metabolic Panel: Recent Labs  Lab 05/29/22 1408 06/01/22 1158 06/02/22 0242 06/03/22 0535 06/04/22 0441  NA 136 131* 132* 138 136  K 4.3 4.4 4.4 4.5 3.6  CL 105 101 106 116* 113*  CO2 18* 12* 12* 11* 17*  GLUCOSE 94 63* 62* 67* 87  BUN 25 81* 76* 66* 56*  CREATININE 1.65* 4.59* 3.99* 2.84* 2.18*  CALCIUM 9.4 8.5* 7.5* 8.4* 8.6*  MG  --   --   --  2.1 1.7     Liver Function Tests: Recent Labs  Lab 05/29/22 1408 06/01/22 1158  AST 9* 45*  ALT 7 20  ALKPHOS  --  106  BILITOT 0.7 1.4*  PROT 6.8 6.2*  ALBUMIN  --  3.0*    Recent Labs  Lab 06/01/22 1158  LIPASE 27    No results for input(s): "AMMONIA" in the last 168 hours.  CBC: Recent Labs  Lab 05/29/22 1408 06/01/22 1158 06/02/22 0242 06/03/22 0535 06/04/22 0441  WBC 20.6* 14.4* 10.0  8.4 6.9  NEUTROABS 18,355*  --   --   --   --   HGB 16.9* 13.2 11.4* 11.9* 11.9*  HCT 48.9* 40.1 35.1* 36.4 36.5  MCV 89.2 92.6 93.1 93.6 92.4  PLT 256 220 195 201 200     Cardiac Enzymes: No results for input(s): "CKTOTAL", "CKMB", "CKMBINDEX", "TROPONINI" in the last 168 hours.  BNP: Invalid input(s): "POCBNP"  CBG: Recent Labs  Lab 06/02/22 0557  GLUCAP 71     Microbiology: Results for orders placed or performed in visit on 05/29/22  Urine Culture     Status: Abnormal   Collection Time: 05/29/22  2:40 PM   Specimen: Urine  Result Value Ref Range Status   MICRO NUMBER: [id number]  Final   SPECIMEN QUALITY: Adequate  Final   Sample Source URINE, CLEAN CATCH  Final   STATUS: FINAL  Final   ISOLATE 1: Escherichia coli (A)  Final    Comment: Greater than 100,000 CFU/mL of Escherichia coli      Susceptibility   Escherichia coli - URINE CULTURE, REFLEX    AMOX/CLAVULANIC <=2 Sensitive     AMPICILLIN 4 Sensitive     AMPICILLIN/SULBACTAM <=2 Sensitive     CEFAZOLIN* <=4 Not Reportable      * For  infections other than uncomplicated UTI caused by E. coli, K. pneumoniae or P. mirabilis: Cefazolin is resistant if MIC > or = 8 mcg/mL. (Distinguishing susceptible versus intermediate for isolates with MIC < or = 4 mcg/mL requires additional testing.) For uncomplicated UTI caused by E. coli, K. pneumoniae or P. mirabilis: Cefazolin is susceptible if MIC <32 mcg/mL and predicts susceptible to the oral agents cefaclor, cefdinir, cefpodoxime, cefprozil, cefuroxime, cephalexin and loracarbef.     CEFTAZIDIME <=1 Sensitive     CEFEPIME <=1 Sensitive     CEFTRIAXONE <=1 Sensitive     CIPROFLOXACIN <=0.25 Sensitive     LEVOFLOXACIN 1 Intermediate     GENTAMICIN <=1 Sensitive     IMIPENEM <=0.25 Sensitive     NITROFURANTOIN <=16 Sensitive     PIP/TAZO <=4 Sensitive     TOBRAMYCIN <=1 Sensitive     TRIMETH/SULFA* <=20 Sensitive      * For infections other than uncomplicated  UTI caused by E. coli, K. pneumoniae or P. mirabilis: Cefazolin is resistant if MIC > or = 8 mcg/mL. (Distinguishing susceptible versus intermediate for isolates with MIC < or = 4 mcg/mL requires additional testing.) For uncomplicated UTI caused by E. coli, K. pneumoniae or P. mirabilis: Cefazolin is susceptible if MIC <32 mcg/mL and predicts susceptible to the oral agents cefaclor, cefdinir, cefpodoxime, cefprozil, cefuroxime, cephalexin and loracarbef. Legend: S = Susceptible  I = Intermediate R = Resistant  NS = Not susceptible * = Not tested  NR = Not reported **NN = See antimicrobic comments     Coagulation Studies: No results for input(s): "LABPROT", "INR" in the last 72 hours.  Urinalysis: Recent Labs    06/01/22 1542  COLORURINE AMBER*  LABSPEC 1.014  PHURINE 5.0  GLUCOSEU NEGATIVE  HGBUR MODERATE*  BILIRUBINUR NEGATIVE  KETONESUR NEGATIVE  PROTEINUR 100*  NITRITE POSITIVE*  LEUKOCYTESUR MODERATE*       Imaging: No results found.   Medications:    dextrose     sodium bicarbonate 150 mEq in dextrose 5 % 1,150 mL infusion 50 mL/hr at 06/04/22 [id number]    ciprofloxacin  500 mg Oral Daily   heparin  5,000 Units Subcutaneous Q8H   montelukast  10 mg Oral QHS   albuterol, azelastine, dextrose, diphenhydrAMINE, fluticasone, ondansetron **OR** ondansetron (ZOFRAN) IV, senna-docusate, traMADol  Assessment/ Plan:  Ms. April Price is a 67 y.o. white female with GERD, depression, fibromyalgia, chronic pain syndrome who was admitted to [facility] on 06/01/2022 for AKI (acute kidney injury) ([place]) [N17.9] Acute renal failure, unspecified acute renal failure type ([place]) [N17.9]   Acute kidney injury with chronic kidney disease IIIB. Baseline creatinine appears to be 1.52 with GFR 38 on 07/28/21. AKI appears to be multifactorial from antibiotic and NSAID use. Ct renal stone shows atrophic right kidney and no hydronephrosis. Agree with holding Bactrim and NSAIDs.   Renal function continues to improve. Patient cleared to discharge from renal stance with outpatient follow up in our office for labs and established care.    2. Anemia of chronic kidney disease Hemoglobin & Hematocrit     Component Value Date/Time   HGB 11.9 (L) 06/04/2022 0441   HCT 36.5 06/04/2022 0441   Hemoglobin acceptable. Will continue to monitor.    3. UTI: E. Coli on culture. Bactrim held due to kidney injury. Remains on ciprofloxacin.   4. Hypotension: continue midodrine.   5. Metabolic acidosis: secondary to acute kidney injury. Corrected with IVF.    LOS: 3 [place] 2/25/202411:05 AM

## 2022-06-04 NOTE — Plan of Care (Signed)
ADEQUATE FOR DISCHARGE

## 2022-06-04 NOTE — Discharge Instructions (Addendum)
Transportation Resources:   ([place]) [place] utilizes standard passenger vans and vans equipped with wheelchair lifts to assist persons with specialized transportation needs. To request transportation service, call the [place] office at [phone] between the hours of 8:00 a.m. and 5:00 p.m., Monday-Friday. Ride request must be made by 11:00a.m. at least one working day before you need transportation. Ride request are not taken during the weekend; therefore, request for transportation on Mondays must be made by 11:00a.m. the preceding Friday. Cecilie Lowers are waived (no charge), through April 09, 2021. Regular schedules are not operated on the following holidays: [place] Day, Good Friday, Memorial Day, New Year's Day, Fourth of July, Labor Day, Thanksgiving Day, Day after Thanksgiving, Christmas Day and the day before or day after Christmas.  Questions about [employer] and Services:  Who Can Ride? Anyone requiring transportation in [place] is eligible to ride the [employer]. [facility] provides transportation for general purpose trips, medical trips, and almost any non-emergency trip destination. In addition, special programs and pricing are available to qualified riders based on eligibility requirements.  How Do I Schedule A Ride? To request transportation service, call the [place] office at [phone] between the hours of 8:00 a.m. and 5:00 p.m., Monday-Friday. Rides are scheduled on a "first come, first serve" basis and must be made no later than 11:00am the working day before a trip and based on bus availability. Ride requests are not taken during the weekend. Requests for transportation on Mondays must be made at the latest by 11:00am the preceding Friday. Once capacity is reached, additional requests for trips will be deferred to later date options. What Information is Needed?  You will need to provide the following information when you call:  Your  name Your address Your telephone number The appointment date The appointment time Your destination address Your return time Special requirements (i.e., child seat, wheelchair) The name and relationship of anyone traveling with you When Can I Ride? Monday-Friday, 5:00 AM - 5:30 PM (no trip request is started after 5:00 PM). Riders should be ready to be picked up based on the "Trip Times" below.  Trip Times  All riders can expect a ride time of no more than 60 minutes aboard; generally the ride time will be less than 60 minutes. Buses are not permitted to wait on riders over 3 minutes once the bus arrives for pick-up. The driver will blow the horn upon arrival and (if safe) knock on your door shortly after if you do not respond to the horn. If no response, you will be marked "no show" and the bus depart. Three no shows in 60-days will result in riding privileges suspension. Going to Work, [place] going to work, doctor, or dialysis appointments may be dropped off up to 30 minutes ahead of their scheduled drop-off times. Pick-up time may therefore be up to 90 minutes before appointments (30 minutes early drop off + 60 max. ride time). This is the absolute maximum time with shorter intervals more often being the case. General trips(Shopping, etc.) - You will be picked-up generally at your scheduled pick-up time. However, [facility] may pick you up between 30 minutes before or 30 minutes after the scheduled pick-up. This flexibility is needed to accommodate other riders during the day. For your return trip home, [facility] will either pick-up at your scheduled time or no later than 30 minutes afterwards. Return trips from Work- You will be picked-up at your scheduled pick-up time but no  later than 30 minutes afterwards. You will not be picked-up any earlier than your scheduled return pick-up time. Return trips that are "Will Call"(Medical Trips) - You will be picked up as soon as a bus becomes  available to pick you up. While [facility] would like for this pick-up to be as quick as possible after receiving your call, it could take up to 60 minutes once you contact [facility] for your vehicle to arrive. [place], [place], [place] will be placed on daily routes with close to the same pick-up and drop-off times as can be arranged. Riders should be ready early in the event buses are running ahead of schedule. What is Dial-A-Ride? Dial-A-Ride is a transportation service for people 22 years of age or older. Call the [place] at [phone] for information on eligibility, fare assistance, rides provided on a donation basis and other support made possible through federal and state grants.  How Do I Cancel My Ride? If you must cancel a scheduled trip, please notify the [place] office at [phone]. Any cancellations should be made by 11:00 am the business day before your ride. Otherwise, it will be considered a no-show. If you receive 3 no-shows within a 60-day period, you may be suspended from services for 30 days.  How Much Does it Cost?** Within [place]:  One-Way - [id number]                              **Effective 07/02/18-Rider fares are waived(no charge), through the end of October 08, 2022.                            Round Trip - $10.00                                                                     [employer] General Public:                             One-Way - $5.00                            Round Trip - $10.00  Out of county non-emergency medical trips can be arranged by calling the [employer]. Payment for transportation service must be made to the driver.  [facility] accepts cash, checks, or money orders in the exact amount of the fare. The driver can issue a receipt for the cash amount collected. Required attendant will not be charged. All others will be charged regular fares.Packets of ten (10) tickets may be purchased in advance by contacting the [place] office  and are strongly encouraged.  All prices are subject to change without notice.  Agency Name: [facility] Address: [address] [place], [place] [address] Phone: [phone] Email: [email or web address] Website: [email or web address] Service(s) Offered: [employer], self-sufficiency, congregate meal  program, weatherization program, Administrator, sports program, emergency food assistance,  housing counseling, home ownership program, [place] program. August 03, 2016 22  Agency Name: [place] [phone]) Address: [place], [place], [place] [address] Phone:  [phone] Email:  Website: [email or web address] Service(s) Offered: Transportation for [employer], subscription and demand  response; Dial-a-Ride for citizens 47 years of age or older. Agency Name: Department of Social Services Address: [address] [place], [place] [address] Phone: [phone] Service(s) Offered: Child support services; child welfare services; food stamps;  Medicaid; work first family assistance; and aid with fuel,  rent, food and medicine, transportation assistance.  Agency Name: Disabled [employer] ([place]) Transportation  Network Address: Phone: [phone] Service(s) Offered: Transports veterans to the [facility] medical center. Call  forty-eight hours in advance and leave the name, telephone  number, date, and time of appointment. Veteran will be  contacted by the driver the day before the appointment to  arrange a pick up point

## 2022-06-04 NOTE — Discharge Summary (Signed)
Physician Discharge Summary   Patient: April Price MRN: [id number] DOB: [birth year]  Admit date:     06/01/2022  Discharge date: 06/04/22  Discharge Physician: Fritzi Mandes   PCP: Jearld Fenton, NP   Recommendations at discharge:   patient follows with nephrology Dr. Juleen China in one week. Labs need to be checked for renal function follow-up PCP in 1 to 2 weeks.  Discharge Diagnoses: Principal Problem:   AKI (acute kidney injury) ([facility] [facility]) Active Problems:   Chronic pain syndrome   Chronic hip pain (Location of Secondary source of pain) (Bilateral) (L>R)   Fibromyalgia   Depression   Hypoglycemia   Chronic fatigue syndrome   GERD (gastroesophageal reflux disease)   Stage 3b chronic kidney disease (CKD) (HCC)   Elevated troponin   UTI (urinary tract infection) with pyuria  Assessment & Plan: April Price is a 67 year old female with history of hypertension, urinary retention, chronic pain syndrome, fibromyalgia, generalized weakness, who presented to emergency department for chief concerns of increasing weakness and left flank pain.    AKI (acute kidney injury) ([place]) CKD 3b Metabolic acidosis  --Cr [id number] on presentation, was 1.65 three days prior. - Etiology workup in progress, with patient presentation of hypotension, differential diagnosis include intrarenal secondary to Bactrim versus prerenal secondary to poor p.o. intake --Ct renal stone shows Bilateral nonobstructing renal calculi, atrophic right kidney and no hydronephrosis.  --nephrology consulted --s/p IVF boluses x 3L f/b MIVF, with Cr improved to 2.14 (4.59)   UTI (urinary tract infection) with pyuria - urine cx from 2/19 PTA showed E coli with resistance only to levaquin, however, pt has several allergies.  Started on Bactrim PTA. --abx switched to Cipro for 7 days   Hypotension --likely 2/2 dehydration and opioids --BP improved today --hold home BP med at discharge for now. Defer to PCP/nephrology to  resume FBP remains stable   Excessive sleepiness, resolved --likely due to flexeril and opioids in the setting of reduced kidney function and clearance -- PRN Flexeril   Chronic pain syndrome on opioids --cont home tramadol PRN   Worsening weakness --PT, cleared pt for discharge home. -- Home health PT and equipment ordered   Elevated troponin - trop 50's x2.  Likely supply demand mismatch.   Chronic fatigue syndrome - Per patient this is worse than her baseline fatigue and weakness - Patient states this is generalized   Hypoglycemia sugars stable  Overall improving. Discharge plan discussed with patient's Sr. at bedside. Voice understanding. Home health PT and equipment arrange  DVT prophylaxis: Heparin SQ Code Status: Full code  Family Communication: sister updated at bedside today Level of care: Telemetry Medical Dispo:   The patient is from: home Anticipated d/c is to: home with [facility]    Pain control - [place] was reviewed. and patient was instructed, not to drive, operate heavy machinery, perform activities at heights, swimming or participation in water activities or provide baby-sitting services while on Pain, Sleep and Anxiety Medications; until their outpatient Physician has advised to do so again. Also recommended to not to take more than prescribed Pain, Sleep and Anxiety Medications.  Consultants: nephrology Disposition: Home Diet recommendation:  Discharge Diet Orders (From admission, onward)     Start     Ordered   06/04/22 0000  Diet - low sodium heart healthy        06/04/22 0953           Cardiac diet DISCHARGE MEDICATION: Allergies as of  06/04/2022       Reactions   Keflex [cephalexin] Anaphylaxis   Unspecified reaction per patient chart- she was started on Bactrim instead after 1-2 doses of Keflex  Patient says she had anaphylactic rxn   Penicillins Anaphylaxis   Has patient had a PCN  reaction causing immediate rash, facial/tongue/throat swelling, SOB or lightheadedness with hypotension: Yes Has patient had a PCN reaction causing severe rash involving mucus membranes or skin necrosis: No Has patient had a PCN reaction that required hospitalization No Has patient had a PCN reaction occurring within the last 10 years: Yes If all of the above answers are "NO", then may proceed with Cephalosporin use.   Tylenol [acetaminophen] Anaphylaxis, Itching, Hives   Ears and throat itch    Armoracia Rusticana Ext (horseradish)    Morphine And Related Other (See Comments)   Depression    Nickel Itching   Pepcid [famotidine] Nausea And Vomiting   Shrimp [shellfish Allergy] Hives   Tape    irritation   Tetanus Toxoids Other (See Comments)   Swelling at injection site and fever   Wheat Bran    Gabapentin Other (See Comments)   Makes her feel "spacy" off balance, like she is coming out of anesthesia.         Medication List     STOP taking these medications    lisinopril 5 MG tablet Commonly known as: ZESTRIL   sulfamethoxazole-trimethoprim 800-160 MG tablet Commonly known as: BACTRIM DS   tamsulosin 0.4 MG Caps capsule Commonly known as: FLOMAX   tiZANidine 4 MG tablet Commonly known as: ZANAFLEX       TAKE these medications    albuterol 108 (90 Base) MCG/ACT inhaler Commonly known as: VENTOLIN HFA Inhale 2 puffs into the lungs every 4 (four) hours as needed for wheezing or shortness of breath.   azelastine 0.1 % nasal spray Commonly known as: ASTELIN Place 2 sprays into both nostrils 2 (two) times daily as needed for rhinitis. Use in each nostril as directed   ciprofloxacin 500 MG tablet Commonly known as: CIPRO Take 1 tablet (500 mg total) by mouth daily for 4 days. Start taking on: June 05, 2022   cyclobenzaprine 5 MG tablet Commonly known as: FLEXERIL Take 5 mg by mouth 2 (two) times daily.   EPINEPHrine 0.3 mg/0.3 mL Soaj injection Commonly  known as: EpiPen 2-Pak Inject 0.3 mLs (0.3 mg total) into the muscle once for 1 dose.   esomeprazole 40 MG capsule Commonly known as: NEXIUM TAKE 1 CAPSULE BY MOUTH EVERY DAY   fluticasone 50 MCG/ACT nasal spray Commonly known as: FLONASE Place 2 sprays into both nostrils daily.   montelukast 10 MG tablet Commonly known as: SINGULAIR Take 10 mg by mouth at bedtime.   PRESERVISION AREDS 2 PO Take 1 capsule by mouth 2 (two) times daily.   senna-docusate 8.6-50 MG tablet Commonly known as: Senokot-S Take 1 tablet by mouth at bedtime.   traMADol 50 MG tablet Commonly known as: ULTRAM Take 50 mg by mouth every 8 (eight) hours as needed.        Follow-up Information     Jearld Fenton, NP. Schedule an appointment as soon as possible for a visit in 1 week(s).   Specialties: Internal Medicine, Emergency Medicine Why: hospital f/u and labs [facility]) Contact information: [place] [address] [phone]         Lavonia Dana, MD. Schedule an appointment as soon as possible for a visit in  1 week(s).   Specialty: Nephrology Why: f/u AKI on CKD 3b pt will need f/u labs Contact information: [place] [address] [phone]                 Filed Weights   06/01/22 1557 06/02/22 2341  Weight: 75.2 kg 72.4 kg     Condition at discharge: fair  The results of significant diagnostics from this hospitalization (including imaging, microbiology, ancillary and laboratory) are listed below for reference.   Imaging Studies: CT Renal Stone Study  Result Date: 06/01/2022 CLINICAL DATA:  LEFT-sided abdominal pain. EXAM: CT ABDOMEN AND PELVIS WITHOUT CONTRAST TECHNIQUE: Multidetector CT imaging of the abdomen and pelvis was performed following the standard protocol without IV contrast. RADIATION DOSE REDUCTION: This exam was performed according to the departmental dose-optimization program which includes automated exposure  control, adjustment of the mA and/or kV according to patient size and/or use of iterative reconstruction technique. COMPARISON:  None Available. FINDINGS: Lower chest: Lung bases are clear. Hepatobiliary: No focal hepatic lesion. Normal gallbladder. No biliary duct dilatation. Common bile duct is normal. Pancreas: Pancreas is normal. No ductal dilatation. No pancreatic inflammation. Spleen: Normal spleen Adrenals/urinary tract: Adrenal glands normal. RIGHT kidney atrophic. Several calculi within the RIGHT hand LEFT kidney without obstruction. LEFT ureter normal. RIGHT ureter normal. No bladder calculi. Stomach/Bowel: Stomach, small bowel, appendix, and cecum are normal. The colon and rectosigmoid colon are normal. Vascular/Lymphatic: Abdominal aorta is normal caliber with atherosclerotic calcification. There is no retroperitoneal or periportal lymphadenopathy. No pelvic lymphadenopathy. Reproductive: Post hysterectomy.  Adnexa unremarkable Other: No free fluid. Musculoskeletal: No aggressive osseous lesion. Posterior lumbar fusion. IMPRESSION: 1. No acute findings in the abdomen pelvis. 2. Bilateral nonobstructing renal calculi. 3. Atrophic RIGHT kidney. 4.  Aortic Atherosclerosis (ICD10-I70.0). Electronically Signed   By: Suzy Bouchard M.D.   On: 06/01/2022 13:07   DG Chest Portable 1 View  Result Date: 06/01/2022 CLINICAL DATA:  Weakness EXAM: PORTABLE CHEST 1 VIEW COMPARISON:  None Available. FINDINGS: No pleural effusion. No pneumothorax. Unchanged cardiac and mediastinal contours. No focal airspace opacity. No radiographically apparent displaced rib fractures. Visualized upper abdomen is unremarkable. IMPRESSION: No focal airspace opacity. Electronically Signed   By: Marin Roberts M.D.   On: 06/01/2022 12:46   DG Abd 1 View  Result Date: 05/29/2022 CLINICAL DATA:  Left lower quadrant abdominal pain and back pain. Stone suspected. EXAM: ABDOMEN - 1 VIEW COMPARISON:  Lumbar radiography 05/13/2022  FINDINGS: Gas pattern is normal without evidence of ileus, obstruction or abnormal amount of fecal matter. Multiple small stones are present in both kidneys, 2-4 mm in size. Multiple phleboliths are present in the pelvis. I do not see a stone that I can not conclusively identify is being present within either ureter. There is chronic scoliosis and degenerative change of the spine with distant L5-S1 fusion. IMPRESSION: 1. Multiple small bilateral renal calculi. No definite ureteral stone seen, with comparison to prior lumbar radiographs. Electronically Signed   By: Nelson Chimes M.D.   On: 05/29/2022 14:50   DG Chest 2 View  Result Date: 05/29/2022 CLINICAL DATA:  Shortness of breath. Left flank pain. COPD. Smoker. EXAM: CHEST - 2 VIEW COMPARISON:  None Available. FINDINGS: Hyperinflation. Midline trachea. Normal heart size. Atherosclerosis in the transverse aorta. Right cardiophrenic angle blunting on the frontal is likely due to a prominent epicardial fat pad. No pleural effusion or pneumothorax. Minimal right lower lobe scarring laterally. IMPRESSION: Hyperinflation, consistent with COPD. No acute  superimposed process. Aortic Atherosclerosis (ICD10-I70.0). Electronically Signed   By: Abigail Miyamoto M.D.   On: 05/29/2022 14:48   MR Lumbar Spine W Wo Contrast  Result Date: 05/15/2022 CLINICAL DATA:  History of lumbosacral spine surgery. Back pain and bilateral leg pain. EXAM: MRI LUMBAR SPINE WITHOUT AND WITH CONTRAST TECHNIQUE: Multiplanar and multiecho pulse sequences of the lumbar spine were obtained without and with intravenous contrast. CONTRAST:  7.48m GADAVIST GADOBUTROL 1 MMOL/ML IV SOLN COMPARISON:  MRI of the lumbar spine January 07, 2013. FINDINGS: Segmentation:  Standard. Alignment: Levoconvex scoliosis. Trace retrolisthesis of L2 over L3 and L4 over L5. Trace anterolisthesis of L5 over S1. Vertebrae: No fracture, evidence of discitis, or bone lesion. Postsurgical changes from posterior spinal  fusion at L5-S1. Endplate degenerative changes throughout the lumbar spine. Conus medullaris and cauda equina: Conus extends to the L1 level. Conus appear normal. On postcontrast images, enhancement of the roots of the cauda equina is seen below the L3-4 level. Paraspinal and other soft tissues: Right renal atrophy, new since prior MRI. Disc levels: T10-11: Seen only on sagittal views. Central disc protrusion causing indentation of the thecal sac. No significant spinal canal or neural foraminal stenosis. T11-12: No spinal canal or neural foraminal stenosis. T12-L1: Mild facet degenerative changes. No spinal canal or neural foraminal stenosis. L1-2: Loss of disc height, right asymmetric disc bulge and mild facet degenerative changes without significant spinal canal or neural foraminal stenosis. L2-3: Loss of disc height, disc bulge with superimposed right subarticular to foraminal disc protrusion with associated osteophytic component and mild facet degenerative changes. Findings result in prominent narrowing of the right subarticular zone and severe right neural foraminal narrowing. L3-4: Loss of disc height, disc bulge with associated osteophytic component and moderate to advanced facet degenerative changes with ligamentum flavum redundancy resulting in severe spinal canal stenosis, severe right and moderate to severe left neural foraminal narrowing. L4-5: Loss of disc height, disc bulge with superimposed left central to far lateral disc protrusion with associated osteophytic component, mild right and advanced left facet degenerative changes and ligamentum flavum redundancy. Findings result in moderate spinal canal stenosis with mild right and severe left subarticular zone stenosis, mild right and severe left neural foraminal narrowing. L5-S1: Disc bulge/disc uncovering, mild right and advanced left facet degenerative changes resulting in moderate left neural foraminal narrowing. No significant spinal canal  stenosis. Degenerative changes have significantly progressed since prior MRI. IMPRESSION: 1. Severe spinal canal stenosis at L3-4 with severe right and moderate to severe left neural foraminal narrowing at this level. 2. Prominent narrowing of the right subarticular zone and severe right neural foraminal narrowing at L2-3. 3. Moderate spinal canal stenosis with severe left subarticular zone stenosis and severe left neural foraminal narrowing at L4-5. 4. Moderate left neural foraminal narrowing at L5-S1. 5. Enhancement of the roots of the cauda equina below the level of severe stenosis suggesting neuritis. Electronically Signed   By: KPedro EarlsM.D.   On: 05/15/2022 10:28    Microbiology: Results for orders placed or performed in visit on 05/29/22  Urine Culture     Status: Abnormal   Collection Time: 05/29/22  2:40 PM   Specimen: Urine  Result Value Ref Range Status   MICRO NUMBER: [id number] Final   SPECIMEN QUALITY: Adequate  Final   Sample Source URINE, CLEAN CATCH  Final   STATUS: FINAL  Final   ISOLATE 1: Escherichia coli (A)  Final    Comment: Greater than 100,000 CFU/mL of  Escherichia coli      Susceptibility   Escherichia coli - URINE CULTURE, REFLEX    AMOX/CLAVULANIC <=2 Sensitive     AMPICILLIN 4 Sensitive     AMPICILLIN/SULBACTAM <=2 Sensitive     CEFAZOLIN* <=4 Not Reportable      * For infections other than uncomplicated UTI caused by E. coli, K. pneumoniae or P. mirabilis: Cefazolin is resistant if MIC > or = 8 mcg/mL. (Distinguishing susceptible versus intermediate for isolates with MIC < or = 4 mcg/mL requires additional testing.) For uncomplicated UTI caused by E. coli, K. pneumoniae or P. mirabilis: Cefazolin is susceptible if MIC <32 mcg/mL and predicts susceptible to the oral agents cefaclor, cefdinir, cefpodoxime, cefprozil, cefuroxime, cephalexin and loracarbef.     CEFTAZIDIME <=1 Sensitive     CEFEPIME <=1 Sensitive     CEFTRIAXONE <=1  Sensitive     CIPROFLOXACIN <=0.25 Sensitive     LEVOFLOXACIN 1 Intermediate     GENTAMICIN <=1 Sensitive     IMIPENEM <=0.25 Sensitive     NITROFURANTOIN <=16 Sensitive     PIP/TAZO <=4 Sensitive     TOBRAMYCIN <=1 Sensitive     TRIMETH/SULFA* <=20 Sensitive      * For infections other than uncomplicated UTI caused by E. coli, K. pneumoniae or P. mirabilis: Cefazolin is resistant if MIC > or = 8 mcg/mL. (Distinguishing susceptible versus intermediate for isolates with MIC < or = 4 mcg/mL requires additional testing.) For uncomplicated UTI caused by E. coli, K. pneumoniae or P. mirabilis: Cefazolin is susceptible if MIC <32 mcg/mL and predicts susceptible to the oral agents cefaclor, cefdinir, cefpodoxime, cefprozil, cefuroxime, cephalexin and loracarbef. Legend: S = Susceptible  I = Intermediate R = Resistant  NS = Not susceptible * = Not tested  NR = Not reported **NN = See antimicrobic comments     Labs: CBC: Recent Labs  Lab 05/29/22 1408 06/01/22 1158 06/02/22 0242 06/03/22 0535 06/04/22 0441  WBC 20.6* 14.4* 10.0 8.4 6.9  NEUTROABS 18,355*  --   --   --   --   HGB 16.9* 13.2 11.4* 11.9* 11.9*  HCT 48.9* 40.1 35.1* 36.4 36.5  MCV 89.2 92.6 93.1 93.6 92.4  PLT 256 220 195 201 [id number]   Basic Metabolic Panel: Recent Labs  Lab 05/29/22 1408 06/01/22 1158 06/02/22 0242 06/03/22 0535 06/04/22 0441  NA 136 131* 132* 138 136  K 4.3 4.4 4.4 4.5 3.6  CL 105 101 106 116* 113*  CO2 18* 12* 12* 11* 17*  GLUCOSE 94 63* 62* 67* 87  BUN 25 81* 76* 66* 56*  CREATININE 1.65* 4.59* 3.99* 2.84* 2.18*  CALCIUM 9.4 8.5* 7.5* 8.4* 8.6*  MG  --   --   --  2.1 1.7   Liver Function Tests: Recent Labs  Lab 05/29/22 1408 06/01/22 1158  AST 9* 45*  ALT 7 20  ALKPHOS  --  106  BILITOT 0.7 1.4*  PROT 6.8 6.2*  ALBUMIN  --  3.0*   CBG: Recent Labs  Lab 06/02/22 [id number]  GLUCAP 71    Discharge time spent: greater than 30 minutes.  Signed: Fritzi Mandes, MD [facility] 06/04/2022

## 2022-06-05 ENCOUNTER — Ambulatory Visit: Payer: Self-pay

## 2022-06-05 ENCOUNTER — Telehealth: Payer: Self-pay | Admitting: Internal Medicine

## 2022-06-05 ENCOUNTER — Telehealth: Payer: Self-pay | Admitting: *Deleted

## 2022-06-05 DIAGNOSIS — G894 Chronic pain syndrome: Secondary | ICD-10-CM | POA: Diagnosis not present

## 2022-06-05 DIAGNOSIS — F32A Depression, unspecified: Secondary | ICD-10-CM | POA: Diagnosis not present

## 2022-06-05 DIAGNOSIS — J45909 Unspecified asthma, uncomplicated: Secondary | ICD-10-CM | POA: Diagnosis not present

## 2022-06-05 DIAGNOSIS — K219 Gastro-esophageal reflux disease without esophagitis: Secondary | ICD-10-CM | POA: Diagnosis not present

## 2022-06-05 DIAGNOSIS — M1990 Unspecified osteoarthritis, unspecified site: Secondary | ICD-10-CM | POA: Diagnosis not present

## 2022-06-05 DIAGNOSIS — F419 Anxiety disorder, unspecified: Secondary | ICD-10-CM | POA: Diagnosis not present

## 2022-06-05 DIAGNOSIS — Z9181 History of falling: Secondary | ICD-10-CM | POA: Diagnosis not present

## 2022-06-05 DIAGNOSIS — F17210 Nicotine dependence, cigarettes, uncomplicated: Secondary | ICD-10-CM | POA: Diagnosis not present

## 2022-06-05 DIAGNOSIS — E785 Hyperlipidemia, unspecified: Secondary | ICD-10-CM | POA: Diagnosis not present

## 2022-06-05 DIAGNOSIS — Z79891 Long term (current) use of opiate analgesic: Secondary | ICD-10-CM | POA: Diagnosis not present

## 2022-06-05 DIAGNOSIS — I129 Hypertensive chronic kidney disease with stage 1 through stage 4 chronic kidney disease, or unspecified chronic kidney disease: Secondary | ICD-10-CM | POA: Diagnosis not present

## 2022-06-05 DIAGNOSIS — N179 Acute kidney failure, unspecified: Secondary | ICD-10-CM | POA: Diagnosis not present

## 2022-06-05 DIAGNOSIS — R339 Retention of urine, unspecified: Secondary | ICD-10-CM | POA: Diagnosis not present

## 2022-06-05 DIAGNOSIS — M797 Fibromyalgia: Secondary | ICD-10-CM | POA: Diagnosis not present

## 2022-06-05 DIAGNOSIS — Z79899 Other long term (current) drug therapy: Secondary | ICD-10-CM | POA: Diagnosis not present

## 2022-06-05 DIAGNOSIS — N1832 Chronic kidney disease, stage 3b: Secondary | ICD-10-CM | POA: Diagnosis not present

## 2022-06-05 NOTE — Telephone Encounter (Signed)
Pt advised.  She has an appointment with Rollene Fare on 03/06.  She states she is okay with waiting until the appointment.   Thanks,   -Mickel Baas

## 2022-06-05 NOTE — Progress Notes (Signed)
   CCM RN Visit Note   06-05-2022 Name: April Price MRN: [id number]      DOB: 1956/03/27  Subjective: April Price is a 67 y.o. year old female who is a primary care patient of Jearld Fenton, NP. The patient was referred to the Chronic Care Management team for assistance with care management needs subsequent to provider initiation of CCM services and plan of care.      An encounter was made due to changing of the status of the patient from enrolled in CCM services to previously enrolled. The patient does not have an active new referral for CCM services.         Plan:No further follow up required: the patient is followed in the care coordination program  Noreene Larsson RN, MSN, CCM RN Care Manager  Chronic Care Management Direct Number: [phone]

## 2022-06-05 NOTE — Telephone Encounter (Signed)
She was recently admitted to the hospital and she needs to schedule a hospital follow-up with me to get the requested items.

## 2022-06-05 NOTE — Patient Outreach (Signed)
  Care Coordination   Follow Up Visit Note   06/05/2022 Name: April Price MRN: [id number] DOB: 07/27/1955  April Price is a 67 y.o. year old female who sees Baity, Coralie Keens, NP for primary care. I spoke with  [name] by phone today.  What matters to the patients health and wellness today?  transportation   Patient called about transportation. RN had explained that she had told the sister that patient can call the customer service number on card and they will help also informed her that an informational  sheet about transportation available to her was sent out with her discharge.   SDOH assessments and interventions completed:  Yes SDOH Screenings   Food Insecurity: No Food Insecurity (06/05/2022)  Housing: Low Risk  (06/05/2022)  Transportation Needs: No Transportation Needs (06/05/2022)  Utilities: Not At Risk (06/01/2022)  Alcohol Screen: Low Risk  (07/28/2021)  Depression (PHQ2-9): Low Risk  (03/29/2022)  Financial Resource Strain: Medium Risk (03/24/2021)  Physical Activity: Insufficiently Active (03/24/2021)  Social Connections: Unknown (06/13/2021)  Stress: No Stress Concern Present (06/13/2021)  Tobacco Use: High Risk (06/01/2022)   Sister is providing transportation for one week. Then patient will decide what she needs for future appointments.    Care Coordination Interventions:  Yes, provided   Follow up plan: No further intervention required.   Encounter Outcome:  Pt. Visit Completed   [place] [facility] [phone]

## 2022-06-05 NOTE — Transitions of Care (Post Inpatient/ED Visit) (Signed)
   06/05/2022  Name: April Price MRN: [id number] DOB: 1955/07/08  Today's TOC FU Call Status: Today's TOC FU Call Status:: Successful TOC FU Call Competed TOC FU Call Complete Date: 06/05/22  Transition Care Management Follow-up Telephone Call Date of Discharge: 06/04/22 Discharge Facility: [facility]) Type of Discharge: Inpatient Admission Primary Inpatient Discharge Diagnosis:: Acute Renal Failure How have you been since you were released from the hospital?: Better (Eating good, drinking plenty of fluids) Any questions or concerns?: Yes Patient Questions/Concerns:: When sister leaves would there be transportation availabe to appointment. Patient Questions/Concerns Addressed: Other: (RN discussed with sister about transportation.  Sister will be be there for a week and provide transportation)  Items Reviewed: Did you receive and understand the discharge instructions provided?: Yes Medications obtained and verified?: Yes (Medications Reviewed) Any new allergies since your discharge?: No Dietary orders reviewed?: No Do you have support at home?: Yes People in Home: sibling(s) Name of Support/Comfort Primary Source: [facility] and Equipment/Supplies: [place] Ordered?: Yes Name of [place]:: [place] set up a time to come to your home?: Yes [place] Visit Date: 06/05/22 Any new equipment or medical supplies ordered?: Yes Name of Medical supply agency?: [employer] (tub bench and [employer]) Were you able to get the equipment/medical supplies?: Yes Do you have any questions related to the use of the equipment/supplies?: No  Functional Questionnaire: Do you need assistance with bathing/showering or dressing?: Yes Do you need assistance with meal preparation?: Yes Do you need assistance with eating?: No Do you have difficulty maintaining continence: No Do you need assistance with getting out of  bed/getting out of a chair/moving?: Yes (uses a rolling walker) Do you have difficulty managing or taking your medications?: No  Folllow up appointments reviewed: PCP Follow-up appointment confirmed?: Yes Date of PCP follow-up appointment?: 06/12/22 Follow-up Provider: [facility] Follow-up appointment confirmed?: No Reason Specialist Follow-Up Not Confirmed: Patient has Specialist Provider Number and will Call for Appointment (Patient has called specialist and they will call her back) Do you need transportation to your follow-up appointment?: No Do you understand care options if your condition(s) worsen?: Yes-patient verbalized understanding  [place]: No Food Insecurity (06/05/2022)  Housing: Low Risk  (06/05/2022)  Transportation Needs: No Transportation Needs (06/05/2022)  Utilities: Not At Risk (06/01/2022)  Alcohol Screen: Low Risk  (07/28/2021)  Depression (PHQ2-9): Low Risk  (03/29/2022)  Financial Resource Strain: Medium Risk (03/24/2021)  Physical Activity: Insufficiently Active (03/24/2021)  Social Connections: Unknown (06/13/2021)  Stress: No Stress Concern Present (06/13/2021)  Tobacco Use: High Risk (06/01/2022)      [place] Management [phone]

## 2022-06-05 NOTE — Telephone Encounter (Signed)
Pt called requesting a bedside  commode,shower bench prescription

## 2022-06-06 ENCOUNTER — Telehealth: Payer: Self-pay

## 2022-06-06 ENCOUNTER — Telehealth: Payer: Self-pay | Admitting: Internal Medicine

## 2022-06-06 NOTE — Telephone Encounter (Signed)
Home Health Verbal Orders - Caller/Agency: [place], [place] Number: [phone] Requesting: PT Frequency: [place]

## 2022-06-06 NOTE — Telephone Encounter (Signed)
   CCM RN Visit Note   06-06-2022 Name: April Price MRN: [id number]      DOB: 1955-10-19  Subjective: April Price is a 67 y.o. year old female who is a primary care patient of Jearld Fenton, NP. The patient was referred to the Chronic Care Management team for assistance with care management needs subsequent to provider initiation of CCM services and plan of care.      Today's Visit: Engaged with patient by telephone for  the patient had called and left a voicemail and ask for a call back in reference to a progress note from 06-05-2022 .    The patient was inquiring about a progress note from 06-05-2022 about no longer being enrolled in CCM services. Explained the reason for the note and told the patient she could be enrolled in CCM services at anytime to discuss with her provider. She has a provider appointment next week. Knows how to reach the [facility].      Plan:No further follow up required: will wait for a new CCM order and reach out accordingly  Noreene Larsson RN, MSN, CCM RN Care Manager  Chronic Care Management Direct Number: [phone]

## 2022-06-07 DIAGNOSIS — M5416 Radiculopathy, lumbar region: Secondary | ICD-10-CM | POA: Diagnosis not present

## 2022-06-07 NOTE — Telephone Encounter (Signed)
Okay for verbal orders as requested? 

## 2022-06-07 NOTE — Telephone Encounter (Signed)
Left message advising [place] Bells from [facility].  Thanks,   -Mickel Baas

## 2022-06-12 ENCOUNTER — Encounter: Payer: Self-pay | Admitting: Internal Medicine

## 2022-06-12 ENCOUNTER — Ambulatory Visit (INDEPENDENT_AMBULATORY_CARE_PROVIDER_SITE_OTHER): Payer: PPO | Admitting: Internal Medicine

## 2022-06-12 VITALS — BP 136/78 | HR 105 | Temp 96.8°F

## 2022-06-12 DIAGNOSIS — Z0289 Encounter for other administrative examinations: Secondary | ICD-10-CM

## 2022-06-12 DIAGNOSIS — N179 Acute kidney failure, unspecified: Secondary | ICD-10-CM | POA: Diagnosis not present

## 2022-06-12 DIAGNOSIS — D631 Anemia in chronic kidney disease: Secondary | ICD-10-CM | POA: Diagnosis not present

## 2022-06-12 DIAGNOSIS — R269 Unspecified abnormalities of gait and mobility: Secondary | ICD-10-CM | POA: Diagnosis not present

## 2022-06-12 DIAGNOSIS — I2489 Other forms of acute ischemic heart disease: Secondary | ICD-10-CM

## 2022-06-12 DIAGNOSIS — N1832 Chronic kidney disease, stage 3b: Secondary | ICD-10-CM | POA: Diagnosis not present

## 2022-06-12 DIAGNOSIS — N3000 Acute cystitis without hematuria: Secondary | ICD-10-CM | POA: Diagnosis not present

## 2022-06-12 DIAGNOSIS — R531 Weakness: Secondary | ICD-10-CM | POA: Diagnosis not present

## 2022-06-12 NOTE — Patient Instructions (Signed)
Weakness Weakness is a lack of strength. You may feel weak all over your body (generalized), or you may feel weak in one part of your body (focal). Common causes of weakness include: Infection and disorders of the body's defense system (immune system). Physical exhaustion. Internal bleeding or other blood loss that results in a lack of red blood cells (anemia). Dehydration. An imbalance in mineral (electrolyte) levels, such as potassium. Chronic kidney or liver disease. Cancer. Other causes include: Some medicines or cancer treatment. Stress, anxiety, or depression. Heart disease, circulation problems, or stroke. Nervous system disorders. Thyroid disorders. Loss of muscle strength because of age or inactivity. Poor sleep quality or sleep disorders. The cause of your weakness may not be known. Some causes of weakness can be serious, so it is important to see your health care provider. Follow these instructions at home: Activity Rest as needed. Try to get enough sleep. Most adults need 7-8 hours of quality sleep each night. Talk to your health care provider about how much sleep you need. Do exercises, such as arm curls and leg raises, for 30 minutes at least 2 days a week or as told by your health care provider. This helps build muscle strength. Consider working with a physical therapist or trainer who can develop an exercise plan to help you gain muscle strength. General instructions  Take over-the-counter and prescription medicines only as told by your health care provider. Eat a healthy, well-balanced diet. This includes: Proteins to build muscles, such as lean meats and fish. Fresh fruits and vegetables. Carbohydrates to boost energy, such as whole grains. Drink enough fluid to keep your urine pale yellow. Keep all follow-up visits. This is important. Contact a health care provider if: Your weakness does not improve or gets worse. Your weakness affects your ability to think  clearly. Your weakness affects your ability to do your normal daily activities. Get help right away if: You develop sudden weakness, especially on one side of your face or body. You have chest pain. You have trouble breathing or shortness of breath. You have problems with your vision. You have trouble talking or swallowing. You have trouble standing or walking. You are light-headed or lose consciousness. These symptoms may be an emergency. Get help right away. Call 911. Do not wait to see if the symptoms will go away. Do not drive yourself to the hospital. Summary Weakness is a lack of strength. You may feel weak all over your body or just in one specific part of your body. Weakness can be caused by a variety of things. In some cases, the cause may be unknown. Rest as needed, and try to get enough sleep. Most adults need 7-8 hours of quality sleep each night. Eat a healthy, well-balanced diet. This information is not intended to replace advice given to you by your health care provider. Make sure you discuss any questions you have with your health care provider. Document Revised: 02/27/2021 Document Reviewed: 02/27/2021 Elsevier Patient Education  2023 Elsevier Inc.  

## 2022-06-12 NOTE — Progress Notes (Signed)
Subjective:    Patient ID: April Price, female    DOB: November 20, 1955, 67 y.o.   MRN: [id number]  HPI  Patient presents to clinic today for TCM hospital follow-up.  She presented to the ER 2/22 with complaint of generalized weakness and left flank pain.  She was hypotensive.  Her creatinine was 4.59 on initial assessment.  She did have elevated troponins which was felt to be secondary to demand ischemia.  CT renal stone study showed bilateral nonobstructing renal calculi, right kidney atrophy without hydronephrosis.  Urinalysis was concerning for infection.  She was initially started on IV fluids and Bactrim but transitioned to Cipro.  Nephrology was consulted.  She was discharged on 2/25.  Since that time, she reports some improvement in her fatigue but still has generalized weakness.  She has not been having any trouble urinating.  She is requesting a home health aide to help with bathing, dressing and toileting until her energy level improves.  She would like a Rx for a transport wheelchair as well.  She also has a form that she would like to be filled out for [facility].  She has an appt with Dr. Holley Raring today at 1:30.  Review of Systems     Past Medical History:  Diagnosis Date   Allergy    Anxiety    Arthritis    Asthma    Cataract    surgery both eyes in 2011   Chronic fatigue    Depression    Depression screen    Diffuse cystic mastopathy    Fibromyalgia    Fibromyalgia muscle pain    GERD (gastroesophageal reflux disease)    Heart murmur    History of kidney stones    Hyperlipidemia 11/21/2019   Hypertension    driven by stress and pain   Stage 3a chronic kidney disease ([place]) 01/27/2021    Current Outpatient Medications  Medication Sig Dispense Refill   albuterol (PROVENTIL HFA;VENTOLIN HFA) 108 (90 Base) MCG/ACT inhaler Inhale 2 puffs into the lungs every 4 (four) hours as needed for wheezing or shortness of breath. 3 Inhaler 1    azelastine (ASTELIN) 0.1 % nasal spray Place 2 sprays into both nostrils 2 (two) times daily as needed for rhinitis. Use in each nostril as directed 30 mL 5   cyclobenzaprine (FLEXERIL) 5 MG tablet Take 1 tablet (5 mg total) by mouth 2 (two) times daily as needed for muscle spasms. 15 tablet 0   EPINEPHrine (EPIPEN 2-PAK) 0.3 mg/0.3 mL IJ SOAJ injection Inject 0.3 mLs (0.3 mg total) into the muscle once for 1 dose. 0.3 mL 0   esomeprazole (NEXIUM) 40 MG capsule TAKE 1 CAPSULE BY MOUTH EVERY DAY 90 capsule 1   fluticasone (FLONASE) 50 MCG/ACT nasal spray Place 2 sprays into both nostrils daily. 16 g 6   montelukast (SINGULAIR) 10 MG tablet Take 10 mg by mouth at bedtime.     Multiple Vitamins-Minerals (PRESERVISION AREDS 2 PO) Take 1 capsule by mouth 2 (two) times daily.     senna-docusate (SENOKOT-S) 8.6-50 MG tablet Take 1 tablet by mouth at bedtime. 30 tablet 0   traMADol (ULTRAM) 50 MG tablet Take 50 mg by mouth every 8 (eight) hours as needed.     No current facility-administered medications for this visit.    Allergies  Allergen Reactions   Keflex [Cephalexin] Anaphylaxis    Unspecified reaction per patient chart- she was started on Bactrim instead after 1-2 doses of Keflex  Patient says she had anaphylactic rxn   Penicillins Anaphylaxis    Has patient had a PCN reaction causing immediate rash, facial/tongue/throat swelling, SOB or lightheadedness with hypotension: Yes Has patient had a PCN reaction causing severe rash involving mucus membranes or skin necrosis: No Has patient had a PCN reaction that required hospitalization No Has patient had a PCN reaction occurring within the last 10 years: Yes If all of the above answers are "NO", then may proceed with Cephalosporin use.    Tylenol [Acetaminophen] Anaphylaxis, Itching and Hives    Ears and throat itch    Armoracia Rusticana Ext (Horseradish)    Morphine And Related Other (See Comments)    Depression    Nickel Itching    Pepcid [Famotidine] Nausea And Vomiting   Shrimp [Shellfish Allergy] Hives   Tape     irritation   Tetanus Toxoids Other (See Comments)    Swelling at injection site and fever   Wheat Bran    Gabapentin Other (See Comments)    Makes her feel "spacy" off balance, like she is coming out of anesthesia.     Family History  Problem Relation Age of Onset   Heart disease Mother    Arthritis Mother    Vision loss Mother    Heart disease Father    Stroke Father    Vascular Disease Sister    Heart disease Brother    Colon cancer Neg Hx    Breast cancer Neg Hx     Social History   Socioeconomic History   Marital status: Single    Spouse name: Not on file   Number of children: 0   Years of education: Not on file   Highest education level: Some college, no degree  Occupational History   Occupation: Disability  Tobacco Use   Smoking status: Every Day    Packs/day: 0.50    Years: 40.00    Total pack years: 20.00    Types: Cigarettes   Smokeless tobacco: Never  Vaping Use   Vaping Use: Never used  Substance and Sexual Activity   Alcohol use: Not Currently   Drug use: Never   Sexual activity: Not Currently    Birth control/protection: None  Other Topics Concern   Not on file  Social History Narrative   Difficulty sitting, standing for prolong periods of time.    Social Determinants of Health   Financial Resource Strain: Medium Risk (03/24/2021)   Overall Financial Resource Strain (CARDIA)    Difficulty of Paying Living Expenses: Somewhat hard  Food Insecurity: No Food Insecurity (06/05/2022)   Hunger Vital Sign    Worried About Running Out of Food in the Last Year: Never true    Ran Out of Food in the Last Year: Never true  Transportation Needs: No Transportation Needs (06/05/2022)   PRAPARE - Hydrologist (Medical): No    Lack of Transportation (Non-Medical): No  Physical Activity: Insufficiently Active (03/24/2021)   Exercise Vital Sign     Days of Exercise per Week: 2 days    Minutes of Exercise per Session: 30 min  Stress: No Stress Concern Present (06/13/2021)   [place]    Feeling of Stress : Only a little  Social Connections: Unknown (06/13/2021)   Social Connection and Isolation Panel [NHANES]    Frequency of Communication with Friends and Family: More than three times a week    Frequency of Social Gatherings with  Friends and Family: Once a week    Attends Religious Services: More than 4 times per year    Active Member of [employer] or Organizations: Yes    Attends Archivist Meetings: More than 4 times per year    Marital Status: Patient refused  Intimate Partner Violence: Not At Risk (06/01/2022)   Humiliation, Afraid, Rape, and Kick questionnaire    Fear of Current or Ex-Partner: No    Emotionally Abused: No    Physically Abused: No    Sexually Abused: No     Constitutional: Patient reports chronic fatigue.  Denies fever, malaise, headache or abrupt weight changes.  Respiratory: Denies difficulty breathing, shortness of breath, cough or sputum production.   Cardiovascular: Denies chest pain, chest tightness, palpitations or swelling in the hands or feet.  GU: Denies urgency, frequency, pain with urination, burning sensation, blood in urine, odor or discharge. Musculoskeletal: Patient reports chronic joint pain, muscle pain, difficulty with gait.  Denies decrease in range of motion, or joint swelling.  Neurological: Pt reports problems with balance. Denies dizziness, difficulty with memory, difficulty with speech or problems with coordination.   No other specific complaints in a complete review of systems (except as listed in HPI above).  Objective:   Physical Exam  BP 136/78 (BP Location: Right Arm, Patient Position: Sitting, Cuff Size: Normal)   Pulse (!) 105   Temp (!) 96.8 F (36 C) (Temporal)   SpO2 94%   Wt Readings from Last 3  Encounters:  06/02/22 159 lb 9.8 oz (72.4 kg)  01/30/22 155 lb (70.3 kg)  12/20/21 149 lb (67.6 kg)    General: Appears her stated age, well developed, well nourished in NAD. Skin: Warm, dry and intact.  HEENT: Head: normal shape and size; Eyes: sclera white, no icterus, conjunctiva pink, PERRLA and EOMs intact;  Cardiovascular: Normal rate and rhythm. S1,S2 noted.  ? Murmur noted. No JVD or BLE edema.  Pulmonary/Chest: Normal effort and positive vesicular breath sounds. No respiratory distress. No wheezes, rales or ronchi noted.  Musculoskeletal: In wheelchair. Neurological: Alert and oriented.Coordination normal.    BMET    Component Value Date/Time   NA 136 06/04/2022 0441   K 3.6 06/04/2022 0441   CL 113 (H) 06/04/2022 0441   CO2 17 (L) 06/04/2022 0441   GLUCOSE 87 06/04/2022 0441   BUN 56 (H) 06/04/2022 0441   CREATININE 2.18 (H) 06/04/2022 0441   CREATININE 1.65 (H) 05/29/2022 1408   CALCIUM 8.6 (L) 06/04/2022 0441   GFRNONAA 24 (L) 06/04/2022 0441   GFRNONAA 50 (L) 11/21/2019 0829   GFRAA 57 (L) 11/21/2019 0829    Lipid Panel     Component Value Date/Time   CHOL 256 (H) 01/30/2022 0856   TRIG 162 (H) 01/30/2022 0856   HDL 52 01/30/2022 0856   CHOLHDL 4.9 01/30/2022 0856   LDLCALC 173 (H) 01/30/2022 0856    CBC    Component Value Date/Time   WBC 6.9 06/04/2022 0441   RBC 3.95 06/04/2022 0441   HGB 11.9 (L) 06/04/2022 0441   HCT 36.5 06/04/2022 0441   PLT 200 06/04/2022 0441   MCV 92.4 06/04/2022 0441   MCH 30.1 06/04/2022 0441   MCHC 32.6 06/04/2022 0441   RDW 14.4 06/04/2022 0441   LYMPHSABS 618 (L) 05/29/2022 1408   MONOABS 0.6 06/01/2016 0901   EOSABS 0 (L) 05/29/2022 1408   BASOSABS 124 05/29/2022 1408    Hgb A1C Lab Results  Component Value Date   HGBA1C  5.5 01/30/2022           Assessment & Plan:   TCM hospital follow-up for Acute Kidney Injury, UTI and Demand Ischemia:  Hospital notes, labs and imaging reviewed Encourage  adequate water intake She has a follow-up appointment with nephrology today so will not take any labs at this time Continue current medications  Generalized Weakness, Difficulty with Gait, Encounter for Form Completion with Patient:  Referral to home health nursing for home health aide Rx for DME wheelchair Will fill out form for [employer] transportation support   RTC in 1 month for your annual exam Webb Silversmith, NP

## 2022-06-13 ENCOUNTER — Ambulatory Visit
Admission: RE | Admit: 2022-06-13 | Discharge: 2022-06-13 | Disposition: A | Discharge status: Home / Self Care | Payer: PPO | Source: Ambulatory Visit | Attending: Physician Assistant | Admitting: Physician Assistant

## 2022-06-13 DIAGNOSIS — F419 Anxiety disorder, unspecified: Secondary | ICD-10-CM | POA: Diagnosis not present

## 2022-06-13 DIAGNOSIS — Z13820 Encounter for screening for osteoporosis: Secondary | ICD-10-CM | POA: Insufficient documentation

## 2022-06-13 DIAGNOSIS — K219 Gastro-esophageal reflux disease without esophagitis: Secondary | ICD-10-CM | POA: Diagnosis not present

## 2022-06-13 DIAGNOSIS — J45909 Unspecified asthma, uncomplicated: Secondary | ICD-10-CM | POA: Diagnosis not present

## 2022-06-13 DIAGNOSIS — Z79899 Other long term (current) drug therapy: Secondary | ICD-10-CM | POA: Diagnosis not present

## 2022-06-13 DIAGNOSIS — G894 Chronic pain syndrome: Secondary | ICD-10-CM | POA: Diagnosis not present

## 2022-06-13 DIAGNOSIS — Z79891 Long term (current) use of opiate analgesic: Secondary | ICD-10-CM | POA: Diagnosis not present

## 2022-06-13 DIAGNOSIS — I129 Hypertensive chronic kidney disease with stage 1 through stage 4 chronic kidney disease, or unspecified chronic kidney disease: Secondary | ICD-10-CM | POA: Diagnosis not present

## 2022-06-13 DIAGNOSIS — Z9181 History of falling: Secondary | ICD-10-CM | POA: Diagnosis not present

## 2022-06-13 DIAGNOSIS — N179 Acute kidney failure, unspecified: Secondary | ICD-10-CM | POA: Diagnosis not present

## 2022-06-13 DIAGNOSIS — M810 Age-related osteoporosis without current pathological fracture: Secondary | ICD-10-CM | POA: Insufficient documentation

## 2022-06-13 DIAGNOSIS — M1990 Unspecified osteoarthritis, unspecified site: Secondary | ICD-10-CM | POA: Diagnosis not present

## 2022-06-13 DIAGNOSIS — E785 Hyperlipidemia, unspecified: Secondary | ICD-10-CM | POA: Diagnosis not present

## 2022-06-13 DIAGNOSIS — M797 Fibromyalgia: Secondary | ICD-10-CM | POA: Diagnosis not present

## 2022-06-13 DIAGNOSIS — N1832 Chronic kidney disease, stage 3b: Secondary | ICD-10-CM | POA: Diagnosis not present

## 2022-06-13 DIAGNOSIS — F32A Depression, unspecified: Secondary | ICD-10-CM | POA: Diagnosis not present

## 2022-06-13 DIAGNOSIS — Z780 Asymptomatic menopausal state: Secondary | ICD-10-CM | POA: Diagnosis not present

## 2022-06-13 DIAGNOSIS — Z1231 Encounter for screening mammogram for malignant neoplasm of breast: Secondary | ICD-10-CM | POA: Diagnosis not present

## 2022-06-13 DIAGNOSIS — R339 Retention of urine, unspecified: Secondary | ICD-10-CM | POA: Diagnosis not present

## 2022-06-13 DIAGNOSIS — F17210 Nicotine dependence, cigarettes, uncomplicated: Secondary | ICD-10-CM | POA: Diagnosis not present

## 2022-06-16 DIAGNOSIS — F17210 Nicotine dependence, cigarettes, uncomplicated: Secondary | ICD-10-CM | POA: Diagnosis not present

## 2022-06-16 DIAGNOSIS — M797 Fibromyalgia: Secondary | ICD-10-CM | POA: Diagnosis not present

## 2022-06-16 DIAGNOSIS — R339 Retention of urine, unspecified: Secondary | ICD-10-CM | POA: Diagnosis not present

## 2022-06-16 DIAGNOSIS — E785 Hyperlipidemia, unspecified: Secondary | ICD-10-CM | POA: Diagnosis not present

## 2022-06-16 DIAGNOSIS — Z79899 Other long term (current) drug therapy: Secondary | ICD-10-CM | POA: Diagnosis not present

## 2022-06-16 DIAGNOSIS — J45909 Unspecified asthma, uncomplicated: Secondary | ICD-10-CM | POA: Diagnosis not present

## 2022-06-16 DIAGNOSIS — N1832 Chronic kidney disease, stage 3b: Secondary | ICD-10-CM | POA: Diagnosis not present

## 2022-06-16 DIAGNOSIS — Z79891 Long term (current) use of opiate analgesic: Secondary | ICD-10-CM | POA: Diagnosis not present

## 2022-06-16 DIAGNOSIS — M1990 Unspecified osteoarthritis, unspecified site: Secondary | ICD-10-CM | POA: Diagnosis not present

## 2022-06-16 DIAGNOSIS — I129 Hypertensive chronic kidney disease with stage 1 through stage 4 chronic kidney disease, or unspecified chronic kidney disease: Secondary | ICD-10-CM | POA: Diagnosis not present

## 2022-06-16 DIAGNOSIS — N179 Acute kidney failure, unspecified: Secondary | ICD-10-CM | POA: Diagnosis not present

## 2022-06-16 DIAGNOSIS — G894 Chronic pain syndrome: Secondary | ICD-10-CM | POA: Diagnosis not present

## 2022-06-16 DIAGNOSIS — F32A Depression, unspecified: Secondary | ICD-10-CM | POA: Diagnosis not present

## 2022-06-16 DIAGNOSIS — Z9181 History of falling: Secondary | ICD-10-CM | POA: Diagnosis not present

## 2022-06-16 DIAGNOSIS — F419 Anxiety disorder, unspecified: Secondary | ICD-10-CM | POA: Diagnosis not present

## 2022-06-16 DIAGNOSIS — K219 Gastro-esophageal reflux disease without esophagitis: Secondary | ICD-10-CM | POA: Diagnosis not present

## 2022-06-16 NOTE — Progress Notes (Signed)
Mammogram did not find evidence of malignancy Recommended repeat screening mammogram in one year.

## 2022-06-20 ENCOUNTER — Telehealth: Payer: Self-pay

## 2022-06-20 DIAGNOSIS — Z79899 Other long term (current) drug therapy: Secondary | ICD-10-CM | POA: Diagnosis not present

## 2022-06-20 DIAGNOSIS — J45909 Unspecified asthma, uncomplicated: Secondary | ICD-10-CM | POA: Diagnosis not present

## 2022-06-20 DIAGNOSIS — F419 Anxiety disorder, unspecified: Secondary | ICD-10-CM | POA: Diagnosis not present

## 2022-06-20 DIAGNOSIS — N1832 Chronic kidney disease, stage 3b: Secondary | ICD-10-CM | POA: Diagnosis not present

## 2022-06-20 DIAGNOSIS — Z79891 Long term (current) use of opiate analgesic: Secondary | ICD-10-CM | POA: Diagnosis not present

## 2022-06-20 DIAGNOSIS — G894 Chronic pain syndrome: Secondary | ICD-10-CM | POA: Diagnosis not present

## 2022-06-20 DIAGNOSIS — N179 Acute kidney failure, unspecified: Secondary | ICD-10-CM | POA: Diagnosis not present

## 2022-06-20 DIAGNOSIS — I129 Hypertensive chronic kidney disease with stage 1 through stage 4 chronic kidney disease, or unspecified chronic kidney disease: Secondary | ICD-10-CM | POA: Diagnosis not present

## 2022-06-20 DIAGNOSIS — M797 Fibromyalgia: Secondary | ICD-10-CM | POA: Diagnosis not present

## 2022-06-20 DIAGNOSIS — R339 Retention of urine, unspecified: Secondary | ICD-10-CM | POA: Diagnosis not present

## 2022-06-20 DIAGNOSIS — E785 Hyperlipidemia, unspecified: Secondary | ICD-10-CM | POA: Diagnosis not present

## 2022-06-20 DIAGNOSIS — F17210 Nicotine dependence, cigarettes, uncomplicated: Secondary | ICD-10-CM | POA: Diagnosis not present

## 2022-06-20 DIAGNOSIS — Z9181 History of falling: Secondary | ICD-10-CM | POA: Diagnosis not present

## 2022-06-20 DIAGNOSIS — M1990 Unspecified osteoarthritis, unspecified site: Secondary | ICD-10-CM | POA: Diagnosis not present

## 2022-06-20 DIAGNOSIS — F32A Depression, unspecified: Secondary | ICD-10-CM | POA: Diagnosis not present

## 2022-06-20 DIAGNOSIS — K219 Gastro-esophageal reflux disease without esophagitis: Secondary | ICD-10-CM | POA: Diagnosis not present

## 2022-06-20 MED ORDER — LISINOPRIL 5 MG PO TABS: 5.0000 mg | ORAL_TABLET | Freq: Every day | ORAL | 1 refills | Status: DC

## 2022-06-20 NOTE — Telephone Encounter (Signed)
Yes would recommend she restart lisinopril at previous dose

## 2022-06-20 NOTE — Telephone Encounter (Signed)
Pt advised, a refill has been sent to her pharmacy.   Thanks,   -Mickel Baas

## 2022-06-20 NOTE — Telephone Encounter (Signed)
Thomas from Physical Therapy was not able to see her today.  He states her BP was elevated.  182/98 and 180/96 10 minutes later.  He states the hospital stopped her lisinopril.  Does she need to restart?   Thanks,   -Mickel Baas

## 2022-06-20 NOTE — Addendum Note (Signed)
Addended by: Ashley Royalty E on: 06/20/2022 04:48 PM   Modules accepted: Orders

## 2022-06-20 NOTE — Progress Notes (Signed)
Your bone density scan demonstrates that you have osteoporosis. Please continue your medications and supplements to help improve your bone strength.

## 2022-06-26 ENCOUNTER — Telehealth: Payer: Self-pay | Admitting: Internal Medicine

## 2022-06-26 DIAGNOSIS — N179 Acute kidney failure, unspecified: Secondary | ICD-10-CM | POA: Diagnosis not present

## 2022-06-26 NOTE — Telephone Encounter (Signed)
Contacted April Price to schedule their annual wellness visit. Appointment made for 07/27/2022.  Sherol Dade; Care Guide [facility] Direct Dial: [phone]

## 2022-06-27 ENCOUNTER — Other Ambulatory Visit: Payer: Self-pay | Admitting: Internal Medicine

## 2022-06-27 DIAGNOSIS — R269 Unspecified abnormalities of gait and mobility: Secondary | ICD-10-CM

## 2022-06-27 DIAGNOSIS — R531 Weakness: Secondary | ICD-10-CM

## 2022-06-30 DIAGNOSIS — N179 Acute kidney failure, unspecified: Secondary | ICD-10-CM | POA: Diagnosis not present

## 2022-07-03 DIAGNOSIS — N179 Acute kidney failure, unspecified: Secondary | ICD-10-CM | POA: Diagnosis not present

## 2022-07-05 DIAGNOSIS — Z79891 Long term (current) use of opiate analgesic: Secondary | ICD-10-CM | POA: Diagnosis not present

## 2022-07-05 DIAGNOSIS — Z79899 Other long term (current) drug therapy: Secondary | ICD-10-CM | POA: Diagnosis not present

## 2022-07-05 DIAGNOSIS — R339 Retention of urine, unspecified: Secondary | ICD-10-CM | POA: Diagnosis not present

## 2022-07-05 DIAGNOSIS — Z9181 History of falling: Secondary | ICD-10-CM | POA: Diagnosis not present

## 2022-07-05 DIAGNOSIS — G894 Chronic pain syndrome: Secondary | ICD-10-CM | POA: Diagnosis not present

## 2022-07-05 DIAGNOSIS — F17210 Nicotine dependence, cigarettes, uncomplicated: Secondary | ICD-10-CM | POA: Diagnosis not present

## 2022-07-05 DIAGNOSIS — K219 Gastro-esophageal reflux disease without esophagitis: Secondary | ICD-10-CM | POA: Diagnosis not present

## 2022-07-05 DIAGNOSIS — N179 Acute kidney failure, unspecified: Secondary | ICD-10-CM | POA: Diagnosis not present

## 2022-07-05 DIAGNOSIS — F32A Depression, unspecified: Secondary | ICD-10-CM | POA: Diagnosis not present

## 2022-07-05 DIAGNOSIS — J45909 Unspecified asthma, uncomplicated: Secondary | ICD-10-CM | POA: Diagnosis not present

## 2022-07-05 DIAGNOSIS — M1990 Unspecified osteoarthritis, unspecified site: Secondary | ICD-10-CM | POA: Diagnosis not present

## 2022-07-05 DIAGNOSIS — F419 Anxiety disorder, unspecified: Secondary | ICD-10-CM | POA: Diagnosis not present

## 2022-07-05 DIAGNOSIS — I129 Hypertensive chronic kidney disease with stage 1 through stage 4 chronic kidney disease, or unspecified chronic kidney disease: Secondary | ICD-10-CM | POA: Diagnosis not present

## 2022-07-05 DIAGNOSIS — E785 Hyperlipidemia, unspecified: Secondary | ICD-10-CM | POA: Diagnosis not present

## 2022-07-05 DIAGNOSIS — M797 Fibromyalgia: Secondary | ICD-10-CM | POA: Diagnosis not present

## 2022-07-05 DIAGNOSIS — N1832 Chronic kidney disease, stage 3b: Secondary | ICD-10-CM | POA: Diagnosis not present

## 2022-07-13 DIAGNOSIS — K219 Gastro-esophageal reflux disease without esophagitis: Secondary | ICD-10-CM | POA: Diagnosis not present

## 2022-07-13 DIAGNOSIS — I129 Hypertensive chronic kidney disease with stage 1 through stage 4 chronic kidney disease, or unspecified chronic kidney disease: Secondary | ICD-10-CM | POA: Diagnosis not present

## 2022-07-13 DIAGNOSIS — N1832 Chronic kidney disease, stage 3b: Secondary | ICD-10-CM | POA: Diagnosis not present

## 2022-07-13 DIAGNOSIS — E785 Hyperlipidemia, unspecified: Secondary | ICD-10-CM | POA: Diagnosis not present

## 2022-07-13 DIAGNOSIS — N179 Acute kidney failure, unspecified: Secondary | ICD-10-CM | POA: Diagnosis not present

## 2022-07-13 DIAGNOSIS — Z79891 Long term (current) use of opiate analgesic: Secondary | ICD-10-CM | POA: Diagnosis not present

## 2022-07-13 DIAGNOSIS — Z79899 Other long term (current) drug therapy: Secondary | ICD-10-CM | POA: Diagnosis not present

## 2022-07-13 DIAGNOSIS — R339 Retention of urine, unspecified: Secondary | ICD-10-CM | POA: Diagnosis not present

## 2022-07-13 DIAGNOSIS — F32A Depression, unspecified: Secondary | ICD-10-CM | POA: Diagnosis not present

## 2022-07-13 DIAGNOSIS — Z9181 History of falling: Secondary | ICD-10-CM | POA: Diagnosis not present

## 2022-07-13 DIAGNOSIS — M1990 Unspecified osteoarthritis, unspecified site: Secondary | ICD-10-CM | POA: Diagnosis not present

## 2022-07-13 DIAGNOSIS — G894 Chronic pain syndrome: Secondary | ICD-10-CM | POA: Diagnosis not present

## 2022-07-13 DIAGNOSIS — F419 Anxiety disorder, unspecified: Secondary | ICD-10-CM | POA: Diagnosis not present

## 2022-07-13 DIAGNOSIS — J45909 Unspecified asthma, uncomplicated: Secondary | ICD-10-CM | POA: Diagnosis not present

## 2022-07-13 DIAGNOSIS — M797 Fibromyalgia: Secondary | ICD-10-CM | POA: Diagnosis not present

## 2022-07-13 DIAGNOSIS — F17210 Nicotine dependence, cigarettes, uncomplicated: Secondary | ICD-10-CM | POA: Diagnosis not present

## 2022-07-17 DIAGNOSIS — N179 Acute kidney failure, unspecified: Secondary | ICD-10-CM | POA: Diagnosis not present

## 2022-07-20 DIAGNOSIS — D631 Anemia in chronic kidney disease: Secondary | ICD-10-CM | POA: Diagnosis not present

## 2022-07-20 DIAGNOSIS — N1832 Chronic kidney disease, stage 3b: Secondary | ICD-10-CM | POA: Diagnosis not present

## 2022-07-20 DIAGNOSIS — R809 Proteinuria, unspecified: Secondary | ICD-10-CM | POA: Diagnosis not present

## 2022-07-24 DIAGNOSIS — N179 Acute kidney failure, unspecified: Secondary | ICD-10-CM | POA: Diagnosis not present

## 2022-07-27 ENCOUNTER — Ambulatory Visit (INDEPENDENT_AMBULATORY_CARE_PROVIDER_SITE_OTHER): Payer: PPO

## 2022-07-27 VITALS — Wt 155.0 lb

## 2022-07-27 DIAGNOSIS — Z79891 Long term (current) use of opiate analgesic: Secondary | ICD-10-CM | POA: Diagnosis not present

## 2022-07-27 DIAGNOSIS — N1832 Chronic kidney disease, stage 3b: Secondary | ICD-10-CM | POA: Diagnosis not present

## 2022-07-27 DIAGNOSIS — G894 Chronic pain syndrome: Secondary | ICD-10-CM | POA: Diagnosis not present

## 2022-07-27 DIAGNOSIS — E785 Hyperlipidemia, unspecified: Secondary | ICD-10-CM | POA: Diagnosis not present

## 2022-07-27 DIAGNOSIS — F32A Depression, unspecified: Secondary | ICD-10-CM | POA: Diagnosis not present

## 2022-07-27 DIAGNOSIS — I129 Hypertensive chronic kidney disease with stage 1 through stage 4 chronic kidney disease, or unspecified chronic kidney disease: Secondary | ICD-10-CM | POA: Diagnosis not present

## 2022-07-27 DIAGNOSIS — Z0000 Encounter for general adult medical examination without abnormal findings: Secondary | ICD-10-CM

## 2022-07-27 DIAGNOSIS — F419 Anxiety disorder, unspecified: Secondary | ICD-10-CM | POA: Diagnosis not present

## 2022-07-27 DIAGNOSIS — J45909 Unspecified asthma, uncomplicated: Secondary | ICD-10-CM | POA: Diagnosis not present

## 2022-07-27 DIAGNOSIS — M1990 Unspecified osteoarthritis, unspecified site: Secondary | ICD-10-CM | POA: Diagnosis not present

## 2022-07-27 DIAGNOSIS — N179 Acute kidney failure, unspecified: Secondary | ICD-10-CM | POA: Diagnosis not present

## 2022-07-27 DIAGNOSIS — Z79899 Other long term (current) drug therapy: Secondary | ICD-10-CM | POA: Diagnosis not present

## 2022-07-27 DIAGNOSIS — F17210 Nicotine dependence, cigarettes, uncomplicated: Secondary | ICD-10-CM | POA: Diagnosis not present

## 2022-07-27 DIAGNOSIS — K219 Gastro-esophageal reflux disease without esophagitis: Secondary | ICD-10-CM | POA: Diagnosis not present

## 2022-07-27 DIAGNOSIS — Z9181 History of falling: Secondary | ICD-10-CM | POA: Diagnosis not present

## 2022-07-27 DIAGNOSIS — M797 Fibromyalgia: Secondary | ICD-10-CM | POA: Diagnosis not present

## 2022-07-27 DIAGNOSIS — R339 Retention of urine, unspecified: Secondary | ICD-10-CM | POA: Diagnosis not present

## 2022-07-27 NOTE — Patient Instructions (Signed)
April Price , Thank you for taking time to come for your Medicare Wellness Visit. I appreciate your ongoing commitment to your health goals. Please review the following plan we discussed and let me know if I can assist you in the future.   These are the goals we discussed:  Goals      DIET - EAT MORE FRUITS AND VEGETABLES     Increase water intake     Recommend drinking at least 6-7 glasses of water a day      Patient Stated     06/01/2020, wants to reduce pain so she can do more     PharmD - Medication Assistance     CARE PLAN ENTRY (see longitudinal plan of care for additional care plan information)   Current Barriers:  Chronic Disease Management support, education, and care coordination needs related to HTN, HLD, fibromyalgia, chronic pain and Vitamin D deficiency  Financial Barriers  Pharmacist Clinical Goal(s):  Over the next 30 days, patient will work with CM Pharmacist to address needs related to medication assistance and complete medication review  Interventions: Inter-disciplinary care team collaboration (see longitudinal plan of care) Collaborated with PCP on 8/16 Recommend addition of ezetimibe to pravastatin 20 mg daily for further LDL lowering as patient declines intensification of statin therapy. Provider agrees.  Rx for ezetimibe sent to pharmacy.  Provider discontinues Rx for atorvastatin. Discuss patient's pain control, ibuprofen use and renal impairment Provider states will consider pain specialist referral with patient. Confirms advises patient to start aspirin 81 mg daily Follow up with patient today Counsel on addition of ezetimibe to pravastatin for further LDL lowering. Patient confirms will pick up ezetimibe Rx (as well as Vitamin D Rx) from pharmacy Again encourage patient to limit NSAID use, only using as needed Counsel on use of topical diclofenac gel as alternative to oral NSAID for pain relief. April Price reports she has tried diclofenac gel in  past, but with limited success due to how many areas of body affected by fibromyalgia pain Encourage patient to follow up with PCP for further discussion/as needed for worsening pain Advise patient confirmed with PCP- provider advises patient to start aspirin 81 mg daily for ASCVD prevention. Again encourage patient to follow up with Social Security regarding extra help/LIS application/status Counsel patient on [employer] for medication assistance Note Hypercholesterolemia - [employer] currently open. Provide patient with phone number to apply for program  Patient Self Care Activities:  Attends all scheduled provider appointments Calls pharmacy for medication refills Calls provider office for new concerns or questions   Please see past updates related to this goal by clicking on the "Past Updates" button in the selected goal       Quit smoking / using tobacco     Smoking cessation discussed         This is a list of the screening recommended for you and due dates:  Health Maintenance  Topic Date Due   DTaP/Tdap/Td vaccine (1 - Tdap) Never done   Screening for Lung Cancer  Never done   Zoster (Shingles) Vaccine (1 of 2) Never done   COVID-19 Vaccine (6 - 2023-24 season) 12/09/2021   Flu Shot  11/09/2022   Cologuard (Stool DNA test)  02/09/2023   Mammogram  06/13/2023   Medicare Annual Wellness Visit  07/27/2023   Pneumonia Vaccine  Completed   DEXA scan (bone density measurement)  Completed   Hepatitis C Screening: USPSTF Recommendation to screen - Ages 5-79 yo.  Completed   HPV Vaccine  Aged Out    Advanced directives: NO  Conditions/risks identified: NONE  Next appointment: Follow up in one year for your annual wellness visit 08/02/23 @ 1:30 PM BY PHONE   Preventive Care 65 Years and Older, Female Preventive care refers to lifestyle choices and visits with your health care provider that can promote health and wellness. What does preventive care  include? A yearly physical exam. This is also called an annual well check. Dental exams once or twice a year. Routine eye exams. Ask your health care provider how often you should have your eyes checked. Personal lifestyle choices, including: Daily care of your teeth and gums. Regular physical activity. Eating a healthy diet. Avoiding tobacco and drug use. Limiting alcohol use. Practicing safe sex. Taking low-dose aspirin every day. Taking vitamin and mineral supplements as recommended by your health care provider. What happens during an annual well check? The services and screenings done by your health care provider during your annual well check will depend on your age, overall health, lifestyle risk factors, and family history of disease. Counseling  Your health care provider may ask you questions about your: Alcohol use. Tobacco use. Drug use. Emotional well-being. Home and relationship well-being. Sexual activity. Eating habits. History of falls. Memory and ability to understand (cognition). Work and work Astronomer. Reproductive health. Screening  You may have the following tests or measurements: Height, weight, and BMI. Blood pressure. Lipid and cholesterol levels. These may be checked every 5 years, or more frequently if you are over 61 years old. Skin check. Lung cancer screening. You may have this screening every year starting at age 43 if you have a 30-pack-year history of smoking and currently smoke or have quit within the past 15 years. Fecal occult blood test (FOBT) of the stool. You may have this test every year starting at age 71. Flexible sigmoidoscopy or colonoscopy. You may have a sigmoidoscopy every 5 years or a colonoscopy every 10 years starting at age 25. Hepatitis C blood test. Hepatitis B blood test. Sexually transmitted disease (STD) testing. Diabetes screening. This is done by checking your blood sugar (glucose) after you have not eaten for a while  (fasting). You may have this done every 1-3 years. Bone density scan. This is done to screen for osteoporosis. You may have this done starting at age 87. Mammogram. This may be done every 1-2 years. Talk to your health care provider about how often you should have regular mammograms. Talk with your health care provider about your test results, treatment options, and if necessary, the need for more tests. Vaccines  Your health care provider may recommend certain vaccines, such as: Influenza vaccine. This is recommended every year. Tetanus, diphtheria, and acellular pertussis (Tdap, Td) vaccine. You may need a Td booster every 10 years. Zoster vaccine. You may need this after age 73. Pneumococcal 13-valent conjugate (PCV13) vaccine. One dose is recommended after age 51. Pneumococcal polysaccharide (PPSV23) vaccine. One dose is recommended after age 55. Talk to your health care provider about which screenings and vaccines you need and how often you need them. This information is not intended to replace advice given to you by your health care provider. Make sure you discuss any questions you have with your health care provider. Document Released: 04/23/2015 Document Revised: 12/15/2015 Document Reviewed: 01/26/2015 Elsevier Interactive Patient Education  2017 [employer].  Fall Prevention in the Home Falls can cause injuries. They can happen to people of all ages. There  are many things you can do to make your home safe and to help prevent falls. What can I do on the outside of my home? Regularly fix the edges of walkways and driveways and fix any cracks. Remove anything that might make you trip as you walk through a door, such as a raised step or threshold. Trim any bushes or trees on the path to your home. Use bright outdoor lighting. Clear any walking paths of anything that might make someone trip, such as rocks or tools. Regularly check to see if handrails are loose or broken. Make sure that  both sides of any steps have handrails. Any raised decks and porches should have guardrails on the edges. Have any leaves, snow, or ice cleared regularly. Use sand or salt on walking paths during winter. Clean up any spills in your garage right away. This includes oil or grease spills. What can I do in the bathroom? Use night lights. Install grab bars by the toilet and in the tub and shower. Do not use towel bars as grab bars. Use non-skid mats or decals in the tub or shower. If you need to sit down in the shower, use a plastic, non-slip stool. Keep the floor dry. Clean up any water that spills on the floor as soon as it happens. Remove soap buildup in the tub or shower regularly. Attach bath mats securely with double-sided non-slip rug tape. Do not have throw rugs and other things on the floor that can make you trip. What can I do in the bedroom? Use night lights. Make sure that you have a light by your bed that is easy to reach. Do not use any sheets or blankets that are too big for your bed. They should not hang down onto the floor. Have a firm chair that has side arms. You can use this for support while you get dressed. Do not have throw rugs and other things on the floor that can make you trip. What can I do in the kitchen? Clean up any spills right away. Avoid walking on wet floors. Keep items that you use a lot in easy-to-reach places. If you need to reach something above you, use a strong step stool that has a grab bar. Keep electrical cords out of the way. Do not use floor polish or wax that makes floors slippery. If you must use wax, use non-skid floor wax. Do not have throw rugs and other things on the floor that can make you trip. What can I do with my stairs? Do not leave any items on the stairs. Make sure that there are handrails on both sides of the stairs and use them. Fix handrails that are broken or loose. Make sure that handrails are as long as the stairways. Check  any carpeting to make sure that it is firmly attached to the stairs. Fix any carpet that is loose or worn. Avoid having throw rugs at the top or bottom of the stairs. If you do have throw rugs, attach them to the floor with carpet tape. Make sure that you have a light switch at the top of the stairs and the bottom of the stairs. If you do not have them, ask someone to add them for you. What else can I do to help prevent falls? Wear shoes that: Do not have high heels. Have rubber bottoms. Are comfortable and fit you well. Are closed at the toe. Do not wear sandals. If you use a stepladder: Make sure  that it is fully opened. Do not climb a closed stepladder. Make sure that both sides of the stepladder are locked into place. Ask someone to hold it for you, if possible. Clearly mark and make sure that you can see: Any grab bars or handrails. First and last steps. Where the edge of each step is. Use tools that help you move around (mobility aids) if they are needed. These include: Canes. Walkers. Scooters. Crutches. Turn on the lights when you go into a dark area. Replace any light bulbs as soon as they burn out. Set up your furniture so you have a clear path. Avoid moving your furniture around. If any of your floors are uneven, fix them. If there are any pets around you, be aware of where they are. Review your medicines with your doctor. Some medicines can make you feel dizzy. This can increase your chance of falling. Ask your doctor what other things that you can do to help prevent falls. This information is not intended to replace advice given to you by your health care provider. Make sure you discuss any questions you have with your health care provider. Document Released: 01/21/2009 Document Revised: 09/02/2015 Document Reviewed: 05/01/2014 Elsevier Interactive Patient Education  2017 [employer].

## 2022-07-27 NOTE — Progress Notes (Signed)
I connected with  April Price on 07/27/22 by a audio enabled telemedicine application and verified that I am speaking with the correct person using two identifiers.  Patient Location: Home  Provider Location: Office/Clinic  I discussed the limitations of evaluation and management by telemedicine. The patient expressed understanding and agreed to proceed.  Subjective:   April Price is a 67 y.o. female who presents for Medicare Annual (Subsequent) preventive examination.  Review of Systems     Cardiac Risk Factors include: advanced age (>21men, >52 women);dyslipidemia;sedentary lifestyle;smoking/ tobacco exposure;hypertension     Objective:    Today's Vitals   07/27/22 1334  PainSc: 4    There is no height or weight on file to calculate BMI.     07/27/2022    1:43 PM 06/01/2022    2:00 PM 06/01/2022   11:55 AM 06/01/2020    9:08 AM 05/27/2019    8:29 AM 05/21/2018    8:46 AM 02/13/2017    1:40 PM  Advanced Directives  Does Patient Have a Medical Advance Directive? No No No Yes Yes Yes Yes  Type of Theme park manager;Living will Healthcare Power of Attorney Living will;Healthcare Power of [employer] Power of [place];Living will  Copy of Healthcare Power of Attorney in Chart?    No - copy requested  No - copy requested No - copy requested  Would patient like information on creating a medical advance directive? No - Patient declined No - Patient declined         Current Medications (verified) Outpatient Encounter Medications as of 07/27/2022  Medication Sig   cyclobenzaprine (FLEXERIL) 5 MG tablet Take 1 tablet (5 mg total) by mouth 2 (two) times daily as needed for muscle spasms.   esomeprazole (NEXIUM) 40 MG capsule TAKE 1 CAPSULE BY MOUTH EVERY DAY   fluticasone (FLONASE) 50 MCG/ACT nasal spray Place 2 sprays into both nostrils daily.   lisinopril (ZESTRIL) 5 MG tablet Take 1 tablet (5 mg total) by mouth daily.    montelukast (SINGULAIR) 10 MG tablet Take 10 mg by mouth at bedtime.   Multiple Vitamins-Minerals (PRESERVISION AREDS 2 PO) Take 1 capsule by mouth 2 (two) times daily.   traMADol (ULTRAM) 50 MG tablet Take 50 mg by mouth every 8 (eight) hours as needed.   albuterol (PROVENTIL HFA;VENTOLIN HFA) 108 (90 Base) MCG/ACT inhaler Inhale 2 puffs into the lungs every 4 (four) hours as needed for wheezing or shortness of breath.   azelastine (ASTELIN) 0.1 % nasal spray Place 2 sprays into both nostrils 2 (two) times daily as needed for rhinitis. Use in each nostril as directed   EPINEPHrine (EPIPEN 2-PAK) 0.3 mg/0.3 mL IJ SOAJ injection Inject 0.3 mLs (0.3 mg total) into the muscle once for 1 dose.   No facility-administered encounter medications on file as of 07/27/2022.    Allergies (verified) Cephalexin, Misoprostol, Naproxen, Penicillins, Tylenol [acetaminophen], Armoracia rusticana ext (horseradish), Morphine and related, Nickel, Pepcid [famotidine], Shrimp [shellfish allergy], Tape, Tetanus toxoids, Wheat, and Gabapentin   History: Past Medical History:  Diagnosis Date   Allergy    Anxiety    Arthritis    Asthma    Cataract    surgery both eyes in 2011   Chronic fatigue    Depression    Depression screen    Diffuse cystic mastopathy    Fibromyalgia    Fibromyalgia muscle pain    GERD (gastroesophageal reflux disease)    Heart murmur  History of kidney stones    Hyperlipidemia 11/21/2019   Hypertension    driven by stress and pain   Stage 3a chronic kidney disease 01/27/2021   Past Surgical History:  Procedure Laterality Date   ABDOMINAL HYSTERECTOMY     BACK SURGERY     Spinal Fusion   BREAST BIOPSY Right    cataract     CHOLECYSTECTOMY     COLONOSCOPY  2006   EYE SURGERY Bilateral 2011   cataract   FRACTURE SURGERY  2018   left wrist   NASAL SINUS SURGERY     NASAL SINUS SURGERY     OPEN REDUCTION INTERNAL FIXATION (ORIF) DISTAL RADIAL FRACTURE Left 06/05/2016    Procedure: OPEN REDUCTION INTERNAL FIXATION (ORIF) DISTAL RADIAL FRACTURE;  Surgeon: Deeann Saint, MD;  Location: [facility] ORS;  Service: Orthopedics;  Laterality: Left;  Hand Innovations needed   SPINE SURGERY  2003, 2012   TYMPANOSTOMY TUBE PLACEMENT     Family History  Problem Relation Age of Onset   Heart disease Mother    Arthritis Mother    Vision loss Mother    Heart disease Father    Stroke Father    Vascular Disease Sister    Heart disease Brother    Colon cancer Neg Hx    Breast cancer Neg Hx    Social History   Socioeconomic History   Marital status: Single    Spouse name: Not on file   Number of children: 0   Years of education: Not on file   Highest education level: Some college, no degree  Occupational History   Occupation: Disability  Tobacco Use   Smoking status: Every Day    Packs/day: 0.50    Years: 40.00    Additional pack years: 0.00    Total pack years: 20.00    Types: Cigarettes   Smokeless tobacco: Never  Vaping Use   Vaping Use: Never used  Substance and Sexual Activity   Alcohol use: Not Currently   Drug use: Never   Sexual activity: Not Currently    Birth control/protection: None  Other Topics Concern   Not on file  Social History Narrative   Difficulty sitting, standing for prolong periods of time.    Social Determinants of Health   Financial Resource Strain: Low Risk  (07/27/2022)   Overall Financial Resource Strain (CARDIA)    Difficulty of Paying Living Expenses: Not very hard  Food Insecurity: No Food Insecurity (07/27/2022)   Hunger Vital Sign    Worried About Running Out of Food in the Last Year: Never true    Ran Out of Food in the Last Year: Never true  Transportation Needs: No Transportation Needs (07/27/2022)   PRAPARE - Administrator, Civil Service (Medical): No    Lack of Transportation (Non-Medical): No  Physical Activity: Insufficiently Active (07/27/2022)   Exercise Vital Sign    Days of Exercise per Week: 7  days    Minutes of Exercise per Session: 20 min  Stress: No Stress Concern Present (07/27/2022)   [employer] of Occupational Health - Occupational Stress Questionnaire    Feeling of Stress : Only a little  Social Connections: Socially Isolated (07/27/2022)   Social Connection and Isolation Panel [NHANES]    Frequency of Communication with Friends and Family: More than three times a week    Frequency of Social Gatherings with Friends and Family: Never    Attends Religious Services: Never    Production manager of [employer]  or Organizations: No    Attends Banker Meetings: Never    Marital Status: Never married    Tobacco Counseling Ready to quit: Not Answered Counseling given: Not Answered   Clinical Intake:  Pre-visit preparation completed: Yes  Pain : 0-10 Pain Score: 4  Pain Location: Hip Pain Orientation: Left Pain Radiating Towards: BACK Pain Relieving Factors: INJECTIONS  Pain Relieving Factors: INJECTIONS  Nutritional Risks: None Diabetes: No  How often do you need to have someone help you when you read instructions, pamphlets, or other written materials from your doctor or pharmacy?: 1 - Never  Diabetic?NO  Interpreter Needed?: No  Information entered by :: Kennedy Bucker, LPN   Activities of Daily Living    07/27/2022    1:45 PM 06/12/2022    9:29 AM  In your present state of health, do you have any difficulty performing the following activities:  Hearing? 1 1  Vision? 0 0  Difficulty concentrating or making decisions? 0 0  Walking or climbing stairs? 1 1  Dressing or bathing? 0 1  Doing errands, shopping? 1 1  Preparing Food and eating ? N   Using the Toilet? N   In the past six months, have you accidently leaked urine? N   Do you have problems with loss of bowel control? N   Managing your Medications? N   Managing your Finances? N   Housekeeping or managing your Housekeeping? Y     Patient Care Team: Lorre Munroe, NP as PCP - General  (Internal Medicine) Odette Fraction, MD (Inactive) as Consulting Physician (Anesthesiology)  Indicate any recent Medical Services you may have received from other than [facility] providers in the past year (date may be approximate).     Assessment:   This is a routine wellness examination for Leonor.  Hearing/Vision screen Hearing Screening - Comments:: NO AIDS Vision Screening - Comments:: [facility]- DR.DINGELDEIN  Dietary issues and exercise activities discussed: Current Exercise Habits: The patient does not participate in regular exercise at present   Goals Addressed             This Visit's Progress    DIET - EAT MORE FRUITS AND VEGETABLES         Depression Screen    07/27/2022    1:38 PM 06/12/2022    9:28 AM 03/29/2022    8:29 AM 12/20/2021   10:31 AM 07/28/2021    8:45 AM 06/13/2021    2:02 PM 02/10/2021    1:46 PM  PHQ 2/9 Scores  PHQ - 2 Score 1 4 0 0 0 0 0  PHQ- 9 Score 3 8 1 2 3 6 1     Fall Risk    07/27/2022    1:43 PM 06/12/2022    9:29 AM 03/29/2022    8:30 AM 12/20/2021   10:30 AM 07/28/2021    8:45 AM  Fall Risk   Falls in the past year? 1 1 0 0 1  Number falls in past yr: 1 1  0 1  Injury with Fall? 0 0 0 0 0  Risk for fall due to : History of fall(s);Impaired vision;Impaired balance/gait Impaired balance/gait  History of fall(s) History of fall(s)  Follow up Falls prevention discussed;Falls evaluation completed   Falls evaluation completed Falls evaluation completed    FALL RISK PREVENTION PERTAINING TO THE HOME:  Any stairs in or around the home? Yes  If so, are there any without handrails? No  Home free of loose throw rugs  in walkways, pet beds, electrical cords, etc? Yes  Adequate lighting in your home to reduce risk of falls? Yes   ASSISTIVE DEVICES UTILIZED TO PREVENT FALLS:  Life alert? No  Use of a cane, walker or w/c? Yes - WALKER, W/C IF OUT Grab bars in the bathroom? Yes  Shower chair or bench in shower? Yes  Elevated toilet seat or a  handicapped toilet? Yes    Cognitive Function:        07/27/2022    1:52 PM 06/13/2021    2:04 PM 06/01/2020    9:16 AM 05/21/2018    8:46 AM 02/13/2017    1:48 PM  6CIT Screen  What Year? 0 points 0 points 0 points 0 points 0 points  What month? 0 points 0 points 0 points 0 points 0 points  What time? 0 points 0 points 0 points 0 points 0 points  Count back from 20 0 points 0 points 0 points 0 points 0 points  Months in reverse 0 points 0 points 0 points 0 points 0 points  Repeat phrase 0 points 0 points 0 points 0 points 0 points  Total Score 0 points 0 points 0 points 0 points 0 points    Immunizations Immunization History  Administered Date(s) Administered   Fluad Quad(high Dose 65+) 01/27/2021, 01/30/2022   Influenza Split 02/16/2011, 02/06/2012   Influenza Whole 04/10/2009   Influenza,inj,Quad PF,6+ Mos 03/26/2017, 01/30/2018, 01/15/2019   Influenza-Unspecified 03/03/2020   PFIZER Comirnaty(Gray Top)Covid-19 Tri-Sucrose Vaccine 07/19/2020   PFIZER(Purple Top)SARS-COV-2 Vaccination 05/14/2019, 06/04/2019, 01/09/2020   PNEUMOCOCCAL CONJUGATE-20 07/28/2021   Pfizer Covid Bivalent Pediatric Vaccine(59mos to <48yrs) 02/25/2021   Pneumococcal Polysaccharide-23 04/10/2008    TDAP status: Due, Education has been provided regarding the importance of this vaccine. Advised may receive this vaccine at local pharmacy or Health Dept. Aware to provide a copy of the vaccination record if obtained from local pharmacy or Health Dept. Verbalized acceptance and understanding.  Flu Vaccine status: Up to date  Pneumococcal vaccine status: Up to date  Covid-19 vaccine status: Completed vaccines  Qualifies for Shingles Vaccine? Yes   Zostavax completed No   Shingrix Completed?: No.    Education has been provided regarding the importance of this vaccine. Patient has been advised to call insurance company to determine out of pocket expense if they have not yet received this vaccine. Advised may  also receive vaccine at local pharmacy or Health Dept. Verbalized acceptance and understanding.  Screening Tests Health Maintenance  Topic Date Due   DTaP/Tdap/Td (1 - Tdap) Never done   Lung Cancer Screening  Never done   Zoster Vaccines- Shingrix (1 of 2) Never done   COVID-19 Vaccine (6 - 2023-24 season) 12/09/2021   INFLUENZA VACCINE  11/09/2022   Fecal DNA (Cologuard)  02/09/2023   MAMMOGRAM  06/13/2023   Medicare Annual Wellness (AWV)  07/27/2023   Pneumonia Vaccine 46+ Years old  Completed   DEXA SCAN  Completed   Hepatitis C Screening  Completed   HPV VACCINES  Aged Out    Health Maintenance  Health Maintenance Due  Topic Date Due   DTaP/Tdap/Td (1 - Tdap) Never done   Lung Cancer Screening  Never done   Zoster Vaccines- Shingrix (1 of 2) Never done   COVID-19 Vaccine (6 - 2023-24 season) 12/09/2021    Colorectal cancer screening: Type of screening: Cologuard. Completed 02/09/20. Repeat every 3 years- DECLINED TO HAVE ANOTHER ONE OR COLONOSCOPY  Mammogram status: Completed 06/13/22. Repeat every year  Bone Density status: Completed 06/13/22. Results reflect: Bone density results: OSTEOPOROSIS. Repeat every 2 years.  Lung Cancer Screening: (Low Dose CT Chest recommended if Age 24-80 years, 30 pack-year currently smoking OR have quit w/in 15years.) does qualify.   Lung Cancer Screening Referral: DECLINED  Additional Screening:  Hepatitis C Screening: does qualify; Completed 02/09/84  Vision Screening: Recommended annual ophthalmology exams for early detection of glaucoma and other disorders of the eye. Is the patient up to date with their annual eye exam?  Yes  Who is the provider or what is the name of the office in which the patient attends annual eye exams? DR.DINGELDEIN If pt is not established with a provider, would they like to be referred to a provider to establish care? No .   Dental Screening: Recommended annual dental exams for proper oral  hygiene  Community Resource Referral / Chronic Care Management: CRR required this visit?  No   CCM required this visit?  No      Plan:     I have personally reviewed and noted the following in the patient's chart:   Medical and social history Use of alcohol, tobacco or illicit drugs  Current medications and supplements including opioid prescriptions. Patient is not currently taking opioid prescriptions. Functional ability and status Nutritional status Physical activity Advanced directives List of other physicians Hospitalizations, surgeries, and ER visits in previous 12 months Vitals Screenings to include cognitive, depression, and falls Referrals and appointments  In addition, I have reviewed and discussed with patient certain preventive protocols, quality metrics, and best practice recommendations. A written personalized care plan for preventive services as well as general preventive health recommendations were provided to patient.     Hal Hope, LPN   [id number]   Nurse Notes: [facility]

## 2022-07-28 DIAGNOSIS — N179 Acute kidney failure, unspecified: Secondary | ICD-10-CM | POA: Diagnosis not present

## 2022-07-31 DIAGNOSIS — K219 Gastro-esophageal reflux disease without esophagitis: Secondary | ICD-10-CM | POA: Diagnosis not present

## 2022-07-31 DIAGNOSIS — Z9181 History of falling: Secondary | ICD-10-CM | POA: Diagnosis not present

## 2022-07-31 DIAGNOSIS — G894 Chronic pain syndrome: Secondary | ICD-10-CM | POA: Diagnosis not present

## 2022-07-31 DIAGNOSIS — J45909 Unspecified asthma, uncomplicated: Secondary | ICD-10-CM | POA: Diagnosis not present

## 2022-07-31 DIAGNOSIS — R339 Retention of urine, unspecified: Secondary | ICD-10-CM | POA: Diagnosis not present

## 2022-07-31 DIAGNOSIS — N179 Acute kidney failure, unspecified: Secondary | ICD-10-CM | POA: Diagnosis not present

## 2022-07-31 DIAGNOSIS — F17210 Nicotine dependence, cigarettes, uncomplicated: Secondary | ICD-10-CM | POA: Diagnosis not present

## 2022-07-31 DIAGNOSIS — Z79899 Other long term (current) drug therapy: Secondary | ICD-10-CM | POA: Diagnosis not present

## 2022-07-31 DIAGNOSIS — F32A Depression, unspecified: Secondary | ICD-10-CM | POA: Diagnosis not present

## 2022-07-31 DIAGNOSIS — M1990 Unspecified osteoarthritis, unspecified site: Secondary | ICD-10-CM | POA: Diagnosis not present

## 2022-07-31 DIAGNOSIS — N1832 Chronic kidney disease, stage 3b: Secondary | ICD-10-CM | POA: Diagnosis not present

## 2022-07-31 DIAGNOSIS — Z79891 Long term (current) use of opiate analgesic: Secondary | ICD-10-CM | POA: Diagnosis not present

## 2022-07-31 DIAGNOSIS — E785 Hyperlipidemia, unspecified: Secondary | ICD-10-CM | POA: Diagnosis not present

## 2022-07-31 DIAGNOSIS — F419 Anxiety disorder, unspecified: Secondary | ICD-10-CM | POA: Diagnosis not present

## 2022-07-31 DIAGNOSIS — M797 Fibromyalgia: Secondary | ICD-10-CM | POA: Diagnosis not present

## 2022-07-31 DIAGNOSIS — I129 Hypertensive chronic kidney disease with stage 1 through stage 4 chronic kidney disease, or unspecified chronic kidney disease: Secondary | ICD-10-CM | POA: Diagnosis not present

## 2022-08-02 ENCOUNTER — Ambulatory Visit: Payer: PPO | Admitting: Internal Medicine

## 2022-08-02 DIAGNOSIS — Z961 Presence of intraocular lens: Secondary | ICD-10-CM | POA: Diagnosis not present

## 2022-08-02 DIAGNOSIS — H353131 Nonexudative age-related macular degeneration, bilateral, early dry stage: Secondary | ICD-10-CM | POA: Diagnosis not present

## 2022-08-04 ENCOUNTER — Telehealth: Payer: Self-pay | Admitting: Pharmacist

## 2022-08-04 NOTE — Progress Notes (Signed)
   08/04/2022  Patient ID: April Price, female   DOB: 07-10-1955, 67 y.o.   MRN: [id number]   Receive message from [facility] advising she sent patient a MyChart message on 07/24/2022 as noted patient due for refill of lisinopril. Requests PCP consider sending future lisinopril prescription for 90 day supply.  From review of dispensing history in chart, note patient refilled lisinopril 5 mg on 07/24/2022 (30 day supply). Note patient has upcoming appointment with PCP on 08/07/2022.  Plan:  Will route note to PCP to request provider consider renewing lisinopril prescription for 90 day supply both to aid with medication adherence and for cost savings to patient (patient with [employer] coverage).  Estelle Grumbles, PharmD, [facility] Clinical Pharmacist [facility] [phone]

## 2022-08-07 ENCOUNTER — Encounter: Payer: Self-pay | Admitting: Internal Medicine

## 2022-08-07 ENCOUNTER — Ambulatory Visit (INDEPENDENT_AMBULATORY_CARE_PROVIDER_SITE_OTHER): Payer: PPO | Admitting: Internal Medicine

## 2022-08-07 VITALS — BP 136/72 | HR 100 | Temp 96.8°F | Wt 152.0 lb

## 2022-08-07 DIAGNOSIS — F1120 Opioid dependence, uncomplicated: Secondary | ICD-10-CM | POA: Diagnosis not present

## 2022-08-07 DIAGNOSIS — E782 Mixed hyperlipidemia: Secondary | ICD-10-CM | POA: Diagnosis not present

## 2022-08-07 DIAGNOSIS — Z0001 Encounter for general adult medical examination with abnormal findings: Secondary | ICD-10-CM

## 2022-08-07 DIAGNOSIS — E663 Overweight: Secondary | ICD-10-CM

## 2022-08-07 DIAGNOSIS — Z6826 Body mass index (BMI) 26.0-26.9, adult: Secondary | ICD-10-CM

## 2022-08-07 DIAGNOSIS — M461 Sacroiliitis, not elsewhere classified: Secondary | ICD-10-CM | POA: Diagnosis not present

## 2022-08-07 DIAGNOSIS — R7309 Other abnormal glucose: Secondary | ICD-10-CM | POA: Diagnosis not present

## 2022-08-07 DIAGNOSIS — M5136 Other intervertebral disc degeneration, lumbar region: Secondary | ICD-10-CM | POA: Diagnosis not present

## 2022-08-07 DIAGNOSIS — M5416 Radiculopathy, lumbar region: Secondary | ICD-10-CM | POA: Diagnosis not present

## 2022-08-07 MED ORDER — ESOMEPRAZOLE MAGNESIUM 40 MG PO CPDR: DELAYED_RELEASE_CAPSULE | ORAL | 1 refills | Status: DC

## 2022-08-07 MED ORDER — FLUTICASONE PROPIONATE 50 MCG/ACT NA SUSP: 2.0000 | Freq: Every day | NASAL | 6 refills | Status: AC

## 2022-08-07 MED ORDER — LISINOPRIL 5 MG PO TABS: 5.0000 mg | ORAL_TABLET | Freq: Every day | ORAL | 1 refills | Status: DC

## 2022-08-07 MED ORDER — AZELASTINE HCL 0.1 % NA SOLN: 2.0000 | Freq: Two times a day (BID) | NASAL | 5 refills | Status: AC | PRN

## 2022-08-07 NOTE — Progress Notes (Signed)
Subjective:    Patient ID: April Price, female    DOB: 09-11-1955, 67 y.o.   MRN: [id number]  HPI  Patient presents to clinic today for annual exam.  Flu: 01/2022 Tetanus: Unsure COVID: Pfizer x 5 Pneumovax: 04/2008 Prevnar 20: 07/2021 Shingrix: Had it but unsure of date Pap smear: Hysterectomy Mammogram: 06/2022 Bone density: 06/2022 Colon screening: 02/2020, Cologuard Vision screening: annually Dentist: as needed  Diet: She does eat some meat. She consumes fruits and veggies. She tries to avoid fried foods. She drinks mostly water, juice Exercise: Walking  Review of Systems     Past Medical History:  Diagnosis Date   Allergy    Anxiety    Arthritis    Asthma    Cataract    surgery both eyes in 2011   Chronic fatigue    Depression    Depression screen    Diffuse cystic mastopathy    Fibromyalgia    Fibromyalgia muscle pain    GERD (gastroesophageal reflux disease)    Heart murmur    History of kidney stones    Hyperlipidemia 11/21/2019   Hypertension    driven by stress and pain   Stage 3a chronic kidney disease (HCC) 01/27/2021    Current Outpatient Medications  Medication Sig Dispense Refill   albuterol (PROVENTIL HFA;VENTOLIN HFA) 108 (90 Base) MCG/ACT inhaler Inhale 2 puffs into the lungs every 4 (four) hours as needed for wheezing or shortness of breath. 3 Inhaler 1   azelastine (ASTELIN) 0.1 % nasal spray Place 2 sprays into both nostrils 2 (two) times daily as needed for rhinitis. Use in each nostril as directed 30 mL 5   cyclobenzaprine (FLEXERIL) 5 MG tablet Take 1 tablet (5 mg total) by mouth 2 (two) times daily as needed for muscle spasms. 15 tablet 0   EPINEPHrine (EPIPEN 2-PAK) 0.3 mg/0.3 mL IJ SOAJ injection Inject 0.3 mLs (0.3 mg total) into the muscle once for 1 dose. 0.3 mL 0   esomeprazole (NEXIUM) 40 MG capsule TAKE 1 CAPSULE BY MOUTH EVERY DAY 90 capsule 1   fluticasone (FLONASE) 50 MCG/ACT nasal spray Place 2 sprays into both  nostrils daily. 16 g 6   lisinopril (ZESTRIL) 5 MG tablet Take 1 tablet (5 mg total) by mouth daily. 30 tablet 1   montelukast (SINGULAIR) 10 MG tablet Take 10 mg by mouth at bedtime.     Multiple Vitamins-Minerals (PRESERVISION AREDS 2 PO) Take 1 capsule by mouth 2 (two) times daily.     traMADol (ULTRAM) 50 MG tablet Take 50 mg by mouth every 8 (eight) hours as needed.     No current facility-administered medications for this visit.    Allergies  Allergen Reactions   Cephalexin Anaphylaxis    Unspecified reaction per patient chart- she was started on Bactrim instead after 1-2 doses of Keflex   Patient says she had anaphylactic rxn  Unspecified reaction per patient chart- she was started on Bactrim instead after 1-2 doses of Keflex   Patient says she had anaphylactic rxn   Misoprostol Hives and Itching   Naproxen Hives and Other (See Comments)    Other Reaction: itch   Penicillins Anaphylaxis    Has patient had a PCN reaction causing immediate rash, facial/tongue/throat swelling, SOB or lightheadedness with hypotension: Yes Has patient had a PCN reaction causing severe rash involving mucus membranes or skin necrosis: No Has patient had a PCN reaction that required hospitalization No Has patient had a PCN reaction occurring within the  last 10 years: Yes If all of the above answers are "NO", then may proceed with Cephalosporin use.    Tylenol [Acetaminophen] Anaphylaxis, Itching and Hives    Ears and throat itch    Armoracia Rusticana Ext (Horseradish)    Morphine And Related Other (See Comments)    Depression    Nickel Itching   Pepcid [Famotidine] Nausea And Vomiting   Shrimp [Shellfish Allergy] Hives   Tape     irritation   Tetanus Toxoids Other (See Comments)    Swelling at injection site and fever   Wheat    Gabapentin Other (See Comments)    Makes her feel "spacy" off balance, like she is coming out of anesthesia.     Family History  Problem Relation Age of Onset    Heart disease Mother    Arthritis Mother    Vision loss Mother    Heart disease Father    Stroke Father    Vascular Disease Sister    Heart disease Brother    Colon cancer Neg Hx    Breast cancer Neg Hx     Social History   Socioeconomic History   Marital status: Single    Spouse name: Not on file   Number of children: 0   Years of education: Not on file   Highest education level: Associate degree: occupational, Scientist, product/process development, or vocational program  Occupational History   Occupation: Disability  Tobacco Use   Smoking status: Every Day    Packs/day: 0.50    Years: 40.00    Additional pack years: 0.00    Total pack years: 20.00    Types: Cigarettes   Smokeless tobacco: Never  Vaping Use   Vaping Use: Never used  Substance and Sexual Activity   Alcohol use: Not Currently   Drug use: Never   Sexual activity: Not Currently    Birth control/protection: None  Other Topics Concern   Not on file  Social History Narrative   Difficulty sitting, standing for prolong periods of time.    Social Determinants of Health   Financial Resource Strain: High Risk (08/05/2022)   Overall Financial Resource Strain (CARDIA)    Difficulty of Paying Living Expenses: Hard  Food Insecurity: No Food Insecurity (08/05/2022)   Hunger Vital Sign    Worried About Running Out of Food in the Last Year: Never true    Ran Out of Food in the Last Year: Never true  Transportation Needs: Unmet Transportation Needs (08/05/2022)   PRAPARE - Transportation    Lack of Transportation (Medical): Yes    Lack of Transportation (Non-Medical): Yes  Physical Activity: Inactive (08/05/2022)   Exercise Vital Sign    Days of Exercise per Week: 0 days    Minutes of Exercise per Session: 20 min  Stress: No Stress Concern Present (08/05/2022)   [employer] of Occupational Health - Occupational Stress Questionnaire    Feeling of Stress : Only a little  Social Connections: Moderately Integrated (08/05/2022)   Social  Connection and Isolation Panel [NHANES]    Frequency of Communication with Friends and Family: More than three times a week    Frequency of Social Gatherings with Friends and Family: Once a week    Attends Religious Services: 1 to 4 times per year    Active Member of [employer] or Organizations: Yes    Attends Banker Meetings: 1 to 4 times per year    Marital Status: Never married  Recent Concern: Social Connections - Socially Isolated (  07/27/2022)   Social Connection and Isolation Panel [NHANES]    Frequency of Communication with Friends and Family: More than three times a week    Frequency of Social Gatherings with Friends and Family: Never    Attends Religious Services: Never    Database administrator or Organizations: No    Attends Banker Meetings: Never    Marital Status: Never married  Intimate Partner Violence: Not At Risk (07/27/2022)   Humiliation, Afraid, Rape, and Kick questionnaire    Fear of Current or Ex-Partner: No    Emotionally Abused: No    Physically Abused: No    Sexually Abused: No     Constitutional: Patient reports fatigue.  Denies fever, malaise, headache or abrupt weight changes.  HEENT: Denies eye pain, eye redness, ear pain, ringing in the ears, wax buildup, runny nose, nasal congestion, bloody nose, or sore throat. Respiratory: Denies difficulty breathing, shortness of breath, cough or sputum production.   Cardiovascular: Denies chest pain, chest tightness, palpitations or swelling in the hands or feet.  Gastrointestinal: Denies abdominal pain, bloating, constipation, diarrhea or blood in the stool.  GU: Denies urgency, frequency, pain with urination, burning sensation, blood in urine, odor or discharge. Musculoskeletal: Patient reports chronic joint and muscle pain.  Denies decrease in range of motion, difficulty with gait, or joint swelling.  Skin: Denies redness, rashes, lesions or ulcercations.  Neurological: Denies dizziness,  difficulty with memory, difficulty with speech or problems with balance and coordination.  Psych: Patient has a history of depression.  Denies anxiety, SI/HI.  No other specific complaints in a complete review of systems (except as listed in HPI above).   Objective:   Physical Exam   BP 136/72 (BP Location: Left Arm, Patient Position: Sitting, Cuff Size: Normal)   Pulse 100   Temp (!) 96.8 F (36 C) (Temporal)   Wt 152 lb (68.9 kg) Comment: Per pt  SpO2 97%   BMI 26.93 kg/m   Wt Readings from Last 3 Encounters:  07/27/22 155 lb (70.3 kg)  06/02/22 159 lb 9.8 oz (72.4 kg)  01/30/22 155 lb (70.3 kg)    General: Appears her stated age, overweight, in NAD. Skin: Warm, dry and intact.  Senile purpura noted. HEENT: Head: normal shape and size; Eyes: sclera white, no icterus, conjunctiva pink, PERRLA and EOMs intact;  Cardiovascular: Normal rate and rhythm. S1,S2 noted.  No murmur, rubs or gallops noted. No JVD or BLE edema. No carotid bruits noted. Pulmonary/Chest: Normal effort and positive vesicular breath sounds. No respiratory distress. No wheezes, rales or ronchi noted.  Abdomen: Normal bowel sounds.  Musculoskeletal: Strength 5/5 BUE.  In wheelchair today. Neurological: Alert and oriented. Cranial nerves II-XII grossly intact. Coordination normal.  Psychiatric: Mood and affect normal. Behavior is normal. Judgment and thought content normal.     BMET    Component Value Date/Time   NA 136 06/04/2022 0441   K 3.6 06/04/2022 0441   CL 113 (H) 06/04/2022 0441   CO2 17 (L) 06/04/2022 0441   GLUCOSE 87 06/04/2022 0441   BUN 56 (H) 06/04/2022 0441   CREATININE 2.18 (H) 06/04/2022 0441   CREATININE 1.65 (H) 05/29/2022 1408   CALCIUM 8.6 (L) 06/04/2022 0441   GFRNONAA 24 (L) 06/04/2022 0441   GFRNONAA 50 (L) 11/21/2019 0829   GFRAA 57 (L) 11/21/2019 0829    Lipid Panel     Component Value Date/Time   CHOL 256 (H) 01/30/2022 0856   TRIG 162 (H) 01/30/2022 1610  HDL 52  01/30/2022 0856   CHOLHDL 4.9 01/30/2022 0856   LDLCALC 173 (H) 01/30/2022 0856    CBC    Component Value Date/Time   WBC 6.9 06/04/2022 0441   RBC 3.95 06/04/2022 0441   HGB 11.9 (L) 06/04/2022 0441   HCT 36.5 06/04/2022 0441   PLT 200 06/04/2022 0441   MCV 92.4 06/04/2022 0441   MCH 30.1 06/04/2022 0441   MCHC 32.6 06/04/2022 0441   RDW 14.4 06/04/2022 0441   LYMPHSABS 618 (L) 05/29/2022 1408   MONOABS 0.6 06/01/2016 0901   EOSABS 0 (L) 05/29/2022 1408   BASOSABS 124 05/29/2022 1408    Hgb A1C Lab Results  Component Value Date   HGBA1C 5.5 01/30/2022           Assessment & Plan:   Preventative Health Maintenance:  Encouraged her to get a flu shot in the fall She declines tetanus for financial reasons, advised her if she gets better To go get this done Prevnar 20 UTD Encouraged her to get her COVID booster Discussed Shingrix vaccine, she will check coverage with her insurance company and schedule visit if she would like to have this done She no longer needs Pap smears Mammogram and bone density UTD She declines to repeat Cologuard at this time Encouraged her to consume a balanced diet and exercise regimen Advised her to see an eye doctor and dentist annually We will check lipid, A1c today.  Recent CBC and c-Met reviewed  RTC in 6 months for follow-up of chronic conditions Nicki Reaper, NP

## 2022-08-07 NOTE — Assessment & Plan Note (Signed)
Encourage diet and exercise for weight loss 

## 2022-08-07 NOTE — Patient Instructions (Signed)
Health Maintenance for Postmenopausal Women Menopause is a normal process in which your ability to get pregnant comes to an end. This process happens slowly over many months or years, usually between the ages of 48 and 55. Menopause is complete when you have missed your menstrual period for 12 months. It is important to talk with your health care provider about some of the most common conditions that affect women after menopause (postmenopausal women). These include heart disease, cancer, and bone loss (osteoporosis). Adopting a healthy lifestyle and getting preventive care can help to promote your health and wellness. The actions you take can also lower your chances of developing some of these common conditions. What are the signs and symptoms of menopause? During menopause, you may have the following symptoms: Hot flashes. These can be moderate or severe. Night sweats. Decrease in sex drive. Mood swings. Headaches. Tiredness (fatigue). Irritability. Memory problems. Problems falling asleep or staying asleep. Talk with your health care provider about treatment options for your symptoms. Do I need hormone replacement therapy? Hormone replacement therapy is effective in treating symptoms that are caused by menopause, such as hot flashes and night sweats. Hormone replacement carries certain risks, especially as you become older. If you are thinking about using estrogen or estrogen with progestin, discuss the benefits and risks with your health care provider. How can I reduce my risk for heart disease and stroke? The risk of heart disease, heart attack, and stroke increases as you age. One of the causes may be a change in the body's hormones during menopause. This can affect how your body uses dietary fats, triglycerides, and cholesterol. Heart attack and stroke are medical emergencies. There are many things that you can do to help prevent heart disease and stroke. Watch your blood pressure High  blood pressure causes heart disease and increases the risk of stroke. This is more likely to develop in people who have high blood pressure readings or are overweight. Have your blood pressure checked: Every 3-5 years if you are 18-39 years of age. Every year if you are 40 years old or older. Eat a healthy diet  Eat a diet that includes plenty of vegetables, fruits, low-fat dairy products, and lean protein. Do not eat a lot of foods that are high in solid fats, added sugars, or sodium. Get regular exercise Get regular exercise. This is one of the most important things you can do for your health. Most adults should: Try to exercise for at least 150 minutes each week. The exercise should increase your heart rate and make you sweat (moderate-intensity exercise). Try to do strengthening exercises at least twice each week. Do these in addition to the moderate-intensity exercise. Spend less time sitting. Even light physical activity can be beneficial. Other tips Work with your health care provider to achieve or maintain a healthy weight. Do not use any products that contain nicotine or tobacco. These products include cigarettes, chewing tobacco, and vaping devices, such as e-cigarettes. If you need help quitting, ask your health care provider. Know your numbers. Ask your health care provider to check your cholesterol and your blood sugar (glucose). Continue to have your blood tested as directed by your health care provider. Do I need screening for cancer? Depending on your health history and family history, you may need to have cancer screenings at different stages of your life. This may include screening for: Breast cancer. Cervical cancer. Lung cancer. Colorectal cancer. What is my risk for osteoporosis? After menopause, you may be   at increased risk for osteoporosis. Osteoporosis is a condition in which bone destruction happens more quickly than new bone creation. To help prevent osteoporosis or  the bone fractures that can happen because of osteoporosis, you may take the following actions: If you are 19-50 years old, get at least 1,000 mg of calcium and at least 600 international units (IU) of vitamin D per day. If you are older than age 50 but younger than age 70, get at least 1,200 mg of calcium and at least 600 international units (IU) of vitamin D per day. If you are older than age 70, get at least 1,200 mg of calcium and at least 800 international units (IU) of vitamin D per day. Smoking and drinking excessive alcohol increase the risk of osteoporosis. Eat foods that are rich in calcium and vitamin D, and do weight-bearing exercises several times each week as directed by your health care provider. How does menopause affect my mental health? Depression may occur at any age, but it is more common as you become older. Common symptoms of depression include: Feeling depressed. Changes in sleep patterns. Changes in appetite or eating patterns. Feeling an overall lack of motivation or enjoyment of activities that you previously enjoyed. Frequent crying spells. Talk with your health care provider if you think that you are experiencing any of these symptoms. General instructions See your health care provider for regular wellness exams and vaccines. This may include: Scheduling regular health, dental, and eye exams. Getting and maintaining your vaccines. These include: Influenza vaccine. Get this vaccine each year before the flu season begins. Pneumonia vaccine. Shingles vaccine. Tetanus, diphtheria, and pertussis (Tdap) booster vaccine. Your health care provider may also recommend other immunizations. Tell your health care provider if you have ever been abused or do not feel safe at home. Summary Menopause is a normal process in which your ability to get pregnant comes to an end. This condition causes hot flashes, night sweats, decreased interest in sex, mood swings, headaches, or lack  of sleep. Treatment for this condition may include hormone replacement therapy. Take actions to keep yourself healthy, including exercising regularly, eating a healthy diet, watching your weight, and checking your blood pressure and blood sugar levels. Get screened for cancer and depression. Make sure that you are up to date with all your vaccines. This information is not intended to replace advice given to you by your health care provider. Make sure you discuss any questions you have with your health care provider. Document Revised: 08/16/2020 Document Reviewed: 08/16/2020 Elsevier Patient Education  2023 Elsevier Inc.  

## 2022-08-08 LAB — LIPID PANEL
Cholesterol: 233 mg/dL — ABNORMAL HIGH (ref <200)
HDL: 76 mg/dL (ref >=50)
LDL Cholesterol (Calc): 136 mg/dL (calc) — ABNORMAL HIGH
Non-HDL Cholesterol (Calc): 157 mg/dL (calc) — ABNORMAL HIGH (ref <130)
Total CHOL/HDL Ratio: 3.1 (calc) (ref <5.0)
Triglycerides: 99 mg/dL (ref <150)

## 2022-08-08 LAB — HEMOGLOBIN A1C
Hgb A1c MFr Bld: 5.6 % of total Hgb (ref <5.7)
Mean Plasma Glucose: 114 mg/dL
eAG (mmol/L): 6.3 mmol/L

## 2022-08-09 MED ORDER — ATORVASTATIN CALCIUM 10 MG PO TABS: 10.0000 mg | ORAL_TABLET | ORAL | 1 refills | Status: DC

## 2022-08-09 NOTE — Addendum Note (Signed)
Addended by: Lorre Munroe on: 08/09/2022 10:58 AM   Modules accepted: Orders

## 2022-08-30 DIAGNOSIS — M461 Sacroiliitis, not elsewhere classified: Secondary | ICD-10-CM | POA: Diagnosis not present

## 2022-09-28 DIAGNOSIS — Z6828 Body mass index (BMI) 28.0-28.9, adult: Secondary | ICD-10-CM | POA: Diagnosis not present

## 2022-09-28 DIAGNOSIS — F1120 Opioid dependence, uncomplicated: Secondary | ICD-10-CM | POA: Diagnosis not present

## 2022-09-28 DIAGNOSIS — M5416 Radiculopathy, lumbar region: Secondary | ICD-10-CM | POA: Diagnosis not present

## 2022-09-28 DIAGNOSIS — M461 Sacroiliitis, not elsewhere classified: Secondary | ICD-10-CM | POA: Diagnosis not present

## 2022-11-13 DIAGNOSIS — R809 Proteinuria, unspecified: Secondary | ICD-10-CM | POA: Diagnosis not present

## 2022-11-13 DIAGNOSIS — D631 Anemia in chronic kidney disease: Secondary | ICD-10-CM | POA: Diagnosis not present

## 2022-11-13 DIAGNOSIS — N1832 Chronic kidney disease, stage 3b: Secondary | ICD-10-CM | POA: Diagnosis not present

## 2022-11-20 DIAGNOSIS — R809 Proteinuria, unspecified: Secondary | ICD-10-CM | POA: Diagnosis not present

## 2022-11-20 DIAGNOSIS — N1832 Chronic kidney disease, stage 3b: Secondary | ICD-10-CM | POA: Diagnosis not present

## 2022-11-20 DIAGNOSIS — N189 Chronic kidney disease, unspecified: Secondary | ICD-10-CM | POA: Diagnosis not present

## 2022-12-01 DIAGNOSIS — M461 Sacroiliitis, not elsewhere classified: Secondary | ICD-10-CM | POA: Diagnosis not present

## 2022-12-29 DIAGNOSIS — F1120 Opioid dependence, uncomplicated: Secondary | ICD-10-CM | POA: Diagnosis not present

## 2022-12-29 DIAGNOSIS — Z6827 Body mass index (BMI) 27.0-27.9, adult: Secondary | ICD-10-CM | POA: Diagnosis not present

## 2022-12-29 DIAGNOSIS — M461 Sacroiliitis, not elsewhere classified: Secondary | ICD-10-CM | POA: Diagnosis not present

## 2022-12-29 DIAGNOSIS — M5416 Radiculopathy, lumbar region: Secondary | ICD-10-CM | POA: Diagnosis not present

## 2023-02-05 DIAGNOSIS — Z961 Presence of intraocular lens: Secondary | ICD-10-CM | POA: Diagnosis not present

## 2023-02-05 DIAGNOSIS — H353131 Nonexudative age-related macular degeneration, bilateral, early dry stage: Secondary | ICD-10-CM | POA: Diagnosis not present

## 2023-02-08 ENCOUNTER — Ambulatory Visit: Payer: PPO | Admitting: Internal Medicine

## 2023-02-09 ENCOUNTER — Ambulatory Visit (INDEPENDENT_AMBULATORY_CARE_PROVIDER_SITE_OTHER): Payer: PPO | Admitting: Internal Medicine

## 2023-02-09 ENCOUNTER — Encounter: Payer: Self-pay | Admitting: Internal Medicine

## 2023-02-09 VITALS — BP 138/78 | Ht 63.0 in | Wt 158.0 lb

## 2023-02-09 DIAGNOSIS — G9332 Myalgic encephalomyelitis/chronic fatigue syndrome: Secondary | ICD-10-CM

## 2023-02-09 DIAGNOSIS — Z23 Encounter for immunization: Secondary | ICD-10-CM | POA: Diagnosis not present

## 2023-02-09 DIAGNOSIS — I10 Essential (primary) hypertension: Secondary | ICD-10-CM

## 2023-02-09 DIAGNOSIS — G894 Chronic pain syndrome: Secondary | ICD-10-CM | POA: Diagnosis not present

## 2023-02-09 DIAGNOSIS — Z6827 Body mass index (BMI) 27.0-27.9, adult: Secondary | ICD-10-CM

## 2023-02-09 DIAGNOSIS — R739 Hyperglycemia, unspecified: Secondary | ICD-10-CM

## 2023-02-09 DIAGNOSIS — F321 Major depressive disorder, single episode, moderate: Secondary | ICD-10-CM

## 2023-02-09 DIAGNOSIS — M810 Age-related osteoporosis without current pathological fracture: Secondary | ICD-10-CM

## 2023-02-09 DIAGNOSIS — N1832 Chronic kidney disease, stage 3b: Secondary | ICD-10-CM

## 2023-02-09 DIAGNOSIS — Z789 Other specified health status: Secondary | ICD-10-CM

## 2023-02-09 DIAGNOSIS — E782 Mixed hyperlipidemia: Secondary | ICD-10-CM | POA: Diagnosis not present

## 2023-02-09 DIAGNOSIS — K219 Gastro-esophageal reflux disease without esophagitis: Secondary | ICD-10-CM | POA: Diagnosis not present

## 2023-02-09 DIAGNOSIS — J410 Simple chronic bronchitis: Secondary | ICD-10-CM | POA: Diagnosis not present

## 2023-02-09 DIAGNOSIS — M797 Fibromyalgia: Secondary | ICD-10-CM

## 2023-02-09 DIAGNOSIS — E663 Overweight: Secondary | ICD-10-CM

## 2023-02-09 MED ORDER — ALENDRONATE SODIUM 70 MG PO TABS: 70.0000 mg | ORAL_TABLET | ORAL | 1 refills | Status: DC

## 2023-02-09 MED ORDER — BREZTRI AEROSPHERE 160-9-4.8 MCG/ACT IN AERO: 2.0000 | INHALATION_SPRAY | Freq: Two times a day (BID) | RESPIRATORY_TRACT | 5 refills | Status: DC

## 2023-02-09 MED ORDER — LISINOPRIL 5 MG PO TABS: 5.0000 mg | ORAL_TABLET | Freq: Every day | ORAL | 1 refills | Status: DC

## 2023-02-09 MED ORDER — ESOMEPRAZOLE MAGNESIUM 40 MG PO CPDR: DELAYED_RELEASE_CAPSULE | ORAL | 1 refills | Status: DC

## 2023-02-09 NOTE — Assessment & Plan Note (Signed)
Encourage smoking cessation Breztri ordered today, sample provided in office

## 2023-02-09 NOTE — Assessment & Plan Note (Signed)
Encouraged diet for weight loss 

## 2023-02-09 NOTE — Assessment & Plan Note (Signed)
Continue tramadol and cyclobenzaprine per pain management

## 2023-02-09 NOTE — Assessment & Plan Note (Signed)
Elevated today Encouraged her to take her lisinopril consistently Reinforced DASH diet C-Met today

## 2023-02-09 NOTE — Assessment & Plan Note (Signed)
Continue tramadol and cyclobenzaprine for pain management

## 2023-02-09 NOTE — Assessment & Plan Note (Signed)
C-Met today Continue lisinopril for renal protection Will have her continue to follow with nephrology

## 2023-02-09 NOTE — Assessment & Plan Note (Signed)
Lipid profile today Consider Repatha injections

## 2023-02-09 NOTE — Assessment & Plan Note (Signed)
Encouraged regular physical activity ?

## 2023-02-09 NOTE — Assessment & Plan Note (Signed)
Continue vitamin D We will restart alendronate 70 mg once weekly Encouraged daily weightbearing exercise

## 2023-02-09 NOTE — Assessment & Plan Note (Signed)
Tried to avoid things trigger reflux Encourage weight loss as this can help reduce reflux symptoms Continue esomeprazole, refilled today

## 2023-02-09 NOTE — Patient Instructions (Signed)

## 2023-02-09 NOTE — Assessment & Plan Note (Signed)
C-Met and lipid profile today She has been statin intolerant to multiple medications Discussed Repatha injections pending lipid profile Encourage low-fat diet

## 2023-02-09 NOTE — Assessment & Plan Note (Signed)
Stable off meds Support offered 

## 2023-02-09 NOTE — Progress Notes (Signed)
Subjective:    Patient ID: April Price, female    DOB: [birth year], 67 y.o.   MRN: [id number]  HPI  Patient presents to clinic today for 83-month follow-up of chronic conditions.  HTN: Her BP today is 142/82.  She is taking lisinopril only as needed.  ECG from 05/2022 reviewed.  GERD: She thinks this is triggered by her medications.  She denies breakthrough on esomeprazole.  There is no upper GI on file.  Anxiety and depression: Chronic however she is not currently taking any medications for this.  She is not currently seeing a therapist.  She denies SI/HI.  Chronic pain/fatigue/fibromyalgia: Managed with tramadol and cyclobenzaprine as prescribed by pain management.  COPD: She denies chronic cough or shortness of breath. She is not using any inhalers. She does continue to smoke. There are no PFT's of file. She does not follow with pulmonology.  HLD: Her last LDL was 136, triglycerides 99, 07/2022.  She has been unable to tolerate atorvastatin, pravastatin and ezetimibe.  She does not consume a low-fat diet.  Osteoporosis: She reports she is not taking alendronate, calcium but is taking vitamin D OTC.  She does not get weightbearing exercise.  Bone density from 06/2022 reviewed.  CKD 3: Her last creatinine was 1.53, GFR 37, 11/2022.  She is on lisinopril for renal protection.  She follows with nephrology.  Review of Systems     Past Medical History:  Diagnosis Date   Allergy    Anxiety    Arthritis    Asthma    Cataract    surgery both eyes in 2011   Chronic fatigue    Depression    Depression screen    Diffuse cystic mastopathy    Fibromyalgia    Fibromyalgia muscle pain    GERD (gastroesophageal reflux disease)    Heart murmur    History of kidney stones    Hyperlipidemia 11/21/2019   Hypertension    driven by stress and pain   Stage 3a chronic kidney disease (HCC) 01/27/2021    Current Outpatient Medications  Medication Sig Dispense Refill   albuterol  (PROVENTIL HFA;VENTOLIN HFA) 108 (90 Base) MCG/ACT inhaler Inhale 2 puffs into the lungs every 4 (four) hours as needed for wheezing or shortness of breath. 3 Inhaler 1   atorvastatin (LIPITOR) 10 MG tablet Take 1 tablet (10 mg total) by mouth 3 (three) times a week. 45 tablet 1   azelastine (ASTELIN) 0.1 % nasal spray Place 2 sprays into both nostrils 2 (two) times daily as needed for rhinitis. Use in each nostril as directed 30 mL 5   cyclobenzaprine (FLEXERIL) 5 MG tablet Take 1 tablet (5 mg total) by mouth 2 (two) times daily as needed for muscle spasms. 15 tablet 0   EPINEPHrine (EPIPEN 2-PAK) 0.3 mg/0.3 mL IJ SOAJ injection Inject 0.3 mLs (0.3 mg total) into the muscle once for 1 dose. 0.3 mL 0   esomeprazole (NEXIUM) 40 MG capsule TAKE 1 CAPSULE BY MOUTH EVERY DAY 90 capsule 1   fluticasone (FLONASE) 50 MCG/ACT nasal spray Place 2 sprays into both nostrils daily. 16 g 6   lisinopril (ZESTRIL) 5 MG tablet Take 1 tablet (5 mg total) by mouth daily. 90 tablet 1   montelukast (SINGULAIR) 10 MG tablet Take 10 mg by mouth at bedtime.     Multiple Vitamins-Minerals (PRESERVISION AREDS 2 PO) Take 1 capsule by mouth 2 (two) times daily.     traMADol (ULTRAM) 50 MG tablet Take 50 mg  by mouth every 8 (eight) hours as needed.     No current facility-administered medications for this visit.    Allergies  Allergen Reactions   Cephalexin Anaphylaxis    Unspecified reaction per patient chart- she was started on Bactrim instead after 1-2 doses of Keflex   Patient says she had anaphylactic rxn  Unspecified reaction per patient chart- she was started on Bactrim instead after 1-2 doses of Keflex   Patient says she had anaphylactic rxn   Misoprostol Hives and Itching   Naproxen Hives and Other (See Comments)    Other Reaction: itch   Penicillins Anaphylaxis    Has patient had a PCN reaction causing immediate rash, facial/tongue/throat swelling, SOB or lightheadedness with hypotension: Yes Has patient  had a PCN reaction causing severe rash involving mucus membranes or skin necrosis: No Has patient had a PCN reaction that required hospitalization No Has patient had a PCN reaction occurring within the last 10 years: Yes If all of the above answers are "NO", then may proceed with Cephalosporin use.    Tylenol [Acetaminophen] Anaphylaxis, Itching and Hives    Ears and throat itch    Armoracia Rusticana Ext (Horseradish)    Morphine And Codeine Other (See Comments)    Depression    Nickel Itching   Pepcid [Famotidine] Nausea And Vomiting   Shrimp [Shellfish Allergy] Hives   Tape     irritation   Tetanus Toxoids Other (See Comments)    Swelling at injection site and fever   Wheat    Gabapentin Other (See Comments)    Makes her feel "spacy" off balance, like she is coming out of anesthesia.     Family History  Problem Relation Age of Onset   Heart disease Mother    Arthritis Mother    Vision loss Mother    Heart disease Father    Stroke Father    Vascular Disease Sister    Heart disease Brother    Colon cancer Neg Hx    Breast cancer Neg Hx     Social History   Socioeconomic History   Marital status: Single    Spouse name: Not on file   Number of children: 0   Years of education: Not on file   Highest education level: Associate degree: occupational, Scientist, product/process development, or vocational program  Occupational History   Occupation: Disability  Tobacco Use   Smoking status: Every Day    Current packs/day: 0.50    Average packs/day: 0.5 packs/day for 40.0 years (20.0 ttl pk-yrs)    Types: Cigarettes   Smokeless tobacco: Never  Vaping Use   Vaping status: Never Used  Substance and Sexual Activity   Alcohol use: Not Currently   Drug use: Never   Sexual activity: Not Currently    Birth control/protection: None  Other Topics Concern   Not on file  Social History Narrative   Difficulty sitting, standing for prolong periods of time.    Social Determinants of Health   Financial  Resource Strain: High Risk (08/05/2022)   Overall Financial Resource Strain (CARDIA)    Difficulty of Paying Living Expenses: Hard  Food Insecurity: No Food Insecurity (08/05/2022)   Hunger Vital Sign    Worried About Running Out of Food in the Last Year: Never true    Ran Out of Food in the Last Year: Never true  Transportation Needs: Unmet Transportation Needs (08/05/2022)   PRAPARE - Transportation    Lack of Transportation (Medical): Yes    Lack of  Transportation (Non-Medical): Yes  Physical Activity: Inactive (08/05/2022)   Exercise Vital Sign    Days of Exercise per Week: 0 days    Minutes of Exercise per Session: 20 min  Stress: No Stress Concern Present (08/05/2022)   [employer] of Occupational Health - Occupational Stress Questionnaire    Feeling of Stress : Only a little  Social Connections: Moderately Integrated (08/05/2022)   Social Connection and Isolation Panel [NHANES]    Frequency of Communication with Friends and Family: More than three times a week    Frequency of Social Gatherings with Friends and Family: Once a week    Attends Religious Services: 1 to 4 times per year    Active Member of [employer] or Organizations: Yes    Attends Banker Meetings: 1 to 4 times per year    Marital Status: Never married  Recent Concern: Social Connections - Socially Isolated (07/27/2022)   Social Connection and Isolation Panel [NHANES]    Frequency of Communication with Friends and Family: More than three times a week    Frequency of Social Gatherings with Friends and Family: Never    Attends Religious Services: Never    Database administrator or Organizations: No    Attends Banker Meetings: Never    Marital Status: Never married  Intimate Partner Violence: Not At Risk (07/27/2022)   Humiliation, Afraid, Rape, and Kick questionnaire    Fear of Current or Ex-Partner: No    Emotionally Abused: No    Physically Abused: No    Sexually Abused: No      Constitutional: Patient reports fatigue.  Denies fever, malaise, headache or abrupt weight changes.  HEENT: Denies eye pain, eye redness, ear pain, ringing in the ears, wax buildup, runny nose, nasal congestion, bloody nose, or sore throat. Respiratory: Denies difficulty breathing, shortness of breath, cough or sputum production.   Cardiovascular: Denies chest pain, chest tightness, palpitations or swelling in the hands or feet.  Gastrointestinal: Denies abdominal pain, bloating, constipation, diarrhea or blood in the stool.  GU: Denies urgency, frequency, pain with urination, burning sensation, blood in urine, odor or discharge. Musculoskeletal: Patient reports chronic joint and muscle pain, difficulty with gait.  Denies decrease in range of motion, or joint swelling.  Skin: Denies redness, rashes, lesions or ulcercations.  Neurological: Denies dizziness, difficulty with memory, difficulty with speech or problems with balance and coordination.  Psych: Patient has a history of anxiety and depression.  Denies SI/HI.  No other specific complaints in a complete review of systems (except as listed in HPI above).  Objective:   Physical Exam   There were no vitals taken for this visit. Wt Readings from Last 3 Encounters:  08/07/22 152 lb (68.9 kg)  07/27/22 155 lb (70.3 kg)  06/02/22 159 lb 9.8 oz (72.4 kg)    General: Appears their stated age, well developed, well nourished in NAD. Skin: Warm, dry and intact. No rashes, lesions or ulcerations noted. HEENT: Head: normal shape and size; Eyes: sclera white, no icterus, conjunctiva pink, PERRLA and EOMs intact; Ears: Tm's gray and intact, normal light reflex; Nose: mucosa pink and moist, septum midline; Throat/Mouth: Teeth present, mucosa pink and moist, no exudate, lesions or ulcerations noted.  Neck:  Neck supple, trachea midline. No masses, lumps or thyromegaly present.  Cardiovascular: Normal rate and rhythm. S1,S2 noted.  No murmur,  rubs or gallops noted. No JVD or BLE edema. No carotid bruits noted. Pulmonary/Chest: Normal effort and positive vesicular breath sounds.  No respiratory distress. No wheezes, rales or ronchi noted.  Abdomen: Soft and nontender. Normal bowel sounds. No distention or masses noted. Liver, spleen and kidneys non palpable. Musculoskeletal: Normal range of motion. No signs of joint swelling. No difficulty with gait.  Neurological: Alert and oriented. Cranial nerves II-XII grossly intact. Coordination normal.  Psychiatric: Mood and affect normal. Behavior is normal. Judgment and thought content normal.     BMET    Component Value Date/Time   NA 136 06/04/2022 0441   K 3.6 06/04/2022 0441   CL 113 (H) 06/04/2022 0441   CO2 17 (L) 06/04/2022 0441   GLUCOSE 87 06/04/2022 0441   BUN 56 (H) 06/04/2022 0441   CREATININE 2.18 (H) 06/04/2022 0441   CREATININE 1.65 (H) 05/29/2022 1408   CALCIUM 8.6 (L) 06/04/2022 0441   GFRNONAA 24 (L) 06/04/2022 0441   GFRNONAA 50 (L) 11/21/2019 0829   GFRAA 57 (L) 11/21/2019 0829    Lipid Panel     Component Value Date/Time   CHOL 233 (H) 08/07/2022 0854   TRIG 99 08/07/2022 0854   HDL 76 08/07/2022 0854   CHOLHDL 3.1 08/07/2022 0854   LDLCALC 136 (H) 08/07/2022 0854    CBC    Component Value Date/Time   WBC 6.9 06/04/2022 0441   RBC 3.95 06/04/2022 0441   HGB 11.9 (L) 06/04/2022 0441   HCT 36.5 06/04/2022 0441   PLT 200 06/04/2022 0441   MCV 92.4 06/04/2022 0441   MCH 30.1 06/04/2022 0441   MCHC 32.6 06/04/2022 0441   RDW 14.4 06/04/2022 0441   LYMPHSABS 618 (L) 05/29/2022 1408   MONOABS 0.6 06/01/2016 0901   EOSABS 0 (L) 05/29/2022 1408   BASOSABS 124 05/29/2022 1408    Hgb A1C Lab Results  Component Value Date   HGBA1C 5.6 08/07/2022           Assessment & Plan:     RTC in 6 months for your annual exam Nicki Reaper, NP

## 2023-02-10 LAB — CBC
HCT: 47.2 % — ABNORMAL HIGH (ref 35.0–45.0)
Hemoglobin: 15.7 g/dL — ABNORMAL HIGH (ref 11.7–15.5)
MCH: 30.3 pg (ref 27.0–33.0)
MCHC: 33.3 g/dL (ref 32.0–36.0)
MCV: 90.9 fL (ref 80.0–100.0)
MPV: 10.1 fL (ref 7.5–12.5)
Platelets: 273 10*3/uL (ref 140–400)
RBC: 5.19 10*6/uL — ABNORMAL HIGH (ref 3.80–5.10)
RDW: 12.8 % (ref 11.0–15.0)
WBC: 9.6 10*3/uL (ref 3.8–10.8)

## 2023-02-10 LAB — COMPLETE METABOLIC PANEL WITH GFR
AG Ratio: 1.4 (calc) (ref 1.0–2.5)
ALT: 14 U/L (ref 6–29)
AST: 13 U/L (ref 10–35)
Albumin: 4.1 g/dL (ref 3.6–5.1)
Alkaline phosphatase (APISO): 84 U/L (ref 37–153)
BUN/Creatinine Ratio: 13 (calc) (ref 6–22)
BUN: 18 mg/dL (ref 7–25)
CO2: 26 mmol/L (ref 20–32)
Calcium: 9.8 mg/dL (ref 8.6–10.4)
Chloride: 102 mmol/L (ref 98–110)
Creat: 1.38 mg/dL — ABNORMAL HIGH (ref 0.50–1.05)
Globulin: 3 g/dL (ref 1.9–3.7)
Glucose, Bld: 92 mg/dL (ref 65–139)
Potassium: 4.9 mmol/L (ref 3.5–5.3)
Sodium: 137 mmol/L (ref 135–146)
Total Bilirubin: 0.4 mg/dL (ref 0.2–1.2)
Total Protein: 7.1 g/dL (ref 6.1–8.1)
eGFR: 42 mL/min/{1.73_m2} — ABNORMAL LOW (ref >=60)

## 2023-02-10 LAB — HEMOGLOBIN A1C
Hgb A1c MFr Bld: 6.1 %{Hb} — ABNORMAL HIGH (ref <5.7)
Mean Plasma Glucose: 128 mg/dL
eAG (mmol/L): 7.1 mmol/L

## 2023-02-10 LAB — LIPID PANEL
Cholesterol: 226 mg/dL — ABNORMAL HIGH (ref <200)
HDL: 61 mg/dL (ref >=50)
LDL Cholesterol (Calc): 143 mg/dL — ABNORMAL HIGH
Non-HDL Cholesterol (Calc): 165 mg/dL — ABNORMAL HIGH (ref <130)
Total CHOL/HDL Ratio: 3.7 (calc) (ref <5.0)
Triglycerides: 105 mg/dL (ref <150)

## 2023-03-05 DIAGNOSIS — M461 Sacroiliitis, not elsewhere classified: Secondary | ICD-10-CM | POA: Diagnosis not present

## 2023-05-01 DIAGNOSIS — N1832 Chronic kidney disease, stage 3b: Secondary | ICD-10-CM | POA: Diagnosis not present

## 2023-05-01 DIAGNOSIS — N189 Chronic kidney disease, unspecified: Secondary | ICD-10-CM | POA: Diagnosis not present

## 2023-05-07 DIAGNOSIS — R809 Proteinuria, unspecified: Secondary | ICD-10-CM | POA: Diagnosis not present

## 2023-05-07 DIAGNOSIS — N1832 Chronic kidney disease, stage 3b: Secondary | ICD-10-CM | POA: Diagnosis not present

## 2023-05-07 DIAGNOSIS — N2581 Secondary hyperparathyroidism of renal origin: Secondary | ICD-10-CM | POA: Diagnosis not present

## 2023-06-05 DIAGNOSIS — M461 Sacroiliitis, not elsewhere classified: Secondary | ICD-10-CM | POA: Diagnosis not present

## 2023-06-18 ENCOUNTER — Other Ambulatory Visit: Payer: Self-pay | Admitting: Internal Medicine

## 2023-06-18 DIAGNOSIS — I10 Essential (primary) hypertension: Secondary | ICD-10-CM

## 2023-06-19 NOTE — Telephone Encounter (Signed)
 Too soon for refill, last refill 02/09/23 for 90 and 1 refill.E-Prescribing Status: Receipt confirmed by pharmacy (02/09/2023  2:27 PM EDT)   Requested Prescriptions  Pending Prescriptions Disp Refills   lisinopril (ZESTRIL) 5 MG tablet [Pharmacy Med Name: LISINOPRIL 5 MG TAB] 90 tablet 1    Sig: TAKE 1 TABLET BY MOUTH ONCE DAILY     Cardiovascular:  ACE Inhibitors Failed - 06/19/2023 12:20 PM      Failed - Cr in normal range and within 180 days    Creat  Date Value Ref Range Status  02/09/2023 1.38 (H) 0.50 - 1.05 mg/dL Final         Passed - K in normal range and within 180 days    Potassium  Date Value Ref Range Status  02/09/2023 4.9 3.5 - 5.3 mmol/L Final         Passed - Patient is not pregnant      Passed - Last BP in normal range    BP Readings from Last 1 Encounters:  02/09/23 138/78         Passed - Valid encounter within last 6 months    Recent Outpatient Visits           4 months ago Needs flu shot   [facility] [place], Salvadore Oxford, NP   10 months ago Encounter for general adult medical examination with abnormal findings   [place] [facility] [place], Salvadore Oxford, NP   1 year ago Generalized weakness   [place] [facility] [place], Salvadore Oxford, NP   1 year ago SOB (shortness of breath)   [place] [facility] [place], Oswaldo Con[place], [place]   1 year ago Chronic lumbar radicular pain (Left)   [place] [facility] [place], Salvadore Oxford, [place]

## 2023-07-10 DIAGNOSIS — M5416 Radiculopathy, lumbar region: Secondary | ICD-10-CM | POA: Diagnosis not present

## 2023-07-10 DIAGNOSIS — F1120 Opioid dependence, uncomplicated: Secondary | ICD-10-CM | POA: Diagnosis not present

## 2023-07-10 DIAGNOSIS — M461 Sacroiliitis, not elsewhere classified: Secondary | ICD-10-CM | POA: Diagnosis not present

## 2023-08-02 ENCOUNTER — Ambulatory Visit: Payer: PPO

## 2023-08-02 VITALS — Ht 64.0 in | Wt 158.0 lb

## 2023-08-02 DIAGNOSIS — Z0000 Encounter for general adult medical examination without abnormal findings: Secondary | ICD-10-CM

## 2023-08-02 DIAGNOSIS — Z1231 Encounter for screening mammogram for malignant neoplasm of breast: Secondary | ICD-10-CM

## 2023-08-02 NOTE — Patient Instructions (Addendum)
 April Price , Thank you for taking time to come for your Medicare Wellness Visit. I appreciate your ongoing commitment to your health goals. Please review the following plan we discussed and let me know if I can assist you in the future.   Referrals/Orders/Follow-Ups/Clinician Recommendations:   This is a list of the screening recommended for you and due dates:  Health Maintenance  Topic Date Due   DTaP/Tdap/Td vaccine (1 - Tdap) Never done   Zoster (Shingles) Vaccine (1 of 2) Never done   Screening for Lung Cancer  Never done   Cologuard (Stool DNA test)  02/09/2023   Mammogram  06/13/2023   Flu Shot  11/09/2023   Medicare Annual Wellness Visit  08/01/2024   Pneumonia Vaccine  Completed   DEXA scan (bone density measurement)  Completed   Hepatitis C Screening  Completed   HPV Vaccine  Aged Out   Meningitis B Vaccine  Aged Out   COVID-19 Vaccine  Discontinued   Opioid Pain Medicine Management Opioids are powerful medicines that are used to treat moderate to severe pain. When used for short periods of time, they can help you to: Sleep better. Do better in physical or occupational therapy. Feel better in the first few days after an injury. Recover from surgery. Opioids should be taken with the supervision of a trained health care provider. They should be taken for the shortest period of time possible. This is because opioids can be addictive, and the longer you take opioids, the greater your risk of addiction. This addiction can also be called opioid use disorder. What are the risks? Using opioid pain medicines for longer than 3 days increases your risk of side effects. Side effects include: Constipation. Nausea and vomiting. Breathing difficulties (respiratory depression). Drowsiness. Confusion. Opioid use disorder. Itching. Taking opioid pain medicine for a long period of time can affect your ability to do daily tasks. It also puts you at risk for: Motor vehicle  crashes. Depression. Suicide. Heart attack. Overdose, which can be life-threatening. What is a pain treatment plan? A pain treatment plan is an agreement between you and your health care provider. Pain is unique to each person, and treatments vary depending on your condition. To manage your pain, you and your health care provider need to work together. To help you do this: Discuss the goals of your treatment, including how much pain you might expect to have and how you will manage the pain. Review the risks and benefits of taking opioid medicines. Remember that a good treatment plan uses more than one approach and minimizes the chance of side effects. Be honest about the amount of medicines you take and about any drug or alcohol use. Get pain medicine prescriptions from only one health care provider. Pain can be managed with many types of alternative treatments. Ask your health care provider to refer you to one or more specialists who can help you manage pain through: Physical or occupational therapy. Counseling (cognitive behavioral therapy). Good nutrition. Biofeedback. Massage. Meditation. Non-opioid medicine. Following a gentle exercise program. How to use opioid pain medicine Taking medicine Take your pain medicine exactly as told by your health care provider. Take it only when you need it. If your pain gets less severe, you may take less than your prescribed dose if your health care provider approves. If you are not having pain, do nottake pain medicine unless your health care provider tells you to take it. If your pain is severe, do nottry to treat it  yourself by taking more pills than instructed on your prescription. Contact your health care provider for help. Write down the times when you take your pain medicine. It is easy to become confused while on pain medicine. Writing the time can help you avoid overdose. Take other over-the-counter or prescription medicines only as told by  your health care provider. Keeping yourself and others safe  While you are taking opioid pain medicine: Do not drive, use machinery, or power tools. Do not sign legal documents. Do not drink alcohol. Do not take sleeping pills. Do not supervise children by yourself. Do not do activities that require climbing or being in high places. Do not go to a lake, river, ocean, spa, or swimming pool. Do not share your pain medicine with anyone. Keep pain medicine in a locked cabinet or in a secure area where pets and children cannot reach it. Stopping your use of opioids If you have been taking opioid medicine for more than a few weeks, you may need to slowly decrease (taper) how much you take until you stop completely. Tapering your use of opioids can decrease your risk of symptoms of withdrawal, such as: Pain and cramping in the abdomen. Nausea. Sweating. Sleepiness. Restlessness. Uncontrollable shaking (tremors). Cravings for the medicine. Do not attempt to taper your use of opioids on your own. Talk with your health care provider about how to do this. Your health care provider may prescribe a step-down schedule based on how much medicine you are taking and how long you have been taking it. Getting rid of leftover pills Do not save any leftover pills. Get rid of leftover pills safely by: Taking the medicine to a prescription take-back program. This is usually offered by the county or law enforcement. Bringing them to a pharmacy that has a drug disposal container. Flushing them down the toilet. Check the label or package insert of your medicine to see whether this is safe to do. Throwing them out in the trash. Check the label or package insert of your medicine to see whether this is safe to do. If it is safe to throw it out, remove the medicine from the original container, put it into a sealable bag or container, and mix it with used coffee grounds, food scraps, dirt, or cat litter before putting  it in the trash. Follow these instructions at home: Activity Do exercises as told by your health care provider. Avoid activities that make your pain worse. Return to your normal activities as told by your health care provider. Ask your health care provider what activities are safe for you. General instructions You may need to take these actions to prevent or treat constipation: Drink enough fluid to keep your urine pale yellow. Take over-the-counter or prescription medicines. Eat foods that are high in fiber, such as beans, whole grains, and fresh fruits and vegetables. Limit foods that are high in fat and processed sugars, such as fried or sweet foods. Keep all follow-up visits. This is important. Where to find support If you have been taking opioids for a long time, you may benefit from receiving support for quitting from a local support group or counselor. Ask your health care provider for a referral to these resources in your area. Where to find more information Centers for Disease Control and Prevention (CDC): FootballExhibition.com.br [place] Food and Drug Administration (FDA): PumpkinSearch.com.ee Get help right away if: You may have taken too much of an opioid (overdosed). Common symptoms of an overdose: Your breathing is slower  or more shallow than normal. You have a very slow heartbeat (pulse). You have slurred speech. You have nausea and vomiting. Your pupils become very small. You have other potential symptoms: You are very confused. You faint or feel like you will faint. You have cold, clammy skin. You have blue lips or fingernails. You have thoughts of harming yourself or harming others. These symptoms may represent a serious problem that is an emergency. Do not wait to see if the symptoms will go away. Get medical help right away. Call your local emergency services (911 in the [place] Do not drive yourself to the hospital.  If you ever feel like you may hurt yourself or others, or have thoughts  about taking your own life, get help right away. Go to your nearest emergency department or: Call your local emergency services (911 in the [place] Call the [facility] (548-602-5157 in the [place] Call a suicide crisis helpline, such as the National Suicide Prevention Lifeline at (437)525-1465 or 988 in the [place] This is open 24 hours a day in the [place] If you're a Veteran: Call 988 and press 1. This is open 24 hours a day. Text the [employer] at (231)753-3366. Summary Opioid medicines can help you manage moderate to severe pain for a short period of time. A pain treatment plan is an agreement between you and your health care provider. Discuss the goals of your treatment, including how much pain you might expect to have and how you will manage the pain. If you think that you or someone else may have taken too much of an opioid, get medical help right away. This information is not intended to replace advice given to you by your health care provider. Make sure you discuss any questions you have with your health care provider. Document Revised: 01/01/2023 Document Reviewed: 07/07/2020 Elsevier Patient Education  2024 [employer] Advanced directives: (Copy Requested) Please bring a copy of your health care power of attorney and living will to the office to be added to your chart at your convenience. You can mail to [facility] [address] [place], [place] [address] or email to [email or web address]  Next Medicare Annual Wellness Visit scheduled for next year: Yes

## 2023-08-02 NOTE — Progress Notes (Signed)
 Subjective:   April Price is a 68 y.o. who presents for a Medicare Wellness preventive visit.  Visit Complete: Virtual I connected with  April Price on 08/02/23 by a audio enabled telemedicine application and verified that I am speaking with the correct person using two identifiers.  Patient Location: Home  Provider Location: Home Office  I discussed the limitations of evaluation and management by telemedicine. The patient expressed understanding and agreed to proceed.  Vital Signs: Because this visit was a virtual/telehealth visit, some criteria may be missing or patient reported. Any vitals not documented were not able to be obtained and vitals that have been documented are patient reported.    Persons Participating in Visit: Patient.  AWV Questionnaire: No: Patient Medicare AWV questionnaire was not completed prior to this visit.  Cardiac Risk Factors include: advanced age (>62men, >32 women);hypertension     Objective:    Today's Vitals   08/02/23 1351  Weight: 158 lb (71.7 kg)  Height: 5\' 4"  (1.626 m)   Body mass index is 27.12 kg/m.     08/02/2023    1:58 PM 07/27/2022    1:43 PM 06/01/2022    2:00 PM 06/01/2022   11:55 AM 06/01/2020    9:08 AM 05/27/2019    8:29 AM 05/21/2018    8:46 AM  Advanced Directives  Does Patient Have a Medical Advance Directive? Yes No No No Yes Yes Yes  Type of Estate agent of [place];Living will    Healthcare Power of [place];Living will Healthcare Power of Attorney Living will;Healthcare Power of Attorney  Copy of Healthcare Power of Attorney in Chart? No - copy requested    No - copy requested  No - copy requested  Would patient like information on creating a medical advance directive?  No - Patient declined No - Patient declined        Current Medications (verified) Outpatient Encounter Medications as of 08/02/2023  Medication Sig   albuterol  (PROVENTIL  HFA;VENTOLIN  HFA) 108 (90 Base)  MCG/ACT inhaler Inhale 2 puffs into the lungs every 4 (four) hours as needed for wheezing or shortness of breath.   alendronate  (FOSAMAX ) 70 MG tablet Take 1 tablet (70 mg total) by mouth every 7 (seven) days. Take with a full glass of water on an empty stomach.   azelastine  (ASTELIN ) 0.1 % nasal spray Place 2 sprays into both nostrils 2 (two) times daily as needed for rhinitis. Use in each nostril as directed   Budeson-Glycopyrrol-Formoterol (BREZTRI  AEROSPHERE) 160-9-4.8 MCG/ACT AERO Inhale 2 puffs into the lungs 2 (two) times daily.   cyclobenzaprine  (FLEXERIL ) 5 MG tablet Take 1 tablet (5 mg total) by mouth 2 (two) times daily as needed for muscle spasms.   EPINEPHrine  (EPIPEN  2-PAK) 0.3 mg/0.3 mL IJ SOAJ injection Inject 0.3 mLs (0.3 mg total) into the muscle once for 1 dose.   esomeprazole  (NEXIUM ) 40 MG capsule TAKE 1 CAPSULE BY MOUTH EVERY DAY   fluticasone  (FLONASE ) 50 MCG/ACT nasal spray Place 2 sprays into both nostrils daily.   lisinopril  (ZESTRIL ) 5 MG tablet Take 1 tablet (5 mg total) by mouth daily.   Multiple Vitamins-Minerals (PRESERVISION AREDS 2 PO) Take 1 capsule by mouth 2 (two) times daily.   traMADol  (ULTRAM ) 50 MG tablet Take 50 mg by mouth every 8 (eight) hours as needed.   No facility-administered encounter medications on file as of 08/02/2023.    Allergies (verified) Cephalexin , Misoprostol, Naproxen, Penicillins, Tylenol [acetaminophen], Armoracia rusticana ext (horseradish), Morphine and codeine,  Nickel, Pepcid [famotidine], Shrimp [shellfish allergy], Tape, Tetanus toxoids, Wheat, and Gabapentin    History: Past Medical History:  Diagnosis Date   Allergy    Anxiety    Arthritis    Asthma    Cataract    surgery both eyes in 2011   Chronic fatigue    Depression    Depression screen    Diffuse cystic mastopathy    Fibromyalgia    Fibromyalgia muscle pain    GERD (gastroesophageal reflux disease)    Heart murmur    History of kidney stones    Hyperlipidemia  11/21/2019   Hypertension    driven by stress and pain   Stage 3a chronic kidney disease (HCC) 01/27/2021   Past Surgical History:  Procedure Laterality Date   ABDOMINAL HYSTERECTOMY     BACK SURGERY     Spinal Fusion   BREAST BIOPSY Right    cataract     CHOLECYSTECTOMY     COLONOSCOPY  2006   EYE SURGERY Bilateral 2011   cataract   FRACTURE SURGERY  2018   left wrist   NASAL SINUS SURGERY     NASAL SINUS SURGERY     OPEN REDUCTION INTERNAL FIXATION (ORIF) DISTAL RADIAL FRACTURE Left 06/05/2016   Procedure: OPEN REDUCTION INTERNAL FIXATION (ORIF) DISTAL RADIAL FRACTURE;  Surgeon: Marlynn Singer, MD;  Location: [facility] ORS;  Service: Orthopedics;  Laterality: Left;  Hand Innovations needed   SPINE SURGERY  2003, 2012   TYMPANOSTOMY TUBE PLACEMENT     Family History  Problem Relation Age of Onset   Heart disease Mother    Arthritis Mother    Vision loss Mother    Heart disease Father    Stroke Father    Vascular Disease Sister    Heart disease Brother    Colon cancer Neg Hx    Breast cancer Neg Hx    Social History   Socioeconomic History   Marital status: Single    Spouse name: Not on file   Number of children: 0   Years of education: Not on file   Highest education level: Associate degree: occupational, Scientist, product/process development, or vocational program  Occupational History   Occupation: Disability  Tobacco Use   Smoking status: Every Day    Current packs/day: 0.50    Average packs/day: 0.5 packs/day for 40.0 years (20.0 ttl pk-yrs)    Types: Cigarettes   Smokeless tobacco: Never  Vaping Use   Vaping status: Never Used  Substance and Sexual Activity   Alcohol use: Not Currently   Drug use: Never   Sexual activity: Not Currently    Birth control/protection: None  Other Topics Concern   Not on file  Social History Narrative   Difficulty sitting, standing for prolong periods of time.    Social Drivers of Corporate investment banker Strain: Low Risk  (08/02/2023)   Overall  Financial Resource Strain (CARDIA)    Difficulty of Paying Living Expenses: Not hard at all  Food Insecurity: No Food Insecurity (08/02/2023)   Hunger Vital Sign    Worried About Running Out of Food in the Last Year: Never true    Ran Out of Food in the Last Year: Never true  Transportation Needs: No Transportation Needs (08/02/2023)   PRAPARE - Administrator, Civil Service (Medical): No    Lack of Transportation (Non-Medical): No  Physical Activity: Inactive (08/02/2023)   Exercise Vital Sign    Days of Exercise per Week: 0 days  Minutes of Exercise per Session: 0 min  Stress: No Stress Concern Present (08/02/2023)   [employer] of Occupational Health - Occupational Stress Questionnaire    Feeling of Stress : Not at all  Social Connections: Socially Isolated (08/02/2023)   Social Connection and Isolation Panel [NHANES]    Frequency of Communication with Friends and Family: More than three times a week    Frequency of Social Gatherings with Friends and Family: More than three times a week    Attends Religious Services: Never    Database administrator or Organizations: No    Attends Engineer, structural: Never    Marital Status: Never married    Tobacco Counseling Ready to quit: No Counseling given: Yes    Clinical Intake:  Pre-visit preparation completed: Yes  Pain : No/denies pain     BMI - recorded: 27.12 Nutritional Status: BMI 25 -29 Overweight Nutritional Risks: None Diabetes: No  Lab Results  Component Value Date   HGBA1C 6.1 (H) 02/09/2023   HGBA1C 5.6 08/07/2022   HGBA1C 5.5 01/30/2022     How often do you need to have someone help you when you read instructions, pamphlets, or other written materials from your doctor or pharmacy?: 1 - Never  Interpreter Needed?: No  Information entered by :: Farris Hong LPN   Activities of Daily Living     08/02/2023    1:57 PM 02/09/2023    2:12 PM  In your present state of health, do  you have any difficulty performing the following activities:  Hearing? 0 0  Vision? 0 0  Difficulty concentrating or making decisions? 0 0  Walking or climbing stairs? 1 1  Comment Uses Walker, Cane and Wheelchair   Dressing or bathing? 0 0  Doing errands, shopping? 0 1  Preparing Food and eating ? N   Using the Toilet? N   In the past six months, have you accidently leaked urine? N   Do you have problems with loss of bowel control? N   Managing your Medications? N   Managing your Finances? N   Housekeeping or managing your Housekeeping? N     Patient Care Team: Carollynn Cirri, NP as PCP - General (Internal Medicine) Gerri Kras, MD (Inactive) as Consulting Physician (Anesthesiology)  Indicate any recent Medical Services you may have received from other than [facility] providers in the past year (date may be approximate).     Assessment:   This is a routine wellness examination for Rafael.  Hearing/Vision screen Hearing Screening - Comments:: Denies hearing difficulties   Vision Screening - Comments:: Wears rx glasses - up to date with routine eye exams with  [facility]   Goals Addressed               This Visit's Progress     Remain active. (pt-stated)         Depression Screen     08/02/2023    1:56 PM 02/09/2023    2:12 PM 08/07/2022    8:54 AM 07/27/2022    1:38 PM 06/12/2022    9:28 AM 03/29/2022    8:29 AM 12/20/2021   10:31 AM  PHQ 2/9 Scores  PHQ - 2 Score 0 0 0 1 4 0 0  PHQ- 9 Score  2 3 3 8 1 2     Fall Risk     08/02/2023    1:58 PM 02/09/2023    2:12 PM 08/07/2022    8:54 AM 07/27/2022  1:43 PM 06/12/2022    9:29 AM  Fall Risk   Falls in the past year? 0 1 1 1 1   Number falls in past yr: 0 1 0 1 1  Injury with Fall? 0 0 0 0 0  Risk for fall due to : No Fall Risks  Impaired mobility History of fall(s);Impaired vision;Impaired balance/gait Impaired balance/gait  Follow up Falls prevention discussed;Falls evaluation completed   Falls prevention  discussed;Falls evaluation completed     MEDICARE RISK AT HOME:  Medicare Risk at Home Any stairs in or around the home?: Yes If so, are there any without handrails?: No Home free of loose throw rugs in walkways, pet beds, electrical cords, etc?: Yes Adequate lighting in your home to reduce risk of falls?: Yes Life alert?: No Use of a cane, walker or w/c?: Yes Grab bars in the bathroom?: Yes Shower chair or bench in shower?: Yes Elevated toilet seat or a handicapped toilet?: Yes  TIMED UP AND GO:  Was the test performed?  No  Cognitive Function: 6CIT completed        08/02/2023    1:59 PM 07/27/2022    1:52 PM 06/13/2021    2:04 PM 06/01/2020    9:16 AM 05/21/2018    8:46 AM  6CIT Screen  What Year? 0 points 0 points 0 points 0 points 0 points  What month? 0 points 0 points 0 points 0 points 0 points  What time? 0 points 0 points 0 points 0 points 0 points  Count back from 20 0 points 0 points 0 points 0 points 0 points  Months in reverse 0 points 0 points 0 points 0 points 0 points  Repeat phrase 0 points 0 points 0 points 0 points 0 points  Total Score 0 points 0 points 0 points 0 points 0 points    Immunizations Immunization History  Administered Date(s) Administered   Fluad Quad(high Dose 65+) 01/27/2021, 01/30/2022   Fluad Trivalent(High Dose 65+) 02/09/2023   Influenza Split 02/16/2011, 02/06/2012   Influenza Whole 04/10/2009   Influenza,inj,Quad PF,6+ Mos 03/26/2017, 01/30/2018, 01/15/2019   Influenza-Unspecified 03/03/2020   PFIZER Comirnaty(Gray Top)Covid-19 Tri-Sucrose Vaccine 07/19/2020   PFIZER(Purple Top)SARS-COV-2 Vaccination 05/14/2019, 06/04/2019, 01/09/2020   PNEUMOCOCCAL CONJUGATE-20 07/28/2021   Pfizer Covid Bivalent Pediatric Vaccine(32mos to <3yrs) 02/25/2021   Pneumococcal Polysaccharide-23 04/10/2008    Screening Tests Health Maintenance  Topic Date Due   DTaP/Tdap/Td (1 - Tdap) Never done   Zoster Vaccines- Shingrix (1 of 2) Never done    Lung Cancer Screening  Never done   Fecal DNA (Cologuard)  02/09/2023   MAMMOGRAM  06/13/2023   INFLUENZA VACCINE  11/09/2023   Medicare Annual Wellness (AWV)  08/01/2024   Pneumonia Vaccine 14+ Years old  Completed   DEXA SCAN  Completed   Hepatitis C Screening  Completed   HPV VACCINES  Aged Out   Meningococcal B Vaccine  Aged Out   COVID-19 Vaccine  Discontinued    Health Maintenance  Health Maintenance Due  Topic Date Due   DTaP/Tdap/Td (1 - Tdap) Never done   Zoster Vaccines- Shingrix (1 of 2) Never done   Lung Cancer Screening  Never done   Fecal DNA (Cologuard)  02/09/2023   MAMMOGRAM  06/13/2023   Health Maintenance Items Addressed: Lung cancer screening and Fecal DNA/Cologuard declined  Additional Screening:  Vision Screening: Recommended annual ophthalmology exams for early detection of glaucoma and other disorders of the eye.  Dental Screening: Recommended annual dental exams for  proper oral hygiene  Community Resource Referral / Chronic Care Management: CRR required this visit?  No   CCM required this visit?  No     Plan:     I have personally reviewed and noted the following in the patient's chart:   Medical and social history Use of alcohol, tobacco or illicit drugs  Current medications and supplements including opioid prescriptions. Patient is currently taking opioid prescriptions. Information provided to patient regarding non-opioid alternatives. Patient advised to discuss non-opioid treatment plan with their provider. Functional ability and status Nutritional status Physical activity Advanced directives List of other physicians Hospitalizations, surgeries, and ER visits in previous 12 months Vitals Screenings to include cognitive, depression, and falls Referrals and appointments  In addition, I have reviewed and discussed with patient certain preventive protocols, quality metrics, and best practice recommendations. A written personalized care  plan for preventive services as well as general preventive health recommendations were provided to patient.     Dewayne Ford, LPN   1/61/0960   After Visit Summary: (MyChart) Due to this being a telephonic visit, the after visit summary with patients personalized plan was offered to patient via MyChart   Notes: Nothing significant to report at this time.

## 2023-08-08 ENCOUNTER — Encounter: Payer: PPO | Admitting: Internal Medicine

## 2023-08-10 ENCOUNTER — Other Ambulatory Visit: Payer: Self-pay | Admitting: Internal Medicine

## 2023-08-10 DIAGNOSIS — K219 Gastro-esophageal reflux disease without esophagitis: Secondary | ICD-10-CM

## 2023-08-13 NOTE — Telephone Encounter (Signed)
 Requested Prescriptions  Pending Prescriptions Disp Refills   esomeprazole  (NEXIUM ) 40 MG capsule [Pharmacy Med Name: ESOMEPRAZOLE  MAGNESIUM  40 MG CAP] 90 capsule 1    Sig: TAKE 1 CAPSULE BY MOUTH ONCE DAILY     Gastroenterology: Proton Pump Inhibitors 2 Passed - 08/13/2023  8:27 PM      Passed - ALT in normal range and within 360 days    ALT  Date Value Ref Range Status  02/09/2023 14 6 - 29 U/L Final         Passed - AST in normal range and within 360 days    AST  Date Value Ref Range Status  02/09/2023 13 10 - 35 U/L Final         Passed - Valid encounter within last 12 months    Recent Outpatient Visits           11 years ago Chronic back pain   [place] [facility], Madge Schiller, MD   11 years ago Dry eyes, bilateral   [place] [facility], Madge Schiller, MD               alendronate  (FOSAMAX ) 70 MG tablet [Pharmacy Med Name: ALENDRONATE  SODIUM 70 MG TAB] 12 tablet 1    Sig: TAKE 1 TABLET EVERY 7 DAYS WITH A FULL GLASS OF WATER ON AN EMPTY STOMACH DO NOT LIE DOWN FOR AT LEAST 30 MIN     Endocrinology:  Bisphosphonates Failed - 08/13/2023  8:27 PM      Failed - Vitamin D  in normal range and within 360 days    25-Hydroxy, Vitamin D -3  Date Value Ref Range Status  02/11/2016 18 ng/mL Final    Comment:    (NOTE) Performed At: [facility] [address] [place], [place] [id number] [address] Rueben Cote MD WU:[id number]    25-Hydroxy, Vitamin D -2  Date Value Ref Range Status  02/11/2016 <1.0 ng/mL Final   25-Hydroxy, Vitamin D   Date Value Ref Range Status  02/11/2016 18 (L) ng/mL Final    Comment:    (NOTE) Reference Range: All Ages: Target levels 30 - 100    Vit D, 25-Hydroxy  Date Value Ref Range Status  01/30/2022 10 (L) 30 - 100 ng/mL Final    Comment:    Vitamin D  Status         25-OH Vitamin D : . Deficiency:                    <20  ng/mL Insufficiency:             20 - 29 ng/mL Optimal:                 > or = 30 ng/mL . For 25-OH Vitamin D  testing on patients on  D2-supplementation and patients for whom quantitation  of D2 and D3 fractions is required, the Quest[employer]ureD(TM) 25-OH VIT D, (D2,D3), LC/MS/MS is recommended: order  code 95621 (patients >83yrs). . See Note 1 . Note 1 . For additional information, please refer to  http://education.Quest[facility].com/faq/FAQ199  (This link is being provided for informational/ educational purposes only.)          Failed - Cr in normal range and within 360 days    Creat  Date Value Ref Range Status  02/09/2023 1.38 (H) 0.50 - 1.05 mg/dL Final         Failed - Mg Level in  normal range and within 360 days    Magnesium   Date Value Ref Range Status  06/04/2022 1.7 1.7 - 2.4 mg/dL Final    Comment:    Performed at [facility], [address] [place], [place] [address]         Failed - Phosphate in normal range and within 360 days    Phosphorus  Date Value Ref Range Status  08/02/2017 4.2 2.5 - 4.5 mg/dL Final         Passed - Ca in normal range and within 360 days    Calcium   Date Value Ref Range Status  02/09/2023 9.8 8.6 - 10.4 mg/dL Final         Passed - eGFR is 30 or above and within 360 days    GFR, Est African American  Date Value Ref Range Status  11/21/2019 57 (L) > OR = 60 mL/min/1.51m2 Final   GFR, Est Non African American  Date Value Ref Range Status  11/21/2019 50 (L) > OR = 60 mL/min/1.32m2 Final   GFR, Estimated  Date Value Ref Range Status  06/04/2022 24 (L) >60 mL/min Final    Comment:    (NOTE) Calculated using the CKD-EPI Creatinine Equation (2021)    eGFR  Date Value Ref Range Status  02/09/2023 42 (L) > OR = 60 mL/min/1.13m2 Final         Passed - Valid encounter within last 12 months    Recent Outpatient Visits           11 years ago Chronic back pain   [place] [facility]   11 years ago Dry eyes, bilateral   [place] [facility], Madge Schiller, MD              Passed - Bone Mineral Density or Dexa Scan completed in the last 2 years

## 2023-08-15 DIAGNOSIS — H26491 Other secondary cataract, right eye: Secondary | ICD-10-CM | POA: Diagnosis not present

## 2023-08-15 DIAGNOSIS — Z961 Presence of intraocular lens: Secondary | ICD-10-CM | POA: Diagnosis not present

## 2023-08-15 DIAGNOSIS — H353131 Nonexudative age-related macular degeneration, bilateral, early dry stage: Secondary | ICD-10-CM | POA: Diagnosis not present

## 2023-08-30 DIAGNOSIS — N2581 Secondary hyperparathyroidism of renal origin: Secondary | ICD-10-CM | POA: Diagnosis not present

## 2023-08-30 DIAGNOSIS — R809 Proteinuria, unspecified: Secondary | ICD-10-CM | POA: Diagnosis not present

## 2023-08-30 DIAGNOSIS — N1832 Chronic kidney disease, stage 3b: Secondary | ICD-10-CM | POA: Diagnosis not present

## 2023-09-04 DIAGNOSIS — N2581 Secondary hyperparathyroidism of renal origin: Secondary | ICD-10-CM | POA: Diagnosis not present

## 2023-09-04 DIAGNOSIS — R809 Proteinuria, unspecified: Secondary | ICD-10-CM | POA: Diagnosis not present

## 2023-09-04 DIAGNOSIS — N1832 Chronic kidney disease, stage 3b: Secondary | ICD-10-CM | POA: Diagnosis not present

## 2023-09-04 NOTE — Progress Notes (Signed)
 Follow Up Visit   Patient Name: April Price, female   Patient DOB: May 24, 1955 Date of Service: 09/04/2023  Patient MRN: [id number] Provider Creating Note: Bonnell Sherry, MD  [id number] Primary Care Physician:   [address] [place] [address] Additional Physicians/ Providers:    History of Present Illness April Price is a 68 y.o. female who is following up today for chronic kidney disease stage IIIb and proteinuria.  The patient's chronic kidney disease appears to be stable most recent EGFR 39 with urine protein to creatinine ratio 0.3.  Patient denies nausea, vomiting, or dysphagia.  In regards to secondary hyperparathyroidism with most recent PTH of 66, phosphorus 4.3, calcium  9.4.     Medications   Current Outpatient Medications:  .  albuterol  HFA (PROVENTIL  HFA;VENTOLIN  HFA) 108 (90 Base) MCG/ACT inhaler, Inhale 2 puffs, Disp: , Rfl:  .  alendronate  (FOSAMAX ) 70 MG tablet, Take 70 mg by mouth, Disp: , Rfl:  .  atorvastatin  (LIPITOR) 10 MG tablet, Take 10 mg by mouth, Disp: , Rfl:  .  azelastine  (ASTELIN ) 0.1 % nasal spray, Administer 2 sprays into affected nostril(s), Disp: , Rfl:  .  cyclobenzaprine  (FLEXERIL ) 5 MG tablet, Take 5 mg by mouth, Disp: , Rfl:  .  EPINEPHrine  (EPIPEN ) 0.3 MG/0.3ML injection syringe, Inject 0.3 mg into the shoulder, thigh, or buttocks, Disp: , Rfl:  .  esomeprazole  (NexIUM ) 40 MG DR capsule, Take 1 tablet by mouth 1 (one) time each day, Disp: , Rfl:  .  fluticasone  (FLONASE ) 50 MCG/ACT nasal spray, Administer 2 sprays into affected nostril(s), Disp: , Rfl:  .  lisinopril  10 MG tablet, Take 10 mg by mouth 1 (one) time each day, Disp: , Rfl:  .  montelukast  (SINGULAIR ) 10 MG tablet, Take 10 mg by mouth, Disp: , Rfl:  .  Multiple Vitamins-Minerals (PreserVision AREDS) tablet, Take 1 capsule by mouth, Disp: , Rfl:  .  traMADol  (ULTRAM ) 50 MG tablet, Take 50 mg by mouth, Disp: , Rfl:    Allergies Acetaminophen, Cephalexin ,  Ipratropium-albuterol , Misoprostol, Morphine and codeine, Naproxen, Penicillins, Tetanus toxoids, Adhesive tape, Armoracia rusticana ext (horseradish), Famotidine, Nickel, Shellfish allergy, Wheat, and Gabapentin   Problem List There is no problem list on file for this patient.    Review of Systems  Constitutional:  Negative for chills and fever.  Respiratory:  Negative for cough and shortness of breath.   Cardiovascular:  Negative for chest pain and palpitations.  Gastrointestinal:  Negative for nausea and vomiting.  Genitourinary:  Negative for dysuria, hematuria and urgency.     History Past Medical History:  Diagnosis Date  . Chronic kidney disease   . Essential hypertension   . Fibromyalgia   . Osteoporosis   . Other and unspecified hyperlipidemia   . Urinary tract infection     History reviewed. No pertinent surgical history. Family History  Family history unknown: Yes   Social History   Tobacco Use  . Smoking status: Every Day    Current packs/day: 0.50    Types: Cigarettes  . Smokeless tobacco: Never  Substance Use Topics  . Alcohol use: Not Currently        Physical Exam  Vitals BP 143/70 (BP Location: Right upper arm, Patient Position: Sitting)   Pulse 91   Temp 97.8 F   Wt 154 lb (69.9 kg)   SpO2 91%   BMI 26.43 kg/m   PHYSICAL EXAM: Limited to visual exam: General: No acute distress  Laboratory Studies  Chemistry  Lab  Units 08/30/23 0834 05/01/23 0849 02/09/23 1434 11/13/22 0933 07/20/22 0845 06/12/22 1346 05/29/22 1408 01/30/22 0856  SODIUM mmol/L 134* 135 137 134* 133* 135 136 138  POTASSIUM mmol/L 4.1 4.6 4.9 4.9 5.0 4.7 4.3 5.4*  CHLORIDE mmol/L 101 101 102 98 101 102 105 108  CO2 mmol/L 25 21 26 24 20 23  18* 18*  CALCIUM  mg/dL 9.4 9.9 9.8 89.7 9.6 9.5 9.4 9.9  PHOSPHORUS mg/dL 4.3 3.9  --  4.6* 3.9 4.0  --   --   ALK PHOS U/L  --   --  84  --   --   --  78 85  PTH pg/mL 66 97*  --  50 75  --   --   --   GLUCOSE mg/dL 84 94  92 85 896* 886* 94 89  ALBUMIN g/dL 3.9 4.2 4.1 4.1 3.8 3.6 3.7 4.1  BUN mg/dL 21 20 18 20  30* 22 25 28*  CREATININE mg/dL 8.51* 8.55* 8.61* 8.46* 1.44* 1.49* 1.65* 1.52*        No lab exists for component: IRON SATURATION, TRANSSATPER  CBC  Lab Units 08/30/23 0834 05/01/23 0849 11/13/22 0933 07/20/22 0845 06/12/22 1346  WBC AUTO Thousand/uL 8.8 9.0 10.4 12.1* 11.2*  HEMOGLOBIN g/dL 85.2 83.4* 84.0* 83.4* 13.0  HEMATOCRIT % 44.5 50.1* 47.8* 47.8* 38.2  MCV fL 90.6 90.3 90.0 89.8 89.7  PLATELETS AUTO Thousand/uL 258 274 292 234 439*    Urine  Lab Units 08/30/23 0834 05/01/23 0849 11/13/22 0933 07/20/22 0845 06/12/22 1346  PROT/CREAT RATIO UR mg/g creat 0.346*  346* 0.364*  364* 0.378*  378*   < >  --   ALB MG/G CREAT UR mcg/mg creat  --   --   --   --  74*   < > = values in this interval not displayed.    Lab Results  Component Value Date   PTH 66 08/30/2023   CALCIUM  9.4 08/30/2023   PHOS 4.3 08/30/2023     Imaging and Other Studies     Orders Placed This Encounter  . Renal Function Panel  . CBC and Differential  . PTH, Intact  . Protein, Total, Random Urine w/Creatinine (Protein/Creat Ratio)       Impression/Recommendations  April Price is a 68 y.o. female with past medical history of GERD, depression, fibromyalgia, chronic pain syndrome who returns today via telehealth in follow-up of chronic kidney disease stage IIIb.  1.  Chronic kidney disease stage IIIb/proteinuria.  Patient has stable chronic kidney disease.  Most recent EGFR 39 with urine protein creatinine ratio 0.3.  Will plan to maintain the patient on lisinopril  for renal protection and proteinuria suppression.  Follow-up renal parameters prior to next visit.  2.  Secondary hyperparathyroidism.  Most recent PTH was 66, phosphorus 4.3, calcium  9.4.  No immediate need for calcitriol phosphorus binders.  Repeat bone metabolism parameters prior to the next visit.  Return in about 4  months (around 01/05/2024).   April Lateef, MD

## 2023-09-06 ENCOUNTER — Encounter: Admitting: Internal Medicine

## 2023-09-06 DIAGNOSIS — M461 Sacroiliitis, not elsewhere classified: Secondary | ICD-10-CM | POA: Diagnosis not present

## 2023-09-11 ENCOUNTER — Ambulatory Visit (INDEPENDENT_AMBULATORY_CARE_PROVIDER_SITE_OTHER): Admitting: Internal Medicine

## 2023-09-11 ENCOUNTER — Encounter: Payer: Self-pay | Admitting: Internal Medicine

## 2023-09-11 VITALS — BP 138/78 | HR 94 | Ht 64.0 in | Wt 152.0 lb

## 2023-09-11 DIAGNOSIS — E663 Overweight: Secondary | ICD-10-CM

## 2023-09-11 DIAGNOSIS — Z0001 Encounter for general adult medical examination with abnormal findings: Secondary | ICD-10-CM | POA: Diagnosis not present

## 2023-09-11 DIAGNOSIS — Z6826 Body mass index (BMI) 26.0-26.9, adult: Secondary | ICD-10-CM

## 2023-09-11 DIAGNOSIS — E782 Mixed hyperlipidemia: Secondary | ICD-10-CM

## 2023-09-11 DIAGNOSIS — Z1231 Encounter for screening mammogram for malignant neoplasm of breast: Secondary | ICD-10-CM

## 2023-09-11 DIAGNOSIS — R7303 Prediabetes: Secondary | ICD-10-CM | POA: Insufficient documentation

## 2023-09-11 LAB — HEMOGLOBIN A1C
Hgb A1c MFr Bld: 5.9 % — ABNORMAL HIGH (ref <5.7)
Mean Plasma Glucose: 123 mg/dL
eAG (mmol/L): 6.8 mmol/L

## 2023-09-11 LAB — LIPID PANEL
Cholesterol: 250 mg/dL — ABNORMAL HIGH (ref <200)
HDL: 68 mg/dL (ref >=50)
LDL Cholesterol (Calc): 163 mg/dL — ABNORMAL HIGH
Non-HDL Cholesterol (Calc): 182 mg/dL — ABNORMAL HIGH (ref <130)
Total CHOL/HDL Ratio: 3.7 (calc) (ref <5.0)
Triglycerides: 85 mg/dL (ref <150)

## 2023-09-11 NOTE — Patient Instructions (Signed)
 Health Maintenance for Postmenopausal Women Menopause is a normal process in which your ability to get pregnant comes to an end. This process happens slowly over many months or years, usually between the ages of 24 and 62. Menopause is complete when you have missed your menstrual period for 12 months. It is important to talk with your health care provider about some of the most common conditions that affect women after menopause (postmenopausal women). These include heart disease, cancer, and bone loss (osteoporosis). Adopting a healthy lifestyle and getting preventive care can help to promote your health and wellness. The actions you take can also lower your chances of developing some of these common conditions. What are the signs and symptoms of menopause? During menopause, you may have the following symptoms: Hot flashes. These can be moderate or severe. Night sweats. Decrease in sex drive. Mood swings. Headaches. Tiredness (fatigue). Irritability. Memory problems. Problems falling asleep or staying asleep. Talk with your health care provider about treatment options for your symptoms. Do I need hormone replacement therapy? Hormone replacement therapy is effective in treating symptoms that are caused by menopause, such as hot flashes and night sweats. Hormone replacement carries certain risks, especially as you become older. If you are thinking about using estrogen or estrogen with progestin, discuss the benefits and risks with your health care provider. How can I reduce my risk for heart disease and stroke? The risk of heart disease, heart attack, and stroke increases as you age. One of the causes may be a change in the body's hormones during menopause. This can affect how your body uses dietary fats, triglycerides, and cholesterol. Heart attack and stroke are medical emergencies. There are many things that you can do to help prevent heart disease and stroke. Watch your blood pressure High  blood pressure causes heart disease and increases the risk of stroke. This is more likely to develop in people who have high blood pressure readings or are overweight. Have your blood pressure checked: Every 3-5 years if you are 50-75 years of age. Every year if you are 77 years old or older. Eat a healthy diet  Eat a diet that includes plenty of vegetables, fruits, low-fat dairy products, and lean protein. Do not eat a lot of foods that are high in solid fats, added sugars, or sodium. Get regular exercise Get regular exercise. This is one of the most important things you can do for your health. Most adults should: Try to exercise for at least 150 minutes each week. The exercise should increase your heart rate and make you sweat (moderate-intensity exercise). Try to do strengthening exercises at least twice each week. Do these in addition to the moderate-intensity exercise. Spend less time sitting. Even light physical activity can be beneficial. Other tips Work with your health care provider to achieve or maintain a healthy weight. Do not use any products that contain nicotine or tobacco. These products include cigarettes, chewing tobacco, and vaping devices, such as e-cigarettes. If you need help quitting, ask your health care provider. Know your numbers. Ask your health care provider to check your cholesterol and your blood sugar (glucose). Continue to have your blood tested as directed by your health care provider. Do I need screening for cancer? Depending on your health history and family history, you may need to have cancer screenings at different stages of your life. This may include screening for: Breast cancer. Cervical cancer. Lung cancer. Colorectal cancer. What is my risk for osteoporosis? After menopause, you may be  at increased risk for osteoporosis. Osteoporosis is a condition in which bone destruction happens more quickly than new bone creation. To help prevent osteoporosis or  the bone fractures that can happen because of osteoporosis, you may take the following actions: If you are 61-3 years old, get at least 1,000 mg of calcium and at least 600 international units (IU) of vitamin D per day. If you are older than age 61 but younger than age 75, get at least 1,200 mg of calcium and at least 600 international units (IU) of vitamin D per day. If you are older than age 62, get at least 1,200 mg of calcium and at least 800 international units (IU) of vitamin D per day. Smoking and drinking excessive alcohol increase the risk of osteoporosis. Eat foods that are rich in calcium and vitamin D, and do weight-bearing exercises several times each week as directed by your health care provider. How does menopause affect my mental health? Depression may occur at any age, but it is more common as you become older. Common symptoms of depression include: Feeling depressed. Changes in sleep patterns. Changes in appetite or eating patterns. Feeling an overall lack of motivation or enjoyment of activities that you previously enjoyed. Frequent crying spells. Talk with your health care provider if you think that you are experiencing any of these symptoms. General instructions See your health care provider for regular wellness exams and vaccines. This may include: Scheduling regular health, dental, and eye exams. Getting and maintaining your vaccines. These include: Influenza vaccine. Get this vaccine each year before the flu season begins. Pneumonia vaccine. Shingles vaccine. Tetanus, diphtheria, and pertussis (Tdap) booster vaccine. Your health care provider may also recommend other immunizations. Tell your health care provider if you have ever been abused or do not feel safe at home. Summary Menopause is a normal process in which your ability to get pregnant comes to an end. This condition causes hot flashes, night sweats, decreased interest in sex, mood swings, headaches, or lack  of sleep. Treatment for this condition may include hormone replacement therapy. Take actions to keep yourself healthy, including exercising regularly, eating a healthy diet, watching your weight, and checking your blood pressure and blood sugar levels. Get screened for cancer and depression. Make sure that you are up to date with all your vaccines. This information is not intended to replace advice given to you by your health care provider. Make sure you discuss any questions you have with your health care provider. Document Revised: 08/16/2020 Document Reviewed: 08/16/2020 Elsevier Patient Education  2024 [employer].

## 2023-09-11 NOTE — Assessment & Plan Note (Signed)
Encouraged diet for weight loss 

## 2023-09-11 NOTE — Progress Notes (Signed)
 Subjective:    Patient ID: April Price, female    DOB: 1956/02/25, 68 y.o.   MRN: [id number]  HPI  Patient presents to clinic today for annual exam.  Flu: 02/2023 Tetanus: Unsure COVID: Pfizer x 5 Pneumovax: 04/2008 Prevnar 20: 07/2021 Shingrix: Had it but unsure of date Pap smear: Hysterectomy Mammogram: 06/2022 Bone density: 06/2022 Colon screening: 02/2020, Cologuard Vision screening: annually Dentist: as needed  Diet: She does eat some meat. She consumes fruits and veggies. She tries to avoid fried foods. She drinks mostly water, juice Exercise: Walking  Review of Systems     Past Medical History:  Diagnosis Date   Allergy    Anxiety    Arthritis    Asthma    Cataract    surgery both eyes in 2011   Chronic fatigue    Depression    Depression screen    Diffuse cystic mastopathy    Fibromyalgia    Fibromyalgia muscle pain    GERD (gastroesophageal reflux disease)    Heart murmur    History of kidney stones    Hyperlipidemia 11/21/2019   Hypertension    driven by stress and pain   Stage 3a chronic kidney disease (HCC) 01/27/2021    Current Outpatient Medications  Medication Sig Dispense Refill   albuterol  (PROVENTIL  HFA;VENTOLIN  HFA) 108 (90 Base) MCG/ACT inhaler Inhale 2 puffs into the lungs every 4 (four) hours as needed for wheezing or shortness of breath. 3 Inhaler 1   alendronate  (FOSAMAX ) 70 MG tablet TAKE 1 TABLET EVERY 7 DAYS WITH A FULL GLASS OF WATER ON AN EMPTY STOMACH DO NOT LIE DOWN FOR AT LEAST 30 MIN 12 tablet 1   azelastine  (ASTELIN ) 0.1 % nasal spray Place 2 sprays into both nostrils 2 (two) times daily as needed for rhinitis. Use in each nostril as directed 30 mL 5   Budeson-Glycopyrrol-Formoterol (BREZTRI  AEROSPHERE) 160-9-4.8 MCG/ACT AERO Inhale 2 puffs into the lungs 2 (two) times daily. 10.7 g 5   cyclobenzaprine  (FLEXERIL ) 5 MG tablet Take 1 tablet (5 mg total) by mouth 2 (two) times daily as needed for muscle spasms. 15 tablet 0    EPINEPHrine  (EPIPEN  2-PAK) 0.3 mg/0.3 mL IJ SOAJ injection Inject 0.3 mLs (0.3 mg total) into the muscle once for 1 dose. 0.3 mL 0   esomeprazole  (NEXIUM ) 40 MG capsule TAKE 1 CAPSULE BY MOUTH ONCE DAILY 90 capsule 1   fluticasone  (FLONASE ) 50 MCG/ACT nasal spray Place 2 sprays into both nostrils daily. 16 g 6   lisinopril  (ZESTRIL ) 5 MG tablet Take 1 tablet (5 mg total) by mouth daily. 90 tablet 1   Multiple Vitamins-Minerals (PRESERVISION AREDS 2 PO) Take 1 capsule by mouth 2 (two) times daily.     traMADol  (ULTRAM ) 50 MG tablet Take 50 mg by mouth every 8 (eight) hours as needed.     No current facility-administered medications for this visit.    Allergies  Allergen Reactions   Cephalexin  Anaphylaxis    Unspecified reaction per patient chart- she was started on Bactrim  instead after 1-2 doses of Keflex    Patient says she had anaphylactic rxn  Unspecified reaction per patient chart- she was started on Bactrim  instead after 1-2 doses of Keflex    Patient says she had anaphylactic rxn   Misoprostol Hives and Itching   Naproxen Hives and Other (See Comments)    Other Reaction: itch   Penicillins Anaphylaxis    Has patient had a PCN reaction causing immediate rash, facial/tongue/throat swelling, SOB  or lightheadedness with hypotension: Yes Has patient had a PCN reaction causing severe rash involving mucus membranes or skin necrosis: No Has patient had a PCN reaction that required hospitalization No Has patient had a PCN reaction occurring within the last 10 years: Yes If all of the above answers are "NO", then may proceed with Cephalosporin use.    Tylenol [Acetaminophen] Anaphylaxis, Itching and Hives    Ears and throat itch    Armoracia Rusticana Ext (Horseradish)    Morphine And Codeine Other (See Comments)    Depression    Nickel Itching   Pepcid [Famotidine] Nausea And Vomiting   Shrimp [Shellfish Allergy] Hives   Tape     irritation   Tetanus Toxoids Other (See Comments)     Swelling at injection site and fever   Wheat    Gabapentin  Other (See Comments)    Makes her feel "spacy" off balance, like she is coming out of anesthesia.     Family History  Problem Relation Age of Onset   Heart disease Mother    Arthritis Mother    Vision loss Mother    Heart disease Father    Stroke Father    Vascular Disease Sister    Heart disease Brother    Colon cancer Neg Hx    Breast cancer Neg Hx     Social History   Socioeconomic History   Marital status: Single    Spouse name: Not on file   Number of children: 0   Years of education: Not on file   Highest education level: Associate degree: occupational, Scientist, product/process development, or vocational program  Occupational History   Occupation: Disability  Tobacco Use   Smoking status: Every Day    Current packs/day: 0.50    Average packs/day: 0.5 packs/day for 40.0 years (20.0 ttl pk-yrs)    Types: Cigarettes   Smokeless tobacco: Never  Vaping Use   Vaping status: Never Used  Substance and Sexual Activity   Alcohol use: Not Currently   Drug use: Never   Sexual activity: Not Currently    Birth control/protection: None  Other Topics Concern   Not on file  Social History Narrative   Difficulty sitting, standing for prolong periods of time.    Social Drivers of Health   Financial Resource Strain: Medium Risk (09/10/2023)   Overall Financial Resource Strain (CARDIA)    Difficulty of Paying Living Expenses: Somewhat hard  Food Insecurity: Food Insecurity Present (09/10/2023)   Hunger Vital Sign    Worried About Running Out of Food in the Last Year: Sometimes true    Ran Out of Food in the Last Year: Sometimes true  Transportation Needs: No Transportation Needs (08/02/2023)   PRAPARE - Administrator, Civil Service (Medical): No    Lack of Transportation (Non-Medical): No  Physical Activity: Inactive (08/02/2023)   Exercise Vital Sign    Days of Exercise per Week: 0 days    Minutes of Exercise per Session: 0  min  Stress: No Stress Concern Present (09/10/2023)   [employer] of Occupational Health - Occupational Stress Questionnaire    Feeling of Stress : Not at all  Social Connections: Moderately Isolated (09/10/2023)   Social Connection and Isolation Panel [NHANES]    Frequency of Communication with Friends and Family: More than three times a week    Frequency of Social Gatherings with Friends and Family: Once a week    Attends Religious Services: More than 4 times per year  Active Member of Clubs or Organizations: No    Attends Banker Meetings: Never    Marital Status: Never married  Intimate Partner Violence: Not At Risk (08/02/2023)   Humiliation, Afraid, Rape, and Kick questionnaire    Fear of Current or Ex-Partner: No    Emotionally Abused: No    Physically Abused: No    Sexually Abused: No     Constitutional: Patient reports fatigue.  Denies fever, malaise, headache or abrupt weight changes.  HEENT: Denies eye pain, eye redness, ear pain, ringing in the ears, wax buildup, runny nose, nasal congestion, bloody nose, or sore throat. Respiratory: Patient reports intermittent shortness of breath.  Denies difficulty breathing, cough or sputum production.   Cardiovascular: Denies chest pain, chest tightness, palpitations or swelling in the hands or feet.  Gastrointestinal: Denies abdominal pain, bloating, constipation, diarrhea or blood in the stool.  GU: Denies urgency, frequency, pain with urination, burning sensation, blood in urine, odor or discharge. Musculoskeletal: Patient reports chronic joint, muscle pain, difficulty with gait.  Denies decrease in range of motion, or joint swelling.  Skin: Denies redness, rashes, lesions or ulcercations.  Neurological: Denies dizziness, difficulty with memory, difficulty with speech or problems with balance and coordination.  Psych: Patient has a history of depression.  Denies anxiety, SI/HI.  No other specific complaints in a  complete review of systems (except as listed in HPI above).   Objective:   Physical Exam BP 138/78 Comment: home reading  Pulse 94   Ht 5\' 4"  (1.626 m)   Wt 152 lb (68.9 kg)   SpO2 99%   BMI 26.09 kg/m    Wt Readings from Last 3 Encounters:  08/02/23 158 lb (71.7 kg)  02/09/23 158 lb (71.7 kg)  08/07/22 152 lb (68.9 kg)    General: Appears her stated age, overweight, in NAD. Skin: Warm, dry and intact.  Senile purpura noted. HEENT: Head: normal shape and size; Eyes: sclera white, no icterus, conjunctiva pink, PERRLA and EOMs intact;  Cardiovascular: Normal rate and rhythm. S1,S2 noted.  No murmur, rubs or gallops noted. No JVD or BLE edema. No carotid bruits noted. Pulmonary/Chest: Normal effort and positive vesicular breath sounds. No respiratory distress. No wheezes, rales or ronchi noted.  Abdomen: Normal bowel sounds.  Musculoskeletal: Strength 4/5 BUE/BLE.  Gait slow and steady with use of rolling walker. Neurological: Alert and oriented. Cranial nerves II-XII grossly intact. Coordination normal.  Psychiatric: Mood and affect normal. Behavior is normal. Judgment and thought content normal.     BMET    Component Value Date/Time   NA 137 02/09/2023 1434   K 4.9 02/09/2023 1434   CL 102 02/09/2023 1434   CO2 26 02/09/2023 1434   GLUCOSE 92 02/09/2023 1434   BUN 18 02/09/2023 1434   CREATININE 1.38 (H) 02/09/2023 1434   CALCIUM  9.8 02/09/2023 1434   GFRNONAA 24 (L) 06/04/2022 0441   GFRNONAA 50 (L) 11/21/2019 0829   GFRAA 57 (L) 11/21/2019 0829    Lipid Panel     Component Value Date/Time   CHOL 226 (H) 02/09/2023 1434   TRIG 105 02/09/2023 1434   HDL 61 02/09/2023 1434   CHOLHDL 3.7 02/09/2023 1434   LDLCALC 143 (H) 02/09/2023 1434    CBC    Component Value Date/Time   WBC 9.6 02/09/2023 1434   RBC 5.19 (H) 02/09/2023 1434   HGB 15.7 (H) 02/09/2023 1434   HCT 47.2 (H) 02/09/2023 1434   PLT 273 02/09/2023 1434   MCV  90.9 02/09/2023 1434   MCH 30.3  02/09/2023 1434   MCHC 33.3 02/09/2023 1434   RDW 12.8 02/09/2023 1434   LYMPHSABS 618 (L) 05/29/2022 1408   MONOABS 0.6 06/01/2016 0901   EOSABS 0 (L) 05/29/2022 1408   BASOSABS 124 05/29/2022 1408    Hgb A1C Lab Results  Component Value Date   HGBA1C 6.1 (H) 02/09/2023           Assessment & Plan:   Preventative Health Maintenance:  Encouraged her to get a flu shot in the fall She declines tetanus for financial reasons, advised her if she gets bit or cut to go get this done Prevnar 20 UTD Encouraged her to get her COVID booster Discussed Shingrix vaccine, she will check coverage with her insurance company and schedule visit if she would like to have this done She no longer needs Pap smears Mammogram ordered-she will call to schedule Bone density UTD She declines to repeat Cologuard at this time She declines lung cancer screening Encouraged her to consume a balanced diet and exercise regimen Advised her to see an eye doctor and dentist annually We will check lipid, A1c today  RTC in 6 months for follow-up of chronic conditions Helayne Lo, NP

## 2023-09-12 ENCOUNTER — Ambulatory Visit: Payer: Self-pay | Admitting: Internal Medicine

## 2023-09-18 ENCOUNTER — Other Ambulatory Visit: Payer: Self-pay | Admitting: Internal Medicine

## 2023-09-18 DIAGNOSIS — I10 Essential (primary) hypertension: Secondary | ICD-10-CM

## 2023-09-19 NOTE — Telephone Encounter (Signed)
 Requested medication (s) are due for refill today: yes  Requested medication (s) are on the active medication list: yes   Last refill:  02/09/23 #90 1 refills  Future visit scheduled: no   Notes to clinic:  protocol failed last labs 02/09/23.do you want to refill Rx?     Requested Prescriptions  Pending Prescriptions Disp Refills   lisinopril  (ZESTRIL ) 5 MG tablet [Pharmacy Med Name: LISINOPRIL  5 MG TAB] 90 tablet 1    Sig: TAKE 1 TABLET BY MOUTH ONCE DAILY     Cardiovascular:  ACE Inhibitors Failed - 09/19/2023  3:36 PM      Failed - Cr in normal range and within 180 days    Creat  Date Value Ref Range Status  02/09/2023 1.38 (H) 0.50 - 1.05 mg/dL Final         Failed - K in normal range and within 180 days    Potassium  Date Value Ref Range Status  02/09/2023 4.9 3.5 - 5.3 mmol/L Final         Passed - Patient is not pregnant      Passed - Last BP in normal range    BP Readings from Last 1 Encounters:  09/11/23 138/78         Passed - Valid encounter within last 6 months    Recent Outpatient Visits           1 week ago Encounter for general adult medical examination with abnormal findings   [facility], Rankin Buzzard, NP   11 years ago Chronic back pain   [facility] at [facility] [place] Dora Gallo, MD   11 years ago Dry eyes, bilateral   [facility] Primary Care & [facility] Medicine at [facility] Dora Gallo, MD

## 2023-11-12 ENCOUNTER — Other Ambulatory Visit: Payer: Self-pay | Admitting: Internal Medicine

## 2023-11-12 DIAGNOSIS — K219 Gastro-esophageal reflux disease without esophagitis: Secondary | ICD-10-CM

## 2023-11-13 NOTE — Telephone Encounter (Signed)
 Requested Prescriptions  Pending Prescriptions Disp Refills   esomeprazole  (NEXIUM ) 40 MG capsule [Pharmacy Med Name: ESOMEPRAZOLE  MAGNESIUM  40 MG CAP] 90 capsule 1    Sig: TAKE 1 CAPSULE BY MOUTH ONCE DAILY     Gastroenterology: Proton Pump Inhibitors 2 Passed - 11/13/2023 11:32 AM      Passed - ALT in normal range and within 360 days    ALT  Date Value Ref Range Status  02/09/2023 14 6 - 29 U/L Final         Passed - AST in normal range and within 360 days    AST  Date Value Ref Range Status  02/09/2023 13 10 - 35 U/L Final         Passed - Valid encounter within last 12 months    Recent Outpatient Visits           2 months ago Encounter for general adult medical examination with abnormal findings   [facility] [place], Angeline ORN, NP   11 years ago Chronic back pain   [facility] Primary Care & Sports Medicine at [facility] Bonni Desiderio Beagle, MD   11 years ago Dry eyes, bilateral   [place] [facility] & [facility] Medicine at [facility] Bonni Desiderio, Beagle, MD               alendronate  (FOSAMAX ) 70 MG tablet [Pharmacy Med Name: ALENDRONATE  SODIUM 70 MG TAB] 12 tablet 1    Sig: TAKE 1 TABLET EVERY 7 DAYS WITH A FULL GLASS OF WATER ON AN EMPTY STOMACH DO NOT LIE DOWN FOR AT LEAST 30 MIN     Endocrinology:  Bisphosphonates Failed - 11/13/2023 11:32 AM      Failed - Vitamin D  in normal range and within 360 days    25-Hydroxy, Vitamin D -3  Date Value Ref Range Status  02/11/2016 18 ng/mL Final    Comment:    (NOTE) Performed At: [facility] [address] [place], [place] [id number] Pepkowitz Jayson DEL MD Ey:[phone]    25-Hydroxy, Vitamin D -2  Date Value Ref Range Status  02/11/2016 <1.0 ng/mL Final   25-Hydroxy, Vitamin D   Date Value Ref Range Status  02/11/2016 18 (L) ng/mL Final    Comment:    (NOTE) Reference Range: All Ages: Target levels 30 - 100    Vit D, 25-Hydroxy  Date Value Ref Range  Status  01/30/2022 10 (L) 30 - 100 ng/mL Final    Comment:    Vitamin D  Status         25-OH Vitamin D : . Deficiency:                    <20 ng/mL Insufficiency:             20 - 29 ng/mL Optimal:                 > or = 30 ng/mL . For 25-OH Vitamin D  testing on patients on  D2-supplementation and patients for whom quantitation  of D2 and D3 fractions is required, the [facility](TM) 25-OH VIT D, (D2,D3), LC/MS/MS is recommended: order  code 07111 (patients >36yrs). . See Note 1 . Note 1 . For additional information, please refer to  http://education.[facility].com/faq/FAQ199  (This link is being provided for informational/ educational purposes only.)          Failed - Cr in normal range and within 360 days    Creat  Date Value Ref  Range Status  02/09/2023 1.38 (H) 0.50 - 1.05 mg/dL Final         Failed - Mg Level in normal range and within 360 days    Magnesium   Date Value Ref Range Status  06/04/2022 1.7 1.7 - 2.4 mg/dL Final    Comment:    Performed at [facility], [address] [place], [place] [address]         Failed - Phosphate in normal range and within 360 days    Phosphorus  Date Value Ref Range Status  08/02/2017 4.2 2.5 - 4.5 mg/dL Final         Passed - Ca in normal range and within 360 days    Calcium   Date Value Ref Range Status  02/09/2023 9.8 8.6 - 10.4 mg/dL Final         Passed - eGFR is 30 or above and within 360 days    GFR, Est African American  Date Value Ref Range Status  11/21/2019 57 (L) > OR = 60 mL/min/1.75m2 Final   GFR, Est Non African American  Date Value Ref Range Status  11/21/2019 50 (L) > OR = 60 mL/min/1.41m2 Final   GFR, Estimated  Date Value Ref Range Status  06/04/2022 24 (L) >60 mL/min Final    Comment:    (NOTE) Calculated using the CKD-EPI Creatinine Equation (2021)    eGFR  Date Value Ref Range Status  02/09/2023 42 (L) > OR = 60 mL/min/1.39m2 Final         Passed - Valid encounter  within last 12 months    Recent Outpatient Visits           2 months ago Encounter for general adult medical examination with abnormal findings   [place] [facility] [place], Angeline ORN, NP   11 years ago Chronic back pain   [facility] Health Primary Care & Sports Medicine at [facility] Bonni Desiderio Beagle, MD   11 years ago Dry eyes, bilateral   [place] Primary Care & Sports Medicine at [facility] Bonni Desiderio, Beagle, MD              Passed - Bone Mineral Density or Dexa Scan completed in the last 2 years

## 2023-12-13 DIAGNOSIS — M461 Sacroiliitis, not elsewhere classified: Secondary | ICD-10-CM | POA: Diagnosis not present

## 2024-01-10 DIAGNOSIS — M461 Sacroiliitis, not elsewhere classified: Secondary | ICD-10-CM | POA: Diagnosis not present

## 2024-01-10 DIAGNOSIS — F1120 Opioid dependence, uncomplicated: Secondary | ICD-10-CM | POA: Diagnosis not present

## 2024-01-16 DIAGNOSIS — N189 Chronic kidney disease, unspecified: Secondary | ICD-10-CM | POA: Diagnosis not present

## 2024-01-16 DIAGNOSIS — N1832 Chronic kidney disease, stage 3b: Secondary | ICD-10-CM | POA: Diagnosis not present

## 2024-01-16 DIAGNOSIS — D631 Anemia in chronic kidney disease: Secondary | ICD-10-CM | POA: Diagnosis not present

## 2024-01-16 DIAGNOSIS — R809 Proteinuria, unspecified: Secondary | ICD-10-CM | POA: Diagnosis not present

## 2024-01-16 DIAGNOSIS — N2581 Secondary hyperparathyroidism of renal origin: Secondary | ICD-10-CM | POA: Diagnosis not present

## 2024-01-21 DIAGNOSIS — R809 Proteinuria, unspecified: Secondary | ICD-10-CM | POA: Diagnosis not present

## 2024-01-21 DIAGNOSIS — N1832 Chronic kidney disease, stage 3b: Secondary | ICD-10-CM | POA: Diagnosis not present

## 2024-01-21 DIAGNOSIS — N2581 Secondary hyperparathyroidism of renal origin: Secondary | ICD-10-CM | POA: Diagnosis not present

## 2024-03-03 DIAGNOSIS — H16223 Keratoconjunctivitis sicca, not specified as Sjogren's, bilateral: Secondary | ICD-10-CM | POA: Diagnosis not present

## 2024-03-03 DIAGNOSIS — H353131 Nonexudative age-related macular degeneration, bilateral, early dry stage: Secondary | ICD-10-CM | POA: Diagnosis not present

## 2024-03-03 DIAGNOSIS — Z961 Presence of intraocular lens: Secondary | ICD-10-CM | POA: Diagnosis not present

## 2024-03-13 ENCOUNTER — Ambulatory Visit: Admitting: Internal Medicine

## 2024-03-13 VITALS — BP 102/64 | Ht 64.0 in | Wt 149.0 lb

## 2024-03-13 DIAGNOSIS — I10 Essential (primary) hypertension: Secondary | ICD-10-CM

## 2024-03-13 DIAGNOSIS — R7303 Prediabetes: Secondary | ICD-10-CM

## 2024-03-13 DIAGNOSIS — K219 Gastro-esophageal reflux disease without esophagitis: Secondary | ICD-10-CM

## 2024-03-13 DIAGNOSIS — F331 Major depressive disorder, recurrent, moderate: Secondary | ICD-10-CM | POA: Diagnosis not present

## 2024-03-13 DIAGNOSIS — M810 Age-related osteoporosis without current pathological fracture: Secondary | ICD-10-CM | POA: Diagnosis not present

## 2024-03-13 DIAGNOSIS — I700 Atherosclerosis of aorta: Secondary | ICD-10-CM | POA: Diagnosis not present

## 2024-03-13 DIAGNOSIS — J410 Simple chronic bronchitis: Secondary | ICD-10-CM

## 2024-03-13 DIAGNOSIS — N183 Chronic kidney disease, stage 3 unspecified: Secondary | ICD-10-CM | POA: Diagnosis not present

## 2024-03-13 DIAGNOSIS — G9332 Myalgic encephalomyelitis/chronic fatigue syndrome: Secondary | ICD-10-CM | POA: Diagnosis not present

## 2024-03-13 DIAGNOSIS — E663 Overweight: Secondary | ICD-10-CM | POA: Diagnosis not present

## 2024-03-13 DIAGNOSIS — G894 Chronic pain syndrome: Secondary | ICD-10-CM

## 2024-03-13 DIAGNOSIS — Z23 Encounter for immunization: Secondary | ICD-10-CM | POA: Diagnosis not present

## 2024-03-13 DIAGNOSIS — Z789 Other specified health status: Secondary | ICD-10-CM

## 2024-03-13 DIAGNOSIS — E782 Mixed hyperlipidemia: Secondary | ICD-10-CM | POA: Diagnosis not present

## 2024-03-13 DIAGNOSIS — M797 Fibromyalgia: Secondary | ICD-10-CM

## 2024-03-13 DIAGNOSIS — D751 Secondary polycythemia: Secondary | ICD-10-CM

## 2024-03-13 DIAGNOSIS — J449 Chronic obstructive pulmonary disease, unspecified: Secondary | ICD-10-CM | POA: Diagnosis not present

## 2024-03-13 DIAGNOSIS — N1832 Chronic kidney disease, stage 3b: Secondary | ICD-10-CM

## 2024-03-13 MED ORDER — BREZTRI AEROSPHERE 160-9-4.8 MCG/ACT IN AERO: 2.0000 | INHALATION_SPRAY | Freq: Two times a day (BID) | RESPIRATORY_TRACT | 5 refills | Status: AC

## 2024-03-13 MED ORDER — LISINOPRIL 5 MG PO TABS: 5.0000 mg | ORAL_TABLET | Freq: Every day | ORAL | 1 refills | Status: AC

## 2024-03-13 NOTE — Assessment & Plan Note (Signed)
 Continue tramadol  50 mg 3 times daily as needed and cyclobenzaprine  5 mg twice daily as needed per pain management

## 2024-03-13 NOTE — Assessment & Plan Note (Signed)
 On the low end of normal but she is asymptomatic Continue lisinopril  5 mg daily Reinforced DASH diet Kidney function reviewed

## 2024-03-13 NOTE — Assessment & Plan Note (Signed)
Not medicated Support offered 

## 2024-03-13 NOTE — Assessment & Plan Note (Signed)
 Kidney function reviewed Continue lisinopril  5 mg daily for renal protection Will have her continue to follow with nephrology

## 2024-03-13 NOTE — Patient Instructions (Signed)

## 2024-03-13 NOTE — Assessment & Plan Note (Signed)
 A1c today Encourage low-carb diet and exercise for weight loss

## 2024-03-13 NOTE — Assessment & Plan Note (Signed)
 CBC reviewed Encouraged smoking cessation or monthly blood donation Will monitor

## 2024-03-13 NOTE — Assessment & Plan Note (Addendum)
 Lipid profile today She has been statin intolerant to multiple medications Discussed Repatha injections but she would like to think about this before deciding today Encourage low-fat diet

## 2024-03-13 NOTE — Assessment & Plan Note (Signed)
 Continue vitamin D , advised her to start calcium  600 mg daily Continue alendronate  70 mg once weekly Encouraged daily weightbearing exercise

## 2024-03-13 NOTE — Progress Notes (Signed)
 Subjective:    Patient ID: April Price, female    DOB: [birth year], 68 y.o.   MRN: [id number]  HPI  Patient presents to clinic today for 41-month follow-up of chronic conditions.  HTN: Her BP today is 102/64.  She is taking lisinopril  only as needed.  ECG from 05/2022 reviewed.  GERD: She thinks this is triggered by her medications.  She denies breakthrough on esomeprazole .  There is no upper GI on file.  Major depressive disorder (recurrent, moderate) : Chronic however she is not currently taking any medications for this but has been on buspirone  in the past.  She is not currently seeing a therapist.  She denies SI/HI.  Chronic pain/fatigue/fibromyalgia: She reports she has currently been in a flare for the past 2 months.  Managed with tramadol  and cyclobenzaprine  as prescribed by pain management.  COPD: She denies chronic cough or shortness of breath. She is not using breztri  as prescribed but is using albuterol  only as needed. She does continue to smoke. There are no PFT's of file. She does not follow with pulmonology.  HLD with aortic atherosclerosis: Her last LDL was 163, triglycerides 85, 08/2023.  She has been unable to tolerate atorvastatin , pravastatin  and ezetimibe .  She is not taking any aspirin due to her chronic kidney disease.  She does not consume a low-fat diet.  Osteoporosis: She is taking alendronate , and vit d but is not taking calcium  OTC.  She does not get weightbearing exercise.  Bone density from 06/2022 reviewed.  CKD 3: Her last creatinine was 1.37, GFR 42, 01/2024.  She is on lisinopril  for renal protection.  She follows with nephrology.  Polycythemia: Her last H/H was 15.7/47.2, 08/2023.  She does smoke.  She does not follow with hematology.  Prediabetes: Her last A1c was 5.9%, 08/2023.  She is not taking any oral diabetic medication this time.  She does not check her sugars.  Review of Systems     Past Medical History:  Diagnosis Date   Allergy     Anxiety    Arthritis    Asthma    Cataract    surgery both eyes in 2011   Chronic fatigue    Depression    Depression screen    Diffuse cystic mastopathy    Fibromyalgia    Fibromyalgia muscle pain    GERD (gastroesophageal reflux disease)    Heart murmur    History of kidney stones    Hyperlipidemia 11/21/2019   Hypertension    driven by stress and pain   Stage 3a chronic kidney disease (HCC) 01/27/2021    Current Outpatient Medications  Medication Sig Dispense Refill   albuterol  (PROVENTIL  HFA;VENTOLIN  HFA) 108 (90 Base) MCG/ACT inhaler Inhale 2 puffs into the lungs every 4 (four) hours as needed for wheezing or shortness of breath. 3 Inhaler 1   alendronate  (FOSAMAX ) 70 MG tablet TAKE 1 TABLET EVERY 7 DAYS WITH A FULL GLASS OF WATER ON AN EMPTY STOMACH DO NOT LIE DOWN FOR AT LEAST 30 MIN 12 tablet 1   azelastine  (ASTELIN ) 0.1 % nasal spray Place 2 sprays into both nostrils 2 (two) times daily as needed for rhinitis. Use in each nostril as directed 30 mL 5   Budeson-Glycopyrrol-Formoterol (BREZTRI  AEROSPHERE) 160-9-4.8 MCG/ACT AERO Inhale 2 puffs into the lungs 2 (two) times daily. (Patient not taking: Reported on 09/11/2023) 10.7 g 5   cyclobenzaprine  (FLEXERIL ) 5 MG tablet Take 1 tablet (5 mg total) by mouth 2 (two) times daily as  needed for muscle spasms. 15 tablet 0   EPINEPHrine  (EPIPEN  2-PAK) 0.3 mg/0.3 mL IJ SOAJ injection Inject 0.3 mLs (0.3 mg total) into the muscle once for 1 dose. 0.3 mL 0   esomeprazole  (NEXIUM ) 40 MG capsule TAKE 1 CAPSULE BY MOUTH ONCE DAILY 90 capsule 1   fluticasone  (FLONASE ) 50 MCG/ACT nasal spray Place 2 sprays into both nostrils daily. 16 g 6   lisinopril  (ZESTRIL ) 5 MG tablet TAKE 1 TABLET BY MOUTH ONCE DAILY 90 tablet 1   Multiple Vitamins-Minerals (PRESERVISION AREDS 2 PO) Take 1 capsule by mouth 2 (two) times daily.     traMADol  (ULTRAM ) 50 MG tablet Take 50 mg by mouth every 8 (eight) hours as needed.     No current facility-administered  medications for this visit.    Allergies  Allergen Reactions   Cephalexin  Anaphylaxis    Unspecified reaction per patient chart- she was started on Bactrim  instead after 1-2 doses of Keflex    Patient says she had anaphylactic rxn  Unspecified reaction per patient chart- she was started on Bactrim  instead after 1-2 doses of Keflex    Patient says she had anaphylactic rxn   Misoprostol Hives and Itching   Naproxen Hives and Other (See Comments)    Other Reaction: itch   Penicillins Anaphylaxis    Has patient had a PCN reaction causing immediate rash, facial/tongue/throat swelling, SOB or lightheadedness with hypotension: Yes Has patient had a PCN reaction causing severe rash involving mucus membranes or skin necrosis: No Has patient had a PCN reaction that required hospitalization No Has patient had a PCN reaction occurring within the last 10 years: Yes If all of the above answers are NO, then may proceed with Cephalosporin use.    Tylenol [Acetaminophen] Anaphylaxis, Itching and Hives    Ears and throat itch    Armoracia Rusticana Ext (Horseradish)    Morphine And Codeine Other (See Comments)    Depression    Nickel Itching   Pepcid [Famotidine] Nausea And Vomiting   Shrimp [Shellfish Allergy] Hives   Tape     irritation   Tetanus Toxoid-Containing Vaccines Other (See Comments)    Swelling at injection site and fever   Wheat    Gabapentin  Other (See Comments)    Makes her feel spacy off balance, like she is coming out of anesthesia.     Family History  Problem Relation Age of Onset   Heart disease Mother    Arthritis Mother    Vision loss Mother    Heart disease Father    Stroke Father    Vascular Disease Sister    Heart disease Brother    Colon cancer Neg Hx    Breast cancer Neg Hx     Social History   Socioeconomic History   Marital status: Single    Spouse name: Not on file   Number of children: 0   Years of education: Not on file   Highest education  level: Associate degree: occupational, scientist, product/process development, or vocational program  Occupational History   Occupation: Disability  Tobacco Use   Smoking status: Every Day    Current packs/day: 0.50    Average packs/day: 0.5 packs/day for 40.0 years (20.0 ttl pk-yrs)    Types: Cigarettes   Smokeless tobacco: Never  Vaping Use   Vaping status: Never Used  Substance and Sexual Activity   Alcohol use: Not Currently   Drug use: Never   Sexual activity: Not Currently    Birth control/protection: None  Other Topics  Concern   Not on file  Social History Narrative   Difficulty sitting, standing for prolong periods of time.    Social Drivers of Health   Financial Resource Strain: Medium Risk (09/10/2023)   Overall Financial Resource Strain (CARDIA)    Difficulty of Paying Living Expenses: Somewhat hard  Food Insecurity: Food Insecurity Present (03/13/2024)   Hunger Vital Sign    Worried About Running Out of Food in the Last Year: Sometimes true    Ran Out of Food in the Last Year: Never true  Transportation Needs: No Transportation Needs (03/13/2024)   PRAPARE - Administrator, Civil Service (Medical): No    Lack of Transportation (Non-Medical): No  Physical Activity: Inactive (03/13/2024)   Exercise Vital Sign    Days of Exercise per Week: 0 days    Minutes of Exercise per Session: Not on file  Stress: No Stress Concern Present (03/13/2024)   [employer] of Occupational Health - Occupational Stress Questionnaire    Feeling of Stress: Not at all  Social Connections: Unknown (03/13/2024)   Social Connection and Isolation Panel    Frequency of Communication with Friends and Family: More than three times a week    Frequency of Social Gatherings with Friends and Family: Once a week    Attends Religious Services: 1 to 4 times per year    Active Member of [employer] or Organizations: Not on file    Attends Banker Meetings: Not on file    Marital Status: Never married   Intimate Partner Violence: Not At Risk (08/02/2023)   Humiliation, Afraid, Rape, and Kick questionnaire    Fear of Current or Ex-Partner: No    Emotionally Abused: No    Physically Abused: No    Sexually Abused: No     Constitutional: Patient reports fatigue.  Denies fever, malaise, headache or abrupt weight changes.  HEENT: Denies eye pain, eye redness, ear pain, ringing in the ears, wax buildup, runny nose, nasal congestion, bloody nose, or sore throat. Respiratory: Denies difficulty breathing, shortness of breath, cough or sputum production.   Cardiovascular: Denies chest pain, chest tightness, palpitations or swelling in the hands or feet.  Gastrointestinal: Denies abdominal pain, bloating, constipation, diarrhea or blood in the stool.  GU: Denies urgency, frequency, pain with urination, burning sensation, blood in urine, odor or discharge. Musculoskeletal: Patient reports chronic joint and muscle pain, difficulty with gait.  Denies decrease in range of motion, or joint swelling.  Skin: Denies redness, rashes, lesions or ulcercations.  Neurological: Denies dizziness, difficulty with memory, difficulty with speech or problems with balance and coordination.  Psych: Patient has a history of depression.  Denies anxiety, SI/HI.  No other specific complaints in a complete review of systems (except as listed in HPI above).  Objective:   Physical Exam BP 102/64 (BP Location: Left Arm, Patient Position: Sitting, Cuff Size: Normal)   Ht 5' 4 (1.626 m)   Wt 149 lb (67.6 kg)   BMI 25.58 kg/m   Wt Readings from Last 3 Encounters:  09/11/23 152 lb (68.9 kg)  08/02/23 158 lb (71.7 kg)  02/09/23 158 lb (71.7 kg)    General: Appears her stated age, overweight, chronically ill-appearing, in NAD. Skin: Warm, dry and intact.  Senile purpura noted of BUE. HEENT: Head: normal shape and size; Eyes: sclera white, no icterus, conjunctiva pink, PERRLA and EOMs intact; Ears: Tm's gray and intact,  normal light reflex; Nose: mucosa pink and moist, septum midline; Throat/Mouth: Teeth present,  mucosa pink and moist, no exudate, lesions or ulcerations noted.  Cardiovascular: Normal rate and rhythm. S1,S2 noted.  No murmur, rubs or gallops noted. No JVD or BLE edema. No carotid bruits noted. Pulmonary/Chest: Normal effort and positive vesicular breath sounds. No respiratory distress. No wheezes, rales or ronchi noted.  Abdomen: Soft and nontender. Normal bowel sounds.  Musculoskeletal: Gait slow and steady with use of rolling walker. Neurological: Alert and oriented. Cranial nerves II-XII grossly intact. Coordination normal.  Psychiatric: Mood and affect normal. Behavior is normal. Judgment and thought content normal.     BMET    Component Value Date/Time   NA 137 02/09/2023 1434   K 4.9 02/09/2023 1434   CL 102 02/09/2023 1434   CO2 26 02/09/2023 1434   GLUCOSE 92 02/09/2023 1434   BUN 18 02/09/2023 1434   CREATININE 1.38 (H) 02/09/2023 1434   CALCIUM  9.8 02/09/2023 1434   GFRNONAA 24 (L) 06/04/2022 0441   GFRNONAA 50 (L) 11/21/2019 0829   GFRAA 57 (L) 11/21/2019 0829    Lipid Panel     Component Value Date/Time   CHOL 250 (H) 09/11/2023 0855   TRIG 85 09/11/2023 0855   HDL 68 09/11/2023 0855   CHOLHDL 3.7 09/11/2023 0855   LDLCALC 163 (H) 09/11/2023 0855    CBC    Component Value Date/Time   WBC 9.6 02/09/2023 1434   RBC 5.19 (H) 02/09/2023 1434   HGB 15.7 (H) 02/09/2023 1434   HCT 47.2 (H) 02/09/2023 1434   PLT 273 02/09/2023 1434   MCV 90.9 02/09/2023 1434   MCH 30.3 02/09/2023 1434   MCHC 33.3 02/09/2023 1434   RDW 12.8 02/09/2023 1434   LYMPHSABS 618 (L) 05/29/2022 1408   MONOABS 0.6 06/01/2016 0901   EOSABS 0 (L) 05/29/2022 1408   BASOSABS 124 05/29/2022 1408    Hgb A1C Lab Results  Component Value Date   HGBA1C 5.9 (H) 09/11/2023           Assessment & Plan:     RTC in 6 months for your annual exam Angeline Laura, NP

## 2024-03-13 NOTE — Assessment & Plan Note (Signed)
 Lipid profile today She is statin intolerant Discussed repatha injections however she would like to consider this before deciding today She is unable to take aspirin due to chronic kidney disease

## 2024-03-13 NOTE — Assessment & Plan Note (Signed)
Encouraged diet for weight loss 

## 2024-03-13 NOTE — Assessment & Plan Note (Signed)
 Encourage smoking cessation Advised her to start taking breztri  160 - 9 - 4 0.5 g per actuation twice daily as prescribed Continue albuterol  108 mcg per actuation every 4-6 hours as needed

## 2024-03-13 NOTE — Assessment & Plan Note (Signed)
 Tried to avoid things trigger reflux Encourage weight loss as this can help reduce reflux symptoms Continue esomeprazole  40 mg daily

## 2024-03-13 NOTE — Assessment & Plan Note (Signed)
Encouraged regular physical activity ?

## 2024-03-13 NOTE — Assessment & Plan Note (Signed)
 Lipid profile today Has failed multiple statins Discussed Repatha injections however she would like to consider this before making a decision today

## 2024-03-14 ENCOUNTER — Ambulatory Visit: Payer: Self-pay | Admitting: Internal Medicine

## 2024-03-14 LAB — LIPID PANEL
Cholesterol: 206 mg/dL — ABNORMAL HIGH (ref <200)
HDL: 50 mg/dL (ref >=50)
LDL Cholesterol (Calc): 132 mg/dL — ABNORMAL HIGH
Non-HDL Cholesterol (Calc): 156 mg/dL — ABNORMAL HIGH (ref <130)
Total CHOL/HDL Ratio: 4.1 (calc) (ref <5.0)
Triglycerides: 128 mg/dL (ref <150)

## 2024-03-14 LAB — HEMOGLOBIN A1C
Hgb A1c MFr Bld: 5.8 % — ABNORMAL HIGH (ref <5.7)
Mean Plasma Glucose: 120 mg/dL
eAG (mmol/L): 6.6 mmol/L

## 2024-03-17 DIAGNOSIS — M461 Sacroiliitis, not elsewhere classified: Secondary | ICD-10-CM | POA: Diagnosis not present

## 2024-08-08 ENCOUNTER — Ambulatory Visit

## 2024-08-20 ENCOUNTER — Ambulatory Visit

## 2024-09-11 ENCOUNTER — Encounter: Admitting: Internal Medicine
